# Patient Record
Sex: Female | Born: 1940 | Race: White | Hispanic: No | Smoking: Current some day smoker
Health system: Southern US, Community
[De-identification: ages and names within clinical notes are randomized; demographics above are authoritative.]

## PROBLEM LIST (undated history)

## (undated) DIAGNOSIS — E119 Type 2 diabetes mellitus without complications: Secondary | ICD-10-CM

## (undated) DIAGNOSIS — I10 Essential (primary) hypertension: Secondary | ICD-10-CM

## (undated) DIAGNOSIS — D509 Iron deficiency anemia, unspecified: Secondary | ICD-10-CM

## (undated) DIAGNOSIS — K219 Gastro-esophageal reflux disease without esophagitis: Secondary | ICD-10-CM

## (undated) DIAGNOSIS — J439 Emphysema, unspecified: Secondary | ICD-10-CM

## (undated) DIAGNOSIS — M549 Dorsalgia, unspecified: Secondary | ICD-10-CM

## (undated) DIAGNOSIS — R569 Unspecified convulsions: Secondary | ICD-10-CM

## (undated) DIAGNOSIS — K649 Unspecified hemorrhoids: Secondary | ICD-10-CM

## (undated) DIAGNOSIS — E785 Hyperlipidemia, unspecified: Secondary | ICD-10-CM

## (undated) DIAGNOSIS — J45909 Unspecified asthma, uncomplicated: Secondary | ICD-10-CM

## (undated) DIAGNOSIS — K589 Irritable bowel syndrome without diarrhea: Secondary | ICD-10-CM

## (undated) DIAGNOSIS — IMO0002 Reserved for concepts with insufficient information to code with codable children: Secondary | ICD-10-CM

## (undated) DIAGNOSIS — K31819 Angiodysplasia of stomach and duodenum without bleeding: Secondary | ICD-10-CM

## (undated) DIAGNOSIS — E240 Pituitary-dependent Cushing's disease: Secondary | ICD-10-CM

## (undated) DIAGNOSIS — F419 Anxiety disorder, unspecified: Secondary | ICD-10-CM

## (undated) HISTORY — DX: Gastro-esophageal reflux disease without esophagitis: K21.9

## (undated) HISTORY — PX: APPENDECTOMY: SHX54

## (undated) HISTORY — DX: Iron deficiency anemia, unspecified: D50.9

## (undated) HISTORY — DX: Hyperlipidemia, unspecified: E78.5

## (undated) HISTORY — DX: Reserved for concepts with insufficient information to code with codable children: IMO0002

## (undated) HISTORY — DX: Anxiety disorder, unspecified: F41.9

## (undated) HISTORY — DX: Unspecified hemorrhoids: K64.9

## (undated) HISTORY — PX: OTHER SURGICAL HISTORY: SHX169

## (undated) HISTORY — DX: Emphysema, unspecified: J43.9

## (undated) HISTORY — PX: COLON SURGERY: SHX602

## (undated) HISTORY — DX: Angiodysplasia of stomach and duodenum without bleeding: K31.819

## (undated) HISTORY — DX: Irritable bowel syndrome, unspecified: K58.9

## (undated) HISTORY — PX: ABDOMINAL HYSTERECTOMY: SHX81

---

## 1985-08-28 HISTORY — PX: BRAIN SURGERY: SHX531

## 1999-03-12 ENCOUNTER — Emergency Department (HOSPITAL_COMMUNITY): Admission: EM | Admit: 1999-03-12 | Discharge: 1999-03-12 | Payer: Self-pay | Admitting: Emergency Medicine

## 2000-05-31 ENCOUNTER — Encounter: Admission: RE | Admit: 2000-05-31 | Discharge: 2000-05-31 | Payer: Self-pay | Admitting: *Deleted

## 2000-05-31 ENCOUNTER — Encounter: Payer: Self-pay | Admitting: *Deleted

## 2000-06-25 ENCOUNTER — Ambulatory Visit (HOSPITAL_COMMUNITY): Admission: RE | Admit: 2000-06-25 | Discharge: 2000-06-25 | Payer: Self-pay | Admitting: *Deleted

## 2000-10-30 ENCOUNTER — Encounter: Admission: RE | Admit: 2000-10-30 | Discharge: 2000-10-30 | Payer: Self-pay | Admitting: Family Medicine

## 2000-10-30 ENCOUNTER — Encounter: Payer: Self-pay | Admitting: Family Medicine

## 2002-02-14 ENCOUNTER — Encounter: Payer: Self-pay | Admitting: Family Medicine

## 2002-02-14 ENCOUNTER — Encounter: Admission: RE | Admit: 2002-02-14 | Discharge: 2002-02-14 | Payer: Self-pay | Admitting: Family Medicine

## 2003-03-31 ENCOUNTER — Encounter: Payer: Self-pay | Admitting: Family Medicine

## 2003-03-31 ENCOUNTER — Ambulatory Visit (HOSPITAL_COMMUNITY): Admission: RE | Admit: 2003-03-31 | Discharge: 2003-03-31 | Payer: Self-pay | Admitting: Family Medicine

## 2003-10-15 ENCOUNTER — Encounter: Admission: RE | Admit: 2003-10-15 | Discharge: 2003-10-15 | Payer: Self-pay | Admitting: Family Medicine

## 2003-10-16 ENCOUNTER — Encounter: Admission: RE | Admit: 2003-10-16 | Discharge: 2003-10-16 | Payer: Self-pay | Admitting: Family Medicine

## 2004-06-13 ENCOUNTER — Encounter: Admission: RE | Admit: 2004-06-13 | Discharge: 2004-06-13 | Payer: Self-pay | Admitting: Family Medicine

## 2004-08-03 ENCOUNTER — Encounter: Admission: RE | Admit: 2004-08-03 | Discharge: 2004-08-03 | Payer: Self-pay | Admitting: Family Medicine

## 2004-11-25 ENCOUNTER — Encounter: Admission: RE | Admit: 2004-11-25 | Discharge: 2004-11-25 | Payer: Self-pay | Admitting: Family Medicine

## 2005-05-24 ENCOUNTER — Encounter: Admission: RE | Admit: 2005-05-24 | Discharge: 2005-08-22 | Payer: Self-pay | Admitting: Family Medicine

## 2010-06-09 ENCOUNTER — Observation Stay (HOSPITAL_COMMUNITY)
Admission: EM | Admit: 2010-06-09 | Discharge: 2010-06-10 | Payer: Self-pay | Source: Home / Self Care | Admitting: Emergency Medicine

## 2010-06-10 ENCOUNTER — Encounter (INDEPENDENT_AMBULATORY_CARE_PROVIDER_SITE_OTHER): Payer: Self-pay | Admitting: Emergency Medicine

## 2010-06-10 ENCOUNTER — Ambulatory Visit: Payer: Self-pay | Admitting: Vascular Surgery

## 2010-06-10 ENCOUNTER — Ambulatory Visit: Payer: Self-pay | Admitting: Cardiology

## 2010-09-18 ENCOUNTER — Encounter: Payer: Self-pay | Admitting: Family Medicine

## 2010-11-10 LAB — DIFFERENTIAL
Basophils Absolute: 0 10*3/uL (ref 0.0–0.1)
Basophils Relative: 0 % (ref 0–1)
Eosinophils Absolute: 0.1 10*3/uL (ref 0.0–0.7)
Eosinophils Relative: 1 % (ref 0–5)
Lymphocytes Relative: 30 % (ref 12–46)
Lymphs Abs: 2.6 10*3/uL (ref 0.7–4.0)
Monocytes Absolute: 0.7 10*3/uL (ref 0.1–1.0)
Monocytes Relative: 8 % (ref 3–12)
Neutro Abs: 5.3 10*3/uL (ref 1.7–7.7)
Neutrophils Relative %: 61 % (ref 43–77)

## 2010-11-10 LAB — BASIC METABOLIC PANEL
BUN: 17 mg/dL (ref 6–23)
CO2: 29 mEq/L (ref 19–32)
Calcium: 9.1 mg/dL (ref 8.4–10.5)
Chloride: 105 mEq/L (ref 96–112)
Creatinine, Ser: 1.08 mg/dL (ref 0.4–1.2)
GFR calc Af Amer: >60 mL/min (ref >60)
GFR calc non Af Amer: 50 mL/min — ABNORMAL LOW (ref >60)
Glucose, Bld: 79 mg/dL (ref 70–99)
Potassium: 4.5 mEq/L (ref 3.5–5.1)
Sodium: 141 mEq/L (ref 135–145)

## 2010-11-10 LAB — GLUCOSE, CAPILLARY: Glucose-Capillary: 82 mg/dL (ref 70–99)

## 2010-11-10 LAB — PROTIME-INR
INR: 0.9 (ref 0.00–1.49)
Prothrombin Time: 12.4 seconds (ref 11.6–15.2)

## 2010-11-10 LAB — CBC
HCT: 40.4 % (ref 36.0–46.0)
Hemoglobin: 13.6 g/dL (ref 12.0–15.0)
MCH: 29.9 pg (ref 26.0–34.0)
MCHC: 33.7 g/dL (ref 30.0–36.0)
MCV: 88.8 fL (ref 78.0–100.0)
Platelets: 193 10*3/uL (ref 150–400)
RBC: 4.55 MIL/uL (ref 3.87–5.11)
RDW: 13.5 % (ref 11.5–15.5)
WBC: 8.7 10*3/uL (ref 4.0–10.5)

## 2010-11-10 LAB — POCT CARDIAC MARKERS
CKMB, poc: <1 ng/mL — ABNORMAL LOW (ref 1.0–8.0)
Myoglobin, poc: 78.9 ng/mL (ref 12–200)
Troponin i, poc: <0.05 ng/mL (ref 0.00–0.09)

## 2010-11-10 LAB — CORTISOL: Cortisol, Plasma: 1.5 ug/dL

## 2011-08-19 ENCOUNTER — Emergency Department (HOSPITAL_BASED_OUTPATIENT_CLINIC_OR_DEPARTMENT_OTHER)
Admission: EM | Admit: 2011-08-19 | Discharge: 2011-08-19 | Disposition: A | Discharge status: Home / Self Care | Payer: Medicare Other | Attending: Emergency Medicine | Admitting: Emergency Medicine

## 2011-08-19 ENCOUNTER — Emergency Department (INDEPENDENT_AMBULATORY_CARE_PROVIDER_SITE_OTHER): Payer: Medicare Other

## 2011-08-19 ENCOUNTER — Encounter: Payer: Self-pay | Admitting: *Deleted

## 2011-08-19 DIAGNOSIS — R05 Cough: Secondary | ICD-10-CM

## 2011-08-19 DIAGNOSIS — R197 Diarrhea, unspecified: Secondary | ICD-10-CM | POA: Insufficient documentation

## 2011-08-19 DIAGNOSIS — J40 Bronchitis, not specified as acute or chronic: Secondary | ICD-10-CM | POA: Insufficient documentation

## 2011-08-19 DIAGNOSIS — J45909 Unspecified asthma, uncomplicated: Secondary | ICD-10-CM | POA: Insufficient documentation

## 2011-08-19 DIAGNOSIS — R509 Fever, unspecified: Secondary | ICD-10-CM

## 2011-08-19 DIAGNOSIS — R112 Nausea with vomiting, unspecified: Secondary | ICD-10-CM | POA: Insufficient documentation

## 2011-08-19 HISTORY — DX: Unspecified convulsions: R56.9

## 2011-08-19 HISTORY — DX: Dorsalgia, unspecified: M54.9

## 2011-08-19 LAB — DIFFERENTIAL
Basophils Absolute: 0 10*3/uL (ref 0.0–0.1)
Basophils Relative: 0 % (ref 0–1)
Eosinophils Absolute: 0 10*3/uL (ref 0.0–0.7)
Eosinophils Relative: 0 % (ref 0–5)
Lymphocytes Relative: 13 % (ref 12–46)
Lymphs Abs: 1 10*3/uL (ref 0.7–4.0)
Monocytes Absolute: 1.1 10*3/uL — ABNORMAL HIGH (ref 0.1–1.0)
Monocytes Relative: 14 % — ABNORMAL HIGH (ref 3–12)
Neutro Abs: 5.5 10*3/uL (ref 1.7–7.7)
Neutrophils Relative %: 72 % (ref 43–77)

## 2011-08-19 LAB — URINALYSIS, ROUTINE W REFLEX MICROSCOPIC
Bilirubin Urine: NEGATIVE
Glucose, UA: NEGATIVE mg/dL
Hgb urine dipstick: NEGATIVE
Ketones, ur: NEGATIVE mg/dL
Leukocytes, UA: NEGATIVE
Nitrite: NEGATIVE
Protein, ur: NEGATIVE mg/dL
Specific Gravity, Urine: 1.02 (ref 1.005–1.030)
Urobilinogen, UA: 0.2 mg/dL (ref 0.0–1.0)
pH: 6 (ref 5.0–8.0)

## 2011-08-19 LAB — CBC
HCT: 41 % (ref 36.0–46.0)
Hemoglobin: 14.2 g/dL (ref 12.0–15.0)
MCH: 28.8 pg (ref 26.0–34.0)
MCHC: 34.6 g/dL (ref 30.0–36.0)
MCV: 83.2 fL (ref 78.0–100.0)
Platelets: 184 10*3/uL (ref 150–400)
RBC: 4.93 MIL/uL (ref 3.87–5.11)
RDW: 13.8 % (ref 11.5–15.5)
WBC: 7.6 10*3/uL (ref 4.0–10.5)

## 2011-08-19 MED ORDER — MOXIFLOXACIN HCL 400 MG PO TABS
400.0000 mg | ORAL_TABLET | Freq: Once | ORAL | Status: AC
  Administered 2011-08-19: 400 mg via ORAL
  Filled 2011-08-19: qty 1

## 2011-08-19 MED ORDER — BENZONATATE 100 MG PO CAPS: 100.0000 mg | ORAL_CAPSULE | Freq: Three times a day (TID) | ORAL | Status: AC

## 2011-08-19 MED ORDER — ALBUTEROL SULFATE HFA 108 (90 BASE) MCG/ACT IN AERS
1.0000 | INHALATION_SPRAY | Freq: Four times a day (QID) | RESPIRATORY_TRACT | Status: DC | PRN
  Administered 2011-08-19: 2 via RESPIRATORY_TRACT
  Filled 2011-08-19: qty 6.7

## 2011-08-19 MED ORDER — MOXIFLOXACIN HCL 400 MG PO TABS: 400.0000 mg | ORAL_TABLET | Freq: Every day | ORAL | Status: AC

## 2011-08-19 NOTE — ED Notes (Signed)
EMS transport from home- n/v/d,  Body aches, chills- piv left ac 20g

## 2011-08-19 NOTE — ED Provider Notes (Signed)
History   This chart was scribed for Rolan Bucco, MD scribed by Magnus Sinning. The patient was seen in room MH10/MH10   CSN: [id number]  Arrival date & time 08/19/11  1753   First MD Initiated Contact with Patient 08/19/11 2049      Chief Complaint  Patient presents with  . Nausea  . Emesis  . Generalized Body Aches  . Diarrhea    (Consider location/radiation/quality/duration/timing/severity/associated sxs/prior treatment) HPI Monica Copeland is a 70 y.o. female who presents to the Emergency Department BIB EMS complaining of persistent productive cough onset 4 days ago with associated wheezing, lower back pain,chills, and n/v/d which she states has resolved. Pt reports that she has be unable to eat due to cough and is experiencing associated vomiting and chest soreness also due to cough. Pt denies fever and has been taking cortisone and robitussin w/ no improvement. Pt states that she is has been feeling better since arrival in the ED. Pt has a hx of asthma , but stopped using inhaler a "long time ago."  Past Medical History  Diagnosis Date  . Asthma   . Back pain   . Seizures     Past Surgical History  Procedure Date  . Brain surgery   . Colon surgery   . Abdominal hysterectomy     No family history on file.  History  Substance Use Topics  . Smoking status: Current Everyday Smoker -- 1.0 packs/day    Types: Cigarettes  . Smokeless tobacco: Never Used  . Alcohol Use: No   Review of Systems  Constitutional: Positive for chills. Negative for fever.  Respiratory: Positive for cough and wheezing.   Gastrointestinal: Positive for nausea, vomiting and diarrhea.  Musculoskeletal: Positive for back pain.  All other systems reviewed and are negative.    Allergies  Review of patient's allergies indicates not on file.  Home Medications  No current outpatient prescriptions on file.  BP 123/57  Pulse 81  Temp(Src) 99.5 F (37.5 C) (Oral)  Resp 20  SpO2  100%  Physical Exam  Nursing note and vitals reviewed. Constitutional: She is oriented to person, place, and time. She appears well-developed and well-nourished. No distress.  HENT:  Head: Normocephalic and atraumatic.  Mouth/Throat: Oropharynx is clear and moist.  Eyes: Pupils are equal, round, and reactive to light.  Neck: Normal range of motion. Neck supple.  Cardiovascular: Normal rate, regular rhythm, normal heart sounds and intact distal pulses.   Pulmonary/Chest: No respiratory distress. She has wheezes.       Moderate wheezing bilaterally   Abdominal: Soft. Normal appearance and bowel sounds are normal. She exhibits no distension.  Musculoskeletal: Normal range of motion. She exhibits no edema and no tenderness.  Neurological: She is alert and oriented to person, place, and time. No cranial nerve deficit.  Skin: Skin is warm and dry. No rash noted.  Psychiatric: She has a normal mood and affect. Her behavior is normal.    ED Course  Procedures (including critical care time) DIAGNOSTIC STUDIES: Oxygen Saturation is 98% on room air, normal by my interpretation.    COORDINATION OF CARE:  Labs Reviewed  DIFFERENTIAL - Abnormal; Notable for the following:    Monocytes Relative 14 (*)    Monocytes Absolute 1.1 (*)    All other components within normal limits  CBC  URINALYSIS, ROUTINE W REFLEX MICROSCOPIC   Dg Chest 2 View  08/19/2011  *RADIOLOGY REPORT*  Clinical Data: Cough, fever  CHEST - 2 VIEW  Comparison: 05/03/2007  Findings: Upper-normal size of cardiac silhouette. Calcified tortuous aorta. Pulmonary vascularity normal. Lungs mildly emphysematous but clear. No pleural effusion or pneumothorax. Bones demineralized.  IMPRESSION: Mild emphysematous changes. No acute abnormalities.  Original Report Authenticated By: Lollie Marrow, M.D.     1. Bronchitis       MDM  Well appearing, normal sats, no pneumonia.  Will d/c with bronchitis tx  I personally performed the  services described in this documentation, which was scribed in my presence.  The recorded information has been reviewed and considered.         Rolan Bucco, MD 08/20/11 [phone]

## 2012-01-21 ENCOUNTER — Emergency Department (HOSPITAL_COMMUNITY): Payer: Medicare Other

## 2012-01-21 ENCOUNTER — Inpatient Hospital Stay (HOSPITAL_COMMUNITY)
Admission: EM | Admit: 2012-01-21 | Discharge: 2012-01-25 | DRG: 202 | Disposition: A | Discharge status: Home / Self Care | Payer: Medicare Other | Attending: Internal Medicine | Admitting: Internal Medicine

## 2012-01-21 ENCOUNTER — Encounter (HOSPITAL_COMMUNITY): Payer: Self-pay | Admitting: *Deleted

## 2012-01-21 DIAGNOSIS — M545 Low back pain, unspecified: Secondary | ICD-10-CM

## 2012-01-21 DIAGNOSIS — J45901 Unspecified asthma with (acute) exacerbation: Secondary | ICD-10-CM

## 2012-01-21 DIAGNOSIS — R000 Tachycardia, unspecified: Secondary | ICD-10-CM

## 2012-01-21 DIAGNOSIS — K254 Chronic or unspecified gastric ulcer with hemorrhage: Secondary | ICD-10-CM

## 2012-01-21 DIAGNOSIS — D72829 Elevated white blood cell count, unspecified: Secondary | ICD-10-CM | POA: Diagnosis present

## 2012-01-21 DIAGNOSIS — B3781 Candidal esophagitis: Secondary | ICD-10-CM | POA: Diagnosis present

## 2012-01-21 DIAGNOSIS — R0600 Dyspnea, unspecified: Secondary | ICD-10-CM | POA: Diagnosis present

## 2012-01-21 DIAGNOSIS — I498 Other specified cardiac arrhythmias: Secondary | ICD-10-CM | POA: Diagnosis present

## 2012-01-21 DIAGNOSIS — R1310 Dysphagia, unspecified: Secondary | ICD-10-CM

## 2012-01-21 DIAGNOSIS — F17200 Nicotine dependence, unspecified, uncomplicated: Secondary | ICD-10-CM | POA: Diagnosis present

## 2012-01-21 DIAGNOSIS — M549 Dorsalgia, unspecified: Secondary | ICD-10-CM | POA: Diagnosis present

## 2012-01-21 DIAGNOSIS — D62 Acute posthemorrhagic anemia: Secondary | ICD-10-CM

## 2012-01-21 DIAGNOSIS — K219 Gastro-esophageal reflux disease without esophagitis: Secondary | ICD-10-CM | POA: Diagnosis present

## 2012-01-21 DIAGNOSIS — K259 Gastric ulcer, unspecified as acute or chronic, without hemorrhage or perforation: Secondary | ICD-10-CM | POA: Diagnosis present

## 2012-01-21 DIAGNOSIS — Z9889 Other specified postprocedural states: Secondary | ICD-10-CM

## 2012-01-21 DIAGNOSIS — E785 Hyperlipidemia, unspecified: Secondary | ICD-10-CM | POA: Diagnosis present

## 2012-01-21 DIAGNOSIS — K921 Melena: Secondary | ICD-10-CM

## 2012-01-21 DIAGNOSIS — G8929 Other chronic pain: Secondary | ICD-10-CM | POA: Diagnosis present

## 2012-01-21 LAB — CBC
Hemoglobin: 10.5 g/dL — ABNORMAL LOW (ref 12.0–15.0)
MCH: 28.2 pg (ref 26.0–34.0)
MCHC: 33.5 g/dL (ref 30.0–36.0)
Platelets: 363 10*3/uL (ref 150–400)
RDW: 13.8 % (ref 11.5–15.5)

## 2012-01-21 LAB — COMPREHENSIVE METABOLIC PANEL
ALT: 26 U/L (ref 0–35)
Albumin: 3.2 g/dL — ABNORMAL LOW (ref 3.5–5.2)
Alkaline Phosphatase: 66 U/L (ref 39–117)
Calcium: 8.6 mg/dL (ref 8.4–10.5)
GFR calc Af Amer: 60 mL/min — ABNORMAL LOW (ref >90)
Glucose, Bld: 117 mg/dL — ABNORMAL HIGH (ref 70–99)
Potassium: 4.6 mEq/L (ref 3.5–5.1)
Sodium: 134 mEq/L — ABNORMAL LOW (ref 135–145)
Total Protein: 6.3 g/dL (ref 6.0–8.3)

## 2012-01-21 LAB — URINALYSIS, ROUTINE W REFLEX MICROSCOPIC
Bilirubin Urine: NEGATIVE
Hgb urine dipstick: NEGATIVE
Ketones, ur: NEGATIVE mg/dL
Nitrite: NEGATIVE
Specific Gravity, Urine: 1.016 (ref 1.005–1.030)
pH: 5 (ref 5.0–8.0)

## 2012-01-21 LAB — POCT I-STAT 3, ART BLOOD GAS (G3+)
Bicarbonate: 18.3 mEq/L — ABNORMAL LOW (ref 20.0–24.0)
TCO2: 19 mmol/L (ref 0–100)
pCO2 arterial: 27.6 mmHg — ABNORMAL LOW (ref 35.0–45.0)
pH, Arterial: 7.429 — ABNORMAL HIGH (ref 7.350–7.400)

## 2012-01-21 LAB — POCT I-STAT 3, VENOUS BLOOD GAS (G3P V)
TCO2: 22 mmol/L (ref 0–100)
pCO2, Ven: 43 mmHg — ABNORMAL LOW (ref 45.0–50.0)
pH, Ven: 7.299 (ref 7.250–7.300)
pO2, Ven: 27 mmHg — CL (ref 30.0–45.0)

## 2012-01-21 LAB — DIFFERENTIAL
Basophils Absolute: 0 10*3/uL (ref 0.0–0.1)
Eosinophils Absolute: 0 10*3/uL (ref 0.0–0.7)
Lymphocytes Relative: 13 % (ref 12–46)
Monocytes Absolute: 1.2 10*3/uL — ABNORMAL HIGH (ref 0.1–1.0)
Neutrophils Relative %: 82 % — ABNORMAL HIGH (ref 43–77)

## 2012-01-21 LAB — CARDIAC PANEL(CRET KIN+CKTOT+MB+TROPI): Total CK: 62 U/L (ref 7–177)

## 2012-01-21 LAB — TROPONIN I: Troponin I: <0.3 ng/mL (ref <0.30)

## 2012-01-21 MED ORDER — ONDANSETRON HCL 4 MG/2ML IJ SOLN: 4.0000 mg | Freq: Three times a day (TID) | INTRAMUSCULAR | Status: DC | PRN

## 2012-01-21 MED ORDER — FUROSEMIDE 10 MG/ML IJ SOLN
40.0000 mg | Freq: Once | INTRAMUSCULAR | Status: AC
  Administered 2012-01-21: 40 mg via INTRAVENOUS
  Filled 2012-01-21: qty 4

## 2012-01-21 MED ORDER — DEXTROSE 5 % IV SOLN
500.0000 mg | Freq: Once | INTRAVENOUS | Status: AC
  Administered 2012-01-21: 500 mg via INTRAVENOUS
  Filled 2012-01-21: qty 500

## 2012-01-21 MED ORDER — SODIUM CHLORIDE 0.9 % IV SOLN
INTRAVENOUS | Status: DC
  Administered 2012-01-21: 150 mL/h via INTRAVENOUS

## 2012-01-21 MED ORDER — ONDANSETRON HCL 4 MG/2ML IJ SOLN: 4.0000 mg | Freq: Four times a day (QID) | INTRAMUSCULAR | Status: DC | PRN

## 2012-01-21 MED ORDER — ONDANSETRON HCL 4 MG PO TABS: 4.0000 mg | ORAL_TABLET | Freq: Four times a day (QID) | ORAL | Status: DC | PRN

## 2012-01-21 MED ORDER — IPRATROPIUM BROMIDE 0.02 % IN SOLN
0.5000 mg | Freq: Four times a day (QID) | RESPIRATORY_TRACT | Status: DC
  Administered 2012-01-22: 0.5 mg via RESPIRATORY_TRACT
  Filled 2012-01-21: qty 2.5

## 2012-01-21 MED ORDER — ASPIRIN 325 MG PO TABS
325.0000 mg | ORAL_TABLET | Freq: Once | ORAL | Status: AC
  Administered 2012-01-21: 325 mg via ORAL
  Filled 2012-01-21: qty 1

## 2012-01-21 MED ORDER — LEVALBUTEROL HCL 0.63 MG/3ML IN NEBU
0.6300 mg | INHALATION_SOLUTION | RESPIRATORY_TRACT | Status: DC | PRN
  Filled 2012-01-21: qty 3

## 2012-01-21 MED ORDER — LEVALBUTEROL HCL 0.63 MG/3ML IN NEBU
0.6300 mg | INHALATION_SOLUTION | Freq: Four times a day (QID) | RESPIRATORY_TRACT | Status: DC
  Administered 2012-01-22: 0.63 mg via RESPIRATORY_TRACT
  Filled 2012-01-21 (×7): qty 3

## 2012-01-21 MED ORDER — SODIUM CHLORIDE 0.9 % IV BOLUS (SEPSIS)
1000.0000 mL | Freq: Once | INTRAVENOUS | Status: AC
  Administered 2012-01-21: 1000 mL via INTRAVENOUS

## 2012-01-21 MED ORDER — TRAMADOL HCL ER 100 MG PO TB24: 100.0000 mg | ORAL_TABLET | Freq: Every day | ORAL | Status: DC | PRN

## 2012-01-21 MED ORDER — IOHEXOL 350 MG/ML SOLN
80.0000 mL | Freq: Once | INTRAVENOUS | Status: AC | PRN
  Administered 2012-01-21: 80 mL via INTRAVENOUS

## 2012-01-21 MED ORDER — METHYLPREDNISOLONE SODIUM SUCC 125 MG IJ SOLR
60.0000 mg | Freq: Four times a day (QID) | INTRAMUSCULAR | Status: DC
  Administered 2012-01-22 (×2): 60 mg via INTRAVENOUS
  Filled 2012-01-21 (×5): qty 0.96
  Filled 2012-01-21 (×2): qty 2

## 2012-01-21 MED ORDER — SODIUM CHLORIDE 0.9 % IJ SOLN
3.0000 mL | Freq: Two times a day (BID) | INTRAMUSCULAR | Status: DC
  Administered 2012-01-21 – 2012-01-25 (×8): 3 mL via INTRAVENOUS

## 2012-01-21 MED ORDER — METHYLPREDNISOLONE SODIUM SUCC 125 MG IJ SOLR
125.0000 mg | Freq: Once | INTRAMUSCULAR | Status: AC
  Administered 2012-01-21: 125 mg via INTRAVENOUS
  Filled 2012-01-21: qty 2

## 2012-01-21 MED ORDER — TRAMADOL HCL 50 MG PO TABS
50.0000 mg | ORAL_TABLET | Freq: Two times a day (BID) | ORAL | Status: DC | PRN
  Filled 2012-01-21: qty 1

## 2012-01-21 MED ORDER — DEXTROSE 5 % IV SOLN
1.0000 g | Freq: Once | INTRAVENOUS | Status: AC
  Administered 2012-01-21: 1 g via INTRAVENOUS
  Filled 2012-01-21: qty 10

## 2012-01-21 MED ORDER — ACETAMINOPHEN 650 MG RE SUPP: 650.0000 mg | Freq: Four times a day (QID) | RECTAL | Status: DC | PRN

## 2012-01-21 MED ORDER — ALUM & MAG HYDROXIDE-SIMETH 200-200-20 MG/5ML PO SUSP: 30.0000 mL | Freq: Four times a day (QID) | ORAL | Status: DC | PRN

## 2012-01-21 MED ORDER — ALBUTEROL (5 MG/ML) CONTINUOUS INHALATION SOLN
10.0000 mg/h | INHALATION_SOLUTION | Freq: Once | RESPIRATORY_TRACT | Status: AC
  Administered 2012-01-21: 10 mg/h via RESPIRATORY_TRACT
  Filled 2012-01-21: qty 20

## 2012-01-21 MED ORDER — HYDROCODONE-ACETAMINOPHEN 5-325 MG PO TABS
1.0000 | ORAL_TABLET | Freq: Four times a day (QID) | ORAL | Status: DC | PRN
  Administered 2012-01-22 – 2012-01-23 (×2): 1 via ORAL
  Filled 2012-01-21 (×2): qty 1

## 2012-01-21 MED ORDER — GUAIFENESIN-DM 100-10 MG/5ML PO SYRP: 5.0000 mL | ORAL_SOLUTION | ORAL | Status: DC | PRN

## 2012-01-21 MED ORDER — ALBUTEROL SULFATE (5 MG/ML) 0.5% IN NEBU
5.0000 mg | INHALATION_SOLUTION | RESPIRATORY_TRACT | Status: DC
  Administered 2012-01-21: 5 mg via RESPIRATORY_TRACT
  Filled 2012-01-21: qty 1

## 2012-01-21 MED ORDER — BUDESONIDE-FORMOTEROL FUMARATE 160-4.5 MCG/ACT IN AERO
2.0000 | INHALATION_SPRAY | Freq: Two times a day (BID) | RESPIRATORY_TRACT | Status: DC
  Administered 2012-01-22 – 2012-01-25 (×8): 2 via RESPIRATORY_TRACT
  Filled 2012-01-21 (×2): qty 6

## 2012-01-21 MED ORDER — ZONISAMIDE 100 MG PO CAPS
300.0000 mg | ORAL_CAPSULE | Freq: Every day | ORAL | Status: DC
  Administered 2012-01-22 – 2012-01-25 (×5): 300 mg via ORAL
  Filled 2012-01-21 (×6): qty 3

## 2012-01-21 MED ORDER — IPRATROPIUM BROMIDE 0.02 % IN SOLN
0.5000 mg | RESPIRATORY_TRACT | Status: AC
  Administered 2012-01-21 (×3): 0.5 mg via RESPIRATORY_TRACT
  Filled 2012-01-21: qty 7.5

## 2012-01-21 MED ORDER — ACETAMINOPHEN 325 MG PO TABS
650.0000 mg | ORAL_TABLET | Freq: Four times a day (QID) | ORAL | Status: DC | PRN
Start: 2012-01-21 — End: 2012-01-25

## 2012-01-21 MED ORDER — EZETIMIBE-SIMVASTATIN 10-40 MG PO TABS
1.0000 | ORAL_TABLET | Freq: Every day | ORAL | Status: DC
  Administered 2012-01-22 – 2012-01-24 (×4): 1 via ORAL
  Filled 2012-01-21 (×7): qty 1

## 2012-01-21 MED ORDER — MOXIFLOXACIN HCL IN NACL 400 MG/250ML IV SOLN
400.0000 mg | INTRAVENOUS | Status: DC
  Administered 2012-01-21 – 2012-01-24 (×4): 400 mg via INTRAVENOUS
  Filled 2012-01-21 (×5): qty 250

## 2012-01-21 NOTE — ED Provider Notes (Signed)
I saw and evaluated the patient, reviewed the resident's note and I agree with the findings and plan.   .Face to face Exam:  General:  Awake HEENT:  Atraumatic Resp: Wheezing Abd:  Nondistended Neuro:No focal weakness Lymph: No adenopathy   Nelia Shi, MD 01/21/12 1950

## 2012-01-21 NOTE — H&P (Signed)
History and Physical       Hospital Admission Note Date: 01/21/2012  Patient name: TALAJAH SLIMP Medical record number: [id number] Date of birth: 12-09-1940 Age: 71 y.o. 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 History was obtained from the patient and her daughter present in the room. Patient lives alone at home and states that she has been getting dyspneic in the last 6 months, worse in the last 3 weeks. She states that over the last few days she was having productive cough with whitish sputum and dyspnea with wheezing. She also had some chest tightness earlier today lasting for 5 minutes with radiation to the left arm. Otherwise she denied any diaphoresis nausea or vomiting. Patient states that for last 2- 3 days she has noticed elevated heart rate with activity. In the ED patient received multiple albuterol breathing treatments and was noticed to have sinus tachycardia with a heart rate of 130s. At the time of my encounter the patient denied any chest tightness or pain. She denied any fevers or chills.   Review of Systems:  Constitutional: Denies fever, chills, diaphoresis, appetite change and fatigue.  HEENT: Denies photophobia, eye pain, redness, hearing loss, ear pain, congestion, sore throat, rhinorrhea, sneezing, mouth sores, trouble swallowing, neck pain, neck stiffness and tinnitus.   Respiratory: Please see history of present illness    Cardiovascular: Please see history of present illness   Gastrointestinal: Denies nausea, vomiting, abdominal pain, diarrhea, constipation, blood in stool and abdominal distention.    Genitourinary: Denies dysuria, urgency, frequency, hematuria, flank pain and difficulty urinating.  Musculoskeletal: Denies myalgias, +chronic back pain,denies any joint swelling, arthralgias and gait problem.  Skin: Denies pallor, rash and wound.  Neurological: Denies dizziness, seizures, syncope, weakness, light-headedness, numbness and headaches.  Hematological: Denies adenopathy. Easy bruising, personal or family bleeding history  Psychiatric/Behavioral: Denies suicidal ideation, mood changes, confusion, nervousness, sleep disturbance and agitation  Past Medical History: Past Medical History  Diagnosis Date  . Asthma   . Back pain   . Seizures    Past Surgical History  Procedure Date  . Brain surgery   . Colon surgery   . Abdominal hysterectomy     Medications: Prior to Admission medications   Medication Sig Start Date End Date Taking? Authorizing Provider  albuterol (PROVENTIL HFA;VENTOLIN HFA) 108 (90 BASE) MCG/ACT inhaler Inhale 2 puffs into the lungs every 6 (six) hours as needed. For shortness of breath   Yes Historical Provider, MD  albuterol (PROVENTIL) (2.5 MG/3ML) 0.083% nebulizer solution Take 2.5 mg by nebulization every 6 (six) hours as needed. For shortness of breath   Yes Historical Provider, MD  budesonide-formoterol (SYMBICORT) 160-4.5 MCG/ACT inhaler Inhale 2 puffs into the lungs 2 (two) times daily.   Yes Historical Provider, MD  cortisone (CORTONE) 25 MG tablet Take 37.5 mg by mouth daily. Take 1 tablet in the morning and 0.5 tablet in the afternoon   Yes Historical Provider, MD  ezetimibe-simvastatin (VYTORIN) 10-40 MG per tablet Take 1 tablet by mouth at bedtime.   Yes Historical Provider, MD  traMADol (ULTRAM-ER) 100 MG 24 hr tablet Take 100 mg by mouth daily as needed. For headache or back pain   Yes Historical  Provider, MD  zonisamide (ZONEGRAN) 100 MG capsule Take 300 mg by mouth daily. Take 1 capsule in the morning and 2 capsules in the evening   Yes  Historical Provider, MD    Allergies:   Allergies  Allergen Reactions  . Sleep Aid (Diphenhydramine) Other (See Comments)    Pt experiences reverse reaction: lots of energy instead of sedative effect  . Tomato Rash    Social History:  reports that she has been smoking Cigarettes.  She has been smoking about 1 pack per day, states that she has quit smoking one month ago. She has never used smokeless tobacco. She reports that she does not drink alcohol or use illicit drugs.  Family History: History reviewed. No pertinent family history.  Physical Exam: Blood pressure 128/60, pulse 132, temperature 97.8 F (36.6 C), temperature source Oral, resp. rate 20, SpO2 100.00%. General: Alert, awake, oriented x3, in no acute distress. HEENT: anicteric sclera, pink conjunctiva, pupils equal and reactive to light and accomodation Neck: supple, no masses or lymphadenopathy, no goiter, no bruits  Heart:  sinus tachycardia Regular rate and rhythm, without murmurs, rubs or gallops. Lungs:  diffuse wheezing bilaterally  Abdomen: Soft, nontender, nondistended, positive bowel sounds, no masses. Extremities: No clubbing, cyanosis or edema with positive pedal pulses. Neuro: Grossly intact, no focal neurological deficits, strength 5/5 upper and lower extremities bilaterally Psych: alert and oriented x 3, normal mood and affect Skin: no rashes or lesions, warm and dry   LABS on Admission:  Basic Metabolic Panel:  Lab 01/21/12 1610  NA 134*  K 4.6  CL 101  CO2 20  GLUCOSE 117*  BUN 65*  CREATININE 1.06  CALCIUM 8.6  MG --  PHOS --   Liver Function Tests:  Lab 01/21/12 1552  AST 20  ALT 26  ALKPHOS 66  BILITOT 0.2*  PROT 6.3  ALBUMIN 3.2*   CBC:  Lab 01/21/12 1552  WBC 23.4*  NEUTROABS 19.2*  HGB 10.5*  HCT 31.3*  MCV 83.9  PLT 363   Cardiac Enzymes:  Lab 01/21/12 1553  CKTOTAL --  CKMB --  CKMBINDEX --  TROPONINI <0.30   BNP: No components found with this basename:  POCBNP:2 CBG: No results found for this basename: GLUCAP:2 in the last 168 hours   Radiological Exams on Admission: Ct Angio Chest W/cm &/or Wo Cm  01/21/2012.  IMPRESSION: No evidence for pneumonia or pulmonary emboli.  Mild coronary artery calcification.  Slight mucus plugging right lower lobe bronchus.  Original Report Authenticated By: Elsie Stain, M.D.   Dg Chest Portable 1 View  01/21/2012  *RADIOLOGY REPORT*  Clinical Data: Shortness of breath and cough.  Tightness in the chest.  PORTABLE CHEST - 1 VIEW  Comparison: 08/19/2011  Findings: Patient is slightly rotated.  Heart size is probably upper limits normal.  There are no focal consolidations or pleural effusions.  No edema.  IMPRESSION: No evidence for acute cardiopulmonary abnormality.  Original Report Authenticated By: Patterson Hammersmith, M.D.    Assessment/Plan Present on Admission:   . acute Asthma exacerbation - Admit to telemetry, placed on Xopenex and Atrovent breathing treatments, O2 supplementation via nasal cannula  - Place on Solu-Medrol IV, Symbicort, IV Avelox, incentive spirometry  - Will place her back on her maintenance steroids once the acute event/asthma exacerbation has resolved   .Tachycardia sinus: Probably worsened due to albuterol nebs, patient also had transient chest tightness with the elevated BNP (received IV fluids in ED), no prior history of  coronary artery disease /CHF. PE ruled out on CT angiogram of the chest. - Obtain cardiac enzymes, one dose of Lasix, 2-D echocardiogram for further workup, TSH  .Chronic back pain: Continue tramadol and Percocet  PRN   .Hyperlipidemia: Continue statins   . history of brain surgery with chronic corticosteroid use: Will continue IV Solu-Medrol, placed back on maintenance sure it is once acute asthma resolved  DVT prophylaxis: SCDs  CODE STATUS: Discussed in detail with the patient, she opted to be full CODE STATUS  Further plan will depend as patient's  clinical course evolves and further radiologic and laboratory data become available.   @Time  Spent on Admission: 1 hour  Hajra Port M.D. [facility] 01/21/2012, 7:46 PM

## 2012-01-21 NOTE — ED Notes (Signed)
EMS-pt reports productive cough x unknown amount of time (?xmas), pt states she in on her 3rd round of antibiotics. Pt reports chronic headaches(x 1 month). 12lead sinus tach. Wheezing upper fields, (pt took home neb tx this am), pt given 5mg  of albuterol en route. 20g(L)AC.

## 2012-01-21 NOTE — ED Notes (Signed)
Pt helped to bedpan, heart rate increased to 130 and pt very sob.

## 2012-01-21 NOTE — ED Notes (Signed)
Attempted to call report. RN unable to take at this time. 

## 2012-01-21 NOTE — ED Notes (Signed)
RES aware of heart rate

## 2012-01-21 NOTE — ED Notes (Signed)
Pt helped to bedside commode, pts heart rate to 150, pt reports increase in sob, pt came clammy, pts heart rate back 120 within 30seconds of resting. Blood pressure 139/71.

## 2012-01-21 NOTE — ED Provider Notes (Signed)
History     CSN: [id number]  Arrival date & time 01/21/12  1519   First MD Initiated Contact with Patient 01/21/12 1528      Chief Complaint  Patient presents with  . Shortness of Breath  . Cough    (Consider location/radiation/quality/duration/timing/severity/associated sxs/prior treatment) HPI Comments: Hx of asthma.  Productive cough and dyspnea worsened over last few days.  This is worse than typical asthma per pt.  Patient is a 71 y.o. female presenting with cough. The history is provided by the patient.  Cough This is a recurrent problem. Episode onset: months -worse today. The problem occurs constantly. The problem has been gradually worsening. The cough is productive of sputum. There has been no fever. Associated symptoms include chest pain (5 minutes of chest tightness earlier today - now resolved), shortness of breath and wheezing. Pertinent negatives include no sore throat. Treatments tried: albuterol. The treatment provided mild relief. She is not a smoker. Her past medical history is significant for pneumonia.    Past Medical History  Diagnosis Date  . Asthma   . Back pain   . Seizures     Past Surgical History  Procedure Date  . Brain surgery   . Colon surgery   . Abdominal hysterectomy     History reviewed. No pertinent family history.  History  Substance Use Topics  . Smoking status: Current Everyday Smoker -- 1.0 packs/day    Types: Cigarettes  . Smokeless tobacco: Never Used  . Alcohol Use: No    OB History    Grav Para Term Preterm Abortions TAB SAB Ect Mult Living                  Review of Systems  Constitutional: Negative for activity change.  HENT: Negative for congestion and sore throat.   Eyes: Negative for visual disturbance.  Respiratory: Positive for cough, shortness of breath and wheezing. Negative for chest tightness.   Cardiovascular: Positive for chest pain (5 minutes of chest tightness earlier today - now resolved). Negative  for leg swelling.  Gastrointestinal: Negative for abdominal pain.  Genitourinary: Negative for dysuria.  Skin: Negative for rash.  Neurological: Negative for syncope.  Psychiatric/Behavioral: Negative for behavioral problems.    Allergies  Sleep aid and Tomato  Home Medications   Current Outpatient Rx  Name Route Sig Dispense Refill  . ALBUTEROL SULFATE HFA 108 (90 BASE) MCG/ACT IN AERS Inhalation Inhale 2 puffs into the lungs every 6 (six) hours as needed. For shortness of breath    . ALBUTEROL SULFATE (2.5 MG/3ML) 0.083% IN NEBU Nebulization Take 2.5 mg by nebulization every 6 (six) hours as needed. For shortness of breath    . BUDESONIDE-FORMOTEROL FUMARATE 160-4.5 MCG/ACT IN AERO Inhalation Inhale 2 puffs into the lungs 2 (two) times daily.    . CORTISONE ACETATE 25 MG PO TABS Oral Take 37.5 mg by mouth daily. Take 1 tablet in the morning and 0.5 tablet in the afternoon    . EZETIMIBE-SIMVASTATIN 10-40 MG PO TABS Oral Take 1 tablet by mouth at bedtime.    . TRAMADOL HCL ER 100 MG PO TB24 Oral Take 100 mg by mouth daily as needed. For headache or back pain    . ZONISAMIDE 100 MG PO CAPS Oral Take 300 mg by mouth daily. Take 1 capsule in the morning and 2 capsules in the evening      BP 128/60  Pulse 132  Temp(Src) 97.8 F (36.6 C) (Oral)  Resp 20  SpO2 100%  Physical Exam  Constitutional: She is oriented to person, place, and time. She appears well-developed and well-nourished.  HENT:  Head: Normocephalic and atraumatic.  Eyes: Conjunctivae and EOM are normal. Pupils are equal, round, and reactive to light. No scleral icterus.  Neck: Normal range of motion. Neck supple.  Cardiovascular: Regular rhythm.  Exam reveals no gallop and no friction rub.   No murmur heard.      tachy  Pulmonary/Chest: Effort normal. No respiratory distress. She has wheezes (mild). She has no rales (moderate LLL). She exhibits no tenderness.  Abdominal: Soft. She exhibits no distension and no mass.  There is no tenderness. There is no rebound and no guarding.  Musculoskeletal: Normal range of motion.  Neurological: She is alert and oriented to person, place, and time. She has normal reflexes. No cranial nerve deficit.  Skin: Skin is warm and dry. No rash noted.  Psychiatric: She has a normal mood and affect. Her behavior is normal. Judgment and thought content normal.    ED Course  Procedures (including critical care time)   Date: 01/21/2012  Rate: 129  Rhythm: sinus tach  QRS Axis: normal  Intervals: normal  ST/T Wave abnormalities: normal  Conduction Disutrbances:none  Narrative Interpretation:   Old EKG Reviewed: changes noted    Labs Reviewed  CBC - Abnormal; Notable for the following:    WBC 23.4 (*)    RBC 3.73 (*)    Hemoglobin 10.5 (*)    HCT 31.3 (*)    All other components within normal limits  DIFFERENTIAL - Abnormal; Notable for the following:    Neutrophils Relative 82 (*)    Neutro Abs 19.2 (*)    Monocytes Absolute 1.2 (*)    All other components within normal limits  COMPREHENSIVE METABOLIC PANEL - Abnormal; Notable for the following:    Sodium 134 (*)    Glucose, Bld 117 (*)    BUN 65 (*)    Albumin 3.2 (*)    Total Bilirubin 0.2 (*)    GFR calc non Af Amer 52 (*)    GFR calc Af Amer 60 (*)    All other components within normal limits  LACTIC ACID, PLASMA - Abnormal; Notable for the following:    Lactic Acid, Venous 2.9 (*)    All other components within normal limits  PRO B NATRIURETIC PEPTIDE - Abnormal; Notable for the following:    Pro B Natriuretic peptide (BNP) 1017.0 (*)    All other components within normal limits  POCT I-STAT 3, BLOOD GAS (G3+) - Abnormal; Notable for the following:    pH, Arterial 7.429 (*)    pCO2 arterial 27.6 (*)    pO2, Arterial 194.0 (*)    Bicarbonate 18.3 (*)    Acid-base deficit 5.0 (*)    All other components within normal limits  POCT I-STAT 3, BLOOD GAS (G3P V) - Abnormal; Notable for the following:      pCO2, Ven 43.0 (*)    pO2, Ven 27.0 (*)    Acid-base deficit 5.0 (*)    All other components within normal limits  URINALYSIS, ROUTINE W REFLEX MICROSCOPIC  TROPONIN I  CULTURE, BLOOD (ROUTINE X 2)  CULTURE, BLOOD (ROUTINE X 2)  URINE CULTURE   Ct Angio Chest W/cm &/or Wo Cm  01/21/2012  *RADIOLOGY REPORT*  Clinical Data: Productive cough for weeks or months.  Chronic headaches.  Wheezing.  CT ANGIOGRAPHY CHEST  Technique:  Multidetector CT imaging of the chest using  the standard protocol during bolus administration of intravenous contrast. Multiplanar reconstructed images including MIPs were obtained and reviewed to evaluate the vascular anatomy.  Contrast: 80mL OMNIPAQUE IOHEXOL 350 MG/ML SOLN  Comparison: Chest x-ray 01/21/2012  Findings:  Normal heart and great vessels. Mild transverse aortic arch calcification.  Slight left main coronary artery calcification.  No pulmonary emboli.  No pleural or pericardial effusion.  No pulmonary nodules.  No hilar or mediastinal adenopathy.  Midline trachea and normal bronchi. Slight mucous plug right lower lobe bronchus (image 169, series 8). No acute osseous findings.  Visualized upper abdominal structures unremarkable.  IMPRESSION: No evidence for pneumonia or pulmonary emboli.  Mild coronary artery calcification.  Slight mucus plugging right lower lobe bronchus.  Original Report Authenticated By: Elsie Stain, M.D.   Dg Chest Portable 1 View  01/21/2012  *RADIOLOGY REPORT*  Clinical Data: Shortness of breath and cough.  Tightness in the chest.  PORTABLE CHEST - 1 VIEW  Comparison: 08/19/2011  Findings: Patient is slightly rotated.  Heart size is probably upper limits normal.  There are no focal consolidations or pleural effusions.  No edema.  IMPRESSION: No evidence for acute cardiopulmonary abnormality.  Original Report Authenticated By: Patterson Hammersmith, M.D.     1. Asthma exacerbation   2. Tachycardia       MDM  Hx of asthma.  Productive  cough and dyspnea worsened over last few days.  This is worse than typical asthma per pt.  Tachycardic and tachypneic on exam.  Initial concern for PNA given intense coughing in room.  Gave fluids and treated empirically for CAP given SIRS criteria on initial exam.  CXR without PNA.  PE study without PE.  Have fluids with some improvement in tachycardia but pt persistently tachy to 120.  Low suspicion for ACS given atypical presentation but gave ASA and will trend troponins.  Gave albuterol, ipratropium, solumedrol and afterwards pt wheezing improved and satting well on room air.  Given persistent tachycardia will admit for hydration and further monitoring.  D/w hospitalist - comfortable at this time.  Given clear CT and pt improvement do not feel empiric antibiotics previously given need to be continued for PNA.        Army Chaco, MD 01/21/12 1911

## 2012-01-21 NOTE — ED Notes (Signed)
RT aware of breathing tx.

## 2012-01-22 DIAGNOSIS — R000 Tachycardia, unspecified: Secondary | ICD-10-CM

## 2012-01-22 DIAGNOSIS — J45901 Unspecified asthma with (acute) exacerbation: Secondary | ICD-10-CM

## 2012-01-22 DIAGNOSIS — M545 Low back pain: Secondary | ICD-10-CM

## 2012-01-22 LAB — CARDIAC PANEL(CRET KIN+CKTOT+MB+TROPI)
CK, MB: 4 ng/mL (ref 0.3–4.0)
Relative Index: INVALID (ref 0.0–2.5)
Relative Index: INVALID (ref 0.0–2.5)
Total CK: 45 U/L (ref 7–177)
Troponin I: <0.3 ng/mL (ref <0.30)

## 2012-01-22 LAB — BASIC METABOLIC PANEL
CO2: 18 mEq/L — ABNORMAL LOW (ref 19–32)
GFR calc non Af Amer: 55 mL/min — ABNORMAL LOW (ref >90)
Glucose, Bld: 143 mg/dL — ABNORMAL HIGH (ref 70–99)
Potassium: 3.7 mEq/L (ref 3.5–5.1)
Sodium: 135 mEq/L (ref 135–145)

## 2012-01-22 LAB — CBC
Hemoglobin: 8.9 g/dL — ABNORMAL LOW (ref 12.0–15.0)
MCH: 28.6 pg (ref 26.0–34.0)
RBC: 3.11 MIL/uL — ABNORMAL LOW (ref 3.87–5.11)

## 2012-01-22 LAB — HEMOGLOBIN AND HEMATOCRIT, BLOOD
HCT: 23.3 % — ABNORMAL LOW (ref 36.0–46.0)
Hemoglobin: 7.4 g/dL — ABNORMAL LOW (ref 12.0–15.0)

## 2012-01-22 LAB — TSH: TSH: 0.613 u[IU]/mL (ref 0.350–4.500)

## 2012-01-22 MED ORDER — ALPRAZOLAM 0.5 MG PO TABS
0.5000 mg | ORAL_TABLET | Freq: Two times a day (BID) | ORAL | Status: DC
  Administered 2012-01-22 – 2012-01-25 (×7): 0.5 mg via ORAL
  Filled 2012-01-22 (×7): qty 1

## 2012-01-22 MED ORDER — METHYLPREDNISOLONE SODIUM SUCC 40 MG IJ SOLR
40.0000 mg | Freq: Three times a day (TID) | INTRAMUSCULAR | Status: DC
  Administered 2012-01-22 – 2012-01-23 (×3): 40 mg via INTRAVENOUS
  Filled 2012-01-22 (×6): qty 1

## 2012-01-22 MED ORDER — SENNOSIDES-DOCUSATE SODIUM 8.6-50 MG PO TABS
1.0000 | ORAL_TABLET | Freq: Every day | ORAL | Status: DC
  Administered 2012-01-22 – 2012-01-25 (×2): 1 via ORAL
  Filled 2012-01-22 (×4): qty 1

## 2012-01-22 MED ORDER — LEVALBUTEROL HCL 0.63 MG/3ML IN NEBU
0.6300 mg | INHALATION_SOLUTION | Freq: Three times a day (TID) | RESPIRATORY_TRACT | Status: DC
  Administered 2012-01-22 – 2012-01-23 (×4): 0.63 mg via RESPIRATORY_TRACT
  Filled 2012-01-22 (×9): qty 3

## 2012-01-22 MED ORDER — PANTOPRAZOLE SODIUM 40 MG PO TBEC
40.0000 mg | DELAYED_RELEASE_TABLET | Freq: Every day | ORAL | Status: DC
  Administered 2012-01-22 – 2012-01-23 (×2): 40 mg via ORAL
  Filled 2012-01-22 (×2): qty 1

## 2012-01-22 MED ORDER — IPRATROPIUM BROMIDE 0.02 % IN SOLN
0.5000 mg | Freq: Three times a day (TID) | RESPIRATORY_TRACT | Status: DC
  Administered 2012-01-22 – 2012-01-23 (×4): 0.5 mg via RESPIRATORY_TRACT
  Filled 2012-01-22 (×4): qty 2.5

## 2012-01-22 NOTE — Progress Notes (Signed)
Pt passing dark tarry stools with strong odor.  MD has been made aware.  New orders given.  Will continue to monitor. Nino Glow RN

## 2012-01-22 NOTE — Progress Notes (Signed)
Patient ID: Monica Copeland  female  ZOX:[id number]    DOB: 1941-05-06    DOA: 01/21/2012  PCP: Aura Dials, MD, MD  Subjective: Breathing and wheezing improving, very anxious today  Objective: Weight change:   Intake/Output Summary (Last 24 hours) at 01/22/12 1224 Last data filed at 01/22/12 1037  Gross per 24 hour  Intake    658 ml  Output   2750 ml  Net  -2092 ml   Blood pressure 126/63, pulse 107, temperature 97.3 F (36.3 C), temperature source Oral, resp. rate 18, height 5' (1.524 m), weight 64.229 kg (141 lb 9.6 oz), SpO2 100.00%.  Physical Exam: General: Alert and awake, oriented x3, not in any acute distress. HEENT: anicteric sclera, pupils reactive to light and accommodation, EOMI CVS: S1-S2 clear, no murmur rubs or gallops Chest: Mild scattered wheezing bilaterally, significantly improved from yesterday  Abdomen: soft nontender, nondistended, normal bowel sounds, no organomegaly Extremities: no cyanosis, clubbing or edema noted bilaterally Neuro: Cranial nerves II-XII intact, no focal neurological deficits  Lab Results: Basic Metabolic Panel:  Lab 01/22/12 8119 01/21/12 1552  NA 135 134*  K 3.7 4.6  CL 103 101  CO2 18* 20  GLUCOSE 143* 117*  BUN 68* 65*  CREATININE 1.00 1.06  CALCIUM 8.0* 8.6  MG -- --  PHOS -- --   Liver Function Tests:  Lab 01/21/12 1552  AST 20  ALT 26  ALKPHOS 66  BILITOT 0.2*  PROT 6.3  ALBUMIN 3.2*   CBC:  Lab 01/22/12 0323 01/21/12 1552  WBC 22.5* 23.4*  NEUTROABS -- 19.2*  HGB 8.9* 10.5*  HCT 25.7* 31.3*  MCV 82.6 83.9  PLT 290 363   Cardiac Enzymes:  Lab 01/22/12 0831 01/22/12 0322 01/21/12 2107  CKTOTAL 45 51 62  CKMB 4.0 4.1* 3.4  CKMBINDEX -- -- --  TROPONINI <0.30 <0.30 <0.30   BNP: No components found with this basename: POCBNP:2 CBG: No results found for this basename: GLUCAP:5 in the last 168 hours   Micro Results: Recent Results (from the past 240 hour(s))  CULTURE, BLOOD (ROUTINE X 2)      Status: Normal (Preliminary result)   Collection Time   01/21/12  3:50 PM      Component Value Range Status Comment   Specimen Description BLOOD RIGHT ARM   Final    Special Requests     Final    Value: BOTTLES DRAWN AEROBIC AND ANAEROBIC 10CC BLUE 5CC RED   Culture  Setup Time [id number]   Final    Culture     Final    Value:        BLOOD CULTURE RECEIVED NO GROWTH TO DATE CULTURE WILL BE HELD FOR 5 DAYS BEFORE ISSUING A FINAL NEGATIVE REPORT   Report Status PENDING   Incomplete   CULTURE, BLOOD (ROUTINE X 2)     Status: Normal (Preliminary result)   Collection Time   01/21/12  4:00 PM      Component Value Range Status Comment   Specimen Description BLOOD LEFT HAND   Final    Special Requests     Final    Value: BOTTLES DRAWN AEROBIC AND ANAEROBIC 10CC BLUE 7CC RED   Culture  Setup Time [id number]   Final    Culture     Final    Value:        BLOOD CULTURE RECEIVED NO GROWTH TO DATE CULTURE WILL BE HELD FOR 5 DAYS BEFORE ISSUING A FINAL NEGATIVE REPORT  Report Status PENDING   Incomplete     Studies/Results: Ct Angio Chest W/cm &/or Wo Cm  01/21/2012  *RADIOLOGY REPORT*  Clinical Data: Productive cough for weeks or months.  Chronic headaches.  Wheezing.  CT ANGIOGRAPHY CHEST  Technique:  Multidetector CT imaging of the chest using the standard protocol during bolus administration of intravenous contrast. Multiplanar reconstructed images including MIPs were obtained and reviewed to evaluate the vascular anatomy.  Contrast: 80mL OMNIPAQUE IOHEXOL 350 MG/ML SOLN  Comparison: Chest x-ray 01/21/2012  Findings:  Normal heart and great vessels. Mild transverse aortic arch calcification.  Slight left main coronary artery calcification.  No pulmonary emboli.  No pleural or pericardial effusion.  No pulmonary nodules.  No hilar or mediastinal adenopathy.  Midline trachea and normal bronchi. Slight mucous plug right lower lobe bronchus (image 169, series 8). No acute osseous findings.  Visualized  upper abdominal structures unremarkable.  IMPRESSION: No evidence for pneumonia or pulmonary emboli.  Mild coronary artery calcification.  Slight mucus plugging right lower lobe bronchus.  Original Report Authenticated By: Elsie Stain, M.D.   Dg Chest Portable 1 View  01/21/2012  *RADIOLOGY REPORT*  Clinical Data: Shortness of breath and cough.  Tightness in the chest.  PORTABLE CHEST - 1 VIEW  Comparison: 08/19/2011  Findings: Patient is slightly rotated.  Heart size is probably upper limits normal.  There are no focal consolidations or pleural effusions.  No edema.  IMPRESSION: No evidence for acute cardiopulmonary abnormality.  Original Report Authenticated By: Patterson Hammersmith, M.D.    Medications: Scheduled Meds:   . albuterol  10 mg/hr Nebulization Once  . ALPRAZolam  0.5 mg Oral BID  . aspirin  325 mg Oral Once  . azithromycin  500 mg Intravenous Once  . budesonide-formoterol  2 puff Inhalation BID  . cefTRIAXone (ROCEPHIN)  IV  1 g Intravenous Once  . ezetimibe-simvastatin  1 tablet Oral QHS  . furosemide  40 mg Intravenous Once  . ipratropium  0.5 mg Nebulization Q30 min  . ipratropium  0.5 mg Nebulization TID  . levalbuterol  0.63 mg Nebulization TID  . methylPREDNISolone (SOLU-MEDROL) injection  125 mg Intravenous Once  . methylPREDNISolone (SOLU-MEDROL) injection  40 mg Intravenous Q8H  . moxifloxacin  400 mg Intravenous Q24H  . pantoprazole  40 mg Oral Q0600  . sodium chloride  1,000 mL Intravenous Once  . sodium chloride  1,000 mL Intravenous Once  . sodium chloride  3 mL Intravenous Q12H  . zonisamide  300 mg Oral Daily  . DISCONTD: sodium chloride   Intravenous STAT  . DISCONTD: albuterol  5 mg Nebulization Q4H  . DISCONTD: ipratropium  0.5 mg Nebulization Q6H  . DISCONTD: levalbuterol  0.63 mg Nebulization Q6H  . DISCONTD: methylPREDNISolone (SOLU-MEDROL) injection  60 mg Intravenous Q6H   Continuous Infusions:    Assessment/Plan:  acute Asthma  exacerbation: Improving -  continue Xopenex and Atrovent breathing treatments, O2 supplementation via nasal cannula  -  continue and taper Solu-Medrol IV today - Continue Symbicort, IV Avelox, incentive spirometry  - Will place her back on her maintenance steroids once the acute event/asthma exacerbation has resolved   .Tachycardia sinus: Probably worsened due to albuterol nebs, patient also had transient chest tightness with the elevated BNP (received IV fluids in ED), no prior history of coronary artery disease /CHF. PE ruled out on CT angiogram of the chest.  -cardiac enzymes x3 negative, given one dose of Lasix, - Pending 2-D echocardiogram for further workup  -  TSH 0.61  .Chronic back pain: Continue tramadol and Percocet PRN   .Hyperlipidemia: Continue statins   . History of brain surgery with chronic corticosteroid use: Will continue IV Solu-Medrol, placed back on maintenance sure it is once acute asthma resolved   Black stool/ FOBT pos: - Will obtain serial H&H, placed on PPI, ? C. Diff - Will discuss with gastroenterology if hemoglobin dropping.   DVT prophylaxis: SCDs   CODE STATUS: Discussed in detail with the patient, she opted to be full CODE STATUS   LOS: 1 day   Keelynn Furgerson M.D. [facility] 01/22/2012, 12:24 PM Pager: [phone]

## 2012-01-22 NOTE — Progress Notes (Signed)
RT completed a patient assessment.  Patient takes Symbicort BID and Albuterol Nebulizer treatments BID per patient.  Chest xray per report is clear.  Patient is a previous smoker who quit one month ago with a history of asthma per patient.  BBS are diminished.  Patient is on RA sats are 100%.  Patient states that she is only getting short of breath with movement.  Treatment schedule is currently Xopenex and Atrovent Q6H.  RT is modifying order and changing treatments to Three times daily per Protocol.

## 2012-01-22 NOTE — Progress Notes (Signed)
Pt stool positive for blood per fecal occult sample sent to lab.  MD has been made aware.  Will continue to monitor. Nino Glow RN

## 2012-01-23 ENCOUNTER — Encounter (HOSPITAL_COMMUNITY): Payer: Self-pay

## 2012-01-23 ENCOUNTER — Other Ambulatory Visit: Payer: Self-pay

## 2012-01-23 DIAGNOSIS — K254 Chronic or unspecified gastric ulcer with hemorrhage: Secondary | ICD-10-CM

## 2012-01-23 DIAGNOSIS — B3781 Candidal esophagitis: Secondary | ICD-10-CM

## 2012-01-23 DIAGNOSIS — I369 Nonrheumatic tricuspid valve disorder, unspecified: Secondary | ICD-10-CM

## 2012-01-23 DIAGNOSIS — D62 Acute posthemorrhagic anemia: Secondary | ICD-10-CM

## 2012-01-23 DIAGNOSIS — R000 Tachycardia, unspecified: Secondary | ICD-10-CM

## 2012-01-23 DIAGNOSIS — K2901 Acute gastritis with bleeding: Secondary | ICD-10-CM

## 2012-01-23 DIAGNOSIS — J45901 Unspecified asthma with (acute) exacerbation: Principal | ICD-10-CM

## 2012-01-23 DIAGNOSIS — R1310 Dysphagia, unspecified: Secondary | ICD-10-CM

## 2012-01-23 DIAGNOSIS — K921 Melena: Secondary | ICD-10-CM

## 2012-01-23 DIAGNOSIS — M545 Low back pain: Secondary | ICD-10-CM

## 2012-01-23 LAB — URINE CULTURE: Colony Count: NO GROWTH

## 2012-01-23 LAB — BASIC METABOLIC PANEL
BUN: 38 mg/dL — ABNORMAL HIGH (ref 6–23)
CO2: 22 mEq/L (ref 19–32)
Calcium: 8.3 mg/dL — ABNORMAL LOW (ref 8.4–10.5)
Creatinine, Ser: 1.12 mg/dL — ABNORMAL HIGH (ref 0.50–1.10)
Glucose, Bld: 138 mg/dL — ABNORMAL HIGH (ref 70–99)
Sodium: 142 mEq/L (ref 135–145)

## 2012-01-23 LAB — HEMOGLOBIN AND HEMATOCRIT, BLOOD: HCT: 20 % — ABNORMAL LOW (ref 36.0–46.0)

## 2012-01-23 LAB — ABO/RH: ABO/RH(D): B POS

## 2012-01-23 LAB — PREPARE RBC (CROSSMATCH)

## 2012-01-23 LAB — CLOSTRIDIUM DIFFICILE BY PCR: Toxigenic C. Difficile by PCR: NEGATIVE

## 2012-01-23 MED ORDER — SODIUM CHLORIDE 0.9 % IV SOLN
INTRAVENOUS | Status: DC
  Administered 2012-01-23 – 2012-01-24 (×2): via INTRAVENOUS

## 2012-01-23 MED ORDER — METHYLPREDNISOLONE SODIUM SUCC 40 MG IJ SOLR
40.0000 mg | Freq: Two times a day (BID) | INTRAMUSCULAR | Status: DC
  Administered 2012-01-23 – 2012-01-24 (×2): 40 mg via INTRAVENOUS
  Filled 2012-01-23 (×6): qty 1

## 2012-01-23 MED ORDER — PANTOPRAZOLE SODIUM 40 MG IV SOLR
40.0000 mg | Freq: Two times a day (BID) | INTRAVENOUS | Status: DC
  Administered 2012-01-23 – 2012-01-25 (×5): 40 mg via INTRAVENOUS
  Filled 2012-01-23 (×6): qty 40

## 2012-01-23 NOTE — Progress Notes (Signed)
01/22/2012  Around 2000 On call MD was paged and notified of patient's hemoglobin of 8.1 which trended downward from her hemoglobin of 8.9 and positive hemoccult. Patient had orders placed this am on 01/23/2012 for blood transfusion of 2 units of PRBC's. Informed consent for blood was signed and placed in chart.Patient's vials signs were documented and blood transfusion is currently infusing. Patient remains A/O x 3  has currently had no signs or symptoms of reactions and has had no complaints.

## 2012-01-23 NOTE — Progress Notes (Signed)
Patient ID: Monica Copeland  female  ZOX:[id number]    DOB: [birth year]    DOA: 01/21/2012  PCP: Aura Dials, MD, MD  Subjective: Breathing and wheezing improved, hemoglobin trended down to 6.9 today, placed on 2 units packed RBCs. Episode of melena.   Objective: Weight change: -2.587 kg (-5 lb 11.3 oz)  Intake/Output Summary (Last 24 hours) at 01/23/12 1312 Last data filed at 01/23/12 1200  Gross per 24 hour  Intake 1222.5 ml  Output    450 ml  Net  772.5 ml   Blood pressure 91/51, pulse 80, temperature 97.7 F (36.5 C), temperature source Oral, resp. rate 18, height 5' (1.524 m), weight 63.413 kg (139 lb 12.8 oz), SpO2 97.00%.  Physical Exam: General: Alert and awake, oriented x3, not in any acute distress. HEENT: anicteric sclera, pupils reactive to light and accommodation, EOMI CVS: S1-S2 clear, no murmur rubs or gallops Chest: Wheezing significantly improved from yesterday  Abdomen: soft nontender, nondistended, normal bowel sounds, no organomegaly Extremities: no cyanosis, clubbing or edema noted bilaterally Neuro: Cranial nerves II-XII intact, no focal neurological deficits  Lab Results: Basic Metabolic Panel:  Lab 01/22/12 8119 01/21/12 1552  NA 135 134*  K 3.7 4.6  CL 103 101  CO2 18* 20  GLUCOSE 143* 117*  BUN 68* 65*  CREATININE 1.00 1.06  CALCIUM 8.0* 8.6  MG -- --  PHOS -- --   Liver Function Tests:  Lab 01/21/12 1552  AST 20  ALT 26  ALKPHOS 66  BILITOT 0.2*  PROT 6.3  ALBUMIN 3.2*   CBC:  Lab 01/23/12 0404 01/22/12 2001 01/22/12 0323 01/21/12 1552  WBC -- -- 22.5* 23.4*  NEUTROABS -- -- -- 19.2*  HGB 6.9* 7.4* -- --  HCT 20.0* 21.6* -- --  MCV -- -- 82.6 83.9  PLT -- -- 290 363   Cardiac Enzymes:  Lab 01/22/12 0831 01/22/12 0322 01/21/12 2107  CKTOTAL 45 51 62  CKMB 4.0 4.1* 3.4  CKMBINDEX -- -- --  TROPONINI <0.30 <0.30 <0.30   BNP: No components found with this basename: POCBNP:2 CBG: No results found for this basename:  GLUCAP:5 in the last 168 hours   Micro Results: Recent Results (from the past 240 hour(s))  CULTURE, BLOOD (ROUTINE X 2)     Status: Normal (Preliminary result)   Collection Time   01/21/12  3:50 PM      Component Value Range Status Comment   Specimen Description BLOOD RIGHT ARM   Final    Special Requests     Final    Value: BOTTLES DRAWN AEROBIC AND ANAEROBIC 10CC BLUE 5CC RED   Culture  Setup Time [id number]   Final    Culture     Final    Value:        BLOOD CULTURE RECEIVED NO GROWTH TO DATE CULTURE WILL BE HELD FOR 5 DAYS BEFORE ISSUING A FINAL NEGATIVE REPORT   Report Status PENDING   Incomplete   CULTURE, BLOOD (ROUTINE X 2)     Status: Normal (Preliminary result)   Collection Time   01/21/12  4:00 PM      Component Value Range Status Comment   Specimen Description BLOOD LEFT HAND   Final    Special Requests     Final    Value: BOTTLES DRAWN AEROBIC AND ANAEROBIC 10CC BLUE 7CC RED   Culture  Setup Time [id number]   Final    Culture     Final    Value:  BLOOD CULTURE RECEIVED NO GROWTH TO DATE CULTURE WILL BE HELD FOR 5 DAYS BEFORE ISSUING A FINAL NEGATIVE REPORT   Report Status PENDING   Incomplete   URINE CULTURE     Status: Normal (Preliminary result)   Collection Time   01/21/12  5:19 PM      Component Value Range Status Comment   Specimen Description URINE, CLEAN CATCH   Final    Special Requests NONE   Final    Culture  Setup Time [id number]   Final    Colony Count 10,000 COLONIES/ML   Final    Culture     Final    Value: STAPHYLOCOCCUS SPECIES (COAGULASE NEGATIVE)     Note: RIFAMPIN AND GENTAMICIN SHOULD NOT BE USED AS SINGLE DRUGS FOR TREATMENT OF STAPH INFECTIONS.   Report Status PENDING   Incomplete   URINE CULTURE     Status: Normal   Collection Time   01/21/12  9:06 PM      Component Value Range Status Comment   Specimen Description URINE, RANDOM   Final    Special Requests NONE   Final    Culture  Setup Time [id number]   Final    Colony  Count NO GROWTH   Final    Culture NO GROWTH   Final    Report Status 01/23/2012 FINAL   Final   CLOSTRIDIUM DIFFICILE BY PCR     Status: Normal   Collection Time   01/23/12 10:47 AM      Component Value Range Status Comment   C difficile by pcr NEGATIVE  NEGATIVE  Final     Studies/Results: Ct Angio Chest W/cm &/or Wo Cm  01/21/2012  *RADIOLOGY REPORT*  Clinical Data: Productive cough for weeks or months.  Chronic headaches.  Wheezing.  CT ANGIOGRAPHY CHEST  Technique:  Multidetector CT imaging of the chest using the standard protocol during bolus administration of intravenous contrast. Multiplanar reconstructed images including MIPs were obtained and reviewed to evaluate the vascular anatomy.  Contrast: 80mL OMNIPAQUE IOHEXOL 350 MG/ML SOLN  Comparison: Chest x-ray 01/21/2012  Findings:  Normal heart and great vessels. Mild transverse aortic arch calcification.  Slight left main coronary artery calcification.  No pulmonary emboli.  No pleural or pericardial effusion.  No pulmonary nodules.  No hilar or mediastinal adenopathy.  Midline trachea and normal bronchi. Slight mucous plug right lower lobe bronchus (image 169, series 8). No acute osseous findings.  Visualized upper abdominal structures unremarkable.  IMPRESSION: No evidence for pneumonia or pulmonary emboli.  Mild coronary artery calcification.  Slight mucus plugging right lower lobe bronchus.  Original Report Authenticated By: Elsie Stain, M.D.   Dg Chest Portable 1 View  01/21/2012  *RADIOLOGY REPORT*  Clinical Data: Shortness of breath and cough.  Tightness in the chest.  PORTABLE CHEST - 1 VIEW  Comparison: 08/19/2011  Findings: Patient is slightly rotated.  Heart size is probably upper limits normal.  There are no focal consolidations or pleural effusions.  No edema.  IMPRESSION: No evidence for acute cardiopulmonary abnormality.  Original Report Authenticated By: Patterson Hammersmith, M.D.    Medications: Scheduled Meds:    .  ALPRAZolam  0.5 mg Oral BID  . budesonide-formoterol  2 puff Inhalation BID  . ezetimibe-simvastatin  1 tablet Oral QHS  . ipratropium  0.5 mg Nebulization TID  . levalbuterol  0.63 mg Nebulization TID  . methylPREDNISolone (SOLU-MEDROL) injection  40 mg Intravenous Q12H  . moxifloxacin  400 mg Intravenous Q24H  .  pantoprazole (PROTONIX) IV  40 mg Intravenous Q12H  . senna-docusate  1 tablet Oral Daily  . sodium chloride  3 mL Intravenous Q12H  . zonisamide  300 mg Oral Daily  . DISCONTD: methylPREDNISolone (SOLU-MEDROL) injection  40 mg Intravenous Q8H  . DISCONTD: pantoprazole  40 mg Oral Q0600   Continuous Infusions:    . sodium chloride       Assessment/Plan:  Acute Asthma exacerbation: Improving -  continue Xopenex and Atrovent breathing treatments, O2 supplementation via nasal cannula  -  continue and tapered Solu-Medrol IV today. - Continue Symbicort, IV Avelox, incentive spirometry  - Transition to her maintenance steroids tomorrow as acute event/wheezing has improved    .Tachycardia sinus one episode of SVT: Probably worsened due to albuterol nebs, patient also had transient chest tightness with the elevated BNP (received IV fluids in ED), no prior history of coronary artery disease /CHF. PE ruled out on CT angiogram of the chest. TSH normal -cardiac enzymes x3 negative, given one dose of Lasix at the time of admission, 2-D echo pending - Ordered an EKG, start cardiac enzymes, BMET, mag  .Chronic back pain: Continue tramadol and Percocet PRN   .Hyperlipidemia: Continue statins   . History of brain surgery with chronic corticosteroid use: Will continue IV Solu-Medrol, tapered today - Transition her to maintenance steroids tomorrow   Normocytic and anemia with melena /GI bleed: - Continue IV PPI, and gastroenterology consult obtained, endoscopy tomorrow  DVT prophylaxis: SCDs   CODE STATUS: Discussed in detail with the patient, she opted to be full CODE STATUS    LOS: 2 days   Markeisha Mancias M.D. [facility] 01/23/2012, 1:12 PM Pager: [phone]

## 2012-01-23 NOTE — Progress Notes (Signed)
*  PRELIMINARY RESULTS* Echocardiogram 2D Echocardiogram has been performed.  Jeryl Columbia R 01/23/2012, 2:16 PM

## 2012-01-23 NOTE — Progress Notes (Signed)
Pt had a run of SVT HR 170s non sustained pt asymptomatic bp 96/64 Dr. Isidoro Donning made aware.

## 2012-01-23 NOTE — Consult Note (Signed)
Referring Provider: Dr. Isidoro Donning- [facility] Care Physician:  Aura Dials, MD, MD Primary Gastroenterologist:  none  Reason for Consultation:  Anemia, black stools  HPI: Monica Copeland is a 71 y.o. female admitted to the hospital on 01/21/2012 with complaints of weakness and shortness of breath. She has history of asthma, and recently quit smoking. She has history of chronic back pain seizure disorder and chronic steroid use post a remote brain surgery in 1987. She is status post a total tunnel hysterectomy also had a temporary colostomy about 20 years ago for what sounds like an acute lower GI bleed then had subsequent reversal. She is also status post appendectomy.  Patient reports that she did undergo a colonoscopy and upper endoscopy done in [place] about a year ago. She does not know the physician's name but says the practice was [facility]. She was told that she had a hiatal hernia and otherwise exam was normal. The patient relates that she does have history of chronic GERD and generally stays on a PPI though I do not see that listed in her medication list on admission but her She also takes a baby aspirin daily denies any regular NSAID use. Patient says that she has not felt well since the beginning of the year after developing the flu. She says since then she's had chronic problems with cough shortness of breath and some sputum production. She had been on a couple of courses of antibiotics without any real improvement in her symptoms. She says over the past couple of weeks her cough and shortness of breath have been worse and she also developed significant weakness. She had noticed that her stools been dark off and on but had also taken some Pepto-Bismol periodically. She says she couple of occasions had seen a small amount of bright red blood as well. She has some mild epigastric discomfort also has frequent heartburn and indigestion. She says she has chronic problems with  dysphasia and generally avoids bread and meat.  On admission her hemoglobin was 10.5 hematocrit of 31.3 yesterday hemoglobin 8.9 hematocrit 25.7 and today hemoglobin 8.1. She is receiving 2 units of packed RBCs currently BUN yesterday was elevated at 68 creatinine 1.0.   Past Medical History  Diagnosis Date  . Asthma   . Back pain   . Seizures     Past Surgical History  Procedure Date  . Brain surgery   . Colon surgery   . Abdominal hysterectomy     Prior to Admission medications   Medication Sig Start Date End Date Taking? Authorizing Provider  albuterol (PROVENTIL HFA;VENTOLIN HFA) 108 (90 BASE) MCG/ACT inhaler Inhale 2 puffs into the lungs every 6 (six) hours as needed. For shortness of breath   Yes Historical Provider, MD  albuterol (PROVENTIL) (2.5 MG/3ML) 0.083% nebulizer solution Take 2.5 mg by nebulization every 6 (six) hours as needed. For shortness of breath   Yes Historical Provider, MD  budesonide-formoterol (SYMBICORT) 160-4.5 MCG/ACT inhaler Inhale 2 puffs into the lungs 2 (two) times daily.   Yes Historical Provider, MD  cortisone (CORTONE) 25 MG tablet Take 37.5 mg by mouth daily. Take 1 tablet in the morning and 0.5 tablet in the afternoon   Yes Historical Provider, MD  ezetimibe-simvastatin (VYTORIN) 10-40 MG per tablet Take 1 tablet by mouth at bedtime.   Yes Historical Provider, MD  traMADol (ULTRAM-ER) 100 MG 24 hr tablet Take 100 mg by mouth daily as needed. For headache or back pain   Yes Historical  Provider, MD  zonisamide (ZONEGRAN) 100 MG capsule Take 300 mg by mouth daily. Take 1 capsule in the morning and 2 capsules in the evening   Yes Historical Provider, MD    Current Facility-Administered Medications  Medication Dose Route Frequency Provider Last Rate Last Dose  . acetaminophen (TYLENOL) tablet 650 mg  650 mg Oral Q6H PRN Ripudeep Jenna Luo, MD       Or  . acetaminophen (TYLENOL) suppository 650 mg  650 mg Rectal Q6H PRN Ripudeep Jenna Luo, MD      .  ALPRAZolam Prudy Feeler) tablet 0.5 mg  0.5 mg Oral BID Ripudeep K Rai, MD   0.5 mg at 01/22/12 2152  . alum & mag hydroxide-simeth (MAALOX/MYLANTA) 200-200-20 MG/5ML suspension 30 mL  30 mL Oral Q6H PRN Ripudeep K Rai, MD      . budesonide-formoterol (SYMBICORT) 160-4.5 MCG/ACT inhaler 2 puff  2 puff Inhalation BID Ripudeep Jenna Luo, MD   2 puff at 01/22/12 2026  . ezetimibe-simvastatin (VYTORIN) 10-40 MG per tablet 1 tablet  1 tablet Oral QHS Ripudeep Jenna Luo, MD   1 tablet at 01/22/12 2151  . guaiFENesin-dextromethorphan (ROBITUSSIN DM) 100-10 MG/5ML syrup 5 mL  5 mL Oral Q4H PRN Ripudeep Jenna Luo, MD      . HYDROcodone-acetaminophen (NORCO) 5-325 MG per tablet 1 tablet  1 tablet Oral Q6H PRN Ripudeep Jenna Luo, MD   1 tablet at 01/22/12 2353  . ipratropium (ATROVENT) nebulizer solution 0.5 mg  0.5 mg Nebulization TID Ripudeep K Rai, MD   0.5 mg at 01/22/12 2026  . levalbuterol (XOPENEX) nebulizer solution 0.63 mg  0.63 mg Nebulization Q2H PRN Ripudeep K Rai, MD      . levalbuterol Pauline Aus) nebulizer solution 0.63 mg  0.63 mg Nebulization TID Ripudeep Jenna Luo, MD   0.63 mg at 01/22/12 2026  . methylPREDNISolone sodium succinate (SOLU-MEDROL) 40 mg/mL injection 40 mg  40 mg Intravenous Q8H Ripudeep K Rai, MD   40 mg at 01/23/12 0548  . moxifloxacin (AVELOX) IVPB 400 mg  400 mg Intravenous Q24H Ripudeep K Rai, MD   400 mg at 01/22/12 2152  . ondansetron (ZOFRAN) tablet 4 mg  4 mg Oral Q6H PRN Ripudeep K Rai, MD       Or  . ondansetron (ZOFRAN) injection 4 mg  4 mg Intravenous Q6H PRN Ripudeep K Rai, MD      . pantoprazole (PROTONIX) EC tablet 40 mg  40 mg Oral Q0600 Ripudeep Jenna Luo, MD   40 mg at 01/23/12 0548  . senna-docusate (Senokot-S) tablet 1 tablet  1 tablet Oral Daily Ripudeep Jenna Luo, MD   1 tablet at 01/22/12 1452  . sodium chloride 0.9 % injection 3 mL  3 mL Intravenous Q12H Ripudeep Jenna Luo, MD   3 mL at 01/22/12 2151  . traMADol (ULTRAM) tablet 50 mg  50 mg Oral Q12H PRN Ripudeep K Rai, MD      . zonisamide  (ZONEGRAN) capsule 300 mg  300 mg Oral Daily Ripudeep K Rai, MD   300 mg at 01/22/12 1037  . DISCONTD: methylPREDNISolone sodium succinate (SOLU-MEDROL) 125 mg/2 mL injection 60 mg  60 mg Intravenous Q6H Ripudeep K Rai, MD   60 mg at 01/22/12 0511    Allergies as of 01/21/2012 - Review Complete 01/21/2012  Allergen Reaction Noted  . Sleep aid (diphenhydramine) Other (See Comments) 01/21/2012  . Tomato Rash 01/21/2012    History reviewed. No pertinent family history.  History   Social History  .  Marital Status: Widowed    Spouse Name: N/A    Number of Children: N/A  . Years of Education: N/A   Occupational History  . Not on file.   Social History Main Topics  . Smoking status: Current Everyday Smoker -- 1.0 packs/day    Types: Cigarettes  . Smokeless tobacco: Never Used  . Alcohol Use: No  . Drug Use: No  . Sexually Active:    Other Topics Concern  . Not on file   Social History Narrative  . No narrative on file    Review of Systems: Pertinent positive and negative review of systems were noted in the above HPI section.  All other review of systems was otherwise negative. Physical Exam: Vital signs in last 24 hours: Temp:  [97.6 F (36.4 C)-98.5 F (36.9 C)] 97.6 F (36.4 C) (05/28 0815) Pulse Rate:  [59-101] 73  (05/28 0815) Resp:  [16-20] 18  (05/28 0815) BP: (89-120)/(47-71) 103/62 mmHg (05/28 0815) SpO2:  [98 %-100 %] 98 % (05/28 0503) Weight:  [139 lb 12.8 oz (63.413 kg)] 139 lb 12.8 oz (63.413 kg) (05/28 0503) Last BM Date: 01/22/12 General:   Alert,  Well-developed, well-nourished, pale older white female, pleasant and cooperative in NAD, speech somewhat difficult to understand Head:  Normocephalic and atraumatic. Eyes:  Sclera clear, no icterus.   Conjunctiva pale Ears:  Normal auditory acuity. Nose:  No deformity, discharge,  or lesions. Mouth:  No deformity or lesions.   Neck:  Supple; no masses or thyromegaly. Lungs:  Clear throughout to  auscultation.   No wheezes, crackles, or rhonchi. Heart:  Regular rate and rhythm; no murmurs, clicks, rubs,  or gallops. Abdomen:  Soft, mildly tender in the left lower quadrant there is no guarding no rebound no palpable mass or hepatosplenomegaly bowel sounds are active, does have midline incisional scar  Rectal: Black strongly Hemoccult-positive stool Msk:  Symmetrical without gross deformities. . Pulses:  Normal pulses noted. Extremities:  Without clubbing or edema. Neurologic:  Alert and  oriented x4;  grossly normal neurologically. Skin:  Intact without significant lesions or rashes.. Psych:  Alert and cooperative. Normal mood and affect.  Intake/Output from previous day: 05/27 0701 - 05/28 0700 In: 1333 [P.O.:1080; I.V.:3; IV Piggyback:250] Out: 250 [Urine:250] Intake/Output this shift: Total I/O In: 252.5 [P.O.:240; Blood:12.5] Out: -   Lab Results:  Basename 01/23/12 0404 01/22/12 2001 01/22/12 1212 01/22/12 0323 01/21/12 1552  WBC -- -- -- 22.5* 23.4*  HGB 6.9* 7.4* 8.1* -- --  HCT 20.0* 21.6* 23.3* -- --  PLT -- -- -- 290 363   BMET  Basename 01/22/12 0323 01/21/12 1552  NA 135 134*  K 3.7 4.6  CL 103 101  CO2 18* 20  GLUCOSE 143* 117*  BUN 68* 65*  CREATININE 1.00 1.06  CALCIUM 8.0* 8.6   LFT  Basename 01/21/12 1552  PROT 6.3  ALBUMIN 3.2*  AST 20  ALT 26  ALKPHOS 66  BILITOT 0.2*  BILIDIR --  IBILI --   PT/INR No results found for this basename: LABPROT:2,INR:2 in the last 72 hours      Studies/Results: Ct Angio Chest W/cm &/or Wo Cm  01/21/2012  *RADIOLOGY REPORT*  Clinical Data: Productive cough for weeks or months.  Chronic headaches.  Wheezing.  CT ANGIOGRAPHY CHEST  Technique:  Multidetector CT imaging of the chest using the standard protocol during bolus administration of intravenous contrast. Multiplanar reconstructed images including MIPs were obtained and reviewed to evaluate the vascular anatomy.  Contrast: 80mL OMNIPAQUE IOHEXOL 350  MG/ML SOLN  Comparison: Chest x-ray 01/21/2012  Findings:  Normal heart and great vessels. Mild transverse aortic arch calcification.  Slight left main coronary artery calcification.  No pulmonary emboli.  No pleural or pericardial effusion.  No pulmonary nodules.  No hilar or mediastinal adenopathy.  Midline trachea and normal bronchi. Slight mucous plug right lower lobe bronchus (image 169, series 8). No acute osseous findings.  Visualized upper abdominal structures unremarkable.  IMPRESSION: No evidence for pneumonia or pulmonary emboli.  Mild coronary artery calcification.  Slight mucus plugging right lower lobe bronchus.  Original Report Authenticated By: Elsie Stain, M.D.   Dg Chest Portable 1 View  01/21/2012  *RADIOLOGY REPORT*  Clinical Data: Shortness of breath and cough.  Tightness in the chest.  PORTABLE CHEST - 1 VIEW  Comparison: 08/19/2011  Findings: Patient is slightly rotated.  Heart size is probably upper limits normal.  There are no focal consolidations or pleural effusions.  No edema.  IMPRESSION: No evidence for acute cardiopulmonary abnormality.  Original Report Authenticated By: Patterson Hammersmith, M.D.    IMPRESSION:  #86 71 year old female with melena , and normocytic anemia. Suspect acute/subacute upper GI bleed-rule out steroid-induced peptic ulcer disease. #2 shortness of breath, multifactorial with asthma and anemia #3 status post remote colostomy and reversal for an acute lower GI bleed question diverticular. #4 chronic steroid use post remote craniotomy #5 chronic pain syndrome #6 chronic solid food dysphagia, rule out underlying esophageal stricture, versus motility disorder.  PLAN: #1  Start IV PPI twice daily #2 transfuse to keep hemoglobin 9 #3 patient to sign release so her endoscopic records can be obtained from procedures done last year #4 schedule for upper endoscopy with Dr. Marina Goodell in a.m. Wednesday, 01/24/2012. Procedure was discussed in detail with the  patient and she is agreeable to proceed .   Amy Esterwood  01/23/2012, 9:06 AM  GI ATTENDING  HISTORY, LABS, X-RAYS REVIEWED. PATIENT SEEN AND EXAMINED. AGREE WITH H&P AS OUTLINED ABOVE. PATIENT WITH MULTIPLE CHRONIC MEDICAL PROBLEMS. PRESENTS WITH SOB IN FACE OF GI BLEED (LIKELY UPPER). STABLE POST TRANSFUSION. AGREE WITH PPI, GET OLD RECORDS, AND EGD TOMORROW. SHE IS HIGH RISK WITH HER PULMONARY DISEASE.The nature of the procedure, as well as the risks, benefits, and alternatives were carefully and thoroughly reviewed with the patient. Ample time for discussion and questions allowed. The patient understood, was satisfied, and agreed to proceed.   Wilhemina Bonito. Eda Keys., M.D. [facility]

## 2012-01-23 NOTE — Progress Notes (Signed)
CRITICAL VALUE ALERT  Critical value received:Hgb 6.9  Date of notification:  01/23/2012  Time of notification:  0535  Critical value read back:yes  Nurse who received alert:  Leonia Reeves RN  MD notified (1st page):  Craige Cotta, NP  Time of first page:  [phone]  MD notified (2nd page):  Time of second page:  Responding MD:  Craige Cotta, NP  Time MD responded: [phone]

## 2012-01-24 ENCOUNTER — Encounter (HOSPITAL_COMMUNITY)
Admission: EM | Disposition: A | Discharge status: Home / Self Care | Payer: Self-pay | Source: Home / Self Care | Attending: Internal Medicine

## 2012-01-24 ENCOUNTER — Encounter (HOSPITAL_COMMUNITY): Payer: Self-pay

## 2012-01-24 DIAGNOSIS — K2901 Acute gastritis with bleeding: Secondary | ICD-10-CM

## 2012-01-24 DIAGNOSIS — J45901 Unspecified asthma with (acute) exacerbation: Secondary | ICD-10-CM

## 2012-01-24 DIAGNOSIS — B3781 Candidal esophagitis: Secondary | ICD-10-CM

## 2012-01-24 DIAGNOSIS — K259 Gastric ulcer, unspecified as acute or chronic, without hemorrhage or perforation: Secondary | ICD-10-CM | POA: Diagnosis present

## 2012-01-24 DIAGNOSIS — M545 Low back pain: Secondary | ICD-10-CM

## 2012-01-24 DIAGNOSIS — K219 Gastro-esophageal reflux disease without esophagitis: Secondary | ICD-10-CM

## 2012-01-24 DIAGNOSIS — K254 Chronic or unspecified gastric ulcer with hemorrhage: Secondary | ICD-10-CM

## 2012-01-24 HISTORY — DX: Gastro-esophageal reflux disease without esophagitis: K21.9

## 2012-01-24 LAB — URINE CULTURE: Colony Count: 10000

## 2012-01-24 LAB — TYPE AND SCREEN: Unit division: 0

## 2012-01-24 LAB — CBC
HCT: 27.9 % — ABNORMAL LOW (ref 36.0–46.0)
MCH: 29.4 pg (ref 26.0–34.0)
MCV: 84.5 fL (ref 78.0–100.0)
RDW: 14.2 % (ref 11.5–15.5)
WBC: 31.5 10*3/uL — ABNORMAL HIGH (ref 4.0–10.5)

## 2012-01-24 SURGERY — ESOPHAGOGASTRODUODENOSCOPY (EGD) WITH ESOPHAGEAL DILATION
Anesthesia: Moderate Sedation

## 2012-01-24 MED ORDER — CORTISONE ACETATE 25 MG PO TABS
12.5000 mg | ORAL_TABLET | Freq: Every day | ORAL | Status: DC
  Administered 2012-01-24: 12.5 mg via ORAL
  Filled 2012-01-24 (×2): qty 1

## 2012-01-24 MED ORDER — FENTANYL CITRATE 0.05 MG/ML IJ SOLN
INTRAMUSCULAR | Status: AC
  Filled 2012-01-24: qty 2

## 2012-01-24 MED ORDER — BUTAMBEN-TETRACAINE-BENZOCAINE 2-2-14 % EX AERO
INHALATION_SPRAY | CUTANEOUS | Status: DC | PRN
  Administered 2012-01-24: 2 via TOPICAL

## 2012-01-24 MED ORDER — FENTANYL CITRATE 0.05 MG/ML IJ SOLN
INTRAMUSCULAR | Status: DC | PRN
  Administered 2012-01-24 (×2): 25 ug via INTRAVENOUS

## 2012-01-24 MED ORDER — ALBUTEROL SULFATE (5 MG/ML) 0.5% IN NEBU
2.5000 mg | INHALATION_SOLUTION | Freq: Two times a day (BID) | RESPIRATORY_TRACT | Status: DC
  Administered 2012-01-24 – 2012-01-25 (×3): 2.5 mg via RESPIRATORY_TRACT
  Filled 2012-01-24 (×3): qty 0.5

## 2012-01-24 MED ORDER — IPRATROPIUM BROMIDE 0.02 % IN SOLN
0.5000 mg | Freq: Two times a day (BID) | RESPIRATORY_TRACT | Status: DC
  Administered 2012-01-24 – 2012-01-25 (×3): 0.5 mg via RESPIRATORY_TRACT
  Filled 2012-01-24 (×3): qty 2.5

## 2012-01-24 MED ORDER — CORTISONE ACETATE 25 MG PO TABS
25.0000 mg | ORAL_TABLET | Freq: Every day | ORAL | Status: DC
  Administered 2012-01-25: 25 mg via ORAL
  Filled 2012-01-24 (×3): qty 1

## 2012-01-24 MED ORDER — MIDAZOLAM HCL 10 MG/2ML IJ SOLN
INTRAMUSCULAR | Status: AC
  Filled 2012-01-24: qty 2

## 2012-01-24 MED ORDER — FLUCONAZOLE 100 MG PO TABS
100.0000 mg | ORAL_TABLET | ORAL | Status: DC
  Administered 2012-01-24: 100 mg via ORAL
  Filled 2012-01-24 (×2): qty 1

## 2012-01-24 MED ORDER — CORTISONE ACETATE 25 MG PO TABS: 37.5000 mg | ORAL_TABLET | Freq: Every day | ORAL | Status: DC

## 2012-01-24 MED ORDER — MIDAZOLAM HCL 10 MG/2ML IJ SOLN
INTRAMUSCULAR | Status: DC | PRN
  Administered 2012-01-24 (×2): 1 mg via INTRAVENOUS
  Administered 2012-01-24: 2 mg via INTRAVENOUS
  Administered 2012-01-24: 1 mg via INTRAVENOUS

## 2012-01-24 NOTE — Progress Notes (Signed)
Patient ID: Katara A Mosher, female   DOB: 08/09/1941, 71 y.o.   MRN: [id number] [facility] Progress Note  Subjective: Feels pretty good, breathing well. She had an episode of SVT last pm-asymptomatic- hr in 60's now. 2 black bm's yesterday-none today. Hgb up to 9.7   Had contacted Dr.Bouska's office yesterday for records-thet do not have any EGD/colon reports  Objective:  Vital signs in last 24 hours: Temp:  [97.4 F (36.3 C)-98.2 F (36.8 C)] 97.4 F (36.3 C) (05/29 0418) Pulse Rate:  [58-86] 58  (05/29 0418) Resp:  [18] 18  (05/29 0418) BP: (91-110)/(49-65) 108/49 mmHg (05/29 0418) SpO2:  [96 %-100 %] 100 % (05/29 0825) Weight:  [143 lb 4.8 oz (65 kg)] 143 lb 4.8 oz (65 kg) (05/29 0418) Last BM Date: 01/22/12 General:   Alert,  Well-developed,    in NAD Heart:  Regular rate and rhythm; no murmurs Pulm; Abdomen:  Soft, nontender and nondistended. Normal bowel sounds, without guarding, and without rebound.   Extremities:  Without edema. Neurologic:  Alert and  oriented x4;  grossly normal neurologically. Psych:  Alert and cooperative. Normal mood and affect.  Intake/Output from previous day: 05/28 0701 - 05/29 0700 In: 2472.1 [P.O.:720; I.V.:1193.8; Blood:548.3; IV Piggyback:10] Out: 1200 [Urine:1200] Intake/Output this shift:    Lab Results:  Basename 01/24/12 0605 01/23/12 0404 01/22/12 2001 01/22/12 0323 01/21/12 1552  WBC 31.5* -- -- 22.5* 23.4*  HGB 9.7* 6.9* 7.4* -- --  HCT 27.9* 20.0* 21.6* -- --  PLT 203 -- -- 290 363   BMET  Basename 01/23/12 1523 01/22/12 0323 01/21/12 1552  NA 142 135 134*  K 3.5 3.7 4.6  CL 108 103 101  CO2 22 18* 20  GLUCOSE 138* 143* 117*  BUN 38* 68* 65*  CREATININE 1.12* 1.00 1.06  CALCIUM 8.3* 8.0* 8.6   LFT  Basename 01/21/12 1552  PROT 6.3  ALBUMIN 3.2*  AST 20  ALT 26  ALKPHOS 66  BILITOT 0.2*  BILIDIR --  IBILI --   PT/INR No results found for this basename: LABPROT:2,INR:2 in the last 72  hours    Assessment / Plan: #1  71 yo female  With acute/subacute Gi bleed with melena- suspect steroid induced PUD. For EGD today, continue bid PPi #2 anemia- improved post transfusions-secondary to Gi blood loss #3 transient SVT- stable now, 2d echo done and pending #4 asthma exacerbation /bronchitis #5 leukocytosis- ? All steroid related Principal Problem:  *Asthma exacerbation Active Problems:  Tachycardia  Chronic back pain  H/O brain surgery  Hyperlipidemia  Dyspnea  Blood in stool  Acute posthemorrhagic anemia  Dysphagia, unspecified     LOS: 3 days   Amy Esterwood  01/24/2012, 9:56 AM    GI Attending  FOR EGD TO EVALUATE MELENA  Anwar Crill N. Tyronza Happe, Jr., M.D. [facility] of Gastroenterology  

## 2012-01-24 NOTE — H&P (View-Only) (Signed)
Patient ID: Monica Copeland, female   DOB: [birth year], 71 y.o.   MRN: [id number] [facility] Progress Note  Subjective: Feels pretty good, breathing well. She had an episode of SVT last pm-asymptomatic- hr in 60's now. 2 black bm's yesterday-none today. Hgb up to 9.7   Had contacted Dr.Bouska's office yesterday for records-thet do not have any EGD/colon reports  Objective:  Vital signs in last 24 hours: Temp:  [97.4 F (36.3 C)-98.2 F (36.8 C)] 97.4 F (36.3 C) (05/29 0418) Pulse Rate:  [58-86] 58  (05/29 0418) Resp:  [18] 18  (05/29 0418) BP: (91-110)/(49-65) 108/49 mmHg (05/29 0418) SpO2:  [96 %-100 %] 100 % (05/29 0825) Weight:  [143 lb 4.8 oz (65 kg)] 143 lb 4.8 oz (65 kg) (05/29 0418) Last BM Date: 01/22/12 General:   Alert,  Well-developed,    in NAD Heart:  Regular rate and rhythm; no murmurs Pulm; Abdomen:  Soft, nontender and nondistended. Normal bowel sounds, without guarding, and without rebound.   Extremities:  Without edema. Neurologic:  Alert and  oriented x4;  grossly normal neurologically. Psych:  Alert and cooperative. Normal mood and affect.  Intake/Output from previous day: 05/28 0701 - 05/29 0700 In: 2472.1 [P.O.:720; I.V.:1193.8; Blood:548.3; IV Piggyback:10] Out: 1200 [Urine:1200] Intake/Output this shift:    Lab Results:  Basename 01/24/12 0605 01/23/12 0404 01/22/12 2001 01/22/12 0323 01/21/12 1552  WBC 31.5* -- -- 22.5* 23.4*  HGB 9.7* 6.9* 7.4* -- --  HCT 27.9* 20.0* 21.6* -- --  PLT 203 -- -- 290 363   BMET  Basename 01/23/12 1523 01/22/12 0323 01/21/12 1552  NA 142 135 134*  K 3.5 3.7 4.6  CL 108 103 101  CO2 22 18* 20  GLUCOSE 138* 143* 117*  BUN 38* 68* 65*  CREATININE 1.12* 1.00 1.06  CALCIUM 8.3* 8.0* 8.6   LFT  Basename 01/21/12 1552  PROT 6.3  ALBUMIN 3.2*  AST 20  ALT 26  ALKPHOS 66  BILITOT 0.2*  BILIDIR --  IBILI --   PT/INR No results found for this basename: LABPROT:2,INR:2 in the last 72  hours    Assessment / Plan: #1  71 yo female  With acute/subacute Gi bleed with melena- suspect steroid induced PUD. For EGD today, continue bid PPi #2 anemia- improved post transfusions-secondary to Gi blood loss #3 transient SVT- stable now, 2d echo done and pending #4 asthma exacerbation /bronchitis #5 leukocytosis- ? All steroid related Principal Problem:  *Asthma exacerbation Active Problems:  Tachycardia  Chronic back pain  H/O brain surgery  Hyperlipidemia  Dyspnea  Blood in stool  Acute posthemorrhagic anemia  Dysphagia, unspecified     LOS: 3 days   Amy Esterwood  01/24/2012, 9:56 AM    GI Attending  FOR EGD TO EVALUATE MELENA  Hilmar Moldovan N. Eda Keys., M.D. [facility] of Gastroenterology

## 2012-01-24 NOTE — Progress Notes (Signed)
Monica Copeland is a pleasant 71 y.o. female admitted with= SOB, later found to have melena. I have reviewed her chart, seen and examined her at bed side. Appreciate GI. Her EGD showed a pyloric ulcer and esophageal candidiasis. H.pylori test sent. Her hemoglobin has improved with prbc transfusion.  SUBJECTIVE Feels better. No shortness of breath.   1. Asthma exacerbation   2. Tachycardia   3. Acute posthemorrhagic anemia   4. Blood in stool   5. Dysphagia, unspecified   6. Candida esophagitis   7. Gastric ulcer with hemorrhage     Past Medical History  Diagnosis Date  . Asthma   . Back pain   . Seizures    Current Facility-Administered Medications  Medication Dose Route Frequency Provider Last Rate Last Dose  . acetaminophen (TYLENOL) tablet 650 mg  650 mg Oral Q6H PRN Ripudeep Jenna Luo, MD       Or  . acetaminophen (TYLENOL) suppository 650 mg  650 mg Rectal Q6H PRN Ripudeep K Rai, MD      . albuterol (PROVENTIL) (5 MG/ML) 0.5% nebulizer solution 2.5 mg  2.5 mg Nebulization BID Delaplaine Lions, RRT   2.5 mg at 01/24/12 1610  . ALPRAZolam Prudy Feeler) tablet 0.5 mg  0.5 mg Oral BID Ripudeep K Rai, MD   0.5 mg at 01/24/12 1039  . alum & mag hydroxide-simeth (MAALOX/MYLANTA) 200-200-20 MG/5ML suspension 30 mL  30 mL Oral Q6H PRN Ripudeep K Rai, MD      . budesonide-formoterol (SYMBICORT) 160-4.5 MCG/ACT inhaler 2 puff  2 puff Inhalation BID Ripudeep Jenna Luo, MD   2 puff at 01/24/12 0824  . ezetimibe-simvastatin (VYTORIN) 10-40 MG per tablet 1 tablet  1 tablet Oral QHS Ripudeep Jenna Luo, MD   1 tablet at 01/23/12 2126  . fluconazole (DIFLUCAN) tablet 100 mg  100 mg Oral Q24H Amy S Esterwood, PA      . guaiFENesin-dextromethorphan (ROBITUSSIN DM) 100-10 MG/5ML syrup 5 mL  5 mL Oral Q4H PRN Ripudeep K Rai, MD      . HYDROcodone-acetaminophen (NORCO) 5-325 MG per tablet 1 tablet  1 tablet Oral Q6H PRN Ripudeep Jenna Luo, MD   1 tablet at 01/23/12 2308  . ipratropium (ATROVENT) nebulizer solution  0.5 mg  0.5 mg Nebulization BID  Lions, RRT   0.5 mg at 01/24/12 9604  . levalbuterol (XOPENEX) nebulizer solution 0.63 mg  0.63 mg Nebulization Q2H PRN Ripudeep K Rai, MD      . methylPREDNISolone sodium succinate (SOLU-MEDROL) 40 mg/mL injection 40 mg  40 mg Intravenous Q12H Ripudeep Jenna Luo, MD   40 mg at 01/24/12 0620  . moxifloxacin (AVELOX) IVPB 400 mg  400 mg Intravenous Q24H Ripudeep K Rai, MD   400 mg at 01/23/12 2133  . ondansetron (ZOFRAN) tablet 4 mg  4 mg Oral Q6H PRN Ripudeep K Rai, MD       Or  . ondansetron (ZOFRAN) injection 4 mg  4 mg Intravenous Q6H PRN Ripudeep K Rai, MD      . pantoprazole (PROTONIX) injection 40 mg  40 mg Intravenous Q12H Amy S Esterwood, PA   40 mg at 01/24/12 1041  . senna-docusate (Senokot-S) tablet 1 tablet  1 tablet Oral Daily Ripudeep Jenna Luo, MD   1 tablet at 01/22/12 1452  . sodium chloride 0.9 % injection 3 mL  3 mL Intravenous Q12H Ripudeep K Rai, MD   3 mL at 01/24/12 1041  . traMADol (ULTRAM) tablet 50 mg  50 mg  Oral Q12H PRN Ripudeep Jenna Luo, MD      . zonisamide (ZONEGRAN) capsule 300 mg  300 mg Oral Daily Ripudeep K Rai, MD   300 mg at 01/24/12 1040  . DISCONTD: 0.9 %  sodium chloride infusion   Intravenous Continuous Ripudeep Jenna Luo, MD 75 mL/hr at 01/24/12 0402    . DISCONTD: butamben-tetracaine-benzocaine (CETACAINE) spray    PRN Hilarie Fredrickson, MD   2 spray at 01/24/12 1448  . DISCONTD: fentaNYL (SUBLIMAZE) injection    PRN Hilarie Fredrickson, MD   25 mcg at 01/24/12 1451  . DISCONTD: ipratropium (ATROVENT) nebulizer solution 0.5 mg  0.5 mg Nebulization TID Ripudeep K Rai, MD   0.5 mg at 01/23/12 1940  . DISCONTD: levalbuterol (XOPENEX) nebulizer solution 0.63 mg  0.63 mg Nebulization TID Ripudeep Jenna Luo, MD   0.63 mg at 01/23/12 1940  . DISCONTD: midazolam (VERSED) injection    PRN Hilarie Fredrickson, MD   1 mg at 01/24/12 1453   Allergies  Allergen Reactions  . Sleep Aid (Diphenhydramine) Other (See Comments)    Pt experiences reverse reaction:  lots of energy instead of sedative effect  . Tomato Rash   Principal Problem:  *Asthma exacerbation Active Problems:  Tachycardia  Chronic back pain  H/O brain surgery  Hyperlipidemia  Dyspnea  Blood in stool  Acute posthemorrhagic anemia  Dysphagia, unspecified  Gastric ulcer with hemorrhage  Candida esophagitis   Vital signs in last 24 hours: Temp:  [97.4 F (36.3 C)-98.3 F (36.8 C)] 98.3 F (36.8 C) (05/29 1624) Pulse Rate:  [54-70] 54  (05/29 1624) Resp:  [13-51] 16  (05/29 1624) BP: (95-145)/(40-90) 112/52 mmHg (05/29 1624) SpO2:  [93 %-100 %] 100 % (05/29 1624) Weight:  [65 kg (143 lb 4.8 oz)] 65 kg (143 lb 4.8 oz) (05/29 0418) Weight change: 1.588 kg (3 lb 8 oz) Last BM Date: 01/22/12  Intake/Output from previous day: 05/28 0701 - 05/29 0700 In: 2472.1 [P.O.:720; I.V.:1193.8; Blood:548.3; IV Piggyback:10] Out: 1200 [Urine:1200] Intake/Output this shift: Total I/O In: 0  Out: 225 [Urine:225]  Lab Results:  [facility] 01/24/12 0605 01/23/12 0404 01/22/12 0323  WBC 31.5* -- 22.5*  HGB 9.7* 6.9* --  HCT 27.9* 20.0* --  PLT 203 -- 290   BMET  Basename 01/23/12 1523 01/22/12 0323  NA 142 135  K 3.5 3.7  CL 108 103  CO2 22 18*  GLUCOSE 138* 143*  BUN 38* 68*  CREATININE 1.12* 1.00  CALCIUM 8.3* 8.0*    Studies/Results: No results found.  Medications: I have reviewed the patient's current medications.   Physical exam GENERAL- alert HEAD- normal atraumatic, no neck masses, normal thyroid, no jvd RESPIRATORY- appears well, vitals normal, no respiratory distress, acyanotic, normal RR, ear and throat exam is normal, neck free of mass or lymphadenopathy, chest clear, no wheezing, crepitations, rhonchi, normal symmetric air entry CVS- regular rate and rhythm, S1, S2 normal, no murmur, click, rub or gallop ABDOMEN- abdomen is soft without significant tenderness, masses, organomegaly or guarding NEURO- Grossly normal EXTREMITIES- extremities normal,  atraumatic, no cyanosis or edema  Plan   * Asthma exacerbation- improved. Switch steroids to cortisone oral(she is on maintenance steroids for brain tumour hx).  *  Acute posthemorrhagic anemia due to pyloric ulcer- responded to prbc transfusion. Avoid nsaids. Continue ppi. Follow biopsy/clo test.  * Esophageal candidiasis causing Dysphagia- agree with fluconazole.  * Hopefully d/c in next day or 2.   Lakela Kuba 01/24/2012 5:18 PM Pager: [phone].

## 2012-01-24 NOTE — Op Note (Signed)
[facility] [address] [place], [place]  [address]  ENDOSCOPY PROCEDURE REPORT  PATIENT:  Monica Copeland, Monica Copeland  MR#:  [id number] BIRTHDATE:  Aug 09, 1941, 70 yrs. old  GENDER:  female  ENDOSCOPIST:  Wilhemina Bonito. Eda Keys, MD Referred by:  [facility],  PROCEDURE DATE:  01/24/2012 PROCEDURE:  EGD with biopsy, [address] ASA CLASS:  Class II INDICATIONS:  melena, anemia, hemoccult positive stool , Dysphagia   MEDICATIONS:   Fentanyl 50 mcg IV, Versed 5 mg IV TOPICAL ANESTHETIC:  Cetacaine Spray  DESCRIPTION OF PROCEDURE:   After the risks benefits and alternatives of the procedure were thoroughly explained, informed consent was obtained.  The Pentax Gastroscope [id number] endoscope was introduced through the mouth and advanced to the second portion of the duodenum, without limitations.  The instrument was slowly withdrawn as the mucosa was fully examined. <<PROCEDUREIMAGES>>  Candida esophagitis (plaques) in the distal esophagus.  A based ulcer was found at the pylorus. No active bleeding. Clo bx taken. Otherwise the examination was normal to D2.    Retroflexed views revealed no abnormalities.    The scope was then withdrawn from the patient and the procedure completed.  COMPLICATIONS:  None  ENDOSCOPIC IMPRESSION: 1) Candida esophagitis in the distal esophagus 2) Ulcer at the pylorus 3) Otherwise normal examination 4) GERD  RECOMMENDATIONS: 1) Continue PPI bid x  weeks then q day indefinitely 2) Rx CLO if positive 3) Avoid  unneccesary NSAIDS 4) Diflucan 200 mg day one then 100 mg x 4 days  ______________________________ Wilhemina Bonito. Eda Keys, MD  CC:  Tracey Harries, MD; The Patient  n. eSIGNED:   Wilhemina Bonito. Eda Keys at 01/24/2012 03:16 PM  Dwain Sarna, [phone]

## 2012-01-24 NOTE — Interval H&P Note (Signed)
History and Physical Interval Note:  01/24/2012 2:41 PM  Monica Copeland  has presented today for surgery, with the diagnosis of melena  The various methods of treatment have been discussed with the patient and family. After consideration of risks, benefits and other options for treatment, the patient has consented to  Procedure(s) (LRB): ESOPHAGOGASTRODUODENOSCOPY (EGD) WITH ESOPHAGEAL DILATION (N/A) as a surgical intervention .  The patients' history has been reviewed, patient examined, no change in status, stable for surgery.  I have reviewed the patients' chart and labs.  Questions were answered to the patient's satisfaction.     Yancey Flemings

## 2012-01-25 DIAGNOSIS — K2901 Acute gastritis with bleeding: Secondary | ICD-10-CM

## 2012-01-25 DIAGNOSIS — M545 Low back pain: Secondary | ICD-10-CM

## 2012-01-25 DIAGNOSIS — J45901 Unspecified asthma with (acute) exacerbation: Secondary | ICD-10-CM

## 2012-01-25 DIAGNOSIS — B3781 Candidal esophagitis: Secondary | ICD-10-CM

## 2012-01-25 LAB — CBC
MCH: 29.6 pg (ref 26.0–34.0)
MCHC: 34.7 g/dL (ref 30.0–36.0)
MCV: 85.4 fL (ref 78.0–100.0)
Platelets: 208 10*3/uL (ref 150–400)
RBC: 3.21 MIL/uL — ABNORMAL LOW (ref 3.87–5.11)

## 2012-01-25 MED ORDER — LEVALBUTEROL HCL 0.63 MG/3ML IN NEBU: 0.6300 mg | INHALATION_SOLUTION | Freq: Four times a day (QID) | RESPIRATORY_TRACT | Status: DC | PRN

## 2012-01-25 MED ORDER — FLUCONAZOLE 100 MG PO TABS: 100.0000 mg | ORAL_TABLET | ORAL | Status: AC

## 2012-01-25 MED ORDER — PANTOPRAZOLE SODIUM 40 MG PO TBEC: 40.0000 mg | DELAYED_RELEASE_TABLET | Freq: Two times a day (BID) | ORAL | Status: DC

## 2012-01-25 MED ORDER — MOXIFLOXACIN HCL 400 MG PO TABS: 400.0000 mg | ORAL_TABLET | Freq: Every day | ORAL | Status: AC

## 2012-01-25 NOTE — Progress Notes (Signed)
UR Completed.  Brycen Bean Jane [phone] 01/25/2012  

## 2012-01-25 NOTE — Progress Notes (Signed)
Patient ID: Monica Copeland, female   DOB: 10-08-1940, 71 y.o.   MRN: [id number] [facility] Progress Note  Subjective: She is feeling much better-breathing well, energy level better- eating without difficulty. Had a Bm last night dark but not black  Objective:  Vital signs in last 24 hours: Temp:  [97.6 F (36.4 C)-98.3 F (36.8 C)] 97.8 F (36.6 C) (05/30 0559) Pulse Rate:  [54-90] 66  (05/30 0820) Resp:  [13-51] 18  (05/30 0820) BP: (95-145)/(40-90) 108/54 mmHg (05/30 0559) SpO2:  [93 %-100 %] 99 % (05/30 0820) Weight:  [145 lb 11.2 oz (66.089 kg)] 145 lb 11.2 oz (66.089 kg) (05/30 0559) Last BM Date: 01/22/12 General:   Alert,  Well-developed,    in NAD Heart:  Regular rate and rhythm; no murmurs Pulm;clear  Abdomen:  Soft, nontender and nondistended. Normal bowel sounds, without guarding.   Extremities:  Without edema. Neurologic:  Alert and  oriented x4;  grossly normal neurologically. Psych:  Alert and cooperative. Normal mood and affect.  Intake/Output from previous day: 05/29 0701 - 05/30 0700 In: 1100 [P.O.:600; IV Piggyback:500] Out: 425 [Urine:425] Intake/Output this shift: Total I/O In: 120 [P.O.:120] Out: -   Lab Results:  Basename 01/25/12 0605 01/24/12 0605 01/23/12 0404  WBC 20.4* 31.5* --  HGB 9.5* 9.7* 6.9*  HCT 27.4* 27.9* 20.0*  PLT 208 203 --   BMET  Basename 01/23/12 1523  NA 142  K 3.5  CL 108  CO2 22  GLUCOSE 138*  BUN 38*  CREATININE 1.12*  CALCIUM 8.3*    Assessment / Plan: #1 70 yo female  With acute gi bleed secondary to pyloric ulcer ,clean based. Likely med induced. clotest pending.  She is stable, no active bleeding #2 anemia secondary to above ,stable post transfusions #3 esophageal candidiasis- continue Diflucan x 5 days #4 Asthma/ bronchitis  She is OK for discharge from GI standpoint. Complete course of Diflucan Discharge on twice daily protonix 40 mg  X 6 weeks, then once daily indefinitely Will follow  up on clotest/hpylori and treat if indicated Follow up with GI as needed.  Amy Esterwood PA-C  GI ATTENDING  SEEN AND EXAMINED. AGREE WITH ABOVE. RESULTS AND TREATMENT PLAN EXPLAINED TO PATIENT, WHO VERBALIZES UNDERSTANDING. WILL SIGN OFF  Wilhemina Bonito. Eda Keys., M.D. [facility] Division of Gastroenterology

## 2012-01-25 NOTE — Progress Notes (Signed)
IV d/c'd.  Tele d/c'd.  Pt d/c'd to home.  Home meds and d/c instructions discussed with pt and pt son.  Both deny any questions or concerns at this time.  Pt leaving unit via wheelchair and appears in no acute distress. Nino Glow RN

## 2012-01-25 NOTE — Discharge Summary (Signed)
Physician Discharge Summary  Monica Copeland ZOX:[id number] DOB: [birth year] DOA: 01/21/2012  PCP: Aura Dials, MD, MD  Admit date: 01/21/2012 Discharge date: 01/25/2012  Discharge Diagnoses:   Asthma exacerbation  Tachycardia,SVT resolved.  Esophageal candidiasis  Acute blood loss, secondary to GI bleed.  Pyloric ulcer  Chronic back pain  H/O brain surgery  Hyperlipidemia    Discharge Condition: improved.   Disposition: Follow up with PCP.   History of present illness:  tient is a 71 year old female with history of asthma, tobacco use (quit one month ago), history of brain surgery in 1987 on chronic steroids, seizures, chronic back pain presented to [facility] ED with worsening shortness of breath and wheezing. History was obtained from the patient and her daughter present in the room. Patient lives alone at home and states that she has been getting dyspneic in the last 6 months, worse in the last 3 weeks. She states that over the last few days she was having productive cough with whitish sputum and dyspnea with wheezing. She also had some chest tightness earlier today lasting for 5 minutes with radiation to the left arm. Otherwise she denied any diaphoresis nausea or vomiting. Patient states that for last 2- 3 days she has noticed elevated heart rate with activity. In the ED patient received multiple albuterol breathing treatments and was noticed to have sinus tachycardia with a heart rate of 130s. At the time of my encounter the patient denied any chest tightness or pain. She denied any fevers or chills.   Hospital Course:  Acute Asthma exacerbation: Improving  -Patient was started on nebulizer treatment with Xopenex, IV solumedrol which subsequently was taper to chronic steroids.  She was started on avelox, she will complete a total of 5 days. She is breathing at baseline. Will discharge on Xopene.   .Tachycardia sinus one episode of SVT: Probably worsened due to albuterol nebs,  patient also had transient chest tightness with the elevated BNP (received IV fluids in ED), no prior history of coronary artery disease /CHF. PE ruled out on CT angiogram of the chest. TSH normal . cardiac enzymes x3 negative, given one dose of Lasix at the time of admission, 2-D echo normal EF.  HR in the 60. Will discharge on Xopene.   .Chronic back pain: Continue tramadol and Percocet PRN   .Hyperlipidemia: Continue statins   . History of brain surgery with chronic corticosteroid use:  - Transition her to maintenance steroids.   Acute blood loss, secondary to GI bleed.  Patient hb decrease to 6.0. She was having melena. She received 2 units PRBC. She had endoscopy that showed pyloric ulcer. Needs PPI for 6 weeks.  Candida esophagitis: Patient was started on diflucan. I will treat for total 14 days.      Discharge Exam: Filed Vitals:   01/25/12 0820  BP:   Pulse: 66  Temp:   Resp: 18   Filed Vitals:   01/24/12 1624 01/24/12 2256 01/25/12 0559 01/25/12 0820  BP: 112/52 103/69 108/54   Pulse: 54 90 60 66  Temp: 98.3 F (36.8 C) 97.6 F (36.4 C) 97.8 F (36.6 C)   TempSrc: Oral Oral Oral   Resp: 16 16 18 18   Height:      Weight:   66.089 kg (145 lb 11.2 oz)   SpO2: 100% 100% 100% 99%   General: Sitting in bed in no distress.  Cardiovascular: S1, S2 RRR.  Respiratory: CTA.   Discharge Instructions  Discharge Orders    Future  Orders Please Complete By Expires   Diet - low sodium heart healthy      Increase activity slowly        Medication List  As of 01/25/2012 11:45 AM   TAKE these medications         albuterol 108 (90 BASE) MCG/ACT inhaler   Commonly known as: PROVENTIL HFA;VENTOLIN HFA   Inhale 2 puffs into the lungs every 6 (six) hours as needed. For shortness of breath      albuterol (2.5 MG/3ML) 0.083% nebulizer solution   Commonly known as: PROVENTIL   Take 2.5 mg by nebulization every 6 (six) hours as needed. For shortness of breath       budesonide-formoterol 160-4.5 MCG/ACT inhaler   Commonly known as: SYMBICORT   Inhale 2 puffs into the lungs 2 (two) times daily.      cortisone 25 MG tablet   Commonly known as: CORTONE   Take 37.5 mg by mouth daily. Take 1 tablet in the morning and 0.5 tablet in the afternoon      ezetimibe-simvastatin 10-40 MG per tablet   Commonly known as: VYTORIN   Take 1 tablet by mouth at bedtime.      fluconazole 100 MG tablet   Commonly known as: DIFLUCAN   Take 1 tablet (100 mg total) by mouth daily.      moxifloxacin 400 MG tablet   Commonly known as: AVELOX   Take 1 tablet (400 mg total) by mouth daily.      pantoprazole 40 MG tablet   Commonly known as: PROTONIX   Take 1 tablet (40 mg total) by mouth 2 (two) times daily.      traMADol 100 MG 24 hr tablet   Commonly known as: ULTRAM-ER   Take 100 mg by mouth daily as needed. For headache or back pain      zonisamide 100 MG capsule   Commonly known as: ZONEGRAN   Take 300 mg by mouth daily. Take 1 capsule in the morning and 2 capsules in the evening              The results of significant diagnostics from this hospitalization (including imaging, microbiology, ancillary and laboratory) are listed below for reference.    Significant Diagnostic Studies: Ct Angio Chest W/cm &/or Wo Cm  01/21/2012  *RADIOLOGY REPORT*  Clinical Data: Productive cough for weeks or months.  Chronic headaches.  Wheezing.  CT ANGIOGRAPHY CHEST  Technique:  Multidetector CT imaging of the chest using the standard protocol during bolus administration of intravenous contrast. Multiplanar reconstructed images including MIPs were obtained and reviewed to evaluate the vascular anatomy.  Contrast: 80mL OMNIPAQUE IOHEXOL 350 MG/ML SOLN  Comparison: Chest x-ray 01/21/2012  Findings:  Normal heart and great vessels. Mild transverse aortic arch calcification.  Slight left main coronary artery calcification.  No pulmonary emboli.  No pleural or pericardial effusion.   No pulmonary nodules.  No hilar or mediastinal adenopathy.  Midline trachea and normal bronchi. Slight mucous plug right lower lobe bronchus (image 169, series 8). No acute osseous findings.  Visualized upper abdominal structures unremarkable.  IMPRESSION: No evidence for pneumonia or pulmonary emboli.  Mild coronary artery calcification.  Slight mucus plugging right lower lobe bronchus.  Original Report Authenticated By: Elsie Stain, M.D.   Dg Chest Portable 1 View  01/21/2012  *RADIOLOGY REPORT*  Clinical Data: Shortness of breath and cough.  Tightness in the chest.  PORTABLE CHEST - 1 VIEW  Comparison: 08/19/2011  Findings:  Patient is slightly rotated.  Heart size is probably upper limits normal.  There are no focal consolidations or pleural effusions.  No edema.  IMPRESSION: No evidence for acute cardiopulmonary abnormality.  Original Report Authenticated By: Patterson Hammersmith, M.D.    Microbiology: Recent Results (from the past 240 hour(s))  CULTURE, BLOOD (ROUTINE X 2)     Status: Normal (Preliminary result)   Collection Time   01/21/12  3:50 PM      Component Value Range Status Comment   Specimen Description BLOOD RIGHT ARM   Final    Special Requests     Final    Value: BOTTLES DRAWN AEROBIC AND ANAEROBIC 10CC BLUE 5CC RED   Culture  Setup Time [id number]   Final    Culture     Final    Value:        BLOOD CULTURE RECEIVED NO GROWTH TO DATE CULTURE WILL BE HELD FOR 5 DAYS BEFORE ISSUING A FINAL NEGATIVE REPORT   Report Status PENDING   Incomplete   CULTURE, BLOOD (ROUTINE X 2)     Status: Normal (Preliminary result)   Collection Time   01/21/12  4:00 PM      Component Value Range Status Comment   Specimen Description BLOOD LEFT HAND   Final    Special Requests     Final    Value: BOTTLES DRAWN AEROBIC AND ANAEROBIC 10CC BLUE 7CC RED   Culture  Setup Time [id number]   Final    Culture     Final    Value:        BLOOD CULTURE RECEIVED NO GROWTH TO DATE CULTURE WILL BE HELD FOR  5 DAYS BEFORE ISSUING A FINAL NEGATIVE REPORT   Report Status PENDING   Incomplete   URINE CULTURE     Status: Normal   Collection Time   01/21/12  5:19 PM      Component Value Range Status Comment   Specimen Description URINE, CLEAN CATCH   Final    Special Requests NONE   Final    Culture  Setup Time [id number]   Final    Colony Count 10,000 COLONIES/ML   Final    Culture     Final    Value: STAPHYLOCOCCUS SPECIES (COAGULASE NEGATIVE)     Note: RIFAMPIN AND GENTAMICIN SHOULD NOT BE USED AS SINGLE DRUGS FOR TREATMENT OF STAPH INFECTIONS.   Report Status 01/24/2012 FINAL   Final    Organism ID, Bacteria STAPHYLOCOCCUS SPECIES (COAGULASE NEGATIVE)   Final   URINE CULTURE     Status: Normal   Collection Time   01/21/12  9:06 PM      Component Value Range Status Comment   Specimen Description URINE, RANDOM   Final    Special Requests NONE   Final    Culture  Setup Time [id number]   Final    Colony Count NO GROWTH   Final    Culture NO GROWTH   Final    Report Status 01/23/2012 FINAL   Final   CLOSTRIDIUM DIFFICILE BY PCR     Status: Normal   Collection Time   01/23/12 10:47 AM      Component Value Range Status Comment   C difficile by pcr NEGATIVE  NEGATIVE  Final      Labs: Basic Metabolic Panel:  Lab 01/23/12 6440 01/22/12 0323 01/21/12 1552  NA 142 135 134*  K 3.5 3.7 --  CL 108 103 101  CO2 22 18* 20  GLUCOSE 138* 143* 117*  BUN 38* 68* 65*  CREATININE 1.12* 1.00 1.06  CALCIUM 8.3* 8.0* 8.6  MG 2.1 -- --  PHOS -- -- --   Liver Function Tests:  Lab 01/21/12 1552  AST 20  ALT 26  ALKPHOS 66  BILITOT 0.2*  PROT 6.3  ALBUMIN 3.2*   CBC:  Lab 01/25/12 0605 01/24/12 0605 01/23/12 0404 01/22/12 2001 01/22/12 1212 01/22/12 0323 01/21/12 1552  WBC 20.4* 31.5* -- -- -- 22.5* 23.4*  NEUTROABS -- -- -- -- -- -- 19.2*  HGB 9.5* 9.7* 6.9* 7.4* 8.1* -- --  HCT 27.4* 27.9* 20.0* 21.6* 23.3* -- --  MCV 85.4 84.5 -- -- -- 82.6 83.9  PLT 208 203 -- -- -- 290 363    Cardiac Enzymes:  Lab 01/22/12 0831 01/22/12 0322 01/21/12 2107 01/21/12 1553  CKTOTAL 45 51 62 --  CKMB 4.0 4.1* 3.4 --  CKMBINDEX -- -- -- --  TROPONINI <0.30 <0.30 <0.30 <0.30   BNP: No components found with this basename: POCBNP:5 CBG: No results found for this basename: GLUCAP:5 in the last 168 hours  Time coordinating discharge: 30 minutes.  Signed:  Tyaire Odem  [facility] 01/25/2012, 11:45 AM

## 2012-01-27 LAB — CULTURE, BLOOD (ROUTINE X 2)
Culture  Setup Time: 201305262049
Culture: NO GROWTH

## 2012-01-31 NOTE — Plan of Care (Signed)
Patient is a 71 year old female with history of asthma, tobacco use (quit one month ago), history of brain surgery in 1987 on chronic steroids, seizures, chronic back pain presented to [facility] ED with worsening shortness of breath and wheezing secondary to acute asthma exacerbation. Patient had episode of melena after the admission which prompted the GI consult and work-up. In response to CDI query, I would say GI bleed was:   NOT present at the time of inpatient admission and it developed during the inpatient stay when I did the admission encounter. However family later reported that she was having off and on Gi bleed for last 2-3 weeks but patient did not mention at the time of admission.    Lilton Pare M.D. [facility] 01/31/2012, 11:18 AM  Pager: [phone]

## 2012-03-29 ENCOUNTER — Ambulatory Visit (INDEPENDENT_AMBULATORY_CARE_PROVIDER_SITE_OTHER): Payer: Medicare Other | Admitting: Family Medicine

## 2012-03-29 ENCOUNTER — Encounter: Payer: Self-pay | Admitting: Family Medicine

## 2012-03-29 VITALS — BP 122/66 | HR 113 | Temp 98.6°F | Wt 147.0 lb

## 2012-03-29 DIAGNOSIS — R51 Headache: Secondary | ICD-10-CM

## 2012-03-29 DIAGNOSIS — IMO0002 Reserved for concepts with insufficient information to code with codable children: Secondary | ICD-10-CM

## 2012-03-29 DIAGNOSIS — F419 Anxiety disorder, unspecified: Secondary | ICD-10-CM

## 2012-03-29 DIAGNOSIS — M546 Pain in thoracic spine: Secondary | ICD-10-CM

## 2012-03-29 DIAGNOSIS — M549 Dorsalgia, unspecified: Secondary | ICD-10-CM

## 2012-03-29 DIAGNOSIS — K279 Peptic ulcer, site unspecified, unspecified as acute or chronic, without hemorrhage or perforation: Secondary | ICD-10-CM

## 2012-03-29 DIAGNOSIS — E785 Hyperlipidemia, unspecified: Secondary | ICD-10-CM

## 2012-03-29 DIAGNOSIS — G8929 Other chronic pain: Secondary | ICD-10-CM

## 2012-03-29 DIAGNOSIS — F411 Generalized anxiety disorder: Secondary | ICD-10-CM

## 2012-03-29 DIAGNOSIS — E249 Cushing's syndrome, unspecified: Secondary | ICD-10-CM

## 2012-03-29 DIAGNOSIS — Z1239 Encounter for other screening for malignant neoplasm of breast: Secondary | ICD-10-CM

## 2012-03-29 DIAGNOSIS — K254 Chronic or unspecified gastric ulcer with hemorrhage: Secondary | ICD-10-CM

## 2012-03-29 DIAGNOSIS — J45909 Unspecified asthma, uncomplicated: Secondary | ICD-10-CM

## 2012-03-29 DIAGNOSIS — E240 Pituitary-dependent Cushing's disease: Secondary | ICD-10-CM

## 2012-03-29 LAB — CBC WITH DIFFERENTIAL/PLATELET
Basophils Relative: 0 % (ref 0.0–3.0)
Eosinophils Relative: 0.2 % (ref 0.0–5.0)
HCT: 35.8 % — ABNORMAL LOW (ref 36.0–46.0)
Lymphs Abs: 1.2 10*3/uL (ref 0.7–4.0)
MCV: 77.7 fl — ABNORMAL LOW (ref 78.0–100.0)
Monocytes Absolute: 0.3 10*3/uL (ref 0.1–1.0)
RBC: 4.61 Mil/uL (ref 3.87–5.11)
WBC: 9.6 10*3/uL (ref 4.5–10.5)

## 2012-03-29 LAB — BASIC METABOLIC PANEL
BUN: 16 mg/dL (ref 6–23)
CO2: 26 mEq/L (ref 19–32)
Calcium: 9.4 mg/dL (ref 8.4–10.5)
Creatinine, Ser: 1.2 mg/dL (ref 0.4–1.2)
Glucose, Bld: 118 mg/dL — ABNORMAL HIGH (ref 70–99)
Sodium: 138 mEq/L (ref 135–145)

## 2012-03-29 LAB — HEPATIC FUNCTION PANEL
ALT: 16 U/L (ref 0–35)
Bilirubin, Direct: 0 mg/dL (ref 0.0–0.3)
Total Protein: 7.6 g/dL (ref 6.0–8.3)

## 2012-03-29 LAB — POCT URINALYSIS DIPSTICK
Bilirubin, UA: NEGATIVE
Blood, UA: NEGATIVE
Glucose, UA: NEGATIVE
Nitrite, UA: NEGATIVE

## 2012-03-29 LAB — LIPID PANEL
Cholesterol: 173 mg/dL (ref 0–200)
Total CHOL/HDL Ratio: 3
Triglycerides: 112 mg/dL (ref 0.0–149.0)

## 2012-03-29 MED ORDER — ZONISAMIDE 100 MG PO CAPS: 300.0000 mg | ORAL_CAPSULE | Freq: Every day | ORAL | Status: DC

## 2012-03-29 MED ORDER — OXYCODONE-ACETAMINOPHEN 5-325 MG PO TABS: 1.0000 | ORAL_TABLET | Freq: Three times a day (TID) | ORAL | Status: DC | PRN

## 2012-03-29 MED ORDER — CORTISONE ACETATE 25 MG PO TABS: 37.5000 mg | ORAL_TABLET | Freq: Every day | ORAL | Status: DC

## 2012-03-29 MED ORDER — PANTOPRAZOLE SODIUM 40 MG PO TBEC: 40.0000 mg | DELAYED_RELEASE_TABLET | Freq: Two times a day (BID) | ORAL | Status: DC

## 2012-03-29 MED ORDER — ALPRAZOLAM ER 0.5 MG PO TB24: 0.5000 mg | ORAL_TABLET | Freq: Every evening | ORAL | Status: DC | PRN

## 2012-03-29 MED ORDER — EZETIMIBE-SIMVASTATIN 10-40 MG PO TABS: 1.0000 | ORAL_TABLET | Freq: Every day | ORAL | Status: DC

## 2012-03-29 MED ORDER — PAROXETINE HCL 30 MG PO TABS: 30.0000 mg | ORAL_TABLET | ORAL | Status: DC

## 2012-03-29 MED ORDER — BUDESONIDE-FORMOTEROL FUMARATE 160-4.5 MCG/ACT IN AERO: 2.0000 | INHALATION_SPRAY | Freq: Two times a day (BID) | RESPIRATORY_TRACT | Status: DC

## 2012-03-29 NOTE — Patient Instructions (Addendum)
Cushing's Syndrome Cushing's syndrome, also called hypercortisolism, is a rare endocrine disorder characterized by a variety of symptoms (problems) and physical abnormalities. It may be caused by either prolonged exposure of the body's tissues to high levels of the hormone cortisol or by the overproduction of cortisol in the body. Cortisol is a natural substance produced by the adrenal gland. It can also be produced synthetically. Common features of Cushing's syndrome include upper body obesity, severe fatigue and muscle weakness, high blood pressure, backache, elevated blood sugar, easy bruising, and bluish-red stretch marks on the skin. In women, there may be increased growth of facial and body hair, and menstrual periods may become irregular or stop completely. Exposure to too much cortisol can occur for different reasons such as long-term use of glucocorticoid hormones to treat inflammatory illnesses; pituitary adenomas (benign (non cancerous) tumors (lumps) of the pituitary glands) which secrete increased amounts of adrenocorticotropic hormone (ACTH); ectopic ACTH syndrome (a condition in which ACTH is produced by various types of potentially malignant (cancerous) tumors that occur in different parts of the body); and adrenal tumors (tumors of the adrenal glands). TREATMENT  Treatment of Cushing's syndrome depends on the cause of the overproduction of cortisol. If the cause is long-term use of a medication being used to treat another disorder, the physician may reduce the dosage until symptoms are under control. Surgery or radiotherapy may be used to treat pituitary adenomas. Surgery, radiotherapy, chemotherapy, immunotherapy, or a combination of these may be used to treat ectopic ACTH syndrome. The aim of treatment is to cure the hypercortisolism and to eliminate any tumor that threatens the individual's health, while minimizing the chance of endocrine deficiency or long-term dependence on  medications. PROGNOSIS The prognosis (likely outcome) for individuals with Cushing's syndrome varies depending on the cause of overproduction of cortisol. With treatment, most individuals with Cushing's syndrome show significant improvement, while improvement for others may be complicated by various aspects of the causative illness. Some kinds of tumors may recur. Most cases of Cushing's can be cured. Document Released: 08/04/2002 Document Revised: 08/03/2011 Document Reviewed: 08/14/2005 [facility] Patient Information 2012 [place], [place].

## 2012-03-29 NOTE — Progress Notes (Signed)
  Subjective:    Patient ID: Monica Copeland, female    DOB: 02/27/1941, 71 y.o.   MRN: [id number]  HPI    Review of Systems     Objective:   Physical Exam        Assessment & Plan:

## 2012-03-31 DIAGNOSIS — E249 Cushing's syndrome, unspecified: Secondary | ICD-10-CM | POA: Insufficient documentation

## 2012-03-31 NOTE — Assessment & Plan Note (Signed)
Thoracic pain=--- check xray -- r/o compression fx

## 2012-03-31 NOTE — Assessment & Plan Note (Signed)
Check labs con't meds 

## 2012-03-31 NOTE — Assessment & Plan Note (Signed)
con't meds Refer endo Pt will need records sent

## 2012-03-31 NOTE — Assessment & Plan Note (Signed)
con't meds 

## 2012-04-01 ENCOUNTER — Other Ambulatory Visit: Payer: Self-pay | Admitting: Family Medicine

## 2012-04-01 DIAGNOSIS — F419 Anxiety disorder, unspecified: Secondary | ICD-10-CM

## 2012-04-01 MED ORDER — ALPRAZOLAM 0.5 MG PO TABS
0.5000 mg | ORAL_TABLET | Freq: Every evening | ORAL | Status: DC | PRN
Start: 2012-04-01 — End: 2012-05-22

## 2012-04-03 ENCOUNTER — Encounter: Payer: Self-pay | Admitting: Family Medicine

## 2012-04-09 ENCOUNTER — Telehealth: Payer: Self-pay | Admitting: Family Medicine

## 2012-04-09 NOTE — Telephone Encounter (Signed)
[name]: Ama/Patient; Patient Name: Monica Copeland; PCP: Lelon Perla.; Best Callback Phone Number: [phone]. [name] reports she has had coughing and congestion since Sat 04/06/12. Taking Robitussin with minimal relief. Using her asthma meds as directed. Afebrile. [name] now reports, "After I called you, I began feel better." She declines triage at this time, stating, I will continue with asthma meds and callback prn. Does not feel like she needs an appt. [name] advised to callback prn and is agreeable.

## 2012-04-15 ENCOUNTER — Ambulatory Visit (HOSPITAL_BASED_OUTPATIENT_CLINIC_OR_DEPARTMENT_OTHER)
Admission: RE | Admit: 2012-04-15 | Discharge: 2012-04-15 | Disposition: A | Discharge status: Home / Self Care | Payer: Medicare Other | Source: Ambulatory Visit | Attending: Family Medicine | Admitting: Family Medicine

## 2012-04-15 ENCOUNTER — Ambulatory Visit (INDEPENDENT_AMBULATORY_CARE_PROVIDER_SITE_OTHER): Payer: Medicare Other | Admitting: Family Medicine

## 2012-04-15 ENCOUNTER — Other Ambulatory Visit: Payer: Self-pay | Admitting: Family Medicine

## 2012-04-15 ENCOUNTER — Encounter: Payer: Self-pay | Admitting: Family Medicine

## 2012-04-15 VITALS — BP 120/60 | HR 86 | Temp 97.3°F | Wt 148.0 lb

## 2012-04-15 DIAGNOSIS — J45909 Unspecified asthma, uncomplicated: Secondary | ICD-10-CM

## 2012-04-15 DIAGNOSIS — R0989 Other specified symptoms and signs involving the circulatory and respiratory systems: Secondary | ICD-10-CM | POA: Insufficient documentation

## 2012-04-15 DIAGNOSIS — M549 Dorsalgia, unspecified: Secondary | ICD-10-CM

## 2012-04-15 DIAGNOSIS — J40 Bronchitis, not specified as acute or chronic: Secondary | ICD-10-CM

## 2012-04-15 DIAGNOSIS — Z9889 Other specified postprocedural states: Secondary | ICD-10-CM

## 2012-04-15 DIAGNOSIS — J45901 Unspecified asthma with (acute) exacerbation: Secondary | ICD-10-CM

## 2012-04-15 DIAGNOSIS — J4489 Other specified chronic obstructive pulmonary disease: Secondary | ICD-10-CM | POA: Insufficient documentation

## 2012-04-15 DIAGNOSIS — R0602 Shortness of breath: Secondary | ICD-10-CM | POA: Insufficient documentation

## 2012-04-15 DIAGNOSIS — R059 Cough, unspecified: Secondary | ICD-10-CM | POA: Insufficient documentation

## 2012-04-15 DIAGNOSIS — R05 Cough: Secondary | ICD-10-CM | POA: Insufficient documentation

## 2012-04-15 DIAGNOSIS — M546 Pain in thoracic spine: Secondary | ICD-10-CM | POA: Insufficient documentation

## 2012-04-15 DIAGNOSIS — J449 Chronic obstructive pulmonary disease, unspecified: Secondary | ICD-10-CM | POA: Insufficient documentation

## 2012-04-15 DIAGNOSIS — G8929 Other chronic pain: Secondary | ICD-10-CM

## 2012-04-15 MED ORDER — ALBUTEROL SULFATE (5 MG/ML) 0.5% IN NEBU
2.5000 mg | INHALATION_SOLUTION | Freq: Once | RESPIRATORY_TRACT | Status: AC
  Administered 2012-04-15: 2.5 mg via RESPIRATORY_TRACT

## 2012-04-15 MED ORDER — AZITHROMYCIN 250 MG PO TABS: ORAL_TABLET | ORAL | Status: AC

## 2012-04-15 MED ORDER — PREDNISONE 10 MG PO TABS: ORAL_TABLET | ORAL | Status: DC

## 2012-04-15 MED ORDER — IPRATROPIUM BROMIDE 0.02 % IN SOLN
0.5000 mg | Freq: Once | RESPIRATORY_TRACT | Status: AC
  Administered 2012-04-15: 0.5 mg via RESPIRATORY_TRACT

## 2012-04-15 MED ORDER — METHYLPREDNISOLONE ACETATE 80 MG/ML IJ SUSP
80.0000 mg | Freq: Once | INTRAMUSCULAR | Status: AC
  Administered 2012-04-15: 80 mg via INTRAMUSCULAR

## 2012-04-15 NOTE — Addendum Note (Signed)
Addended by: Arnette Norris on: 04/15/2012 03:57 PM   Modules accepted: Orders

## 2012-04-15 NOTE — Assessment & Plan Note (Signed)
Inc fall risk secondary to balance problems

## 2012-04-15 NOTE — Addendum Note (Signed)
Addended by: Lelon Perla on: 04/15/2012 03:56 PM   Modules accepted: Orders

## 2012-04-15 NOTE — Assessment & Plan Note (Signed)
pred taper Depo medrol z pak con't neb/ inhalers  F/u 2 weeks or sooner prn

## 2012-04-15 NOTE — Patient Instructions (Addendum)

## 2012-04-15 NOTE — Progress Notes (Signed)
  Subjective:    Patient ID: Monica Copeland, female    DOB: 1941-04-11, 71 y.o.   MRN: [id number]  HPI Pt here for her face to face power wheelchair examination.  Pt needs the power wheelchair to do ADLS --- ex to go from room to room.  She is unable to use a cane or walker because of her being a high fall risk seconday to balance problems and she desaturates to 90% with exertion and gets very SOB.   She is unable to use a manual wheelchair because of upper ext weakness.  The patient is unable to use a scooter because it is not appropriate for the home environment.  It would also be hard for the pt to transfer in an out of the scooter.   She is physically and mentally capable of using the power wheelchair in her home.  Pt also has problems with back pain --  9/10 per patient. Pt is also very SOB and wheezing with congestion 2 weeks.  Pt is using her nebulizer and inhalers.     Review of Systems As above    Objective:   Physical Exam  Constitutional: She is oriented to person, place, and time. She appears well-developed and well-nourished.  Neck: Normal range of motion. Neck supple.  Cardiovascular: Normal rate and regular rhythm.   Pulmonary/Chest: She is in respiratory distress. She has wheezes.       + mild resp distress   Musculoskeletal:       ue strength--  3/5 Low ext strength--  3/5  Neurological: She is alert and oriented to person, place, and time.  Psychiatric: She has a normal mood and affect. Her behavior is normal. Judgment and thought content normal.   Filed Vitals:   04/15/12 1454  BP: 120/60  Pulse: 86  Temp: 97.3 F (36.3 C)  TempSrc: Oral  Weight: 148 lb (67.132 kg)  SpO2: 90%  with exertion SpO2  98% at rest      Assessment & Plan:

## 2012-04-15 NOTE — Assessment & Plan Note (Signed)
con't chronic meds

## 2012-04-15 NOTE — Assessment & Plan Note (Signed)
On ultram Power wheelchair ordered

## 2012-04-19 ENCOUNTER — Encounter: Payer: Self-pay | Admitting: Family Medicine

## 2012-04-19 ENCOUNTER — Ambulatory Visit (INDEPENDENT_AMBULATORY_CARE_PROVIDER_SITE_OTHER): Payer: Medicare Other | Admitting: Family Medicine

## 2012-04-19 ENCOUNTER — Telehealth: Payer: Self-pay | Admitting: Family Medicine

## 2012-04-19 VITALS — BP 122/62 | HR 84 | Temp 98.2°F | Wt 148.0 lb

## 2012-04-19 DIAGNOSIS — M545 Low back pain, unspecified: Secondary | ICD-10-CM

## 2012-04-19 DIAGNOSIS — M546 Pain in thoracic spine: Secondary | ICD-10-CM

## 2012-04-19 DIAGNOSIS — M549 Dorsalgia, unspecified: Secondary | ICD-10-CM

## 2012-04-19 DIAGNOSIS — J189 Pneumonia, unspecified organism: Secondary | ICD-10-CM

## 2012-04-19 DIAGNOSIS — G8929 Other chronic pain: Secondary | ICD-10-CM

## 2012-04-19 MED ORDER — OXYCODONE-ACETAMINOPHEN 5-325 MG PO TABS: 1.0000 | ORAL_TABLET | Freq: Three times a day (TID) | ORAL | Status: DC | PRN

## 2012-04-19 MED ORDER — NONFORMULARY OR COMPOUNDED ITEM: Status: DC

## 2012-04-19 MED ORDER — PRAMIPEXOLE DIHYDROCHLORIDE 1 MG PO TABS: 1.0000 mg | ORAL_TABLET | Freq: Every day | ORAL | Status: DC

## 2012-04-19 MED ORDER — MOXIFLOXACIN HCL 400 MG PO TABS: 400.0000 mg | ORAL_TABLET | Freq: Every day | ORAL | Status: AC

## 2012-04-19 NOTE — Telephone Encounter (Signed)
Please advise      KP 

## 2012-04-19 NOTE — Progress Notes (Signed)
  Subjective:    Monica Copeland is a 70 y.o. female who presents for follow up of low back problems. Current symptoms include: cramps in both legs. Symptoms have worsened from the previous visit. Exacerbating factors identified by the patient are recumbency. Pt also here f/u pneumonia.   Pt is feeling better. The following portions of the patient's history were reviewed and updated as appropriate: allergies, current medications, past family history, past medical history, past social history, past surgical history and problem list.    Objective:    BP 122/62  Pulse 84  Temp 98.2 F (36.8 C) (Oral)  Wt 148 lb (67.132 kg)  SpO2 97% General appearance: alert, cooperative, appears stated age and no distress Neck: no adenopathy, supple, symmetrical, trachea midline and thyroid not enlarged, symmetric, no tenderness/mass/nodules Lungs: rhonchi bilaterally and wheezes bilaterally Extremities: extremities normal, atraumatic, no cyanosis or edema Neurologic: Motor: 3/5 R hip flexor and R foot plantar flexion Reflexes: 2+ and symmetric Coordination: normal Gait: Antalgic    Assessment:       Plan:

## 2012-04-19 NOTE — Telephone Encounter (Signed)
Apt scheduled for today.      KP 

## 2012-04-19 NOTE — Telephone Encounter (Signed)
Yes  #30  1 po qhs   11 refills

## 2012-04-19 NOTE — Telephone Encounter (Signed)
Refill: Pramipexole 1mg  tablet. Take 1 to 2 tablets by mouth at bedtime. Qty 60. Last fill 12-25-11

## 2012-04-19 NOTE — Telephone Encounter (Signed)
[name]: [name]/Patient; Patient Name: Monica Copeland; PCP: Lelon Perla.; Best Callback Phone Number: [phone]        Started rectal bleeding during the night then blood in 2 stools this morning.  Ruled out critical symptoms.  Triaged in Gastrointestinal Bleeding Guideline - Disposition:  See Provider Within 4 Hours due to one or more episodes of rectal bleeding more than scant and no symptoms of hypovolemia.  Already has appointment at 1:30 pm today 8/23.

## 2012-04-19 NOTE — Patient Instructions (Addendum)

## 2012-04-20 DIAGNOSIS — J189 Pneumonia, unspecified organism: Secondary | ICD-10-CM | POA: Insufficient documentation

## 2012-04-20 NOTE — Assessment & Plan Note (Signed)
Change abx avelox 1 po qd  con't nebs and inhalers Recheck xray next week

## 2012-04-20 NOTE — Assessment & Plan Note (Addendum)
Forms for back brace filled out Refilled pain meds Consider pain management but will wait until pneumonia resolved

## 2012-04-23 ENCOUNTER — Other Ambulatory Visit: Payer: Medicare Other

## 2012-04-23 ENCOUNTER — Ambulatory Visit: Payer: Medicare Other

## 2012-04-24 ENCOUNTER — Observation Stay (HOSPITAL_BASED_OUTPATIENT_CLINIC_OR_DEPARTMENT_OTHER)
Admission: EM | Admit: 2012-04-24 | Discharge: 2012-04-25 | Disposition: A | Discharge status: Home / Self Care | Payer: Medicare Other | Attending: Emergency Medicine | Admitting: Emergency Medicine

## 2012-04-24 ENCOUNTER — Telehealth: Payer: Self-pay | Admitting: Family Medicine

## 2012-04-24 ENCOUNTER — Telehealth: Payer: Self-pay | Admitting: *Deleted

## 2012-04-24 ENCOUNTER — Emergency Department (HOSPITAL_BASED_OUTPATIENT_CLINIC_OR_DEPARTMENT_OTHER): Payer: Medicare Other

## 2012-04-24 ENCOUNTER — Encounter (HOSPITAL_BASED_OUTPATIENT_CLINIC_OR_DEPARTMENT_OTHER): Payer: Self-pay | Admitting: *Deleted

## 2012-04-24 DIAGNOSIS — F419 Anxiety disorder, unspecified: Secondary | ICD-10-CM

## 2012-04-24 DIAGNOSIS — R51 Headache: Secondary | ICD-10-CM

## 2012-04-24 DIAGNOSIS — G459 Transient cerebral ischemic attack, unspecified: Principal | ICD-10-CM | POA: Insufficient documentation

## 2012-04-24 DIAGNOSIS — M546 Pain in thoracic spine: Secondary | ICD-10-CM

## 2012-04-24 DIAGNOSIS — G8190 Hemiplegia, unspecified affecting unspecified side: Secondary | ICD-10-CM | POA: Diagnosis present

## 2012-04-24 DIAGNOSIS — J449 Chronic obstructive pulmonary disease, unspecified: Secondary | ICD-10-CM | POA: Diagnosis present

## 2012-04-24 DIAGNOSIS — J189 Pneumonia, unspecified organism: Secondary | ICD-10-CM

## 2012-04-24 DIAGNOSIS — R471 Dysarthria and anarthria: Secondary | ICD-10-CM | POA: Insufficient documentation

## 2012-04-24 DIAGNOSIS — E240 Pituitary-dependent Cushing's disease: Secondary | ICD-10-CM

## 2012-04-24 DIAGNOSIS — R29898 Other symptoms and signs involving the musculoskeletal system: Secondary | ICD-10-CM | POA: Insufficient documentation

## 2012-04-24 DIAGNOSIS — R4701 Aphasia: Secondary | ICD-10-CM | POA: Insufficient documentation

## 2012-04-24 DIAGNOSIS — E785 Hyperlipidemia, unspecified: Secondary | ICD-10-CM

## 2012-04-24 DIAGNOSIS — J45909 Unspecified asthma, uncomplicated: Secondary | ICD-10-CM

## 2012-04-24 DIAGNOSIS — K279 Peptic ulcer, site unspecified, unspecified as acute or chronic, without hemorrhage or perforation: Secondary | ICD-10-CM

## 2012-04-24 HISTORY — DX: Pituitary-dependent Cushing's disease: E24.0

## 2012-04-24 LAB — CBC
HCT: 30.3 % — ABNORMAL LOW (ref 36.0–46.0)
Hemoglobin: 9.6 g/dL — ABNORMAL LOW (ref 12.0–15.0)
WBC: 12.6 10*3/uL — ABNORMAL HIGH (ref 4.0–10.5)

## 2012-04-24 LAB — DIFFERENTIAL
Lymphocytes Relative: 13 % (ref 12–46)
Monocytes Absolute: 1 10*3/uL (ref 0.1–1.0)
Monocytes Relative: 8 % (ref 3–12)
Neutro Abs: 10 10*3/uL — ABNORMAL HIGH (ref 1.7–7.7)

## 2012-04-24 LAB — CK TOTAL AND CKMB (NOT AT ARMC)
CK, MB: 1.6 ng/mL (ref 0.3–4.0)
Total CK: 48 U/L (ref 7–177)

## 2012-04-24 LAB — APTT: aPTT: 26 seconds (ref 24–37)

## 2012-04-24 LAB — LIPID PANEL
Cholesterol: 202 mg/dL — ABNORMAL HIGH (ref 0–200)
Total CHOL/HDL Ratio: 3.4 RATIO
VLDL: 39 mg/dL (ref 0–40)

## 2012-04-24 LAB — COMPREHENSIVE METABOLIC PANEL
AST: 21 U/L (ref 0–37)
BUN: 21 mg/dL (ref 6–23)
CO2: 25 mEq/L (ref 19–32)
Chloride: 102 mEq/L (ref 96–112)
Creatinine, Ser: 1.2 mg/dL — ABNORMAL HIGH (ref 0.50–1.10)
GFR calc non Af Amer: 44 mL/min — ABNORMAL LOW (ref >90)
Total Bilirubin: 0.3 mg/dL (ref 0.3–1.2)

## 2012-04-24 LAB — RAPID URINE DRUG SCREEN, HOSP PERFORMED
Amphetamines: NOT DETECTED
Barbiturates: NOT DETECTED
Cocaine: NOT DETECTED
Tetrahydrocannabinol: NOT DETECTED

## 2012-04-24 LAB — URINALYSIS, ROUTINE W REFLEX MICROSCOPIC
Bilirubin Urine: NEGATIVE
Ketones, ur: NEGATIVE mg/dL
Nitrite: NEGATIVE
Protein, ur: NEGATIVE mg/dL
Specific Gravity, Urine: 1.02 (ref 1.005–1.030)
Urobilinogen, UA: 0.2 mg/dL (ref 0.0–1.0)

## 2012-04-24 LAB — LACTIC ACID, PLASMA: Lactic Acid, Venous: 1.4 mmol/L (ref 0.5–2.2)

## 2012-04-24 MED ORDER — PRAMIPEXOLE DIHYDROCHLORIDE 1 MG PO TABS
1.0000 mg | ORAL_TABLET | Freq: Every day | ORAL | Status: DC
  Administered 2012-04-24: 1 mg via ORAL
  Filled 2012-04-24 (×2): qty 1

## 2012-04-24 MED ORDER — OXYCODONE-ACETAMINOPHEN 5-325 MG PO TABS
1.0000 | ORAL_TABLET | Freq: Three times a day (TID) | ORAL | Status: DC | PRN
  Filled 2012-04-24: qty 1

## 2012-04-24 MED ORDER — MOXIFLOXACIN HCL 400 MG PO TABS
400.0000 mg | ORAL_TABLET | Freq: Every day | ORAL | Status: DC
  Administered 2012-04-24 – 2012-04-25 (×2): 400 mg via ORAL
  Filled 2012-04-24 (×2): qty 1

## 2012-04-24 MED ORDER — PAROXETINE HCL 30 MG PO TABS
30.0000 mg | ORAL_TABLET | ORAL | Status: DC
  Administered 2012-04-25: 30 mg via ORAL
  Filled 2012-04-24 (×3): qty 1

## 2012-04-24 MED ORDER — ASPIRIN 325 MG PO TABS
325.0000 mg | ORAL_TABLET | Freq: Every day | ORAL | Status: DC
  Administered 2012-04-24 – 2012-04-25 (×2): 325 mg via ORAL
  Filled 2012-04-24 (×2): qty 1

## 2012-04-24 MED ORDER — ALBUTEROL SULFATE (5 MG/ML) 0.5% IN NEBU
2.5000 mg | INHALATION_SOLUTION | RESPIRATORY_TRACT | Status: DC
  Administered 2012-04-24 – 2012-04-25 (×3): 2.5 mg via RESPIRATORY_TRACT
  Filled 2012-04-24 (×3): qty 0.5

## 2012-04-24 MED ORDER — BUDESONIDE-FORMOTEROL FUMARATE 160-4.5 MCG/ACT IN AERO
2.0000 | INHALATION_SPRAY | Freq: Two times a day (BID) | RESPIRATORY_TRACT | Status: DC
  Administered 2012-04-25 (×2): 2 via RESPIRATORY_TRACT
  Filled 2012-04-24 (×3): qty 6

## 2012-04-24 MED ORDER — ZONISAMIDE 100 MG PO CAPS
300.0000 mg | ORAL_CAPSULE | Freq: Every day | ORAL | Status: DC
  Administered 2012-04-25 (×2): 300 mg via ORAL
  Filled 2012-04-24 (×4): qty 3

## 2012-04-24 MED ORDER — CORTISONE ACETATE 25 MG PO TABS
37.5000 mg | ORAL_TABLET | Freq: Every day | ORAL | Status: DC
  Filled 2012-04-24 (×3): qty 2

## 2012-04-24 MED ORDER — PANTOPRAZOLE SODIUM 40 MG PO TBEC
40.0000 mg | DELAYED_RELEASE_TABLET | Freq: Two times a day (BID) | ORAL | Status: DC
  Administered 2012-04-24 – 2012-04-25 (×2): 40 mg via ORAL
  Filled 2012-04-24 (×2): qty 1

## 2012-04-24 MED ORDER — ALPRAZOLAM 0.5 MG PO TABS: 0.5000 mg | ORAL_TABLET | Freq: Every evening | ORAL | Status: DC | PRN

## 2012-04-24 MED ORDER — EZETIMIBE-SIMVASTATIN 10-40 MG PO TABS
1.0000 | ORAL_TABLET | Freq: Every day | ORAL | Status: DC
  Administered 2012-04-24: 1 via ORAL
  Filled 2012-04-24 (×2): qty 1

## 2012-04-24 MED ORDER — ALBUTEROL SULFATE (5 MG/ML) 0.5% IN NEBU: 2.5000 mg | INHALATION_SOLUTION | RESPIRATORY_TRACT | Status: DC | PRN

## 2012-04-24 MED ORDER — IPRATROPIUM BROMIDE 0.02 % IN SOLN
0.5000 mg | RESPIRATORY_TRACT | Status: DC
  Administered 2012-04-24 – 2012-04-25 (×3): 0.5 mg via RESPIRATORY_TRACT
  Filled 2012-04-24 (×3): qty 2.5

## 2012-04-24 NOTE — ED Notes (Signed)
Patients daughter cindy notified of room number that patient was transfering too at [phone]

## 2012-04-24 NOTE — ED Notes (Signed)
Pt is from independent living , sent here for slurred speech and unsteady gate, last seen normal last night,  left side weakness

## 2012-04-24 NOTE — ED Notes (Signed)
Md made of aware of pt

## 2012-04-24 NOTE — Telephone Encounter (Signed)
RN Liason form called that went out for BP check today on pt. BP was 125/74, pulse 95.  Earlier today pt had slurred speech and was "stumbling around". When RN went out to assess pt, pt did not have slurred speech but did have noticeable weakness on left side of body compared to right side. Consulted with Dr. Beverely Low who advised pt to go to ER now. RN informed and suggested same earlier today but pt refused. RN will contact pt to inform pt to go to ER now.

## 2012-04-24 NOTE — ED Provider Notes (Signed)
Monica Copeland is a 71 y.o. female transferred from [name] under the care of Dr. Alto Denver to the CDU for TIA protocol and MRI. Patient had difficulty with slurred speech  at awaking this morning at 6 AM.  She also had left lower extremity weakness this a.m. Last seen normal before bedtime on 8/27. She has no history of CVA or TIA and she lives independently in her own apartment. Patient is currently being treated for pneumonia with Avelox and steroids she has a slight white blood cell count at 12.6 which may be due to her steroid treatment. CT head at [name] showed no intra-cerebral abnormality. Patient's ABCD2 score is 5 with moderate risk. She takes a full dose aspirin daily.  11:05 PM patient resting comfortably in her room eating dinner and watching TV. She is alert and oriented x3. She is in no acute distress. Heart sounds are regular rate and rhythm with no murmurs rubs or gallops. The lungs are clear to auscultation bilaterally. Abdomen is nontender to palpation. Pupils are equal round and reactive to light. Cranial nerves III through XII are grossly intact, however her strength is 2/5 to both the left upper and Left lower extremity. She has difficulty with finger to nose and heel-to-shin testing on the left side. However, rapid alternating movements and pronator drift are normal. Gait and Romberg not evaluated.   Concern for focal progressive symptoms. Patient's left lower extremity weakness had resolved but on exam she is significantly weaker in both left upper and lower extremity. I will admit her to internal medicine and consult neurology.  Neurology consult from Dr. Thad Ranger appreciated: She feels the upper extremity strength is normal and that their is slight left lower extremity weakness. At this point she recommends to continue with TIA work up. I advised Dr. Adela Glimpse of neurologist assessment and that the Pt will be kept in CDU for completion of MRI, Echo and carotid dopplers.    Gave report to Dr. Dierdre Highman of pod B/D at shift change.    Wynetta Emery, PA-C 04/25/12 [phone]

## 2012-04-24 NOTE — Telephone Encounter (Signed)
[name]: Sherina/Patient; Patient Name: Monica Copeland; PCP: Lelon Perla.; Best Callback Phone Number: [phone]. Caregiver called to report > weakness L side (leg and arm) and slurred speech. Spoke with patient since caregiver is gone. Weakness of L side remains and is "off balance." Hard to understand; states speech was completely jumbled this morning but has improved.  Advised to call 911 now for acute onset of weakess or paralysis associated with loss of coordination of any part of the body per Weakness or Paralysis guideline.  Agreed to call 911 after risks of delayed care discussed.

## 2012-04-24 NOTE — ED Notes (Signed)
Family at bedside. 

## 2012-04-24 NOTE — ED Notes (Signed)
Daughter states she last spoke with her mother last night and "she seemed OK". Pt states that she started "feeling funny:" around 8:00 this morning, she did not call her daughter until this evening. Pt smile is symmetrical, per daughter her speech seems slower and more garbled. Pt is weaker in left arm and left leg than right. Pt denies any ha, visual changes or pain.

## 2012-04-24 NOTE — ED Notes (Signed)
Report given to RN lee for cdu room 2

## 2012-04-24 NOTE — ED Notes (Signed)
CDU TIA protocol  Patient is a 71 year old female who presented today with approximately one hour of left lower extremity weakness accompanied by dysarthria and difficulty with balance. Symptoms have resolved at this time. Patient is admitted with only baseline slight bilateral lower extremity weakness left worse than right.  Neuro: Cranial nerves 2-12 intact, no abnormalities each, no facial droop, no limb ataxia noted in the bilateral upper extremities. Slight bilateral lower extremity weakness noted bilaterally left greater than right. Patient will accomplish heel to shin test bilaterally more difficulty performing with left lower leg. No sensory deficits noted.  Plan: Patient to be admitted to TIA protocol for further workup including MRI, echo, and carotid Dopplers. Holding orders as well as home medications ordered. Of note patient will need to complete her home antibiotics including Avelox for recent pneumonia.  Cyndra Numbers, MD 04/24/12 2351

## 2012-04-24 NOTE — ED Provider Notes (Signed)
History     CSN: [id number]  Arrival date & time 04/24/12  1750   First MD Initiated Contact with Patient 04/24/12 1811      Chief Complaint  Patient presents with  . Aphasia    (Consider location/radiation/quality/duration/timing/severity/associated sxs/prior treatment) HPI Patient is a 71 year old female who presents today with family for evaluation of neurologic symptoms. Patient awoke this morning at 6 AM and noted that she had slurred speech as well as some left lower chimney weakness. Patient is certain that she was normal when she went to bed at 8 PM last night. She reports getting up several times during the night but it is difficult to elicit a definite last time normal from the patient. She admits she has had some ongoing issues with left lower extremity weakness with slurred speech was completely new. Patient denied any changes in sensation as well as any upper extremity weakness. Patient reports that she was "staggering around". Patient was able to ambulate without assistance upon arrival here. Patient has no history of TIA or stroke. She has recently been followed by her primary care physician for a pneumonia. She has 3 days left of Avelox for this. Patient also has history of asthma and has recently been treated with prednisone for this. She completed that last week. She denies any chest pain, shortness of breath, or palpitations. Patient has no history of irregular heart rate and is not on any anticoagulation. Her symptoms lasted over an hour but have now resolved per patient. Patient normally lives independently in an apartment. She is alert and oriented. There are no other associated or modifying factors. Past Medical History  Diagnosis Date  . Asthma   . Back pain   . Seizures   . Anxiety   . Cushing disease     Past Surgical History  Procedure Date  . Brain surgery 1987  . Colon surgery   . Abdominal hysterectomy   . Appendectomy     Family History  Problem  Relation Age of Onset  . Hypertension Mother   . Hypertension Brother   . Heart attack Mother   . Diabetes Father     History  Substance Use Topics  . Smoking status: Former Smoker -- 1.0 packs/day    Types: Cigarettes    Quit date: 12/27/2011  . Smokeless tobacco: Never Used  . Alcohol Use: No    OB History    Grav Para Term Preterm Abortions TAB SAB Ect Mult Living                  Review of Systems  HENT: Negative.   Eyes: Negative.   Respiratory: Positive for cough.   Cardiovascular: Negative.   Gastrointestinal: Negative.   Genitourinary: Negative.   Musculoskeletal: Negative.   Skin: Negative.   Neurological: Positive for speech difficulty and weakness.  Hematological: Negative.   Psychiatric/Behavioral: Negative.   All other systems reviewed and are negative.    Allergies  Sleep aid and Tomato  Home Medications   Current Outpatient Rx  Name Route Sig Dispense Refill  . ALPRAZOLAM 0.5 MG PO TABS Oral Take 1 tablet (0.5 mg total) by mouth at bedtime as needed for sleep. 30 tablet 0  . BUDESONIDE-FORMOTEROL FUMARATE 160-4.5 MCG/ACT IN AERO Inhalation Inhale 2 puffs into the lungs 2 (two) times daily. 1 Inhaler 11  . CORTISONE ACETATE 25 MG PO TABS Oral Take 1.5 tablets (37.5 mg total) by mouth daily. Take 1 tablet in the morning and 0.5 tablet  in the afternoon 45 tablet 5  . EZETIMIBE-SIMVASTATIN 10-40 MG PO TABS Oral Take 1 tablet by mouth at bedtime. 30 tablet 5  . LEVALBUTEROL HCL 0.63 MG/3ML IN NEBU Nebulization Take 3 mLs (0.63 mg total) by nebulization every 6 (six) hours as needed for wheezing or shortness of breath. 3 mL 2    Developed svt with albuterol  . MOXIFLOXACIN HCL 400 MG PO TABS Oral Take 1 tablet (400 mg total) by mouth daily. 10 tablet 0  . OXYCODONE-ACETAMINOPHEN 5-325 MG PO TABS Oral Take 1 tablet by mouth every 8 (eight) hours as needed for pain. 60 tablet 0  . PANTOPRAZOLE SODIUM 40 MG PO TBEC Oral Take 1 tablet (40 mg total) by mouth 2  (two) times daily. 60 tablet 11  . PAROXETINE HCL 30 MG PO TABS Oral Take 1 tablet (30 mg total) by mouth every morning. 30 tablet 11  . PRAMIPEXOLE DIHYDROCHLORIDE 1 MG PO TABS Oral Take 1 tablet (1 mg total) by mouth at bedtime. 30 tablet 11  . PREDNISONE 10 MG PO TABS  3 po qd for 3 days then 2 po qd for 3 days the 1 po qd for 3 days 18 tablet 0  . ZONISAMIDE 100 MG PO CAPS Oral Take 3 capsules (300 mg total) by mouth daily. Take 1 capsule in the morning and 2 capsules in the evening 90 capsule 2    BP 116/53  Pulse 77  Temp 97.9 F (36.6 C) (Oral)  Resp 21  Ht 5' (1.524 m)  Wt 140 lb (63.504 kg)  BMI 27.34 kg/m2  SpO2 98%  Physical Exam  Nursing note and vitals reviewed. GEN: Well-developed, well-nourished female in no distress HEENT: Atraumatic, normocephalic. Oropharynx clear without erythema EYES: PERRLA BL, no scleral icterus. NECK: Trachea midline, no meningismus CV: regular rate and rhythm. No murmurs, rubs, or gallops PULM: No respiratory distress.  No crackles, wheezes, or rales. Diminished throughout. GI: soft, non-tender. No guarding, rebound, or tenderness. + bowel sounds  GU: deferred Neuro: cranial nerves grossly 2-12 intact, no facial droop, no slurred speech, no abnormalities of sensation, A and O x 3, no pronator drift, patient has bilateral lower extremity weakness at baseline. Patient is able to lift both of the bilateral lower extremities independently. She is able to perform heel-to-shin task bilaterally though better using the right leg than the left. Patient does report some left lower extremity weakness at baseline. She has no limb ataxia in the upper extremities. MSK: Patient moves all 4 extremities symmetrically, no deformity, edema, or injury noted Skin: No rashes petechiae, purpura, or jaundice Psych: no abnormality of mood   ED Course  Procedures (including critical care time)  Indication: Weakness Please note this EKG was reviewed extemporaneously  by myself.   Date: 04/24/2012  Rate: 74  Rhythm: normal sinus rhythm  QRS Axis: normal  Intervals: normal  ST/T Wave abnormalities: nonspecific T wave changes  Conduction Disutrbances:none  Narrative Interpretation:   Old EKG Reviewed: unchanged    Labs Reviewed  CBC - Abnormal; Notable for the following:    WBC 12.6 (*)     Hemoglobin 9.6 (*)     HCT 30.3 (*)     MCV 71.5 (*)     MCH 22.6 (*)     RDW 17.5 (*)     All other components within normal limits  DIFFERENTIAL - Abnormal; Notable for the following:    Neutrophils Relative 79 (*)     Neutro Abs 10.0 (*)  All other components within normal limits  COMPREHENSIVE METABOLIC PANEL - Abnormal; Notable for the following:    Creatinine, Ser 1.20 (*)     GFR calc non Af Amer 44 (*)     GFR calc Af Amer 51 (*)     All other components within normal limits  LIPID PANEL - Abnormal; Notable for the following:    Cholesterol 202 (*)     Triglycerides 194 (*)     LDL Cholesterol 104 (*)     All other components within normal limits  PROTIME-INR  APTT  CK TOTAL AND CKMB  TROPONIN I  URINE RAPID DRUG SCREEN (HOSP PERFORMED)  URINALYSIS, ROUTINE W REFLEX MICROSCOPIC  GLUCOSE, CAPILLARY  LACTIC ACID, PLASMA  GLUCOSE, CAPILLARY  HEMOGLOBIN A1C   Dg Chest 2 View  04/24/2012  *RADIOLOGY REPORT*  Clinical Data: Asthma, tobacco use  CHEST - 2 VIEW  Comparison: 04/15/2012  Findings: Mild cardiomegaly.  Prominent perihilar interstitial markings as before.  No focal infiltrate or overt edema.  No effusion.  Regional bones unremarkable.  Tortuous thoracic aorta.  IMPRESSION:  1.  Mild cardiomegaly   Original Report Authenticated By: Osa Craver, M.D.    Ct Head Wo Contrast  04/24/2012  *RADIOLOGY REPORT*  Clinical Data: Left side weakness.  CT HEAD WITHOUT CONTRAST  Technique:  Contiguous axial images were obtained from the base of the skull through the vertex without contrast.  Comparison: Brain MRI 06/10/2010 and head CT  scan 06/09/2010.  Findings: There is some cortical atrophy and chronic microvascular ischemic change.  No evidence of acute abnormality including infarct, hemorrhage, mass lesion, mass effect, midline shift or abnormal extra-axial fluid collection.  Mucosal thickening is seen in the sphenoid sinus and there is a short air fluid level in the left maxillary sinus.  IMPRESSION:  1.  No acute intracranial abnormality. 2.  Mucosal thickening sphenoid sinus and short air fluid level left maxillary sinus.   Original Report Authenticated By: Bernadene Bell. D'ALESSIO, M.D.      1. TIA    MDM   patient was evaluated by myself. Based on evaluation patient was worked up for possible stroke. Given her last time seen normal this was not a code stroke. Patient had normal CBG. Head CT did not show any signs of acute stroke though some evidence of maxillary sinusitis is seen. Patient is still taking Avelox for pneumonia per her primary care provider. She did have a chest x-ray which showed no infiltrates today. White count was slightly elevated at 12.6. Urinalysis was completely unremarkable. EKG and troponins were and telling and patient had no chest pain. She did have decreased breath sounds in history of asthma. One breathing treatment was provided based on exam findings and patient had improved aeration following this. Her leukocytosis could be attributed to her recent course of steroids. Patient did not have an elevated lactate. Her vital signs remained stable here. MRI was unavailable at this location. Patient no longer had her symptoms that have prompted her initial arrival. Patient did have an ABCD square score of 5. This was based on age, combination of slurred speech and weakness, and duration of symptoms greater than 10 minutes and less than 1 hour. Patient was a somewhat poor historian but daughter was at bedside and able to corroborate history. Patient was seen to ambulate by myself independently. She'll be  transferred to [facility] for TIA protocol.        Cyndra Numbers, MD 04/24/12 [phone]

## 2012-04-25 ENCOUNTER — Observation Stay (HOSPITAL_COMMUNITY): Payer: Medicare Other

## 2012-04-25 DIAGNOSIS — J189 Pneumonia, unspecified organism: Secondary | ICD-10-CM

## 2012-04-25 DIAGNOSIS — G459 Transient cerebral ischemic attack, unspecified: Principal | ICD-10-CM

## 2012-04-25 DIAGNOSIS — G8190 Hemiplegia, unspecified affecting unspecified side: Secondary | ICD-10-CM

## 2012-04-25 LAB — HEMOGLOBIN A1C
Hgb A1c MFr Bld: 6.3 % — ABNORMAL HIGH (ref <5.7)
Mean Plasma Glucose: 134 mg/dL — ABNORMAL HIGH (ref <117)

## 2012-04-25 NOTE — ED Provider Notes (Signed)
Assumed patient care in the CDU from Dr. Dierdre Highman. 71 year old female presents with slurring of speech and L lower extremity weakness.  Pt in CDU while awaiting MRI result to r/o stroke.  Pt appears to be resting comfortably in room, but does complain of lower back pain which is chronic, pain mediation given.  Pt is A&Ox3, normal speech.  Neuro exam is significant for 4/5 strength to L upper and lower extremities.  Pt does exhibits difficulty with finger to nose especially on the L side.  Has difficulty with L leg movement but able to perform normal heel to shin testing. Heart S1S2, RRR, lung CTAB, abd soft and nontender.  Romberg negative, antalgic gait favoring R side.  Patella DTR 2+ bilat.    10:38 AM MRI of the brain shows no evidence of acute stroke.  2D cardiac echo is without acute finding, her HgB A1C is 6.3.  Her total cholesterol 202 with triglyceride 194, LDL 104.  Pt is currently on cholesterol medicine and takes baby ASA.  On reevaluation pt has norma speech, normal 5/5 upper strength, 4/5 lower strength.  Since she has hx of chronic back pain, i recommend pt to f/u with her PCP which she is scheduled to f/u tomorrow.  Pt is stable to be discharge at this time.  Strict return precaution discussed.  Pt and family member are aware.    Results for orders placed during the hospital encounter of 04/24/12  PROTIME-INR      Component Value Range   Prothrombin Time 13.6  11.6 - 15.2 seconds   INR 1.02  0.00 - 1.49  APTT      Component Value Range   aPTT 26  24 - 37 seconds  CBC      Component Value Range   WBC 12.6 (*) 4.0 - 10.5 K/uL   RBC 4.24  3.87 - 5.11 MIL/uL   Hemoglobin 9.6 (*) 12.0 - 15.0 g/dL   HCT 40.3 (*) 47.4 - 25.9 %   MCV 71.5 (*) 78.0 - 100.0 fL   MCH 22.6 (*) 26.0 - 34.0 pg   MCHC 31.7  30.0 - 36.0 g/dL   RDW 56.3 (*) 87.5 - 64.3 %   Platelets 331  150 - 400 K/uL  DIFFERENTIAL      Component Value Range   Neutrophils Relative 79 (*) 43 - 77 %   Neutro Abs 10.0 (*) 1.7 -  7.7 K/uL   Lymphocytes Relative 13  12 - 46 %   Lymphs Abs 1.6  0.7 - 4.0 K/uL   Monocytes Relative 8  3 - 12 %   Monocytes Absolute 1.0  0.1 - 1.0 K/uL   Eosinophils Relative 0  0 - 5 %   Eosinophils Absolute 0.0  0.0 - 0.7 K/uL   Basophils Relative 0  0 - 1 %   Basophils Absolute 0.0  0.0 - 0.1 K/uL  COMPREHENSIVE METABOLIC PANEL      Component Value Range   Sodium 138  135 - 145 mEq/L   Potassium 3.9  3.5 - 5.1 mEq/L   Chloride 102  96 - 112 mEq/L   CO2 25  19 - 32 mEq/L   Glucose, Bld 95  70 - 99 mg/dL   BUN 21  6 - 23 mg/dL   Creatinine, Ser 3.29 (*) 0.50 - 1.10 mg/dL   Calcium 8.8  8.4 - 51.8 mg/dL   Total Protein 6.7  6.0 - 8.3 g/dL   Albumin 3.5  3.5 -  5.2 g/dL   AST 21  0 - 37 U/L   ALT 23  0 - 35 U/L   Alkaline Phosphatase 65  39 - 117 U/L   Total Bilirubin 0.3  0.3 - 1.2 mg/dL   GFR calc non Af Amer 44 (*) >90 mL/min   GFR calc Af Amer 51 (*) >90 mL/min  CK TOTAL AND CKMB      Component Value Range   Total CK 48  7 - 177 U/L   CK, MB 1.6  0.3 - 4.0 ng/mL   Relative Index RELATIVE INDEX IS INVALID  0.0 - 2.5  TROPONIN I      Component Value Range   Troponin I <0.30  <0.30 ng/mL  URINE RAPID DRUG SCREEN (HOSP PERFORMED)      Component Value Range   Opiates NONE DETECTED  NONE DETECTED   Cocaine NONE DETECTED  NONE DETECTED   Benzodiazepines NONE DETECTED  NONE DETECTED   Amphetamines NONE DETECTED  NONE DETECTED   Tetrahydrocannabinol NONE DETECTED  NONE DETECTED   Barbiturates NONE DETECTED  NONE DETECTED  URINALYSIS, ROUTINE W REFLEX MICROSCOPIC      Component Value Range   Color, Urine YELLOW  YELLOW   APPearance CLEAR  CLEAR   Specific Gravity, Urine 1.020  1.005 - 1.030   pH 8.0  5.0 - 8.0   Glucose, UA NEGATIVE  NEGATIVE mg/dL   Hgb urine dipstick NEGATIVE  NEGATIVE   Bilirubin Urine NEGATIVE  NEGATIVE   Ketones, ur NEGATIVE  NEGATIVE mg/dL   Protein, ur NEGATIVE  NEGATIVE mg/dL   Urobilinogen, UA 0.2  0.0 - 1.0 mg/dL   Nitrite NEGATIVE  NEGATIVE     Leukocytes, UA NEGATIVE  NEGATIVE  GLUCOSE, CAPILLARY      Component Value Range   Glucose-Capillary 97  70 - 99 mg/dL  LACTIC ACID, PLASMA      Component Value Range   Lactic Acid, Venous 1.4  0.5 - 2.2 mmol/L  GLUCOSE, CAPILLARY      Component Value Range   Glucose-Capillary 95  70 - 99 mg/dL  LIPID PANEL      Component Value Range   Cholesterol 202 (*) 0 - 200 mg/dL   Triglycerides 161 (*) <150 mg/dL   HDL 59  >09 mg/dL   Total CHOL/HDL Ratio 3.4     VLDL 39  0 - 40 mg/dL   LDL Cholesterol 604 (*) 0 - 99 mg/dL  HEMOGLOBIN V4U      Component Value Range   Hemoglobin A1C 6.3 (*) <5.7 %   Mean Plasma Glucose 134 (*) <117 mg/dL   Dg Chest 2 View  9/81/1914  *RADIOLOGY REPORT*  Clinical Data: Asthma, tobacco use  CHEST - 2 VIEW  Comparison: 04/15/2012  Findings: Mild cardiomegaly.  Prominent perihilar interstitial markings as before.  No focal infiltrate or overt edema.  No effusion.  Regional bones unremarkable.  Tortuous thoracic aorta.  IMPRESSION:  1.  Mild cardiomegaly   Original Report Authenticated By: Osa Craver, M.D.    Dg Chest 2 View  04/15/2012  *RADIOLOGY REPORT*  Clinical Data: Cough, congestion and shortness of breath.  CHEST - 2 VIEW  Comparison: CT chest and chest radiograph 01/21/2012.  Findings: Trachea is midline.  Heart size normal. Patchy airspace opacity is seen at the right lung base.  Interstitial markings may be minimally prominent.  No pleural fluid.  IMPRESSION: Right basilar airspace opacity, new from 01/21/2012, and likely representative of pneumonia.  Of note, no potentially obstructing central lesions on cross-sectional examination of 01/21/2012.   Original Report Authenticated By: Reyes Ivan, M.D.    Dg Thoracic Spine 2 View  04/15/2012  *RADIOLOGY REPORT*  Clinical Data: Mid back pain.  THORACIC SPINE - 2 VIEW  Comparison: Chest radiograph 04/15/2012.  Findings: Mild dextroconvex curvature of the thoracic spine. Alignment is  otherwise anatomic.  Vertebral body height is maintained.  Minimal endplate degenerative changes. Cervicothoracic junction is in alignment.  Visualized portion of the chest is unremarkable.  IMPRESSION: No acute findings.  Minimal spondylosis.   Original Report Authenticated By: Reyes Ivan, M.D.    Ct Head Wo Contrast  04/24/2012  *RADIOLOGY REPORT*  Clinical Data: Left side weakness.  CT HEAD WITHOUT CONTRAST  Technique:  Contiguous axial images were obtained from the base of the skull through the vertex without contrast.  Comparison: Brain MRI 06/10/2010 and head CT scan 06/09/2010.  Findings: There is some cortical atrophy and chronic microvascular ischemic change.  No evidence of acute abnormality including infarct, hemorrhage, mass lesion, mass effect, midline shift or abnormal extra-axial fluid collection.  Mucosal thickening is seen in the sphenoid sinus and there is a short air fluid level in the left maxillary sinus.  IMPRESSION:  1.  No acute intracranial abnormality. 2.  Mucosal thickening sphenoid sinus and short air fluid level left maxillary sinus.   Original Report Authenticated By: Bernadene Bell. Maricela Curet, M.D.    Mr Brain Wo Contrast  04/25/2012  *RADIOLOGY REPORT*  Clinical Data: Left leg weakness  MRI HEAD WITHOUT CONTRAST  Technique:  Multiplanar, multiecho pulse sequences of the brain and surrounding structures were obtained according to standard protocol without intravenous contrast.  Comparison: CT head 04/24/2012  Findings: Mild prominence of the ventricles, most consistent with atrophy.  Negative for acute infarct.  Minimal chronic changes in the white matter and pons most likely due to chronic ischemia.  No mass or edema.  No midline shift.  Negative for intracranial hemorrhage.  Air-fluid level left maxillary sinus.  Vessels at the base of the brain are patent.  The patient was not able to hold still.  Image quality degraded by moderate motion.  IMPRESSION: No acute intracranial  abnormality.   Original Report Authenticated By: Camelia Phenes, M.D.         Fayrene Helper, PA-C 04/25/12 1055

## 2012-04-25 NOTE — Progress Notes (Signed)
Initial review for observation status is complete. 

## 2012-04-25 NOTE — Consult Note (Signed)
Referring Physician: Alto Denver    Chief Complaint: Left sided weakness, dysarthria  HPI: Monica Copeland is an 71 y.o. female who was at baseline until Monday.  Awakened feeling weak in the left leg.  Saw her physician who felt that it was her back causing the problem.  Today awakened feeling weaker in the left leg and was noted to have dysarthria as well.  Presented to [facility].  Was transferred here for the TIA protocol since by presentation there her symptoms had almost completely resolved.  En route was felt to worsen with weakness again noted in the LLE.  I was called at arrival but no extensive weakness was noted.  Son is with the patient and feels that her speech is at baseline as well.   Patient has a history of back problems.  Pain is particularly a problem on the left.  Also has some LLE weakness at baseline.  LSN: 04/21/2012 tPA Given: No: Outside time window  Past Medical History  Diagnosis Date  . Asthma   . Back pain   . Seizures   . Anxiety   . Cushing disease     Past Surgical History  Procedure Date  . Brain surgery 1987  . Colon surgery   . Abdominal hysterectomy   . Appendectomy     Family History  Problem Relation Age of Onset  . Hypertension Mother   . Hypertension Brother   . Heart attack Mother   . Diabetes Father    Social History:  reports that she quit smoking about 3 months ago. Her smoking use included Cigarettes. She smoked 1 pack per day. She has never used smokeless tobacco. She reports that she does not drink alcohol or use illicit drugs.  Allergies:  Allergies  Allergen Reactions  . Sleep Aid (Diphenhydramine) Other (See Comments)    Pt experiences reverse reaction: lots of energy instead of sedative effect  . Tomato Rash    Medications: I have reviewed the patient's current medications. Prior to Admission:  Current outpatient prescriptions:ALPRAZolam (XANAX) 0.5 MG tablet, Take 1 tablet (0.5 mg total) by mouth at bedtime as needed for sleep.,  Disp: 30 tablet, Rfl: 0;  budesonide-formoterol (SYMBICORT) 160-4.5 MCG/ACT inhaler, Inhale 2 puffs into the lungs 2 (two) times daily., Disp: 1 Inhaler, Rfl: 11 cortisone (CORTONE) 25 MG tablet, Take 1.5 tablets (37.5 mg total) by mouth daily. Take 1 tablet in the morning and 0.5 tablet in the afternoon, Disp: 45 tablet, Rfl: 5;  ezetimibe-simvastatin (VYTORIN) 10-40 MG per tablet, Take 1 tablet by mouth at bedtime., Disp: 30 tablet, Rfl: 5 levalbuterol (XOPENEX) 0.63 MG/3ML nebulizer solution, Take 3 mLs (0.63 mg total) by nebulization every 6 (six) hours as needed for wheezing or shortness of breath., Disp: 3 mL, Rfl: 2;  moxifloxacin (AVELOX) 400 MG tablet, Take 1 tablet (400 mg total) by mouth daily., Disp: 10 tablet, Rfl: 0;  oxyCODONE-acetaminophen (ROXICET) 5-325 MG per tablet, Take 1 tablet by mouth every 8 (eight) hours as needed for pain., Disp: 60 tablet, Rfl: 0 pantoprazole (PROTONIX) 40 MG tablet, Take 1 tablet (40 mg total) by mouth 2 (two) times daily., Disp: 60 tablet, Rfl: 11;  PARoxetine (PAXIL) 30 MG tablet, Take 1 tablet (30 mg total) by mouth every morning., Disp: 30 tablet, Rfl: 11;  pramipexole (MIRAPEX) 1 MG tablet, Take 1 tablet (1 mg total) by mouth at bedtime., Disp: 30 tablet, Rfl: 11 predniSONE (DELTASONE) 10 MG tablet, 3 po qd for 3 days then 2 po qd for  3 days the 1 po qd for 3 days, Disp: 18 tablet, Rfl: 0;  zonisamide (ZONEGRAN) 100 MG capsule, Take 3 capsules (300 mg total) by mouth daily. Take 1 capsule in the morning and 2 capsules in the evening, Disp: 90 capsule, Rfl: 2  ROS: History obtained from the patient  General ROS: negative for - chills, fatigue, fever, night sweats, weight gain or weight loss Psychological ROS: negative for - behavioral disorder, hallucinations, memory difficulties, mood swings or suicidal ideation Ophthalmic ROS: negative for - blurry vision, double vision, eye pain or loss of vision ENT ROS: negative for - epistaxis, nasal discharge, oral  lesions, sore throat, tinnitus or vertigo Allergy and Immunology ROS: negative for - hives or itchy/watery eyes Hematological and Lymphatic ROS: negative for - bleeding problems, bruising or swollen lymph nodes Endocrine ROS: negative for - galactorrhea, hair pattern changes, polydipsia/polyuria or temperature intolerance Respiratory ROS: negative for - cough, hemoptysis, shortness of breath or wheezing Cardiovascular ROS: negative for - chest pain, dyspnea on exertion, edema or irregular heartbeat Gastrointestinal ROS: negative for - abdominal pain, diarrhea, hematemesis, nausea/vomiting or stool incontinence Genito-Urinary ROS: negative for - dysuria, hematuria, incontinence or urinary frequency/urgency Musculoskeletal ROS: as noted in HPI Neurological ROS: as noted in HPI Dermatological ROS: negative for rash and skin lesion changes  Physical Examination: Blood pressure 104/50, pulse 68, temperature 97.9 F (36.6 C), temperature source Oral, resp. rate 15, height 5' (1.524 m), weight 63.504 kg (140 lb), SpO2 97.00%.  Neurologic Examination: Mental Status: Alert.  At times confused.  Speech fluent without evidence of aphasia.  Able to follow 3 step commands without difficulty. Cranial Nerves: II: Discs flat bilaterally; Visual fields grossly normal, pupils equal, round, reactive to light and accommodation III,IV, VI: ptosis not present, extra-ocular motions intact bilaterally V,VII: smile symmetric, facial light touch sensation normal bilaterally VIII: hearing normal bilaterally IX,X: gag reflex present XI: bilateral shoulder shrug XII: midline tongue extension Motor: Right : Upper extremity   5/5    Left:     Upper extremity   5/5.  No drift  Lower extremity   5/5     Lower extremity   4/5 Tone and bulk:normal tone throughout; no atrophy noted Sensory: Pinprick and light touch intact throughout, bilaterally Deep Tendon Reflexes: 2+ and symmetric throughout Plantars: Right:  equivocal   Left: equivocal Cerebellar: normal finger-to-nose and normal heel-to-shin test   Laboratory Studies:  Basic Metabolic Panel:  Lab 04/24/12 6295  NA 138  K 3.9  CL 102  CO2 25  GLUCOSE 95  BUN 21  CREATININE 1.20*  CALCIUM 8.8  MG --  PHOS --    Liver Function Tests:  Lab 04/24/12 1830  AST 21  ALT 23  ALKPHOS 65  BILITOT 0.3  PROT 6.7  ALBUMIN 3.5   No results found for this basename: LIPASE:5,AMYLASE:5 in the last 168 hours No results found for this basename: AMMONIA:3 in the last 168 hours  CBC:  Lab 04/24/12 1830  WBC 12.6*  NEUTROABS 10.0*  HGB 9.6*  HCT 30.3*  MCV 71.5*  PLT 331    Cardiac Enzymes:  Lab 04/24/12 1830  CKTOTAL 48  CKMB 1.6  CKMBINDEX --  TROPONINI <0.30    BNP: No components found with this basename: POCBNP:5  CBG:  Lab 04/24/12 1944 04/24/12 1820  GLUCAP 95 97    Microbiology: Results for orders placed during the hospital encounter of 01/21/12  CULTURE, BLOOD (ROUTINE X 2)     Status: Normal  Collection Time   01/21/12  3:50 PM      Component Value Range Status Comment   Specimen Description BLOOD RIGHT ARM   Final    Special Requests     Final    Value: BOTTLES DRAWN AEROBIC AND ANAEROBIC 10CC BLUE 5CC RED   Culture  Setup Time [id number]   Final    Culture NO GROWTH 5 DAYS   Final    Report Status 01/27/2012 FINAL   Final   CULTURE, BLOOD (ROUTINE X 2)     Status: Normal   Collection Time   01/21/12  4:00 PM      Component Value Range Status Comment   Specimen Description BLOOD LEFT HAND   Final    Special Requests     Final    Value: BOTTLES DRAWN AEROBIC AND ANAEROBIC 10CC BLUE 7CC RED   Culture  Setup Time [id number]   Final    Culture NO GROWTH 5 DAYS   Final    Report Status 01/27/2012 FINAL   Final   URINE CULTURE     Status: Normal   Collection Time   01/21/12  5:19 PM      Component Value Range Status Comment   Specimen Description URINE, CLEAN CATCH   Final    Special Requests NONE    Final    Culture  Setup Time [id number]   Final    Colony Count 10,000 COLONIES/ML   Final    Culture     Final    Value: STAPHYLOCOCCUS SPECIES (COAGULASE NEGATIVE)     Note: RIFAMPIN AND GENTAMICIN SHOULD NOT BE USED AS SINGLE DRUGS FOR TREATMENT OF STAPH INFECTIONS.   Report Status 01/24/2012 FINAL   Final    Organism ID, Bacteria STAPHYLOCOCCUS SPECIES (COAGULASE NEGATIVE)   Final   URINE CULTURE     Status: Normal   Collection Time   01/21/12  9:06 PM      Component Value Range Status Comment   Specimen Description URINE, RANDOM   Final    Special Requests NONE   Final    Culture  Setup Time [id number]   Final    Colony Count NO GROWTH   Final    Culture NO GROWTH   Final    Report Status 01/23/2012 FINAL   Final   CLOSTRIDIUM DIFFICILE BY PCR     Status: Normal   Collection Time   01/23/12 10:47 AM      Component Value Range Status Comment   C difficile by pcr NEGATIVE  NEGATIVE Final     Coagulation Studies:  Basename 04/24/12 1830  LABPROT 13.6  INR 1.02    Urinalysis:  Lab 04/24/12 1936  COLORURINE YELLOW  LABSPEC 1.020  PHURINE 8.0  GLUCOSEU NEGATIVE  HGBUR NEGATIVE  BILIRUBINUR NEGATIVE  KETONESUR NEGATIVE  PROTEINUR NEGATIVE  UROBILINOGEN 0.2  NITRITE NEGATIVE  LEUKOCYTESUR NEGATIVE    Lipid Panel:    Component Value Date/Time   CHOL 202* 04/24/2012 2103   TRIG 194* 04/24/2012 2103   HDL 59 04/24/2012 2103   CHOLHDL 3.4 04/24/2012 2103   VLDL 39 04/24/2012 2103   LDLCALC 104* 04/24/2012 2103    HgbA1C:  No results found for this basename: HGBA1C    Urine Drug Screen:     Component Value Date/Time   LABOPIA NONE DETECTED 04/24/2012 1936   COCAINSCRNUR NONE DETECTED 04/24/2012 1936   LABBENZ NONE DETECTED 04/24/2012 1936   AMPHETMU NONE DETECTED 04/24/2012 1936   THCU NONE DETECTED  04/24/2012 1936   LABBARB NONE DETECTED 04/24/2012 1936    Alcohol Level: No results found for this basename: ETH:2 in the last 168 hours   Imaging: Dg Chest 2  View  04/24/2012  *RADIOLOGY REPORT*  Clinical Data: Asthma, tobacco use  CHEST - 2 VIEW  Comparison: 04/15/2012  Findings: Mild cardiomegaly.  Prominent perihilar interstitial markings as before.  No focal infiltrate or overt edema.  No effusion.  Regional bones unremarkable.  Tortuous thoracic aorta.  IMPRESSION:  1.  Mild cardiomegaly   Original Report Authenticated By: Osa Craver, M.D.    Ct Head Wo Contrast  04/24/2012  *RADIOLOGY REPORT*  Clinical Data: Left side weakness.  CT HEAD WITHOUT CONTRAST  Technique:  Contiguous axial images were obtained from the base of the skull through the vertex without contrast.  Comparison: Brain MRI 06/10/2010 and head CT scan 06/09/2010.  Findings: There is some cortical atrophy and chronic microvascular ischemic change.  No evidence of acute abnormality including infarct, hemorrhage, mass lesion, mass effect, midline shift or abnormal extra-axial fluid collection.  Mucosal thickening is seen in the sphenoid sinus and there is a short air fluid level in the left maxillary sinus.  IMPRESSION:  1.  No acute intracranial abnormality. 2.  Mucosal thickening sphenoid sinus and short air fluid level left maxillary sinus.   Original Report Authenticated By: Bernadene Bell. Maricela Curet, M.D.     Assessment: 72 y.o. female presenting with LLE weakness and dysarthria.  Although patient with a history of back problems this would not explain her dysarthria.  Stroke work up recommended.  Patient without stroke risk factors.    Stroke Risk Factors - none  Plan: 1. HgbA1c, fasting lipid panel 2. MRI, MRA  of the brain without contrast 3. PT consult, OT consult 4. Echocardiogram 5. Carotid dopplers 6. Prophylactic therapy-Antiplatelet med: Aspirin - dose 81mg  daily 7. Risk factor modification 8. Telemetry monitoring 9. Frequent neuro checks   Thana Farr, MD [facility] [phone] 04/25/2012, 12:30 AM

## 2012-04-25 NOTE — ED Notes (Signed)
Patient has gone to [facility] lab

## 2012-04-25 NOTE — ED Notes (Signed)
Reita Cliche [phone] (son) call if needed

## 2012-04-25 NOTE — Progress Notes (Signed)
*  PRELIMINARY RESULTS* Vascular Ultrasound Carotid Duplex (Doppler) has been completed.   No obvious evidence of significant internal carotid artery stenosis bilaterally. Bilateral antegrade vertebral artery flow.  04/25/2012 11:01 AM Gertie Fey, RDMS, RDCS

## 2012-04-25 NOTE — ED Notes (Signed)
Pt coming to cdu from [facility]

## 2012-04-26 ENCOUNTER — Other Ambulatory Visit: Payer: Medicare Other

## 2012-04-26 ENCOUNTER — Ambulatory Visit: Payer: Medicare Other

## 2012-04-26 NOTE — ED Provider Notes (Signed)
Medical screening examination/treatment/procedure(s) were performed by non-physician practitioner and as supervising physician I was immediately available for consultation/collaboration.   Loren Racer, MD 04/26/12 0730

## 2012-04-26 NOTE — ED Provider Notes (Signed)
Medical screening examination/treatment/procedure(s) were conducted as a shared visit with non-physician practitioner(s) and myself.  I personally evaluated the patient during the encounter  Devaunte Gasparini T Lavita Pontius, MD 04/26/12 2345 

## 2012-04-30 ENCOUNTER — Ambulatory Visit: Payer: Medicare Other | Admitting: Family Medicine

## 2012-05-01 ENCOUNTER — Telehealth: Payer: Self-pay | Admitting: Family Medicine

## 2012-05-01 NOTE — Telephone Encounter (Signed)
Monica Copeland wt/[facility] [employer] called wanting status of faxes sent 8.7 & 8.23 regarding PWR WC  Advised Monica Copeland allow 7-10 business days before paperwork can be received. She became belligerent stating she needed to know if dr.lowne was denying the request or just simply taking her time in filling out the paperwork. I then transferred the call to CMA Arman Bogus

## 2012-05-01 NOTE — Telephone Encounter (Signed)
Spoke with Monica Copeland who asked if we have completed the paperwork, I made her aware she would need to follow up with the patient for paperwork but I made her aware the paperwork was not sent, she started yelling, and I advised her not to yell at me, I made her aware that I do not work for her so she should not continue to yell and she would need to follow up with the patient since she was becoming irate, she said she spoke with [place] and was told that the paperwork was in process, I made her aware [place] schedules appointments and has nothing to do with any of the paperwork, she again started to yell and accused [place] of not knowing how to perform our jobs. I again reminded Monica Copeland to call the patient and discuss and she continued to be loud an irate and I ended the call. (No signed HIPAA on file releasing info to [facility]).

## 2012-05-01 NOTE — Telephone Encounter (Signed)
[facility] is providing the power wheelchair

## 2012-05-17 ENCOUNTER — Encounter: Payer: Self-pay | Admitting: Family Medicine

## 2012-05-22 ENCOUNTER — Other Ambulatory Visit: Payer: Self-pay | Admitting: Family Medicine

## 2012-05-22 DIAGNOSIS — M546 Pain in thoracic spine: Secondary | ICD-10-CM

## 2012-05-22 DIAGNOSIS — F419 Anxiety disorder, unspecified: Secondary | ICD-10-CM

## 2012-05-22 MED ORDER — ALPRAZOLAM 0.5 MG PO TABS: 0.5000 mg | ORAL_TABLET | Freq: Every evening | ORAL | Status: DC | PRN

## 2012-05-22 MED ORDER — OXYCODONE-ACETAMINOPHEN 5-325 MG PO TABS: 1.0000 | ORAL_TABLET | Freq: Three times a day (TID) | ORAL | Status: DC | PRN

## 2012-05-22 NOTE — Telephone Encounter (Signed)
Patient aware Rx ready for pick up tomorrow.    KP 

## 2012-05-22 NOTE — Telephone Encounter (Signed)
Alprazolam 0.5 mg and her pain pill that she don't know the name of. Call her when ready and she is coming to let you know which one of her child will be picking it up for her

## 2012-05-22 NOTE — Telephone Encounter (Signed)
Last seen 04/19/12-- xanax filled 04/01/12 # 30 and Oxy filled 04/19/12 # 60. Please advise     KP

## 2012-06-11 ENCOUNTER — Ambulatory Visit: Payer: Medicare Other

## 2012-06-11 ENCOUNTER — Other Ambulatory Visit: Payer: Medicare Other

## 2012-06-19 ENCOUNTER — Telehealth: Payer: Self-pay | Admitting: Family Medicine

## 2012-06-19 DIAGNOSIS — F419 Anxiety disorder, unspecified: Secondary | ICD-10-CM

## 2012-06-19 MED ORDER — ALPRAZOLAM 0.5 MG PO TABS: 0.5000 mg | ORAL_TABLET | Freq: Every evening | ORAL | Status: DC | PRN

## 2012-06-19 NOTE — Telephone Encounter (Signed)
Last seen 04/19/12 and 05/22/12 # 30. Please advise     KP

## 2012-06-19 NOTE — Telephone Encounter (Signed)
Ok for #30 

## 2012-06-19 NOTE — Telephone Encounter (Signed)
Refill: Alprazolam 0.5 mg tablet. Take 1 tablet at bedtime as needed for sleep. Qty 30. Last fill 05-23-12

## 2012-06-19 NOTE — Telephone Encounter (Signed)
Rx sent      KP 

## 2012-06-21 ENCOUNTER — Other Ambulatory Visit: Payer: Self-pay

## 2012-06-21 DIAGNOSIS — M546 Pain in thoracic spine: Secondary | ICD-10-CM

## 2012-06-21 MED ORDER — OXYCODONE-ACETAMINOPHEN 5-325 MG PO TABS: 1.0000 | ORAL_TABLET | Freq: Three times a day (TID) | ORAL | Status: DC | PRN

## 2012-06-21 NOTE — Telephone Encounter (Signed)
OV 03/29/12. Last filled 05/22/12    Plz advise        MW

## 2012-07-01 ENCOUNTER — Other Ambulatory Visit: Payer: Self-pay

## 2012-07-01 NOTE — Telephone Encounter (Signed)
Pt called to check on Rx advised her Oxycodone Rx upfront ready for pick up. Pt stated her son Nancylee Gaines would pick up Rx. Pt asked when appt I advised her 07/05/12 at 3:45pm. Pt stated understanding.      MW

## 2012-07-05 ENCOUNTER — Ambulatory Visit (INDEPENDENT_AMBULATORY_CARE_PROVIDER_SITE_OTHER): Payer: Medicare Other | Admitting: Family Medicine

## 2012-07-05 ENCOUNTER — Encounter: Payer: Self-pay | Admitting: Family Medicine

## 2012-07-05 VITALS — BP 124/64 | HR 117 | Temp 97.7°F | Wt 145.0 lb

## 2012-07-05 DIAGNOSIS — M549 Dorsalgia, unspecified: Secondary | ICD-10-CM

## 2012-07-05 DIAGNOSIS — J45909 Unspecified asthma, uncomplicated: Secondary | ICD-10-CM

## 2012-07-05 DIAGNOSIS — G8929 Other chronic pain: Secondary | ICD-10-CM

## 2012-07-05 DIAGNOSIS — M25559 Pain in unspecified hip: Secondary | ICD-10-CM

## 2012-07-05 DIAGNOSIS — E249 Cushing's syndrome, unspecified: Secondary | ICD-10-CM

## 2012-07-05 DIAGNOSIS — E785 Hyperlipidemia, unspecified: Secondary | ICD-10-CM

## 2012-07-05 MED ORDER — ALPRAZOLAM 0.5 MG PO TABS: 0.5000 mg | ORAL_TABLET | Freq: Every evening | ORAL | Status: DC | PRN

## 2012-07-05 NOTE — Assessment & Plan Note (Signed)
Check labs today.

## 2012-07-05 NOTE — Patient Instructions (Signed)
Asthma Attack Prevention  HOW CAN ASTHMA BE PREVENTED?  Currently, there is no way to prevent asthma from starting. However, you can take steps to control the disease and prevent its symptoms after you have been diagnosed. Learn about your asthma and how to control it. Take an active role to control your asthma by working with your caregiver to create and follow an asthma action plan. An asthma action plan guides you in taking your medicines properly, avoiding factors that make your asthma worse, tracking your level of asthma control, responding to worsening asthma, and seeking emergency care when needed. To track your asthma, keep records of your symptoms, check your peak flow number using a peak flow meter (handheld device that shows how well air moves out of your lungs), and get regular asthma checkups.   Other ways to prevent asthma attacks include:   Use medicines as your caregiver directs.   Identify and avoid things that make your asthma worse (as much as you can).   Keep track of your asthma symptoms and level of control.   Get regular checkups for your asthma.   With your caregiver, write a detailed plan for taking medicines and managing an asthma attack. Then be sure to follow your action plan. Asthma is an ongoing condition that needs regular monitoring and treatment.   Identify and avoid asthma triggers. A number of outdoor allergens and irritants (pollen, mold, cold air, air pollution) can trigger asthma attacks. Find out what causes or makes your asthma worse, and take steps to avoid those triggers (see below).   Monitor your breathing. Learn to recognize warning signs of an attack, such as slight coughing, wheezing or shortness of breath. However, your lung function may already decrease before you notice any signs or symptoms, so regularly measure and record your peak airflow with a home peak flow meter.   Identify and treat attacks early. If you act quickly, you're less likely to have a  severe attack. You will also need less medicine to control your symptoms. When your peak flow measurements decrease and alert you to an upcoming attack, take your medicine as instructed, and immediately stop any activity that may have triggered the attack. If your symptoms do not improve, get medical help.   Pay attention to increasing quick-relief inhaler use. If you find yourself relying on your quick-relief inhaler (such as albuterol), your asthma is not under control. See your caregiver about adjusting your treatment.  IDENTIFY AND CONTROL FACTORS THAT MAKE YOUR ASTHMA WORSE  A number of common things can set off or make your asthma symptoms worse (asthma triggers). Keep track of your asthma symptoms for several weeks, detailing all the environmental and emotional factors that are linked with your asthma. When you have an asthma attack, go back to your asthma diary to see which factor, or combination of factors, might have contributed to it. Once you know what these factors are, you can take steps to control many of them.   Allergies: If you have allergies and asthma, it is important to take asthma prevention steps at home. Asthma attacks (worsening of asthma symptoms) can be triggered by allergies, which can cause temporary increased inflammation of your airways. Minimizing contact with the substance to which you are allergic will help prevent an asthma attack.  Animal Dander:    Some people are allergic to the flakes of skin or dried saliva from animals with fur or feathers. Keep these pets out of your home.   If   you can't keep a pet outdoors, keep the pet out of your bedroom and other sleeping areas at all times, and keep the door closed.   Remove carpets and furniture covered with cloth from your home. If that is not possible, keep the pet away from fabric-covered furniture and carpets.  Dust Mites:   Many people with asthma are allergic to dust mites. Dust mites are tiny bugs that are found in every  home, in mattresses, pillows, carpets, fabric-covered furniture, bedcovers, clothes, stuffed toys, fabric, and other fabric-covered items.   Cover your mattress in a special dust-proof cover.   Cover your pillow in a special dust-proof cover, or wash the pillow each week in hot water. Water must be hotter than 130 F to kill dust mites. Cold or warm water used with detergent and bleach can also be effective.   Wash the sheets and blankets on your bed each week in hot water.   Try not to sleep or lie on cloth-covered cushions.   Call ahead when traveling and ask for a smoke-free hotel room. Bring your own bedding and pillows, in case the hotel only supplies feather pillows and down comforters, which may contain dust mites and cause asthma symptoms.   Remove carpets from your bedroom and those laid on concrete, if you can.   Keep stuffed toys out of the bed, or wash the toys weekly in hot water or cooler water with detergent and bleach.  Cockroaches:   Many people with asthma are allergic to the droppings and remains of cockroaches.   Keep food and garbage in closed containers. Never leave food out.   Use poison baits, traps, powders, gels, or paste (for example, boric acid).   If a spray is used to kill cockroaches, stay out of the room until the odor goes away.  Indoor Mold:   Fix leaky faucets, pipes, or other sources of water that have mold around them.   Clean moldy surfaces with a cleaner that has bleach in it.  Pollen and Outdoor Mold:   When pollen or mold spore counts are high, try to keep your windows closed.   Stay indoors with windows closed from late morning to afternoon, if you can. Pollen and some mold spore counts are highest at that time.   Ask your caregiver whether you need to take or increase anti-inflammatory medicine before your allergy season starts.  Irritants:    Tobacco smoke is an irritant. If you smoke, ask your caregiver how you can quit. Ask family members to quit  smoking, too. Do not allow smoking in your home or car.   If possible, do not use a wood-burning stove, kerosene heater, or fireplace. Minimize exposure to all sources of smoke, including incense, candles, fires, and fireworks.   Try to stay away from strong odors and sprays, such as perfume, talcum powder, hair spray, and paints.   Decrease humidity in your home and use an indoor air cleaning device. Reduce indoor humidity to below 60 percent. Dehumidifiers or central air conditioners can do this.   Try to have someone else vacuum for you once or twice a week, if you can. Stay out of rooms while they are being vacuumed and for a short while afterward.   If you vacuum, use a dust mask from a hardware store, a double-layered or microfilter vacuum cleaner bag, or a vacuum cleaner with a HEPA filter.   Sulfites in foods and beverages can be irritants. Do not drink beer or   wine, or eat dried fruit, processed potatoes, or shrimp if they cause asthma symptoms.   Cold air can trigger an asthma attack. Cover your nose and mouth with a scarf on cold or windy days.   Several health conditions can make asthma more difficult to manage, including runny nose, sinus infections, reflux disease, psychological stress, and sleep apnea. Your caregiver will treat these conditions, as well.   Avoid close contact with people who have a cold or the flu, since your asthma symptoms may get worse if you catch the infection from them. Wash your hands thoroughly after touching items that may have been handled by people with a respiratory infection.   Get a flu shot every year to protect against the flu virus, which often makes asthma worse for days or weeks. Also get a pneumonia shot once every five to 10 years.  Drugs:   Aspirin and other painkillers can cause asthma attacks. 10% to 20% of people with asthma have sensitivity to aspirin or a group of painkillers called non-steroidal anti-inflammatory drugs (NSAIDS), such as ibuprofen  and naproxen. These drugs are used to treat pain and reduce fevers. Asthma attacks caused by any of these medicines can be severe and even fatal. These drugs must be avoided in people who have known aspirin sensitive asthma. Products with acetaminophen are considered safe for people who have asthma. It is important that people with aspirin sensitivity read labels of all over-the-counter drugs used to treat pain, colds, coughs, and fever.   Beta blockers and ACE inhibitors are other drugs which you should discuss with your caregiver, in relation to your asthma.  ALLERGY SKIN TESTING   Ask your asthma caregiver about allergy skin testing or blood testing (RAST test) to identify the allergens to which you are sensitive. If you are found to have allergies, allergy shots (immunotherapy) for asthma may help prevent future allergies and asthma. With allergy shots, small doses of allergens (substances to which you are allergic) are injected under your skin on a regular schedule. Over a period of time, your body may become used to the allergen and less responsive with asthma symptoms. You can also take measures to minimize your exposure to those allergens.  EXERCISE   If you have exercise-induced asthma, or are planning vigorous exercise, or exercise in cold, humid, or dry environments, prevent exercise-induced asthma by following your caregiver's advice regarding asthma treatment before exercising.  Document Released: 08/02/2009 Document Revised: 11/06/2011 Document Reviewed: 08/02/2009  Exit[employer] Patient Information 2013 [employer].

## 2012-07-05 NOTE — Progress Notes (Signed)
  Subjective:    Patient ID: Monica Copeland, female    DOB: 05-Oct-1940, 71 y.o.   MRN: [id number]  HPI Pt here for f/u of cholesterol and back pain.       Review of Systems As above    Objective:   Physical Exam  Constitutional: She is oriented to person, place, and time. She appears well-developed and well-nourished.  Cardiovascular: Normal rate and regular rhythm.   No murmur heard. Pulmonary/Chest: Effort normal. She has wheezes.  Musculoskeletal: She exhibits tenderness. She exhibits no edema.       + weakness L Low ext dtr = and b/l   Neurological: She is alert and oriented to person, place, and time.  Psychiatric: She has a normal mood and affect. Her behavior is normal. Judgment and thought content normal.          Assessment & Plan:

## 2012-07-05 NOTE — Assessment & Plan Note (Signed)
Pain now in L hip too-- check xray Pt does not want pain meds

## 2012-07-05 NOTE — Assessment & Plan Note (Signed)
Pt to see endocrine

## 2012-07-06 LAB — BASIC METABOLIC PANEL
CO2: 26 mEq/L (ref 19–32)
Calcium: 9.2 mg/dL (ref 8.4–10.5)
Creat: 1.07 mg/dL (ref 0.50–1.10)

## 2012-07-06 LAB — HEPATIC FUNCTION PANEL
AST: 21 U/L (ref 0–37)
Albumin: 4 g/dL (ref 3.5–5.2)
Alkaline Phosphatase: 84 U/L (ref 39–117)
Indirect Bilirubin: 0.2 mg/dL (ref 0.0–0.9)
Total Bilirubin: 0.3 mg/dL (ref 0.3–1.2)

## 2012-07-06 LAB — LIPID PANEL: HDL: 63 mg/dL (ref >39)

## 2012-07-10 ENCOUNTER — Ambulatory Visit: Payer: Medicare Other

## 2012-07-10 ENCOUNTER — Encounter: Payer: Self-pay | Admitting: Family Medicine

## 2012-07-10 ENCOUNTER — Telehealth: Payer: Self-pay | Admitting: Internal Medicine

## 2012-07-10 ENCOUNTER — Encounter: Payer: Self-pay | Admitting: Internal Medicine

## 2012-07-10 ENCOUNTER — Ambulatory Visit (INDEPENDENT_AMBULATORY_CARE_PROVIDER_SITE_OTHER): Payer: Medicare Other | Admitting: Family Medicine

## 2012-07-10 ENCOUNTER — Telehealth: Payer: Self-pay | Admitting: Family Medicine

## 2012-07-10 VITALS — BP 134/84 | HR 87 | Temp 98.2°F | Wt 148.4 lb

## 2012-07-10 DIAGNOSIS — K625 Hemorrhage of anus and rectum: Secondary | ICD-10-CM

## 2012-07-10 DIAGNOSIS — K219 Gastro-esophageal reflux disease without esophagitis: Secondary | ICD-10-CM

## 2012-07-10 LAB — CBC WITH DIFFERENTIAL/PLATELET
Basophils Absolute: 0 10*3/uL (ref 0.0–0.1)
Eosinophils Absolute: 0.1 10*3/uL (ref 0.0–0.7)
Lymphocytes Relative: 16.2 % (ref 12.0–46.0)
MCHC: 31 g/dL (ref 30.0–36.0)
Monocytes Absolute: 0.5 10*3/uL (ref 0.1–1.0)
Neutrophils Relative %: 77.2 % — ABNORMAL HIGH (ref 43.0–77.0)
Platelets: 277 10*3/uL (ref 150.0–400.0)
RBC: 4.68 Mil/uL (ref 3.87–5.11)
RDW: 20.3 % — ABNORMAL HIGH (ref 11.5–14.6)

## 2012-07-10 LAB — HEPATIC FUNCTION PANEL
Alkaline Phosphatase: 77 U/L (ref 39–117)
Bilirubin, Direct: 0 mg/dL (ref 0.0–0.3)
Total Bilirubin: 0.2 mg/dL — ABNORMAL LOW (ref 0.3–1.2)

## 2012-07-10 LAB — BASIC METABOLIC PANEL
BUN: 18 mg/dL (ref 6–23)
CO2: 26 mEq/L (ref 19–32)
Calcium: 8.9 mg/dL (ref 8.4–10.5)
Creatinine, Ser: 1.1 mg/dL (ref 0.4–1.2)
Glucose, Bld: 114 mg/dL — ABNORMAL HIGH (ref 70–99)

## 2012-07-10 MED ORDER — SUCRALFATE 1 G PO TABS: 1.0000 g | ORAL_TABLET | Freq: Four times a day (QID) | ORAL | Status: DC

## 2012-07-10 NOTE — Patient Instructions (Signed)
Rectal Bleeding Rectal bleeding is when blood passes out of the anus. It is usually a sign that something is wrong. It may not be serious, but it should always be evaluated. Rectal bleeding may present as bright red blood or extremely dark stools. The color may range from dark red or maroon to black (like tar). It is important that the cause of rectal bleeding be identified so treatment can be started and the problem corrected. CAUSES   Hemorrhoids. These are enlarged (dilated) blood vessels or veins in the anal or rectal area.  Fistulas. Theseare abnormal, burrowing channels that usually run from inside the rectum to the skin around the anus. They can bleed.  Anal fissures. This is a tear in the tissue of the anus. Bleeding occurs with bowel movements.  Diverticulosis. This is a condition in which pockets or sacs project from the bowel wall. Occasionally, the sacs can bleed.  Diverticulitis. Thisis an infection involving diverticulosis of the colon.  Proctitis and colitis. These are conditions in which the rectum, colon, or both, can become inflamed and pitted (ulcerated).  Polyps and cancer. Polyps are non-cancerous (benign) growths in the colon that may bleed. Certain types of polyps turn into cancer.  Protrusion of the rectum. Part of the rectum can project from the anus and bleed.  Certain medicines.  Intestinal infections.  Blood vessel abnormalities. HOME CARE INSTRUCTIONS  Eat a high-fiber diet to keep your stool soft.  Limit activity.  Drink enough fluids to keep your urine clear or pale yellow.  Warm baths may be useful to soothe rectal pain.  Follow up with your caregiver as directed. SEEK IMMEDIATE MEDICAL CARE IF:  You develop increased bleeding.  You have black or dark red stools.  You vomit blood or material that looks like coffee grounds.  You have abdominal pain or tenderness.  You have a fever.  You feel weak, nauseous, or you faint.  You have  severe rectal pain or you are unable to have a bowel movement. MAKE SURE YOU:  Understand these instructions.  Will watch your condition.  Will get help right away if you are not doing well or get worse. Document Released: 02/03/2002 Document Revised: 11/06/2011 Document Reviewed: 01/29/2011 Exit[employer] Patient Information 2013 [employer].  

## 2012-07-10 NOTE — Telephone Encounter (Signed)
noted 

## 2012-07-10 NOTE — Telephone Encounter (Signed)
Pt having rectal bleeding and epigastric pain. Dr. Laury Axon requesting pt be seen sooner than scheduled appt. Pt scheduled to see Willette Cluster NP tomorrow at 2pm. Luster Landsberg to notify pt of appt date and time.

## 2012-07-10 NOTE — Progress Notes (Signed)
Subjective:    Monica Copeland is a 71 y.o. female here for evaluation of blood in stool. Patient has associated symptoms of change in stool color: black and loose stools. The patient denies alternating loose stools and constipation, constipation, diarrhea and visible blood: none. The patient has a known history of: gastric ulcer. The patient has had 1 episodes of rectal bleeding. There is not a history of rectal injury. Patient has had similar episodes of rectal bleeding in the past.  The following portions of the patient's history were reviewed and updated as appropriate:  She  has a past medical history of Asthma; Back pain; Seizures; Anxiety; Cushing disease; and GERD (gastroesophageal reflux disease) (01/24/2012). She  does not have any pertinent problems on file. She  has past surgical history that includes Brain surgery (1987); Colon surgery; Abdominal hysterectomy; and Appendectomy. Her family history includes Diabetes in her father; Heart attack in her mother; and Hypertension in her brother and mother. She  reports that she quit smoking about 6 months ago. Her smoking use included Cigarettes. She smoked 1 pack per day. She has never used smokeless tobacco. She reports that she does not drink alcohol or use illicit drugs. She has a current medication list which includes the following prescription(s): alprazolam, cortisone, ezetimibe-simvastatin, levalbuterol, pantoprazole, paroxetine, pramipexole, zonisamide, and sucralfate. Current Outpatient Prescriptions on File Prior to Visit  Medication Sig Dispense Refill  . ALPRAZolam (XANAX) 0.5 MG tablet Take 1 tablet (0.5 mg total) by mouth at bedtime as needed for sleep.  60 tablet  0  . cortisone (CORTONE) 25 MG tablet Take 1.5 tablets (37.5 mg total) by mouth daily. Take 1 tablet in the morning and 0.5 tablet in the afternoon  45 tablet  5  . ezetimibe-simvastatin (VYTORIN) 10-40 MG per tablet Take 1 tablet by mouth at bedtime.  30 tablet  5   . levalbuterol (XOPENEX) 0.63 MG/3ML nebulizer solution Take 3 mLs (0.63 mg total) by nebulization every 6 (six) hours as needed for wheezing or shortness of breath.  3 mL  2  . pantoprazole (PROTONIX) 40 MG tablet Take 1 tablet (40 mg total) by mouth 2 (two) times daily.  60 tablet  11  . PARoxetine (PAXIL) 30 MG tablet Take 1 tablet (30 mg total) by mouth every morning.  30 tablet  11  . pramipexole (MIRAPEX) 1 MG tablet Take 1 tablet (1 mg total) by mouth at bedtime.  30 tablet  11  . zonisamide (ZONEGRAN) 100 MG capsule Take 3 capsules (300 mg total) by mouth daily. Take 1 capsule in the morning and 2 capsules in the evening  90 capsule  2  . sucralfate (CARAFATE) 1 G tablet Take 1 tablet (1 g total) by mouth 4 (four) times daily.  120 tablet  2   She is allergic to sleep aid and tomato..  Review of Systems Pertinent items are noted in HPI.   Objective:    BP 134/84  Pulse 87  Temp 98.2 F (36.8 C) (Oral)  Wt 148 lb 6.4 oz (67.314 kg)  SpO2 97% General appearance: alert, cooperative, appears stated age and no distress Lungs: clear to auscultation bilaterally Heart: S1, S2 normal Abdomen: abnormal findings:  mild tenderness in the epigastrium rectal-- heme positive black stool    Assessment:    Rectal bleeding, likely secondary to gastric ulcer.    Plan:    1. Begin medication: carafate. 2. Blood work per orders. 3. con't protonix and refer to GI 4. . Follow  up as needed.

## 2012-07-10 NOTE — Telephone Encounter (Signed)
To MD for review     KP 

## 2012-07-10 NOTE — Telephone Encounter (Signed)
[name]: Rhandi/Patient; Patient Name: Monica Copeland; PCP: Lelon Perla.; Best Callback Phone Number: [phone].  Patient calling about dark stools.  ONset 1 week ago.  States did have hx of GI bleeding a year ago requiring transfusion.   Per GI bleeding protocol, advised being seen wtihin 4 hours; appt scheduled 1130 07/10/12 with Dr. Laury Axon.   Patient is scheduled for mammogram at 1030 07/10/12; advised to call and reschedule.  INfo to office related to this, in case office staff needs to cancel/reschedule mammogram for the patient.

## 2012-07-11 ENCOUNTER — Other Ambulatory Visit: Payer: Self-pay | Admitting: Family Medicine

## 2012-07-11 ENCOUNTER — Ambulatory Visit: Payer: Medicare Other | Admitting: Nurse Practitioner

## 2012-07-11 ENCOUNTER — Encounter: Payer: Self-pay | Admitting: *Deleted

## 2012-07-11 ENCOUNTER — Telehealth: Payer: Self-pay

## 2012-07-11 DIAGNOSIS — Z1231 Encounter for screening mammogram for malignant neoplasm of breast: Secondary | ICD-10-CM

## 2012-07-11 NOTE — Telephone Encounter (Signed)
No Answer       KP 

## 2012-07-11 NOTE — Telephone Encounter (Signed)
Message copied by Arnette Norris on Thu Jul 11, 2012  5:03 PM ------      Message from: Lelon Perla      Created: Mon Jul 08, 2012 12:55 PM       Change cholesterol med to liptruzet 10/20  #30  1 po qhs  , 2 refills and recheck 3 months.   272.4  Lipid, hep

## 2012-07-12 ENCOUNTER — Telehealth: Payer: Self-pay

## 2012-07-12 MED ORDER — EZETIMIBE-ATORVASTATIN 10-20 MG PO TABS: 1.0000 | ORAL_TABLET | Freq: Every day | ORAL | Status: DC

## 2012-07-12 NOTE — Telephone Encounter (Signed)
Prior authorization initiated awaiting response. Liptruzet 10- 20mg  tablet 1 tab po qhs.      MW

## 2012-07-12 NOTE — Telephone Encounter (Signed)
Discussed with patient and she voiced understanding, ans agreed to stop the Vytorin-- Rx faxed.     KP

## 2012-07-30 ENCOUNTER — Telehealth: Payer: Self-pay | Admitting: Family Medicine

## 2012-07-30 DIAGNOSIS — M546 Pain in thoracic spine: Secondary | ICD-10-CM

## 2012-07-30 MED ORDER — OXYCODONE-ACETAMINOPHEN 5-325 MG PO TABS: 1.0000 | ORAL_TABLET | Freq: Three times a day (TID) | ORAL | Status: DC | PRN

## 2012-07-30 NOTE — Telephone Encounter (Signed)
Ok for #30, Dr Laury Axon can refill remaining quantity if she wants

## 2012-07-30 NOTE — Telephone Encounter (Signed)
Patient aware Rx ready for pick up.      KP 

## 2012-07-30 NOTE — Telephone Encounter (Signed)
Last seen 07/10/12 and filled 06/21/12 #60. Please advise    KP

## 2012-07-30 NOTE — Telephone Encounter (Signed)
Pt called requesting rx for oxycodone 5-325 mg. Call [phone] when ready.

## 2012-08-07 ENCOUNTER — Ambulatory Visit: Payer: Medicare Other | Admitting: Internal Medicine

## 2012-08-13 ENCOUNTER — Other Ambulatory Visit: Payer: Self-pay | Admitting: *Deleted

## 2012-08-13 DIAGNOSIS — M546 Pain in thoracic spine: Secondary | ICD-10-CM

## 2012-08-13 MED ORDER — OXYCODONE-ACETAMINOPHEN 5-325 MG PO TABS: 1.0000 | ORAL_TABLET | Freq: Three times a day (TID) | ORAL | Status: DC | PRN

## 2012-08-13 NOTE — Telephone Encounter (Signed)
Ok to refill ---needs to sign agreement and uds

## 2012-08-13 NOTE — Telephone Encounter (Signed)
Last OV 07-10-29, last filled 07-30-10  Left message to call office

## 2012-08-13 NOTE — Telephone Encounter (Signed)
Patient aware Rx ready for pick up.      KP 

## 2012-08-14 ENCOUNTER — Encounter: Payer: Self-pay | Admitting: Lab

## 2012-08-14 ENCOUNTER — Ambulatory Visit: Payer: Medicare Other | Admitting: Internal Medicine

## 2012-08-14 ENCOUNTER — Telehealth: Payer: Self-pay | Admitting: Family Medicine

## 2012-08-14 NOTE — Telephone Encounter (Signed)
Pt called and stated she has Lost her rx that she picked up today. She needs you to call her.

## 2012-08-15 ENCOUNTER — Telehealth: Payer: Self-pay

## 2012-08-15 DIAGNOSIS — M546 Pain in thoracic spine: Secondary | ICD-10-CM

## 2012-08-15 MED ORDER — OXYCODONE-ACETAMINOPHEN 5-325 MG PO TABS: 1.0000 | ORAL_TABLET | Freq: Three times a day (TID) | ORAL | Status: AC | PRN

## 2012-08-15 NOTE — Telephone Encounter (Signed)
Call handled      KP

## 2012-08-15 NOTE — Telephone Encounter (Signed)
Spoke with patient who lost her Rx yesterday, I called [facility] and they have not received or filled the Rx. Please advise    KP

## 2012-08-15 NOTE — Telephone Encounter (Signed)
msg left to make the patient aware Rx ready for pick up after 2 pm.      KP

## 2012-08-15 NOTE — Telephone Encounter (Signed)
Ok to reprint this time but remind her we will not be able to do it again.

## 2012-08-22 ENCOUNTER — Ambulatory Visit
Admission: RE | Admit: 2012-08-22 | Discharge: 2012-08-22 | Disposition: A | Discharge status: Home / Self Care | Payer: Medicare Other | Source: Ambulatory Visit | Attending: Family Medicine | Admitting: Family Medicine

## 2012-08-22 DIAGNOSIS — IMO0002 Reserved for concepts with insufficient information to code with codable children: Secondary | ICD-10-CM

## 2012-08-22 DIAGNOSIS — Z1231 Encounter for screening mammogram for malignant neoplasm of breast: Secondary | ICD-10-CM

## 2012-08-22 DIAGNOSIS — E240 Pituitary-dependent Cushing's disease: Secondary | ICD-10-CM

## 2012-09-06 ENCOUNTER — Telehealth: Payer: Self-pay | Admitting: Family Medicine

## 2012-09-06 DIAGNOSIS — E240 Pituitary-dependent Cushing's disease: Secondary | ICD-10-CM

## 2012-09-06 DIAGNOSIS — M546 Pain in thoracic spine: Secondary | ICD-10-CM

## 2012-09-06 MED ORDER — ALPRAZOLAM 0.5 MG PO TABS: 0.5000 mg | ORAL_TABLET | Freq: Every evening | ORAL | Status: DC | PRN

## 2012-09-06 MED ORDER — CORTISONE ACETATE 25 MG PO TABS: 37.5000 mg | ORAL_TABLET | Freq: Every day | ORAL | Status: DC

## 2012-09-06 MED ORDER — OXYCODONE-ACETAMINOPHEN 5-325 MG PO TABS: 1.0000 | ORAL_TABLET | Freq: Three times a day (TID) | ORAL | Status: DC | PRN

## 2012-09-06 NOTE — Telephone Encounter (Signed)
Patient aware Rx ready for pick up.      KP 

## 2012-09-06 NOTE — Telephone Encounter (Signed)
Patient called requesting refills on all her medications. Call her son at [phone] when ready for pick up.

## 2012-09-06 NOTE — Telephone Encounter (Signed)
Refill x1 

## 2012-09-06 NOTE — Telephone Encounter (Signed)
Refill: Alprazolam .5mg  tablet. Take 1 tablet at bedtime as needed for sleep. Qty 60. Last fill 07-22-12

## 2012-09-06 NOTE — Telephone Encounter (Signed)
Refill: oxycodone w apap 5/325mg  tab. Take 1 tablet by mouth every 8 hours as needed for pain. Qty 60. Last fill 07-01-12

## 2012-09-06 NOTE — Telephone Encounter (Signed)
Please advise      KP 

## 2012-09-11 ENCOUNTER — Telehealth: Payer: Self-pay

## 2012-09-11 MED ORDER — ALENDRONATE SODIUM 70 MG PO TABS: 70.0000 mg | ORAL_TABLET | ORAL | Status: DC

## 2012-09-11 NOTE — Telephone Encounter (Signed)
Discussed with patient and she voiced understanding. She has an apt to follow up tomorrow.     KP

## 2012-09-11 NOTE — Telephone Encounter (Signed)
Message copied by Arnette Norris on Wed Sep 11, 2012  1:49 PM ------      Message from: Lelon Perla      Created: Thu Aug 22, 2012  7:12 PM       Osteopenic----  Calcium 1200-1500 mg daily with vita D 1000 u daily---  If already doing that we will need to add medication  fosamx 70 mg  #4 1 po qweek, 11 refills----  Recheck 2 years

## 2012-09-12 ENCOUNTER — Encounter: Payer: Self-pay | Admitting: Family Medicine

## 2012-09-12 ENCOUNTER — Ambulatory Visit (INDEPENDENT_AMBULATORY_CARE_PROVIDER_SITE_OTHER): Payer: Medicare Other | Admitting: Family Medicine

## 2012-09-12 ENCOUNTER — Ambulatory Visit: Payer: Medicare Other | Admitting: Family Medicine

## 2012-09-12 VITALS — BP 124/82 | HR 83 | Temp 98.1°F | Wt 149.6 lb

## 2012-09-12 DIAGNOSIS — R443 Hallucinations, unspecified: Secondary | ICD-10-CM

## 2012-09-12 DIAGNOSIS — M25552 Pain in left hip: Secondary | ICD-10-CM | POA: Insufficient documentation

## 2012-09-12 DIAGNOSIS — M25559 Pain in unspecified hip: Secondary | ICD-10-CM

## 2012-09-12 DIAGNOSIS — M25551 Pain in right hip: Secondary | ICD-10-CM | POA: Insufficient documentation

## 2012-09-12 DIAGNOSIS — N390 Urinary tract infection, site not specified: Secondary | ICD-10-CM

## 2012-09-12 LAB — POCT URINALYSIS DIPSTICK
Bilirubin, UA: NEGATIVE
Glucose, UA: NEGATIVE
Ketones, UA: NEGATIVE
Nitrite, UA: NEGATIVE
Protein, UA: 30
Spec Grav, UA: 1.025
Urobilinogen, UA: 0.2
pH, UA: 5

## 2012-09-12 NOTE — Assessment & Plan Note (Addendum)
Pt was lucid today and answered all questiond appropriately Her family feels she needs to be watched at night-----fmla paper work was filled out for her and her family

## 2012-09-12 NOTE — Progress Notes (Signed)
  Subjective:    Patient ID: Monica Copeland, female    DOB: 1941-03-06, 72 y.o.   MRN: [id number]  HPI Pt here with her daughter c/o R hip pain today.  She previously came in c/o L hip pain.  xrays were ordered but pt never had them done. Daugher also states she has been hallucinating at night and called police thinking there was someone in her house.  There was no one.  She is concerned maybe there is a uti.  Pt called office yesterday and told Selena Batten her kids were trying to put her away and were driving her crazy.  She was completely lucid.   Review of Systems as above   Objective:   Physical Exam BP 124/82  Pulse 83  Temp 98.1 F (36.7 C) (Oral)  Wt 149 lb 9.6 oz (67.858 kg)  SpO2 99% General appearance: alert, cooperative and appears stated age Extremities: extremities normal, atraumatic, no cyanosis or edema Neurologic: Motor: grossly normal Reflexes: 2+ and symmetric Gait: Normal       Assessment & Plan:

## 2012-09-12 NOTE — Assessment & Plan Note (Signed)
Check xrays   

## 2012-09-12 NOTE — Patient Instructions (Signed)
Hip Pain  The hips join the upper legs to the lower pelvis. The bones, cartilage, tendons, and muscles of the hip joint perform a lot of work each day holding your body weight and allowing you to move around.  Hip pain is a common symptom. It can range from a minor ache to severe pain on 1 or both hips. Pain may be felt on the inside of the hip joint near the groin, or the outside near the buttocks and upper thigh. There may be swelling or stiffness as well. It occurs more often when a person walks or performs activity. There are many reasons hip pain can develop.  CAUSES   It is important to work with your caregiver to identify the cause since many conditions can impact the bones, cartilage, muscles, and tendons of the hips. Causes for hip pain include:   Broken (fractured) bones.   Separation of the thighbone from the hip socket (dislocation).   Torn cartilage of the hip joint.   Swelling (inflammation) of a tendon (tendonitis), the sac within the hip joint (bursitis), or a joint.   A weakening in the abdominal wall (hernia), affecting the nerves to the hip.   Arthritis in the hip joint or lining of the hip joint.   Pinched nerves in the back, hip, or upper thigh.   A bulging disc in the spine (herniated disc).   Rarely, bone infection or cancer.  DIAGNOSIS   The location of your hip pain will help your caregiver understand what may be causing the pain. A diagnosis is based on your medical history, your symptoms, results from your physical exam, and results from diagnostic tests. Diagnostic tests may include X-ray exams, a computerized magnetic scan (magnetic resonance imaging, MRI), or bone scan.  TREATMENT   Treatment will depend on the cause of your hip pain. Treatment may include:   Limiting activities and resting until symptoms improve.   Crutches or other walking supports (a cane or brace).   Ice, elevation, and compression.   Physical therapy or home exercises.    Shoe inserts or special shoes.   Losing weight.   Medications to reduce pain.   Undergoing surgery.  HOME CARE INSTRUCTIONS    Only take over-the-counter or prescription medicines for pain, discomfort, or fever as directed by your caregiver.   Put ice on the injured area:   Put ice in a plastic bag.   Place a towel between your skin and the bag.   Leave the ice on for 15 to 20 minutes at a time, 3 to 4 times a day.   Keep your leg raised (elevated) when possible to lessen swelling.   Avoid activities that cause pain.   Follow specific exercises as directed by your caregiver.   Sleep with a pillow between your legs on your most comfortable side.   Record how often you have hip pain, the location of the pain, and what it feels like. This information may be helpful to you and your caregiver.   Ask your caregiver about returning to work or sports and whether you should drive.   Follow up with your caregiver for further exams, therapy, or testing as directed.  SEEK MEDICAL CARE IF:    Your pain or swelling continues or worsens after 1 week.   You are feeling unwell or have chills.   You have increasing difficulty with walking.   You have a loss of sensation or other new symptoms.   You have questions   or concerns.  SEEK IMMEDIATE MEDICAL CARE IF:    You cannot put weight on the affected hip.   You have fallen.   You have a sudden increase in pain and swelling in your hip.   You have a fever.  MAKE SURE YOU:    Understand these instructions.   Will watch your condition.   Will get help right away if you are not doing well or get worse.  Document Released: 02/01/2010 Document Revised: 11/06/2011 Document Reviewed: 02/01/2010  [employer] Patient Information 2013 [employer].

## 2012-09-14 LAB — URINE CULTURE: Colony Count: 25000

## 2012-10-02 ENCOUNTER — Other Ambulatory Visit: Payer: Self-pay | Admitting: Family Medicine

## 2012-10-02 NOTE — Telephone Encounter (Signed)
Last seen 09/12/12 and filled 09/06/12 #60. Please advise     KP

## 2012-10-02 NOTE — Telephone Encounter (Signed)
Refill x1p

## 2012-10-02 NOTE — Telephone Encounter (Signed)
refill  ALPRAZolam (Tab) 0.5 MG Take 1 tablet (0.5 mg total) by mouth at bedtime as needed for sleep. #60 last fill 1.10.14

## 2012-10-03 MED ORDER — ALPRAZOLAM 0.5 MG PO TABS: 0.5000 mg | ORAL_TABLET | Freq: Every evening | ORAL | Status: DC | PRN

## 2012-10-11 ENCOUNTER — Ambulatory Visit: Payer: Medicare Other | Admitting: Family Medicine

## 2012-10-14 ENCOUNTER — Telehealth: Payer: Self-pay | Admitting: Family Medicine

## 2012-10-14 ENCOUNTER — Encounter: Payer: Self-pay | Admitting: Family Medicine

## 2012-10-14 DIAGNOSIS — M546 Pain in thoracic spine: Secondary | ICD-10-CM

## 2012-10-14 MED ORDER — OXYCODONE-ACETAMINOPHEN 5-325 MG PO TABS: 1.0000 | ORAL_TABLET | Freq: Three times a day (TID) | ORAL | Status: AC | PRN

## 2012-10-14 NOTE — Telephone Encounter (Signed)
Last seen 09/12/12 and filled 09/06/12 # 60. please advise     KP

## 2012-10-14 NOTE — Telephone Encounter (Signed)
Patient called requesting rx for oxycodone. Call [phone] when ready.

## 2012-10-14 NOTE — Telephone Encounter (Signed)
refil x1 

## 2012-10-14 NOTE — Telephone Encounter (Signed)
Patient aware Rx ready for pick up.      KP 

## 2012-10-17 ENCOUNTER — Encounter: Payer: Self-pay | Admitting: Cardiology

## 2012-10-18 ENCOUNTER — Ambulatory Visit: Payer: Medicare Other | Admitting: Family Medicine

## 2012-10-25 ENCOUNTER — Encounter: Payer: Self-pay | Admitting: Family Medicine

## 2012-10-25 ENCOUNTER — Ambulatory Visit (INDEPENDENT_AMBULATORY_CARE_PROVIDER_SITE_OTHER): Payer: BC Managed Care – PPO | Admitting: Family Medicine

## 2012-10-25 VITALS — BP 140/82 | HR 75 | Temp 98.1°F | Wt 147.8 lb

## 2012-10-25 DIAGNOSIS — J209 Acute bronchitis, unspecified: Secondary | ICD-10-CM

## 2012-10-25 DIAGNOSIS — E785 Hyperlipidemia, unspecified: Secondary | ICD-10-CM

## 2012-10-25 DIAGNOSIS — J45901 Unspecified asthma with (acute) exacerbation: Secondary | ICD-10-CM

## 2012-10-25 MED ORDER — PREDNISONE 10 MG PO TABS: ORAL_TABLET | ORAL | Status: DC

## 2012-10-25 MED ORDER — METHYLPREDNISOLONE ACETATE 80 MG/ML IJ SUSP
80.0000 mg | Freq: Once | INTRAMUSCULAR | Status: AC
  Administered 2012-10-25: 80 mg via INTRAMUSCULAR

## 2012-10-25 MED ORDER — IPRATROPIUM-ALBUTEROL 0.5-2.5 (3) MG/3ML IN SOLN
3.0000 mL | Freq: Once | RESPIRATORY_TRACT | Status: AC
  Administered 2012-10-25: 3 mL via RESPIRATORY_TRACT

## 2012-10-25 MED ORDER — AZITHROMYCIN 250 MG PO TABS: ORAL_TABLET | ORAL | Status: DC

## 2012-10-25 NOTE — Patient Instructions (Addendum)

## 2012-10-26 NOTE — Progress Notes (Signed)
  Subjective:    Patient ID: Monica Copeland, female    DOB: 20-Feb-1941, 72 y.o.   MRN: [id number]  HPI  pt here for f/u of cholesterol.  She was wheezing as well.  She did not complain about it but said whe was "used to it". + coughing--nonproductive.  No nasal congestion, no fever.  Pt did not use her neb today.    Review of Systems As above    Objective:   Physical Exam BP 140/82  Pulse 75  Temp(Src) 98.1 F (36.7 C) (Oral)  Wt 147 lb 12.8 oz (67.042 kg)  BMI 28.87 kg/m2  SpO2 98% General appearance: alert, cooperative, appears stated age and no distress Ears: normal TM's and external ear canals both ears Nose: Nares normal. Septum midline. Mucosa normal. No drainage or sinus tenderness. Throat: lips, mucosa, and tongue normal; teeth and gums normal Neck: no adenopathy, supple, symmetrical, trachea midline and thyroid not enlarged, symmetric, no tenderness/mass/nodules Lungs: wheezes bilaterally --duoneb given in office with little improvement. Heart: S1, S2 normal       Assessment & Plan:

## 2012-10-26 NOTE — Assessment & Plan Note (Signed)
zithromax  pred taper Depo medrol con't reg meds  rto prn

## 2012-10-26 NOTE — Assessment & Plan Note (Signed)
Check labs Cont' meds 

## 2012-11-04 ENCOUNTER — Other Ambulatory Visit: Payer: Self-pay

## 2012-11-04 MED ORDER — EZETIMIBE 10 MG PO TABS: 10.0000 mg | ORAL_TABLET | Freq: Every day | ORAL | Status: DC

## 2012-11-04 MED ORDER — ATORVASTATIN CALCIUM 20 MG PO TABS: 20.0000 mg | ORAL_TABLET | Freq: Every day | ORAL | Status: DC

## 2012-11-04 NOTE — Telephone Encounter (Signed)
Note from pharmacy that Liptruzet 10-20 is on back order due to a manufacturer recall. Dr.Lowne wants to change Rx to Lipitor 20 daily and Zetia 10 daily. Unable to let the patient know due to the phone system being down.    KP

## 2012-11-05 NOTE — Telephone Encounter (Signed)
Patient has made aware and picked the med's up yesterday. She stated she is having issues with hemorrhoids, she says every time she eats she has to make a bowel movement right away. I scheduled her an apt for thursday.     KP

## 2012-11-07 ENCOUNTER — Ambulatory Visit: Payer: BC Managed Care – PPO | Admitting: Family Medicine

## 2012-11-11 ENCOUNTER — Other Ambulatory Visit: Payer: Self-pay | Admitting: Family Medicine

## 2012-11-11 MED ORDER — OXYCODONE-ACETAMINOPHEN 5-325 MG PO TABS: 1.0000 | ORAL_TABLET | Freq: Three times a day (TID) | ORAL | Status: DC | PRN

## 2012-11-11 NOTE — Telephone Encounter (Signed)
Patient aware Rx ready for pick up.      KP 

## 2012-11-11 NOTE — Telephone Encounter (Signed)
Oxycodone refill request--Last seen 10/25/12 and filled 10/14/12 #60. Please advise     KP

## 2012-11-11 NOTE — Telephone Encounter (Signed)
pt called needs refill on Hydrocodone, could not tell me the mg, uses [place] [facility] callback# [phone]

## 2012-11-12 ENCOUNTER — Telehealth: Payer: Self-pay | Admitting: Family Medicine

## 2012-11-12 NOTE — Telephone Encounter (Signed)
Refill: Nystatin 100,000 unit/gm. Apply externally twice a day. Qty 45. Last fill 11-26-09

## 2012-11-13 MED ORDER — NYSTATIN 100000 UNIT/GM EX CREA: TOPICAL_CREAM | Freq: Two times a day (BID) | CUTANEOUS | Status: DC

## 2012-11-13 NOTE — Telephone Encounter (Signed)
Last OV 10-25-12, not on med list .Please advise

## 2012-11-13 NOTE — Telephone Encounter (Signed)
Ok to refill 

## 2012-11-29 ENCOUNTER — Ambulatory Visit (INDEPENDENT_AMBULATORY_CARE_PROVIDER_SITE_OTHER): Payer: BC Managed Care – PPO | Admitting: Family Medicine

## 2012-11-29 ENCOUNTER — Encounter: Payer: Self-pay | Admitting: Family Medicine

## 2012-11-29 VITALS — BP 124/80 | HR 109 | Temp 98.3°F | Wt 148.4 lb

## 2012-11-29 DIAGNOSIS — F329 Major depressive disorder, single episode, unspecified: Secondary | ICD-10-CM | POA: Insufficient documentation

## 2012-11-29 DIAGNOSIS — R195 Other fecal abnormalities: Secondary | ICD-10-CM

## 2012-11-29 DIAGNOSIS — K921 Melena: Secondary | ICD-10-CM

## 2012-11-29 DIAGNOSIS — F411 Generalized anxiety disorder: Secondary | ICD-10-CM

## 2012-11-29 LAB — CBC WITH DIFFERENTIAL/PLATELET
Basophils Absolute: 0 10*3/uL (ref 0.0–0.1)
Basophils Relative: 0 % (ref 0–1)
MCHC: 30.5 g/dL (ref 30.0–36.0)
Neutro Abs: 10.2 10*3/uL — ABNORMAL HIGH (ref 1.7–7.7)
Neutrophils Relative %: 85 % — ABNORMAL HIGH (ref 43–77)
RDW: 20.3 % — ABNORMAL HIGH (ref 11.5–15.5)

## 2012-11-29 MED ORDER — FLUOXETINE HCL 20 MG PO TABS: 20.0000 mg | ORAL_TABLET | Freq: Every day | ORAL | Status: DC

## 2012-11-29 NOTE — Patient Instructions (Addendum)
Rectal Bleeding Rectal bleeding is when blood passes out of the anus. It is usually a sign that something is wrong. It may not be serious, but it should always be evaluated. Rectal bleeding may present as bright red blood or extremely dark stools. The color may range from dark red or maroon to black (like tar). It is important that the cause of rectal bleeding be identified so treatment can be started and the problem corrected. CAUSES   Hemorrhoids. These are enlarged (dilated) blood vessels or veins in the anal or rectal area.  Fistulas. Theseare abnormal, burrowing channels that usually run from inside the rectum to the skin around the anus. They can bleed.  Anal fissures. This is a tear in the tissue of the anus. Bleeding occurs with bowel movements.  Diverticulosis. This is a condition in which pockets or sacs project from the bowel wall. Occasionally, the sacs can bleed.  Diverticulitis. Thisis an infection involving diverticulosis of the colon.  Proctitis and colitis. These are conditions in which the rectum, colon, or both, can become inflamed and pitted (ulcerated).  Polyps and cancer. Polyps are non-cancerous (benign) growths in the colon that may bleed. Certain types of polyps turn into cancer.  Protrusion of the rectum. Part of the rectum can project from the anus and bleed.  Certain medicines.  Intestinal infections.  Blood vessel abnormalities. HOME CARE INSTRUCTIONS  Eat a high-fiber diet to keep your stool soft.  Limit activity.  Drink enough fluids to keep your urine clear or pale yellow.  Warm baths may be useful to soothe rectal pain.  Follow up with your caregiver as directed. SEEK IMMEDIATE MEDICAL CARE IF:  You develop increased bleeding.  You have black or dark red stools.  You vomit blood or material that looks like coffee grounds.  You have abdominal pain or tenderness.  You have a fever.  You feel weak, nauseous, or you faint.  You have  severe rectal pain or you are unable to have a bowel movement. MAKE SURE YOU:  Understand these instructions.  Will watch your condition.  Will get help right away if you are not doing well or get worse. Document Released: 02/03/2002 Document Revised: 11/06/2011 Document Reviewed: 01/29/2011 Exit[employer] Patient Information 2013 [employer].  

## 2012-11-29 NOTE — Assessment & Plan Note (Signed)
F/u GI Check cbc

## 2012-11-29 NOTE — Assessment & Plan Note (Signed)
Switch from paxil to prozac con't xanax

## 2012-11-29 NOTE — Progress Notes (Signed)
  Subjective:    Patient ID: Monica Copeland, female    DOB: December 17, 1940, 72 y.o.   MRN: [id number]  HPI Pt here c/o hemorrhoids and loose stools after eating.  Pt denies abd pain, no constipation, no blood.,  No NVD.  Pt also c/o insomnia, and inc anxiety.  She would like to go back on her prozac.   She is not suicidal.  No other complaints.     Review of Systems As above     Objective:   Physical Exam  BP 124/80  Pulse 109  Temp(Src) 98.3 F (36.8 C) (Oral)  Wt 148 lb 6.4 oz (67.314 kg)  BMI 28.98 kg/m2  SpO2 96% General appearance: alert, cooperative, appears stated age and no distress Abdomen: soft, non-tender; bowel sounds normal; no masses,  no organomegaly Extremities: extremities normal, atraumatic, no cyanosis or edema Rectal--  Small ext hemorrhoid----- heme + brown stool Psych-- no anxiety today or depression      Assessment & Plan:

## 2012-12-03 ENCOUNTER — Encounter: Payer: Self-pay | Admitting: Internal Medicine

## 2012-12-06 ENCOUNTER — Telehealth: Payer: Self-pay

## 2012-12-06 ENCOUNTER — Other Ambulatory Visit (INDEPENDENT_AMBULATORY_CARE_PROVIDER_SITE_OTHER): Payer: BC Managed Care – PPO

## 2012-12-06 DIAGNOSIS — D7289 Other specified disorders of white blood cells: Secondary | ICD-10-CM

## 2012-12-06 DIAGNOSIS — E785 Hyperlipidemia, unspecified: Secondary | ICD-10-CM

## 2012-12-06 DIAGNOSIS — R7309 Other abnormal glucose: Secondary | ICD-10-CM

## 2012-12-06 DIAGNOSIS — E240 Pituitary-dependent Cushing's disease: Secondary | ICD-10-CM

## 2012-12-06 LAB — BASIC METABOLIC PANEL
BUN: 26 mg/dL — ABNORMAL HIGH (ref 6–23)
CO2: 27 mEq/L (ref 19–32)
Chloride: 102 mEq/L (ref 96–112)
Creatinine, Ser: 1.2 mg/dL (ref 0.4–1.2)
Glucose, Bld: 95 mg/dL (ref 70–99)

## 2012-12-06 LAB — CBC WITH DIFFERENTIAL/PLATELET
Basophils Absolute: 0 10*3/uL (ref 0.0–0.1)
HCT: 32.7 % — ABNORMAL LOW (ref 36.0–46.0)
Lymphs Abs: 1.3 10*3/uL (ref 0.7–4.0)
MCV: 69.4 fl — ABNORMAL LOW (ref 78.0–100.0)
Monocytes Absolute: 0.7 10*3/uL (ref 0.1–1.0)
Neutro Abs: 9.7 10*3/uL — ABNORMAL HIGH (ref 1.4–7.7)
Platelets: 293 10*3/uL (ref 150.0–400.0)
RDW: 22 % — ABNORMAL HIGH (ref 11.5–14.6)

## 2012-12-06 LAB — LIPID PANEL
Cholesterol: 180 mg/dL (ref 0–200)
Total CHOL/HDL Ratio: 4
Triglycerides: 156 mg/dL — ABNORMAL HIGH (ref 0.0–149.0)

## 2012-12-06 LAB — HEPATIC FUNCTION PANEL
Alkaline Phosphatase: 74 U/L (ref 39–117)
Bilirubin, Direct: 0 mg/dL (ref 0.0–0.3)
Total Bilirubin: 0.3 mg/dL (ref 0.3–1.2)

## 2012-12-06 MED ORDER — OXYCODONE-ACETAMINOPHEN 5-325 MG PO TABS: 1.0000 | ORAL_TABLET | Freq: Three times a day (TID) | ORAL | Status: DC | PRN

## 2012-12-06 NOTE — Telephone Encounter (Signed)
Call from the pharmacy stating that the Cortisone tablets in no longer available. She would like to know if you can change to something else. Please advise     KP

## 2012-12-06 NOTE — Telephone Encounter (Signed)
Spoke with patient and she was seeing Endo at [facility], she had not seen him in over a year. I went ahead and put in a stat referral to Endo due to patient only having 3 cortisone pills left. The patient is aware and so is Dr.Lowne and Luster Landsberg (who is working on referral at this time).     KP

## 2012-12-06 NOTE — Telephone Encounter (Signed)
She needs to discuss with Dr Talmage Nap

## 2012-12-09 ENCOUNTER — Telehealth: Payer: Self-pay | Admitting: Family Medicine

## 2012-12-09 NOTE — Telephone Encounter (Signed)
Patient Information:  [name] Name: Kimberely  Phone: [phone]  Patient: Monica Copeland, Monica Copeland  Gender: Female  DOB: [birth year]  Age: 72 Years  PCP: Lelon Perla.  Office Follow Up:  Does the office need to follow up with this patient?: No  Instructions For The Office: N/A  RN Note:  Back pain rated 5/10 while sitting; pain 10/10 when walks. Using Oxycodone BID. Advised to go to [facility] since (at 1650) it is too late for office visit. States no ride to Urgent Care; requested appointment for 12/10/12.  Symptoms  Reason For Call & Symptoms: Increasing back pain in lower back and left side.  Painful to touch back.  Can "barely walk."  Reviewed Health History In EMR: Yes  Reviewed Medications In EMR: Yes  Reviewed Allergies In EMR: Yes  Reviewed Surgeries / Procedures: Yes  Date of Onset of Symptoms: 12/02/2012  Treatments Tried: Oxycodone/Acetaminophen,  heating pad at times  Treatments Tried Worked: Yes  Guideline(s) Used:  Back Pain  Disposition Per Guideline:   Go to ED Now (or to Office with PCP Approval)  Reason For Disposition Reached:   Weakness of a leg or foot (e.g., unable to bear weight, dragging foot)  Advice Given:  Call Back If:  Numbness or weakness occur  Bowel/bladder problems occur  You become worse.  Patient Refused Recommendation:  Patient Will Follow Up With Office Later  Scheduled for 1115 12/10/12 with Dr Laury Axon per caller request.

## 2012-12-09 NOTE — Telephone Encounter (Signed)
Opened in error. BC °

## 2012-12-10 ENCOUNTER — Encounter: Payer: Self-pay | Admitting: Family Medicine

## 2012-12-10 ENCOUNTER — Ambulatory Visit (INDEPENDENT_AMBULATORY_CARE_PROVIDER_SITE_OTHER): Payer: BC Managed Care – PPO | Admitting: Family Medicine

## 2012-12-10 VITALS — BP 144/82 | HR 75 | Temp 98.3°F | Wt 148.0 lb

## 2012-12-10 DIAGNOSIS — M545 Low back pain: Secondary | ICD-10-CM

## 2012-12-10 MED ORDER — CYCLOBENZAPRINE HCL 10 MG PO TABS: 10.0000 mg | ORAL_TABLET | Freq: Three times a day (TID) | ORAL | Status: DC | PRN

## 2012-12-10 NOTE — Patient Instructions (Signed)
Back Pain, Adult Low back pain is very common. About 1 in 5 people have back pain.The cause of low back pain is rarely dangerous. The pain often gets better over time.About half of people with a sudden onset of back pain feel better in just 2 weeks. About 8 in 10 people feel better by 6 weeks.  CAUSES Some common causes of back pain include:  Strain of the muscles or ligaments supporting the spine.  Wear and tear (degeneration) of the spinal discs.  Arthritis.  Direct injury to the back. DIAGNOSIS Most of the time, the direct cause of low back pain is not known.However, back pain can be treated effectively even when the exact cause of the pain is unknown.Answering your caregiver's questions about your overall health and symptoms is one of the most accurate ways to make sure the cause of your pain is not dangerous. If your caregiver needs more information, he or she may order lab work or imaging tests (X-rays or MRIs).However, even if imaging tests show changes in your back, this usually does not require surgery. HOME CARE INSTRUCTIONS For many people, back pain returns.Since low back pain is rarely dangerous, it is often a condition that people can learn to manageon their own.   Remain active. It is stressful on the back to sit or stand in one place. Do not sit, drive, or stand in one place for more than 30 minutes at a time. Take short walks on level surfaces as soon as pain allows.Try to increase the length of time you walk each day.  Do not stay in bed.Resting more than 1 or 2 days can delay your recovery.  Do not avoid exercise or work.Your body is made to move.It is not dangerous to be active, even though your back may hurt.Your back will likely heal faster if you return to being active before your pain is gone.  Pay attention to your body when you bend and lift. Many people have less discomfortwhen lifting if they bend their knees, keep the load close to their bodies,and  avoid twisting. Often, the most comfortable positions are those that put less stress on your recovering back.  Find a comfortable position to sleep. Use a firm mattress and lie on your side with your knees slightly bent. If you lie on your back, put a pillow under your knees.  Only take over-the-counter or prescription medicines as directed by your caregiver. Over-the-counter medicines to reduce pain and inflammation are often the most helpful.Your caregiver may prescribe muscle relaxant drugs.These medicines help dull your pain so you can more quickly return to your normal activities and healthy exercise.  Put ice on the injured area.  Put ice in a plastic bag.  Place a towel between your skin and the bag.  Leave the ice on for 15 to 20 minutes, 3 to 4 times a day for the first 2 to 3 days. After that, ice and heat may be alternated to reduce pain and spasms.  Ask your caregiver about trying back exercises and gentle massage. This may be of some benefit.  Avoid feeling anxious or stressed.Stress increases muscle tension and can worsen back pain.It is important to recognize when you are anxious or stressed and learn ways to manage it.Exercise is a great option. SEEK MEDICAL CARE IF:  You have pain that is not relieved with rest or medicine.  You have pain that does not improve in 1 week.  You have new symptoms.  You are generally   not feeling well. SEEK IMMEDIATE MEDICAL CARE IF:   You have pain that radiates from your back into your legs.  You develop new bowel or bladder control problems.  You have unusual weakness or numbness in your arms or legs.  You develop nausea or vomiting.  You develop abdominal pain.  You feel faint. Document Released: 08/14/2005 Document Revised: 02/13/2012 Document Reviewed: 01/02/2011 Exit[employer] Patient Information 2013 [employer].  

## 2012-12-10 NOTE — Telephone Encounter (Signed)
Patient Refused Recommendation:  Patient Will Follow Up With Office Later  Scheduled for 1115 12/10/12 with Dr Laury Axon per caller request.

## 2012-12-10 NOTE — Progress Notes (Signed)
  Subjective:    Monica Copeland is a 72 y.o. female who presents for follow up of low back problems. Current symptoms include: pain in low back (aching, sharp and shooting in character; 8/10 in severity) and weakness in both legs L>R. Symptoms have significantly worsened from the previous visit. Exacerbating factors identified by the patient are bending forwards, sitting, standing and walking.  The following portions of the patient's history were reviewed and updated as appropriate: allergies, current medications, past family history, past medical history, past social history, past surgical history and problem list.    Objective:    BP 144/82  Pulse 75  Temp(Src) 98.3 F (36.8 C) (Oral)  Wt 148 lb (67.132 kg)  BMI 28.9 kg/m2  SpO2 97% General appearance: alert, cooperative, appears stated age and no distress Extremities: extremities normal, atraumatic, no cyanosis or edema Neurologic: Motor: weakness both legs L>R  with flexion of hip and knee, weak ext also Reflexes: 2+ and symmetric Coordination: poor balance Gait: Antalgic    Assessment:    Nonspecific acute low back pain    Plan:    Natural history and expected course discussed. Questions answered. Short (2-4 day) period of relative rest recommended until acute symptoms improve. Ice to affected area as needed for local pain relief. Heat to affected area as needed for local pain relief. MRI of the affected area due to presence of weakness in legs and radiation to feet. Muscle relaxants per medication orders. Follow-up in 2 weeks.

## 2012-12-12 ENCOUNTER — Encounter: Payer: Self-pay | Admitting: Endocrinology

## 2012-12-12 ENCOUNTER — Ambulatory Visit (INDEPENDENT_AMBULATORY_CARE_PROVIDER_SITE_OTHER): Payer: Medicare Other | Admitting: Endocrinology

## 2012-12-12 VITALS — BP 126/80 | HR 73 | Wt 157.0 lb

## 2012-12-12 DIAGNOSIS — N289 Disorder of kidney and ureter, unspecified: Secondary | ICD-10-CM

## 2012-12-12 DIAGNOSIS — E230 Hypopituitarism: Secondary | ICD-10-CM

## 2012-12-12 LAB — PROLACTIN: Prolactin: 0.7 ng/mL

## 2012-12-12 MED ORDER — PREDNISONE 5 MG PO TABS: 5.0000 mg | ORAL_TABLET | Freq: Every day | ORAL | Status: DC

## 2012-12-12 NOTE — Patient Instructions (Addendum)
please change hydrocortisone to prednisone.  i have sent a prescription to your pharmacy. Please come back for a follow-up appointment for 1 month.   blood tests are being requested for you today.  We'll contact you with results.

## 2012-12-12 NOTE — Progress Notes (Signed)
Subjective:    Patient ID: Monica Copeland, female    DOB: 11-02-1940, 72 y.o.   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  She has been on hydrocortisone supplementation since then.  She says this drug has become difficult to obtain, though.  She was rx'ed prednisone for asthma, but did not tolerate this (insomnia).  She has no h/o bony fracture.   She reports 1 month of slight headache, worst at the bioccipital areas, and assoc wieight gain.  Past Medical History  Diagnosis Date  . Asthma   . Back pain   . Seizures   . Anxiety   . Cushing disease   . GERD (gastroesophageal reflux disease) 01/24/2012  . Emphysema of lung   . Hyperlipidemia   . Ulcer     Past Surgical History  Procedure Laterality Date  . Brain surgery  1987  . Colon surgery    . Abdominal hysterectomy    . Appendectomy    . Candida esophagitis    . Pylorus ulcer      History   Social History  . Marital Status: Widowed    Spouse Name: N/A    Number of Children: N/A  . Years of Education: N/A   Occupational History  . Not on file.   Social History Main Topics  . Smoking status: Former Smoker -- 1.00 packs/day    Types: Cigarettes    Quit date: 12/27/2011  . Smokeless tobacco: Never Used  . Alcohol Use: No  . Drug Use: No  . Sexually Active: Not Currently   Other Topics Concern  . Not on file   Social History Narrative  . No narrative on file    Current Outpatient Prescriptions on File Prior to Visit  Medication Sig Dispense Refill  . alendronate (FOSAMAX) 70 MG tablet Take 1 tablet (70 mg total) by mouth every 7 (seven) days. Take with a full glass of water on an empty stomach.  4 tablet  11  . ALPRAZolam (XANAX) 0.5 MG tablet Take 1 tablet (0.5 mg total) by mouth at bedtime as needed for sleep.  60 tablet  0  . atorvastatin (LIPITOR) 20 MG tablet Take 1 tablet (20 mg total) by mouth  daily.  30 tablet  2  . cyclobenzaprine (FLEXERIL) 10 MG tablet Take 1 tablet (10 mg total) by mouth 3 (three) times daily as needed for muscle spasms.  30 tablet  0  . ezetimibe (ZETIA) 10 MG tablet Take 1 tablet (10 mg total) by mouth daily.  30 tablet  2  . FLUoxetine (PROZAC) 20 MG tablet Take 1 tablet (20 mg total) by mouth daily.  30 tablet  3  . levalbuterol (XOPENEX) 0.63 MG/3ML nebulizer solution Take 3 mLs (0.63 mg total) by nebulization every 6 (six) hours as needed for wheezing or shortness of breath.  3 mL  2  . nystatin cream (MYCOSTATIN) Apply topically 2 (two) times daily.  30 g  0  . oxyCODONE-acetaminophen (PERCOCET/ROXICET) 5-325 MG per tablet Take 1 tablet by mouth every 8 (eight) hours as needed for pain.  60 tablet  0  . pantoprazole (PROTONIX) 40 MG tablet Take 1 tablet (40 mg total) by mouth 2 (two) times daily.  60 tablet  11  . PARoxetine (PAXIL) 30 MG tablet Take 1 tablet by mouth daily.      . pramipexole (MIRAPEX) 1 MG tablet  Take 1 tablet (1 mg total) by mouth at bedtime.  30 tablet  11  . PROAIR HFA 108 (90 BASE) MCG/ACT inhaler       . sucralfate (CARAFATE) 1 G tablet Take 1 tablet (1 g total) by mouth 4 (four) times daily.  120 tablet  2  . zonisamide (ZONEGRAN) 100 MG capsule Take 3 capsules (300 mg total) by mouth daily. Take 1 capsule in the morning and 2 capsules in the evening  90 capsule  2   No current facility-administered medications on file prior to visit.    Allergies  Allergen Reactions  . Sleep Aid (Diphenhydramine) Other (See Comments)    Pt experiences reverse reaction: lots of energy instead of sedative effect  . Tomato Rash    Family History  Problem Relation Age of Onset  . Hypertension Mother   . Hypertension Brother   . Heart attack Mother   . Diabetes Father     BP 126/80  Pulse 73  Wt 157 lb (71.215 kg)  BMI 30.66 kg/m2  SpO2 97%    Review of Systems denies menopausal sxs, hirsutism, polyuria, sob, insomnia,  hyperpigmentation, numbness, easy bruising, and rash on the abdomen.  She has chronic bilat temporal hemianopsia, muscle cramps, easy weakness, depression, and excessive diaphoresis.      Objective:   Physical Exam VS: see vs page GEN: no distress HEAD: head: no deformity eyes: no periorbital swelling, no proptosis external nose and ears are normal mouth: no lesion seen NECK: supple, thyroid is not enlarged CHEST WALL: no deformity LUNGS:  Clear to auscultation.  CV: reg rate and rhythm, no murmur ABD: abdomen is soft, nontender.  no hepatosplenomegaly.  not distended.  no hernia.  No striae. MUSCULOSKELETAL: muscle bulk and strength are grossly normal.  no obvious joint swelling.  gait is normal and steady EXTEMITIES: no deformity.  no edema PULSES: dorsalis pedis intact bilat.  no carotid bruit NEURO:  cn 2-12 grossly intact.   readily moves all 4's.  sensation is intact to touch on all 4's.   SKIN:  Normal texture and temperature.  No rash or suspicious lesion is visible.   NODES:  None palpable at the neck PSYCH: alert, oriented x3.  Does not appear anxious nor depressed.  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

## 2012-12-13 LAB — T4, FREE: Free T4: 0.68 ng/dL (ref 0.60–1.60)

## 2012-12-13 LAB — LUTEINIZING HORMONE: LH: 4.16 m[IU]/mL

## 2012-12-13 LAB — PTH, INTACT AND CALCIUM: Calcium, Total (PTH): 9.4 mg/dL (ref 8.4–10.5)

## 2012-12-13 LAB — TSH: TSH: 0.66 u[IU]/mL (ref 0.35–5.50)

## 2012-12-13 MED ORDER — LEVOTHYROXINE SODIUM 50 MCG PO TABS: 50.0000 ug | ORAL_TABLET | Freq: Every day | ORAL | Status: DC

## 2012-12-14 ENCOUNTER — Ambulatory Visit (HOSPITAL_BASED_OUTPATIENT_CLINIC_OR_DEPARTMENT_OTHER)
Admission: RE | Admit: 2012-12-14 | Discharge: 2012-12-14 | Disposition: A | Discharge status: Home / Self Care | Payer: Medicare Other | Source: Ambulatory Visit | Attending: Family Medicine | Admitting: Family Medicine

## 2012-12-14 ENCOUNTER — Ambulatory Visit (HOSPITAL_BASED_OUTPATIENT_CLINIC_OR_DEPARTMENT_OTHER): Payer: Medicare Other

## 2012-12-14 DIAGNOSIS — M25559 Pain in unspecified hip: Secondary | ICD-10-CM

## 2012-12-14 DIAGNOSIS — M79605 Pain in left leg: Secondary | ICD-10-CM

## 2012-12-14 DIAGNOSIS — M5126 Other intervertebral disc displacement, lumbar region: Secondary | ICD-10-CM | POA: Insufficient documentation

## 2012-12-14 DIAGNOSIS — G8929 Other chronic pain: Secondary | ICD-10-CM | POA: Insufficient documentation

## 2012-12-16 ENCOUNTER — Telehealth: Payer: Self-pay | Admitting: Family Medicine

## 2012-12-16 ENCOUNTER — Telehealth: Payer: Self-pay

## 2012-12-16 DIAGNOSIS — M48061 Spinal stenosis, lumbar region without neurogenic claudication: Secondary | ICD-10-CM

## 2012-12-16 NOTE — Telephone Encounter (Signed)
Patient would like a call when this is ready.

## 2012-12-16 NOTE — Telephone Encounter (Signed)
Message copied by Arnette Norris on Mon Dec 16, 2012  8:30 AM ------      Message from: Lelon Perla      Created: Sat Dec 14, 2012  2:53 PM       Lumbar stenosis-- refer to neurosurgery ------

## 2012-12-16 NOTE — Telephone Encounter (Signed)
Patient has been made aware that it is too soon to pick up medication and she voiced understanding   KP

## 2012-12-16 NOTE — Telephone Encounter (Signed)
Patient is requesting a new prescription of hydrocodone °

## 2012-12-24 ENCOUNTER — Encounter: Payer: Self-pay | Admitting: Internal Medicine

## 2012-12-25 ENCOUNTER — Telehealth: Payer: Self-pay | Admitting: General Practice

## 2012-12-25 DIAGNOSIS — M545 Low back pain: Secondary | ICD-10-CM

## 2012-12-25 MED ORDER — CYCLOBENZAPRINE HCL 10 MG PO TABS: 10.0000 mg | ORAL_TABLET | Freq: Three times a day (TID) | ORAL | Status: DC | PRN

## 2012-12-25 NOTE — Telephone Encounter (Signed)
Cyclobenzaprine refill request. Pt last seen on 12/10/12. Medication was filled on same day #30 with 0 refills. Pt was called and states they are out. Please advise.

## 2012-12-25 NOTE — Telephone Encounter (Signed)
Refill #60 

## 2012-12-26 ENCOUNTER — Ambulatory Visit: Payer: BC Managed Care – PPO | Admitting: Internal Medicine

## 2012-12-26 MED ORDER — CYCLOBENZAPRINE HCL 10 MG PO TABS: 10.0000 mg | ORAL_TABLET | Freq: Three times a day (TID) | ORAL | Status: DC | PRN

## 2012-12-26 NOTE — Telephone Encounter (Signed)
Med filled per Dr. Laury Axon.

## 2012-12-26 NOTE — Addendum Note (Signed)
Addended by: Jackson Latino on: 12/26/2012 08:24 AM   Modules accepted: Orders

## 2012-12-27 ENCOUNTER — Telehealth: Payer: Self-pay | Admitting: General Practice

## 2012-12-27 DIAGNOSIS — M545 Low back pain: Secondary | ICD-10-CM

## 2012-12-27 MED ORDER — ALPRAZOLAM 0.5 MG PO TABS: 0.5000 mg | ORAL_TABLET | Freq: Every evening | ORAL | Status: DC | PRN

## 2012-12-27 MED ORDER — CYCLOBENZAPRINE HCL 10 MG PO TABS: 10.0000 mg | ORAL_TABLET | Freq: Three times a day (TID) | ORAL | Status: DC | PRN

## 2012-12-27 NOTE — Telephone Encounter (Signed)
Pt called wanting a refill of her Xanax sent to [facility] drug. Pt last seen on 12/12/12, med last filled on 10/03/12 #60 with 0 refills. Please advise.

## 2012-12-27 NOTE — Telephone Encounter (Signed)
Refill x1 

## 2013-01-13 ENCOUNTER — Telehealth: Payer: Self-pay | Admitting: Family Medicine

## 2013-01-13 MED ORDER — OXYCODONE-ACETAMINOPHEN 5-325 MG PO TABS
1.0000 | ORAL_TABLET | Freq: Three times a day (TID) | ORAL | Status: DC | PRN
Start: 2013-01-13 — End: 2013-03-25

## 2013-01-13 NOTE — Telephone Encounter (Signed)
Patient called requesting rx for oxycodone. Call [phone]

## 2013-01-13 NOTE — Telephone Encounter (Signed)
Med filled. Pt notified it is ready for pick up.

## 2013-01-13 NOTE — Telephone Encounter (Signed)
Refill x1 

## 2013-01-13 NOTE — Telephone Encounter (Signed)
Oxycodone refill request. Pt last seen on 12/10/12 and med last filled on 12-06-12 #60 with 0 refills. Ok to fill?

## 2013-01-15 ENCOUNTER — Ambulatory Visit: Payer: Medicare Other | Admitting: Endocrinology

## 2013-01-24 ENCOUNTER — Ambulatory Visit: Payer: Self-pay | Admitting: Internal Medicine

## 2013-01-28 ENCOUNTER — Telehealth: Payer: Self-pay | Admitting: *Deleted

## 2013-01-28 DIAGNOSIS — M545 Low back pain: Secondary | ICD-10-CM

## 2013-01-28 MED ORDER — ALPRAZOLAM 0.5 MG PO TABS: 0.5000 mg | ORAL_TABLET | Freq: Every evening | ORAL | Status: DC | PRN

## 2013-01-28 MED ORDER — CYCLOBENZAPRINE HCL 10 MG PO TABS: 10.0000 mg | ORAL_TABLET | Freq: Three times a day (TID) | ORAL | Status: DC | PRN

## 2013-01-28 NOTE — Telephone Encounter (Signed)
Refill x1   2 refills 

## 2013-01-28 NOTE — Telephone Encounter (Addendum)
Last OV 12-10-12, last filled 12-27-12 #60, Xanax 12-27-12 #60

## 2013-01-31 ENCOUNTER — Ambulatory Visit: Payer: Medicare Other | Admitting: Endocrinology

## 2013-01-31 DIAGNOSIS — Z0289 Encounter for other administrative examinations: Secondary | ICD-10-CM

## 2013-02-07 ENCOUNTER — Emergency Department (HOSPITAL_BASED_OUTPATIENT_CLINIC_OR_DEPARTMENT_OTHER): Payer: Medicare Other

## 2013-02-07 ENCOUNTER — Emergency Department (HOSPITAL_BASED_OUTPATIENT_CLINIC_OR_DEPARTMENT_OTHER)
Admission: EM | Admit: 2013-02-07 | Discharge: 2013-02-07 | Disposition: A | Discharge status: Home Health | Payer: Medicare Other | Attending: Emergency Medicine | Admitting: Emergency Medicine

## 2013-02-07 ENCOUNTER — Encounter (HOSPITAL_BASED_OUTPATIENT_CLINIC_OR_DEPARTMENT_OTHER): Payer: Self-pay

## 2013-02-07 ENCOUNTER — Encounter (HOSPITAL_BASED_OUTPATIENT_CLINIC_OR_DEPARTMENT_OTHER): Payer: Self-pay | Admitting: *Deleted

## 2013-02-07 ENCOUNTER — Ambulatory Visit: Payer: Medicare Other | Admitting: Family Medicine

## 2013-02-07 ENCOUNTER — Emergency Department (HOSPITAL_BASED_OUTPATIENT_CLINIC_OR_DEPARTMENT_OTHER)
Admission: EM | Admit: 2013-02-07 | Discharge: 2013-02-07 | Disposition: A | Discharge status: Home / Self Care | Payer: Medicare Other | Source: Home / Self Care

## 2013-02-07 DIAGNOSIS — Y9389 Activity, other specified: Secondary | ICD-10-CM | POA: Insufficient documentation

## 2013-02-07 DIAGNOSIS — Z8679 Personal history of other diseases of the circulatory system: Secondary | ICD-10-CM | POA: Insufficient documentation

## 2013-02-07 DIAGNOSIS — S99919A Unspecified injury of unspecified ankle, initial encounter: Secondary | ICD-10-CM | POA: Insufficient documentation

## 2013-02-07 DIAGNOSIS — Z79899 Other long term (current) drug therapy: Secondary | ICD-10-CM | POA: Insufficient documentation

## 2013-02-07 DIAGNOSIS — Y92410 Unspecified street and highway as the place of occurrence of the external cause: Secondary | ICD-10-CM | POA: Insufficient documentation

## 2013-02-07 DIAGNOSIS — K219 Gastro-esophageal reflux disease without esophagitis: Secondary | ICD-10-CM | POA: Insufficient documentation

## 2013-02-07 DIAGNOSIS — Z872 Personal history of diseases of the skin and subcutaneous tissue: Secondary | ICD-10-CM | POA: Insufficient documentation

## 2013-02-07 DIAGNOSIS — I509 Heart failure, unspecified: Secondary | ICD-10-CM | POA: Insufficient documentation

## 2013-02-07 DIAGNOSIS — J45901 Unspecified asthma with (acute) exacerbation: Secondary | ICD-10-CM

## 2013-02-07 DIAGNOSIS — Z87891 Personal history of nicotine dependence: Secondary | ICD-10-CM | POA: Insufficient documentation

## 2013-02-07 DIAGNOSIS — IMO0002 Reserved for concepts with insufficient information to code with codable children: Secondary | ICD-10-CM | POA: Insufficient documentation

## 2013-02-07 DIAGNOSIS — S8990XA Unspecified injury of unspecified lower leg, initial encounter: Secondary | ICD-10-CM | POA: Insufficient documentation

## 2013-02-07 DIAGNOSIS — Z862 Personal history of diseases of the blood and blood-forming organs and certain disorders involving the immune mechanism: Secondary | ICD-10-CM | POA: Insufficient documentation

## 2013-02-07 DIAGNOSIS — Z8639 Personal history of other endocrine, nutritional and metabolic disease: Secondary | ICD-10-CM | POA: Insufficient documentation

## 2013-02-07 DIAGNOSIS — R000 Tachycardia, unspecified: Secondary | ICD-10-CM | POA: Insufficient documentation

## 2013-02-07 DIAGNOSIS — F411 Generalized anxiety disorder: Secondary | ICD-10-CM | POA: Insufficient documentation

## 2013-02-07 DIAGNOSIS — Z8669 Personal history of other diseases of the nervous system and sense organs: Secondary | ICD-10-CM | POA: Insufficient documentation

## 2013-02-07 DIAGNOSIS — S79929A Unspecified injury of unspecified thigh, initial encounter: Secondary | ICD-10-CM | POA: Insufficient documentation

## 2013-02-07 DIAGNOSIS — S79919A Unspecified injury of unspecified hip, initial encounter: Secondary | ICD-10-CM | POA: Insufficient documentation

## 2013-02-07 DIAGNOSIS — E785 Hyperlipidemia, unspecified: Secondary | ICD-10-CM | POA: Insufficient documentation

## 2013-02-07 HISTORY — DX: Hyperlipidemia, unspecified: E78.5

## 2013-02-07 HISTORY — DX: Type 2 diabetes mellitus without complications: E11.9

## 2013-02-07 HISTORY — DX: Essential (primary) hypertension: I10

## 2013-02-07 HISTORY — DX: Unspecified asthma, uncomplicated: J45.909
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
## 2013-02-07 NOTE — ED Notes (Signed)
RT Note: Patient arrived to the ED on a nebulizer treatment with Albuterol and Atrovent per EMS. Patient continues to be on her treatment and RT will continue to monitor.
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
## 2013-02-10 ENCOUNTER — Encounter (HOSPITAL_BASED_OUTPATIENT_CLINIC_OR_DEPARTMENT_OTHER): Payer: Self-pay

## 2013-02-19 ENCOUNTER — Encounter: Payer: Self-pay | Admitting: Family Medicine

## 2013-02-19 ENCOUNTER — Ambulatory Visit (INDEPENDENT_AMBULATORY_CARE_PROVIDER_SITE_OTHER): Payer: Medicare Other | Admitting: Family Medicine

## 2013-02-19 VITALS — BP 122/80 | HR 80 | Temp 98.0°F | Wt 157.0 lb

## 2013-02-19 DIAGNOSIS — J4540 Moderate persistent asthma, uncomplicated: Secondary | ICD-10-CM

## 2013-02-19 DIAGNOSIS — J4521 Mild intermittent asthma with (acute) exacerbation: Secondary | ICD-10-CM

## 2013-02-19 DIAGNOSIS — J45909 Unspecified asthma, uncomplicated: Secondary | ICD-10-CM

## 2013-02-19 DIAGNOSIS — J4541 Moderate persistent asthma with (acute) exacerbation: Secondary | ICD-10-CM

## 2013-02-19 DIAGNOSIS — J45901 Unspecified asthma with (acute) exacerbation: Secondary | ICD-10-CM

## 2013-02-19 DIAGNOSIS — M25569 Pain in unspecified knee: Secondary | ICD-10-CM

## 2013-02-19 DIAGNOSIS — M25561 Pain in right knee: Secondary | ICD-10-CM

## 2013-02-19 MED ORDER — BUDESONIDE-FORMOTEROL FUMARATE 80-4.5 MCG/ACT IN AERO: 2.0000 | INHALATION_SPRAY | Freq: Two times a day (BID) | RESPIRATORY_TRACT | Status: DC

## 2013-02-19 MED ORDER — ALBUTEROL SULFATE (2.5 MG/3ML) 0.083% IN NEBU
2.5000 mg | INHALATION_SOLUTION | Freq: Once | RESPIRATORY_TRACT | Status: AC
  Administered 2013-02-19: 2.5 mg via RESPIRATORY_TRACT

## 2013-02-19 NOTE — Assessment & Plan Note (Signed)
con't albuterol Restart symbicort Refer to pulm

## 2013-02-19 NOTE — Progress Notes (Signed)
  Subjective:    Monica Copeland is a 72 y.o. female who presents with knee pain involving the right knee. Onset was several months ago. Inciting event: none known. Current symptoms include: popping sensation. Pain is aggravated by any weight bearing. Patient has had prior knee problems. Evaluation to date: none. Treatment to date: none.  The following portions of the patient's history were reviewed and updated as appropriate: allergies, current medications, past family history, past medical history, past social history, past surgical history and problem list.   Review of Systems Pertinent items are noted in HPI.   Objective:    BP 122/80  Pulse 80  Temp(Src) 98 F (36.7 C) (Oral)  Wt 157 lb (71.215 kg)  BMI 30.66 kg/m2  SpO2 96% Right knee: positive exam findings: + popping with ext and flexion  Left knee:  normal and no effusion, full active range of motion, no joint line tenderness, ligamentous structures intact.   X-ray right knee: no fracture, dislocation, swelling or degenerative changes noted and not done    Assessment:    Right knee pain    Plan:    Natural history and expected course discussed. Questions answered. Transport planner distributed. Rest, ice, compression, and elevation (RICE) therapy. Patellar compression sleeve. Orthopedics referral.

## 2013-02-19 NOTE — Patient Instructions (Addendum)
Knee Pain  The knee is the complex joint between your thigh and your lower leg. It is made up of bones, tendons, ligaments, and cartilage. The bones that make up the knee are:   The femur in the thigh.   The tibia and fibula in the lower leg.   The patella or kneecap riding in the groove on the lower femur.  CAUSES   Knee pain is a common complaint with many causes. A few of these causes are:   Injury, such as:   A ruptured ligament or tendon injury.   Torn cartilage.   Medical conditions, such as:   Gout   Arthritis   Infections   Overuse, over training or overdoing a physical activity.  Knee pain can be minor or severe. Knee pain can accompany debilitating injury. Minor knee problems often respond well to self-care measures or get well on their own. More serious injuries may need medical intervention or even surgery.  SYMPTOMS  The knee is complex. Symptoms of knee problems can vary widely. Some of the problems are:   Pain with movement and weight bearing.   Swelling and tenderness.   Buckling of the knee.   Inability to straighten or extend your knee.   Your knee locks and you cannot straighten it.   Warmth and redness with pain and fever.   Deformity or dislocation of the kneecap.  DIAGNOSIS   Determining what is wrong may be very straight forward such as when there is an injury. It can also be challenging because of the complexity of the knee. Tests to make a diagnosis may include:   Your caregiver taking a history and doing a physical exam.   Routine X-rays can be used to rule out other problems. X-rays will not reveal a cartilage tear. Some injuries of the knee can be diagnosed by:   Arthroscopy a surgical technique by which a small video camera is inserted through tiny incisions on the sides of the knee. This procedure is used to examine and repair internal knee joint problems. Tiny instruments can be used during arthroscopy to repair the torn knee cartilage (meniscus).   Arthrography  is a radiology technique. A contrast liquid is directly injected into the knee joint. Internal structures of the knee joint then become visible on X-ray film.   An MRI scan is a non x-ray radiology procedure in which magnetic fields and a computer produce two- or three-dimensional images of the inside of the knee. Cartilage tears are often visible using an MRI scanner. MRI scans have largely replaced arthrography in diagnosing cartilage tears of the knee.   Blood work.   Examination of the fluid that helps to lubricate the knee joint (synovial fluid). This is done by taking a sample out using a needle and a syringe.  TREATMENT  The treatment of knee problems depends on the cause. Some of these treatments are:   Depending on the injury, proper casting, splinting, surgery or physical therapy care will be needed.   Give yourself adequate recovery time. Do not overuse your joints. If you begin to get sore during workout routines, back off. Slow down or do fewer repetitions.   For repetitive activities such as cycling or running, maintain your strength and nutrition.   Alternate muscle groups. For example if you are a weight lifter, work the upper body on one day and the lower body the next.   Either tight or weak muscles do not give the proper support for your   knee. Tight or weak muscles do not absorb the stress placed on the knee joint. Keep the muscles surrounding the knee strong.   Take care of mechanical problems.   If you have flat feet, orthotics or special shoes may help. See your caregiver if you need help.   Arch supports, sometimes with wedges on the inner or outer aspect of the heel, can help. These can shift pressure away from the side of the knee most bothered by osteoarthritis.   A brace called an "unloader" brace also may be used to help ease the pressure on the most arthritic side of the knee.   If your caregiver has prescribed crutches, braces, wraps or ice, use as directed. The acronym for  this is PRICE. This means protection, rest, ice, compression and elevation.   Nonsteroidal anti-inflammatory drugs (NSAID's), can help relieve pain. But if taken immediately after an injury, they may actually increase swelling. Take NSAID's with food in your stomach. Stop them if you develop stomach problems. Do not take these if you have a history of ulcers, stomach pain or bleeding from the bowel. Do not take without your caregiver's approval if you have problems with fluid retention, heart failure, or kidney problems.   For ongoing knee problems, physical therapy may be helpful.   Glucosamine and chondroitin are over-the-counter dietary supplements. Both may help relieve the pain of osteoarthritis in the knee. These medicines are different from the usual anti-inflammatory drugs. Glucosamine may decrease the rate of cartilage destruction.   Injections of a corticosteroid drug into your knee joint may help reduce the symptoms of an arthritis flare-up. They may provide pain relief that lasts a few months. You may have to wait a few months between injections. The injections do have a small increased risk of infection, water retention and elevated blood sugar levels.   Hyaluronic acid injected into damaged joints may ease pain and provide lubrication. These injections may work by reducing inflammation. A series of shots may give relief for as long as 6 months.   Topical painkillers. Applying certain ointments to your skin may help relieve the pain and stiffness of osteoarthritis. Ask your pharmacist for suggestions. Many over the-counter products are approved for temporary relief of arthritis pain.   In some countries, doctors often prescribe topical NSAID's for relief of chronic conditions such as arthritis and tendinitis. A review of treatment with NSAID creams found that they worked as well as oral medications but without the serious side effects.  PREVENTION   Maintain a healthy weight. Extra pounds put  more strain on your joints.   Get strong, stay limber. Weak muscles are a common cause of knee injuries. Stretching is important. Include flexibility exercises in your workouts.   Be smart about exercise. If you have osteoarthritis, chronic knee pain or recurring injuries, you may need to change the way you exercise. This does not mean you have to stop being active. If your knees ache after jogging or playing basketball, consider switching to swimming, water aerobics or other low-impact activities, at least for a few days a week. Sometimes limiting high-impact activities will provide relief.   Make sure your shoes fit well. Choose footwear that is right for your sport.   Protect your knees. Use the proper gear for knee-sensitive activities. Use kneepads when playing volleyball or laying carpet. Buckle your seat belt every time you drive. Most shattered kneecaps occur in car accidents.   Rest when you are tired.  SEEK MEDICAL CARE IF:     You have knee pain that is continual and does not seem to be getting better.   SEEK IMMEDIATE MEDICAL CARE IF:   Your knee joint feels hot to the touch and you have a high fever.  MAKE SURE YOU:    Understand these instructions.   Will watch your condition.   Will get help right away if you are not doing well or get worse.  Document Released: 06/11/2007 Document Revised: 11/06/2011 Document Reviewed: 06/11/2007  [employer] Patient Information 2014 [employer].

## 2013-02-24 ENCOUNTER — Other Ambulatory Visit: Payer: Self-pay | Admitting: Family Medicine

## 2013-02-25 ENCOUNTER — Ambulatory Visit (INDEPENDENT_AMBULATORY_CARE_PROVIDER_SITE_OTHER): Payer: Medicare Other | Admitting: Pulmonary Disease

## 2013-02-25 ENCOUNTER — Encounter: Payer: Self-pay | Admitting: Pulmonary Disease

## 2013-02-25 VITALS — BP 124/76 | HR 86 | Temp 97.7°F | Ht 61.0 in | Wt 154.6 lb

## 2013-02-25 DIAGNOSIS — R0600 Dyspnea, unspecified: Secondary | ICD-10-CM

## 2013-02-25 DIAGNOSIS — J449 Chronic obstructive pulmonary disease, unspecified: Secondary | ICD-10-CM

## 2013-02-25 DIAGNOSIS — R0609 Other forms of dyspnea: Secondary | ICD-10-CM

## 2013-02-25 LAB — PULMONARY FUNCTION TEST
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
## 2013-02-25 NOTE — Patient Instructions (Addendum)
Smoking is affecting your lungs Spirometry -pre/post Continue on symbicort Use albuterol nebs

## 2013-02-25 NOTE — Progress Notes (Signed)
Spirometry pre and post done today. 

## 2013-02-25 NOTE — Assessment & Plan Note (Addendum)
Wonder if she has COPD rather than asthma, note onset in late adult life 4 y ago. Surprisingly, spirometry shows restriction rather than obstruction, no evidence of ILD or other neuromuscular cause. Will treat as COPD for now - unless it is very clear that hse has intermittent episodes of bronchospasm which would favor asthma over copd Smoking cessation was emphasised. 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

## 2013-03-07 ENCOUNTER — Ambulatory Visit: Payer: Self-pay | Admitting: Internal Medicine

## 2013-03-25 ENCOUNTER — Ambulatory Visit (INDEPENDENT_AMBULATORY_CARE_PROVIDER_SITE_OTHER): Payer: Medicare Other | Admitting: Adult Health

## 2013-03-25 ENCOUNTER — Encounter: Payer: Self-pay | Admitting: Adult Health

## 2013-03-25 VITALS — BP 132/86 | HR 78 | Temp 97.3°F | Ht 60.0 in | Wt 155.2 lb

## 2013-03-25 DIAGNOSIS — J4489 Other specified chronic obstructive pulmonary disease: Secondary | ICD-10-CM

## 2013-03-25 DIAGNOSIS — J449 Chronic obstructive pulmonary disease, unspecified: Secondary | ICD-10-CM
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
## 2013-03-25 NOTE — Patient Instructions (Addendum)
Must quit smoking .  Take Symbicort 2 puffs Twice daily  -take every day.  May use Albuterol Neb every 6hr as needed for wheezing /shortness of breath  follow up Dr. Alva  In 3 months and As needed   Please contact office for sooner follow up if symptoms do not improve or worsen or seek emergency care     

## 2013-03-25 NOTE — Assessment & Plan Note (Signed)
Must quit smoking .  Take Symbicort 2 puffs Twice daily  -take every day.  May use Albuterol Neb every 6hr as needed for wheezing /shortness of breath  follow up Dr. Vassie Loll  In 3 months and As needed   Please contact office for sooner follow up if symptoms do not improve or worsen or seek emergency care

## 2013-03-31 ENCOUNTER — Encounter: Payer: Self-pay | Admitting: Pulmonary Disease

## 2013-04-02 ENCOUNTER — Ambulatory Visit (INDEPENDENT_AMBULATORY_CARE_PROVIDER_SITE_OTHER): Payer: Medicare Other | Admitting: Internal Medicine

## 2013-04-02 ENCOUNTER — Encounter: Payer: Self-pay | Admitting: Internal Medicine

## 2013-04-02 VITALS — BP 134/68 | HR 100 | Ht 60.0 in | Wt 154.2 lb

## 2013-04-02 DIAGNOSIS — K219 Gastro-esophageal reflux disease without esophagitis: Secondary | ICD-10-CM

## 2013-04-02 DIAGNOSIS — K625 Hemorrhage of anus and rectum: Secondary | ICD-10-CM

## 2013-04-02 DIAGNOSIS — D509 Iron deficiency anemia, unspecified: Secondary | ICD-10-CM

## 2013-04-02 MED ORDER — MOVIPREP 100 G PO SOLR: 1.0000 | Freq: Once | ORAL | Status: DC

## 2013-04-02 NOTE — Patient Instructions (Addendum)
You have been scheduled for a colonoscopy with propofol. Please follow written instructions given to you at your visit today.  Please pick up your prep kit at the pharmacy within the next 1-3 days. If you use inhalers (even only as needed), please bring them with you on the day of your procedure. Your physician has requested that you go to www.startemmi.com and enter the access code given to you at your visit today. This web site gives a general overview about your procedure. However, you should still follow specific instructions given to you by our office regarding your preparation for the procedure.  CC: Dr Loreen Freud

## 2013-04-02 NOTE — Progress Notes (Signed)
HISTORY OF PRESENT ILLNESS:  Monica Copeland is a 72 y.o. female with hypertension, hyperlipidemia, asthma, anxiety, history of ulcer disease, and prior sigmoid colon resection, with temporary colostomy, presumably for complicated diverticular disease. She is sent today by her PCP regarding iron deficiency anemia. Patient's hemoglobin in April was 10.1 with MCV of 69.4. GI review of systems is remarkable for active reflux symptoms, alternating bowel habits, and minor rectal bleeding. She last underwent colonoscopy October 2001 by Dr. Luther Parody. The examination was normal. Followup in 10 years recommended. She last underwent upper endoscopy as a hospital inpatient date 2013. This to evaluate melena and anemia and Hemoccult-positive stool as well as dysphagia. She was found to have Candida esophagitis and a pyloric channel ulcer. Avoidance of unnecessary NSAIDs and indefinite PPI therapy recommended.  REVIEW OF SYSTEMS:  All non-GI ROS negative except for arthritis, anxiety, back pain  Past Medical History  Diagnosis Date  . Hypertension   . Hyperlipemia   . Asthma   . Diabetes mellitus without complication   . Asthma   . Back pain   . Seizures   . Anxiety   . Cushing disease   . GERD (gastroesophageal reflux disease) 01/24/2012  . Emphysema of lung   . Hyperlipidemia   . Ulcer   . Hemorrhoid     Past Surgical History  Procedure Laterality Date  . 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 Colon surgery      colon perforation  . Abdominal hysterectomy    . Appendectomy    . Candida esophagitis    . Pylorus ulcer      Social History Monica Copeland  reports that she has been smoking Cigarettes.  She has been smoking about 0.75 packs per day. She has never used smokeless tobacco. She reports that she does not drink alcohol or use illicit drugs.  family history includes Asthma in her father and son; Diabetes in her father; Esophageal cancer in her brother; Heart attack in  her mother; Hypertension in her brother and mother; Liver cancer in her brother; and Lung cancer in her father.  There is no history of Colon cancer.  Allergies  Allergen Reactions  . Sleep Aid (Diphenhydramine) Other (See Comments)    Pt experiences reverse reaction: lots of energy instead of sedative effect  . Tomato Rash       PHYSICAL EXAMINATION: Vital signs: BP 134/68  Pulse 100  Ht 5' (1.524 m)  Wt 154 lb 3.2 oz (69.945 kg)  BMI 30.12 kg/m2  Constitutional: generally well-appearing, no acute distress Psychiatric: alert and oriented x3, cooperative Eyes: extraocular movements intact, anicteric, conjunctiva pink Mouth: oral pharynx moist, no lesions Neck: supple no lymphadenopathy Cardiovascular: heart regular rate and rhythm, no murmur Lungs: clear to auscultation bilaterally Abdomen: soft, nontender, nondistended, no obvious ascites, no peritoneal signs, normal bowel sounds, no organomegaly Rectal: Deferred until colonoscopy Extremities: no lower extremity edema bilaterally Skin: no lesions on visible extremities Neuro: No focal deficits.   ASSESSMENT:  #1. Iron deficiency anemia #2. GERD #3. History of gastric ulcer #4. Minor rectal bleeding #5. Prior sigmoid colectomy with temporary colostomy remotely  PLAN:  #1. Colonoscopy and upper endoscopy.The nature of the procedure, as well as the risks, benefits, and alternatives were carefully and thoroughly reviewed with the patient. Ample time for discussion and questions allowed. The patient understood, was satisfied, and agreed to proceed. Movi prep prescribed. Patient instructed on its use. Suspect she will need PPI long-term. As  well, recommend iron therapy post procedures.

## 2013-04-03 ENCOUNTER — Other Ambulatory Visit: Payer: Self-pay | Admitting: Family Medicine

## 2013-04-03 ENCOUNTER — Encounter: Payer: Self-pay | Admitting: Internal Medicine

## 2013-04-03 NOTE — Telephone Encounter (Signed)
Last seen 02/19/13 and filled Protonix 03/29/12 #60 with 11 refills and cyclobenzaprine 01/28/13 #60 with 2 refills.  Please advise     KP

## 2013-04-09 ENCOUNTER — Ambulatory Visit: Payer: Medicare Other | Admitting: Family Medicine

## 2013-04-10 ENCOUNTER — Ambulatory Visit (INDEPENDENT_AMBULATORY_CARE_PROVIDER_SITE_OTHER): Payer: Medicare Other | Admitting: Family Medicine

## 2013-04-10 ENCOUNTER — Encounter: Payer: Self-pay | Admitting: Family Medicine

## 2013-04-10 VITALS — BP 126/64 | HR 95 | Temp 97.5°F | Wt 149.4 lb

## 2013-04-10 DIAGNOSIS — M25561 Pain in right knee: Secondary | ICD-10-CM

## 2013-04-10 DIAGNOSIS — E039 Hypothyroidism, unspecified: Secondary | ICD-10-CM

## 2013-04-10 DIAGNOSIS — R413 Other amnesia: Secondary | ICD-10-CM

## 2013-04-10 DIAGNOSIS — M25569 Pain in unspecified knee: Secondary | ICD-10-CM

## 2013-04-10 DIAGNOSIS — M25562 Pain in left knee: Secondary | ICD-10-CM

## 2013-04-10 LAB — POCT URINALYSIS DIPSTICK
Bilirubin, UA: NEGATIVE
Blood, UA: NEGATIVE
Glucose, UA: NEGATIVE
Nitrite, UA: NEGATIVE

## 2013-04-10 LAB — CBC WITH DIFFERENTIAL/PLATELET
Eosinophils Relative: 1.5 % (ref 0.0–5.0)
Lymphocytes Relative: 19.5 % (ref 12.0–46.0)
Monocytes Absolute: 0.7 10*3/uL (ref 0.1–1.0)
Monocytes Relative: 7.1 % (ref 3.0–12.0)
Neutrophils Relative %: 71.8 % (ref 43.0–77.0)
Platelets: 436 10*3/uL — ABNORMAL HIGH (ref 150.0–400.0)
WBC: 10.1 10*3/uL (ref 4.5–10.5)

## 2013-04-10 LAB — HEPATIC FUNCTION PANEL
Albumin: 3.4 g/dL — ABNORMAL LOW (ref 3.5–5.2)
Alkaline Phosphatase: 91 U/L (ref 39–117)
Bilirubin, Direct: 0 mg/dL (ref 0.0–0.3)
Total Bilirubin: 0.6 mg/dL (ref 0.3–1.2)

## 2013-04-10 LAB — BASIC METABOLIC PANEL
BUN: 10 mg/dL (ref 6–23)
Calcium: 8.6 mg/dL (ref 8.4–10.5)
Creatinine, Ser: 1.3 mg/dL — ABNORMAL HIGH (ref 0.4–1.2)
GFR: 41.6 mL/min — ABNORMAL LOW (ref >60.00)

## 2013-04-10 LAB — VITAMIN B12: Vitamin B-12: 463 pg/mL (ref 211–911)

## 2013-04-10 MED ORDER — NONFORMULARY OR COMPOUNDED ITEM: Status: DC

## 2013-04-10 NOTE — Patient Instructions (Addendum)
Dementia Dementia is a general term for problems with brain function. A person with dementia has memory loss and a hard time with at least one other brain function such as thinking, speaking, or problem solving. Dementia can affect social functioning, how you do your job, your mood, or your personality. The changes may be hidden for a long time. The earliest forms of this disease are usually not detected by family or friends. Dementia can be:  Irreversible.  Potentially reversible.  Partially reversible.  Progressive. This means it can get worse over time. CAUSES  Irreversible dementia causes may include:  Degeneration of brain cells (Alzheimer's disease or lewy body dementia).  Multiple small strokes (vascular dementia).  Infection (chronic meningitis or Creutzfelt-Jakob disease).  Frontotemporal dementia. This affects younger people, age 40 to 70, compared to those who have Alzheimer's disease.  Dementia associated with other disorders like Parkinson's disease, Huntington's disease, or HIV-associated dementia. Potentially or partially reversible dementia causes may include:  Medicines.  Metabolic causes such as excessive alcohol intake, vitamin B12 deficiency, or thyroid disease.  Masses or pressure in the brain such as a tumor, blood clot, or hydrocephalus. SYMPTOMS  Symptoms are often hard to detect. Family members or coworkers may not notice them early in the disease process. Different people with dementia may have different symptoms. Symptoms can include:  A hard time with memory, especially recent memory. Long-term memory may not be impaired.  Asking the same question multiple times or forgetting something someone just said.  A hard time speaking your thoughts or finding certain words.  A hard time solving problems or performing familiar tasks (such as how to use a telephone).  Sudden changes in mood.  Changes in personality, especially increasing moodiness or  mistrust.  Depression.  A hard time understanding complex ideas that were never a problem in the past. DIAGNOSIS  There are no specific tests for dementia.   Your caregiver may recommend a thorough evaluation. This is because some forms of dementia can be reversible. The evaluation will likely include a physical exam and getting a detailed history from you and a family member. The history often gives the best clues and suggestions for a diagnosis.  Memory testing may be done. A detailed brain function evaluation called neuropsychologic testing may be helpful.  Lab tests and brain imaging (such as a CT scan or MRI scan) are sometimes important.  Sometimes observation and re-evaluation over time is very helpful. TREATMENT  Treatment depends on the cause.   If the problem is a vitamin deficiency, it may be helped or cured with supplements.  For dementias such as Alzheimer's disease, medicines are available to stabilize or slow the course of the disease. There are no cures for this type of dementia.  Your caregiver can help direct you to groups, organizations, and other caregivers to help with decisions in the care of you or your loved one. HOME CARE INSTRUCTIONS The care of individuals with dementia is varied and dependent upon the progression of the dementia. The following suggestions are intended for the person living with, or caring for, the person with dementia.  Create a safe environment.  Remove the locks on bathroom doors to prevent the person from accidentally locking himself or herself in.  Use childproof latches on kitchen cabinets and any place where cleaning supplies, chemicals, or alcohol are kept.  Use childproof covers in unused electrical outlets.  Install childproof devices to keep doors and windows secured.  Remove stove knobs or install safety   knobs and an automatic shut-off on the stove.  Lower the temperature on water heaters.  Label medicines and keep them  locked up.  Secure knives, lighters, matches, power tools, and guns, and keep these items out of reach.  Keep the house free from clutter. Remove rugs or anything that might contribute to a fall.  Remove objects that might break and hurt the person.  Make sure lighting is good, both inside and outside.  Install grab rails as needed.  Use a monitoring device to alert you to falls or other needs for help.  Reduce confusion.  Keep familiar objects and people around.  Use night lights or dim lights at night.  Label items or areas.  Use reminders, notes, or directions for daily activities or tasks.  Keep a simple, consistent routine for waking, meals, bathing, dressing, and bedtime.  Create a calm, quiet environment.  Place large clocks and calendars prominently.  Display emergency numbers and home address near all telephones.  Use cues to establish different times of the day. An example is to open curtains to let the natural light in during the day.   Use effective communication.  Choose simple words and short sentences.  Use a gentle, calm tone of voice.  Be careful not to interrupt.  If the person is struggling to find a word or communicate a thought, try to provide the word or thought.  Ask one question at a time. Allow the person ample time to answer questions. Repeat the question again if the person does not respond.  Reduce nighttime restlessness.  Provide a comfortable bed.  Have a consistent nighttime routine.  Ensure a regular walking or physical activity schedule. Involve the person in daily activities as much as possible.  Limit napping during the day.  Limit caffeine.  Attend social events that stimulate rather than overwhelm the senses.  Encourage good nutrition and hydration.  Reduce distractions during meal times and snacks.  Avoid foods that are too hot or too cold.  Monitor chewing and swallowing ability.  Continue with routine vision,  hearing, dental, and medical screenings.  Only give over-the-counter or prescription medicines as directed by the caregiver.  Monitor driving abilities. Do not allow the person to drive when safe driving is no longer possible.  Register with an identification program which could provide location assistance in the event of a missing person situation. SEEK MEDICAL CARE IF:   New behavioral problems start such as moodiness, aggressiveness, or seeing things that are not there (hallucinations).  Any new problem with brain function happens. This includes problems with balance, speech, or falling a lot.  Problems with swallowing develop.  Any symptoms of other illness happen. Small changes or worsening in any aspect of brain function can be a sign that the illness is getting worse. It can also be a sign of another medical illness such as infection. Seeing a caregiver right away is important. SEEK IMMEDIATE MEDICAL CARE IF:   A fever develops.  New or worsened confusion develops.  New or worsened sleepiness develops.  Staying awake becomes hard to do. Document Released: 02/07/2001 Document Revised: 11/06/2011 Document Reviewed: 01/09/2011 Exit[employer] Patient Information 2014 [employer].  

## 2013-04-10 NOTE — Progress Notes (Signed)
  Subjective:    Monica Copeland is a 72 y.o. female who presents with knee pain involving both knees R>L.  Onset was gradual, starting about several months ago. Inciting event: none known. Current symptoms include: crepitus sensation, locking and pain located at patella. Pain is aggravated by any weight bearing. Patient has had prior knee problems. Evaluation to date: none. Treatment to date: none.  Pt friend also with her and c/o inc memory loss.  Pt has colon  The following portions of the patient's history were reviewed and updated as appropriate: allergies, current medications, past family history, past medical history, past social history, past surgical history and problem list.   Review of Systems Pertinent items are noted in HPI.   Objective:    BP 126/64  Pulse 95  Temp(Src) 97.5 F (36.4 C) (Oral)  Wt 149 lb 6.4 oz (67.767 kg)  BMI 29.18 kg/m2  SpO2 96% Right knee: positive exam findings: crepitus and pain with ext and popping, no pain with palpation  Left knee:  positive exam findings: crepitus and pain with weight bearing only    mMSE--scanned in X-ray both knees: not indicated    Assessment:    Bilateral pain-- suspect OA    Plan:    Natural history and expected course discussed. Questions answered. Transport planner distributed. Rest, ice, compression, and elevation (RICE) therapy. Patellar compression sleeve. Orthopedics referral.

## 2013-04-10 NOTE — Assessment & Plan Note (Signed)
Check labs  See MMSE Check labs

## 2013-04-15 ENCOUNTER — Telehealth: Payer: Self-pay | Admitting: Family Medicine

## 2013-04-15 ENCOUNTER — Telehealth: Payer: Self-pay

## 2013-04-15 DIAGNOSIS — E876 Hypokalemia: Secondary | ICD-10-CM

## 2013-04-15 DIAGNOSIS — D649 Anemia, unspecified: Secondary | ICD-10-CM

## 2013-04-15 MED ORDER — POTASSIUM CHLORIDE CRYS ER 20 MEQ PO TBCR: 20.0000 meq | EXTENDED_RELEASE_TABLET | Freq: Every day | ORAL | Status: DC

## 2013-04-15 MED ORDER — MOVIPREP 100 G PO SOLR: 1.0000 | Freq: Once | ORAL | Status: DC

## 2013-04-15 NOTE — Telephone Encounter (Signed)
Discussed results with Monica Copeland, verbalized understanding of all instructions. RX sent in for potassium. Monica Copeland will f/u with other family members and call back to schedule appointment to recheck labs(future orders placed)   + anemic--- She needs to take iron supplement daily--- recheck 1 months ---285.9 Cbcd, ibc, ferritin K is low--- Eat K rich foods and Kcl 20 meq 1 po qd , #30 2 refills---- recheck 1 months

## 2013-04-15 NOTE — Telephone Encounter (Signed)
Pts friend calling for lab results.

## 2013-04-15 NOTE — Telephone Encounter (Signed)
Patient drank the first batch of Moviprep incorrectly - almost a week too early.  I reprinted the instructions and tried to go over them again with patient and gave her a free voucher.  I then called a new rx for Moviprep into the pharmacy.  Pharmacist agreed to deliver the Moviprep to patient and take the voucher back to the store to run.

## 2013-04-16 ENCOUNTER — Telehealth: Payer: Self-pay

## 2013-04-16 NOTE — Telephone Encounter (Signed)
Called to try and move pts appt to 2pm tomorrow. Left message for her to call back regarding appt.

## 2013-04-17 ENCOUNTER — Ambulatory Visit (AMBULATORY_SURGERY_CENTER): Payer: Medicare Other | Admitting: Internal Medicine

## 2013-04-17 ENCOUNTER — Encounter: Payer: Self-pay | Admitting: Internal Medicine

## 2013-04-17 VITALS — BP 106/51 | HR 79 | Temp 98.4°F | Resp 20 | Ht 60.0 in | Wt 154.0 lb

## 2013-04-17 DIAGNOSIS — D1330 Benign neoplasm of unspecified part of small intestine: Secondary | ICD-10-CM

## 2013-04-17 DIAGNOSIS — K31819 Angiodysplasia of stomach and duodenum without bleeding: Secondary | ICD-10-CM

## 2013-04-17 DIAGNOSIS — K625 Hemorrhage of anus and rectum: Secondary | ICD-10-CM

## 2013-04-17 DIAGNOSIS — K219 Gastro-esophageal reflux disease without esophagitis: Secondary | ICD-10-CM

## 2013-04-17 DIAGNOSIS — D509 Iron deficiency anemia, unspecified: Secondary | ICD-10-CM

## 2013-04-17 MED ORDER — FERROUS SULFATE 325 (65 FE) MG PO TABS: 325.0000 mg | ORAL_TABLET | Freq: Two times a day (BID) | ORAL | Status: DC

## 2013-04-17 NOTE — Progress Notes (Signed)
Called to room to assist during endoscopic procedure.  Patient ID and intended procedure confirmed with present staff. Received instructions for my participation in the procedure from the performing physician.  

## 2013-04-17 NOTE — Op Note (Signed)
[facility] [address] [place], [address]   ENDOSCOPY PROCEDURE REPORT  PATIENT: Monica Copeland, Monica Copeland  MR#: [id number] BIRTHDATE: [birth year] , 71  yrs. old GENDER: Female ENDOSCOPIST: Roxy Cedar, MD REFERRED BY:  Loreen Freud, DO PROCEDURE DATE:  04/17/2013 PROCEDURE:  EGD w/ biopsy ASA CLASS:     Class II INDICATIONS:  Iron deficiency anemia. MEDICATIONS: MAC sedation, administered by CRNA and propofol (Diprivan) 130mg  IV TOPICAL ANESTHETIC: none  DESCRIPTION OF PROCEDURE: After the risks benefits and alternatives of the procedure were thoroughly explained, informed consent was obtained.  The LB [id number] endoscope was introduced through the mouth and advanced to the third portion of the duodenum. Without limitations.  The instrument was slowly withdrawn as the mucosa was fully examined.      Normal esophagus.  Tiny non-bleeding proximal gastric AVM. Otherwise normal stomach.  Normal duodenum to 4 th portion. Duodenal bx taken to r/o sprue.  Retroflexed views revealed no abnormalities.     The scope was then withdrawn from the patient and the procedure completed.  COMPLICATIONS: There were no complications. ENDOSCOPIC IMPRESSION: 1. Tiny non-bleeding proximal gastric AVM. May have other AVM's (small bowel) to account for iron deficiency anemia 2. Otherwise normal exam to 4 th portion of duodenum. S/P duodenal bx taken to r/o sprue 3. GERD  RECOMMENDATIONS: 1. Continue PPI for GERD 2. Start IRon therapy-  FeSO4 325 mg bid ; #60; 11 refills 3. Follow up with Dr. Laury Axon to monitor your blood counts  REPEAT EXAM:  eSigned:  Roxy Cedar, MD 04/17/2013 3:11 PM   MW:[id number] R Laury Axon, DO and The Patient

## 2013-04-17 NOTE — Op Note (Signed)
[facility] [address] [place], [address]   COLONOSCOPY PROCEDURE REPORT  PATIENT: Monica, Copeland  MR#: [id number] BIRTHDATE: 12/23/1940 , 71  yrs. old GENDER: Female ENDOSCOPIST: Roxy Cedar, MD REFERRED AO:ZHYQMV Lowne, DO PROCEDURE DATE:  04/17/2013 PROCEDURE:   Colonoscopy, diagnostic First Screening Colonoscopy - Avg.  risk and is 50 yrs.  old or older - No.  Prior Negative Screening - Now for repeat screening. N/A  History of Adenoma - Now for follow-up colonoscopy & has been > or = to 3 yrs.  N/A  Polyps Removed Today? No.  Recommend repeat exam, <10 yrs? No. ASA CLASS:   Class II INDICATIONS:Iron Deficiency Anemia.   Negative colon 2001 w/ Dr Luther Parody MEDICATIONS: MAC sedation, administered by CRNA and propofol (Diprivan) 90mg  IV  DESCRIPTION OF PROCEDURE:   After the risks benefits and alternatives of the procedure were thoroughly explained, informed consent was obtained.  A digital rectal exam revealed no abnormalities of the rectum.   The LB [id number]  endoscope was introduced through the anus and advanced to the cecum, which was identified by both the appendix and ileocecal valve. No adverse events experienced.   The quality of the prep was excellent, using MoviPrep  The instrument was then slowly withdrawn as the colon was fully examined.      COLON FINDINGS: A normal appearing cecum, ileocecal valve, and appendiceal orifice were identified.  The ascending, hepatic flexure, transverse, splenic flexure, descending, sigmoid colon and rectum appeared unremarkable.Normal surgical anastomosis at 25cm. No polyps or cancers were seen.  Retroflexed views revealed no abnormalities. The time to cecum=2 minutes 53 seconds.  Withdrawal time=8 minutes 30 seconds.  The scope was withdrawn and the procedure completed. COMPLICATIONS: There were no complications.  ENDOSCOPIC IMPRESSION: 1. Normal colon s/p sigmoid  colectomy  RECOMMENDATIONS: 1. Upper endoscopy today (SEE REPORT AND RECOMMENDATIONS)   eSigned:  Roxy Cedar, MD 04/17/2013 2:57 PM   cc: Lelon Perla, DO and The Patient   PATIENT NAME:  Monica, Copeland MR#: [id number]

## 2013-04-17 NOTE — Patient Instructions (Addendum)

## 2013-04-17 NOTE — Progress Notes (Signed)
Propofol given over incremental dosages 

## 2013-04-17 NOTE — Progress Notes (Signed)
Patient did not experience any of the following events: a burn prior to discharge; a fall within the facility; wrong site/side/patient/procedure/implant event; or a hospital transfer or hospital admission upon discharge from the facility. (G8907) Patient did not have preoperative order for IV antibiotic SSI prophylaxis. (G8918)  

## 2013-04-18 ENCOUNTER — Telehealth: Payer: Self-pay | Admitting: *Deleted

## 2013-04-18 NOTE — Telephone Encounter (Signed)
  Follow up Call-  Call back number 04/17/2013  Post procedure Call Back phone  # [phone]  Permission to leave phone message Yes     Patient questions:  Do you have a fever, pain , or abdominal swelling? no Pain Score  0 *  Have you tolerated food without any problems? yes  Have you been able to return to your normal activities? yes  Do you have any questions about your discharge instructions: Diet   no Medications  no Follow up visit  no  Do you have questions or concerns about your Care? no  Actions: * If pain score is 4 or above: No action needed, pain <4.

## 2013-04-22 ENCOUNTER — Encounter: Payer: Self-pay | Admitting: Internal Medicine

## 2013-04-22 NOTE — Telephone Encounter (Signed)
Pts appointment stayed as it was scheduled.

## 2013-04-25 ENCOUNTER — Other Ambulatory Visit: Payer: Self-pay | Admitting: *Deleted

## 2013-04-25 MED ORDER — PRAMIPEXOLE DIHYDROCHLORIDE 1 MG PO TABS: 1.0000 mg | ORAL_TABLET | Freq: Every day | ORAL | Status: DC

## 2013-04-25 NOTE — Telephone Encounter (Signed)
Rx was refilled for Pramipexole 1 mg. Ag cma

## 2013-05-08 ENCOUNTER — Encounter: Payer: Self-pay | Admitting: Family Medicine

## 2013-05-08 ENCOUNTER — Ambulatory Visit (INDEPENDENT_AMBULATORY_CARE_PROVIDER_SITE_OTHER): Payer: Medicare Other | Admitting: Family Medicine

## 2013-05-08 VITALS — BP 112/76 | HR 93 | Temp 98.2°F | Wt 146.2 lb

## 2013-05-08 DIAGNOSIS — F411 Generalized anxiety disorder: Secondary | ICD-10-CM

## 2013-05-08 DIAGNOSIS — M722 Plantar fascial fibromatosis: Secondary | ICD-10-CM

## 2013-05-08 DIAGNOSIS — Z23 Encounter for immunization: Secondary | ICD-10-CM

## 2013-05-08 MED ORDER — FLUOXETINE HCL 20 MG PO TABS: ORAL_TABLET | ORAL | Status: DC

## 2013-05-08 NOTE — Assessment & Plan Note (Signed)
Gel inserts Stretch in am Refer to podiatry if no relief

## 2013-05-08 NOTE — Patient Instructions (Signed)

## 2013-05-08 NOTE — Progress Notes (Signed)
  Subjective:    Patient ID: Monica Copeland, female    DOB: 18-May-1941, 72 y.o.   MRN: [id number]  HPI Pt here c/o plantar fascitis --- pt in feet when she first gets up and then it eases up. Then if she sits down and gets back up it hurts again.    Review of Systems    as above Objective:   Physical Exam BP 112/76  Pulse 93  Temp(Src) 98.2 F (36.8 C) (Oral)  Wt 146 lb 3.2 oz (66.316 kg)  BMI 28.55 kg/m2  SpO2 98% General appearance: alert, cooperative, appears stated age and no distress Lungs: clear to auscultation bilaterally Heart: S1, S2 normal Extremities: extremities normal, atraumatic, no cyanosis or edema---pain bottom of heel b/u with palpation       Assessment & Plan:

## 2013-05-08 NOTE — Assessment & Plan Note (Signed)
Refill prozac  stable 

## 2013-05-13 ENCOUNTER — Encounter: Payer: Self-pay | Admitting: Family Medicine

## 2013-06-25 ENCOUNTER — Emergency Department (HOSPITAL_COMMUNITY): Payer: Medicare Other

## 2013-06-25 ENCOUNTER — Emergency Department (HOSPITAL_COMMUNITY)
Admission: EM | Admit: 2013-06-25 | Discharge: 2013-06-25 | Disposition: A | Discharge status: Home / Self Care | Payer: Medicare Other | Attending: Emergency Medicine | Admitting: Emergency Medicine

## 2013-06-25 ENCOUNTER — Encounter (HOSPITAL_COMMUNITY): Payer: Self-pay | Admitting: Emergency Medicine

## 2013-06-25 DIAGNOSIS — F411 Generalized anxiety disorder: Secondary | ICD-10-CM | POA: Insufficient documentation

## 2013-06-25 DIAGNOSIS — I10 Essential (primary) hypertension: Secondary | ICD-10-CM | POA: Insufficient documentation

## 2013-06-25 DIAGNOSIS — Z7983 Long term (current) use of bisphosphonates: Secondary | ICD-10-CM | POA: Insufficient documentation

## 2013-06-25 DIAGNOSIS — W010XXA Fall on same level from slipping, tripping and stumbling without subsequent striking against object, initial encounter: Secondary | ICD-10-CM | POA: Insufficient documentation

## 2013-06-25 DIAGNOSIS — K219 Gastro-esophageal reflux disease without esophagitis: Secondary | ICD-10-CM | POA: Insufficient documentation

## 2013-06-25 DIAGNOSIS — Z79899 Other long term (current) drug therapy: Secondary | ICD-10-CM | POA: Insufficient documentation

## 2013-06-25 DIAGNOSIS — Y9289 Other specified places as the place of occurrence of the external cause: Secondary | ICD-10-CM | POA: Insufficient documentation

## 2013-06-25 DIAGNOSIS — IMO0002 Reserved for concepts with insufficient information to code with codable children: Secondary | ICD-10-CM | POA: Insufficient documentation

## 2013-06-25 DIAGNOSIS — E669 Obesity, unspecified: Secondary | ICD-10-CM | POA: Insufficient documentation

## 2013-06-25 DIAGNOSIS — J45909 Unspecified asthma, uncomplicated: Secondary | ICD-10-CM | POA: Insufficient documentation

## 2013-06-25 DIAGNOSIS — Z872 Personal history of diseases of the skin and subcutaneous tissue: Secondary | ICD-10-CM | POA: Insufficient documentation

## 2013-06-25 DIAGNOSIS — E785 Hyperlipidemia, unspecified: Secondary | ICD-10-CM | POA: Insufficient documentation

## 2013-06-25 DIAGNOSIS — S92301A Fracture of unspecified metatarsal bone(s), right foot, initial encounter for closed fracture: Secondary | ICD-10-CM

## 2013-06-25 DIAGNOSIS — S92309A Fracture of unspecified metatarsal bone(s), unspecified foot, initial encounter for closed fracture: Secondary | ICD-10-CM | POA: Insufficient documentation

## 2013-06-25 DIAGNOSIS — Y9389 Activity, other specified: Secondary | ICD-10-CM | POA: Insufficient documentation

## 2013-06-25 DIAGNOSIS — E119 Type 2 diabetes mellitus without complications: Secondary | ICD-10-CM | POA: Insufficient documentation

## 2013-06-25 DIAGNOSIS — F17200 Nicotine dependence, unspecified, uncomplicated: Secondary | ICD-10-CM | POA: Insufficient documentation

## 2013-06-25 MED ORDER — TRAMADOL HCL 50 MG PO TABS: 50.0000 mg | ORAL_TABLET | Freq: Four times a day (QID) | ORAL | Status: DC | PRN

## 2013-06-25 NOTE — ED Notes (Signed)
Patient transported to X-ray 

## 2013-06-25 NOTE — ED Notes (Signed)
Per EMS: pt states she tripped over a rug, has pain to top and outside of foot.

## 2013-06-25 NOTE — ED Provider Notes (Signed)
CSN: [id number]     Arrival date & time 06/25/13  4098 History   First MD Initiated Contact with Patient 06/25/13 [phone]     Chief Complaint  Patient presents with  . Foot Pain  . Fall   (Consider location/radiation/quality/duration/timing/severity/associated sxs/prior Treatment) HPI Comments: Monica Copeland is a 72 y.o. female who injured her foot today, when she stood up from a couch, then tripped on arrival. She called a neighbor to help her get up. She injured her right foot in the fall. She denies head, neck, or back injury. She denies any prodrome of dizziness, or weakness. She denies headache, nausea, or vomiting. There have been no changes in her bowel or urinary habits. She came here for evaluation by EMS. There are no other known modifying factors.   Patient is a 72 y.o. female presenting with lower extremity pain and fall. The history is provided by the patient.  Foot Pain  Fall    Past Medical History  Diagnosis Date  . Hypertension   . Hyperlipemia   . Asthma   . Diabetes mellitus without complication   . Asthma   . Back pain   . Seizures   . Anxiety   . Cushing disease   . GERD (gastroesophageal reflux disease) 01/24/2012  . Emphysema of lung   . Hyperlipidemia   . Ulcer   . Hemorrhoid    Past Surgical History  Procedure Laterality Date  . 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 Colon surgery      colon perforation  . Abdominal hysterectomy    . Appendectomy    . Candida esophagitis    . Pylorus ulcer     Family History  Problem Relation Age of Onset  . Hypertension Mother   . Hypertension Brother   . Heart attack Mother   . Diabetes Father   . Asthma Father   . Asthma Son   . Lung cancer Father   . Esophageal cancer Brother   . Liver cancer Brother     mets from esophagus  . Colon cancer Neg Hx    History  Substance Use Topics  . Smoking status: Current Some Day Smoker -- 0.75 packs/day    Types: Cigarettes  . Smokeless tobacco:  Never Used     Comment: Started smoking at age 10.form given 04-02-13  . Alcohol Use: No   OB History   Grav Para Term Preterm Abortions TAB SAB Ect Mult Living                 Review of Systems  All other systems reviewed and are negative.    Allergies  Tomato  Home Medications   Current Outpatient Rx  Name  Route  Sig  Dispense  Refill  . alendronate (FOSAMAX) 70 MG tablet   Oral   Take 1 tablet (70 mg total) by mouth every 7 (seven) days. Take with a full glass of water on an empty stomach.   4 tablet   11   . ALPRAZolam (XANAX) 0.5 MG tablet   Oral   Take 1 tablet (0.5 mg total) by mouth at bedtime as needed for sleep.   60 tablet   2   . atorvastatin (LIPITOR) 20 MG tablet   Oral   Take 1 tablet (20 mg total) by mouth daily.   30 tablet   2     Lipitor and Zetia to replace Liptruzet   . budesonide-formoterol (SYMBICORT) 80-4.5 MCG/ACT  inhaler   Inhalation   Inhale 2 puffs into the lungs as needed.         . cyclobenzaprine (FLEXERIL) 10 MG tablet      TAKE 1 TABLET (10 MG TOTAL) BY MOUTH 3 (THREE) TIMES DAILY AS NEEDED FOR MUSCLE SPASMS.   60 tablet   2   . ferrous sulfate 325 (65 FE) MG tablet   Oral   Take 1 tablet (325 mg total) by mouth 2 (two) times daily before lunch and supper.   60 tablet   11   . FLUoxetine (PROZAC) 20 MG tablet      TAKE 1 TABLET BY MOUTH DAILY.   30 tablet   5     Due for follow up. Physical in August.   . levothyroxine (SYNTHROID, LEVOTHROID) 50 MCG tablet   Oral   Take 1 tablet (50 mcg total) by mouth daily before breakfast.   30 tablet   5   . potassium chloride SA (K-DUR,KLOR-CON) 20 MEQ tablet   Oral   Take 1 tablet (20 mEq total) by mouth daily.   30 tablet   2   . pramipexole (MIRAPEX) 1 MG tablet   Oral   Take 1 tablet (1 mg total) by mouth at bedtime.   30 tablet   11   . PROAIR HFA 108 (90 BASE) MCG/ACT inhaler      as needed.          . traMADol (ULTRAM) 50 MG tablet   Oral   Take 1  tablet (50 mg total) by mouth every 6 (six) hours as needed for pain.   30 tablet   0    BP 126/70  Pulse 72  Temp(Src) 98 F (36.7 C) (Oral)  Resp 20  SpO2 99% Physical Exam  Nursing note and vitals reviewed. Constitutional: She is oriented to person, place, and time. She appears well-developed.  Elderly, obese  HENT:  Head: Normocephalic and atraumatic.  Eyes: Conjunctivae and EOM are normal. Pupils are equal, round, and reactive to light.  Neck: Normal range of motion and phonation normal. Neck supple.  Cardiovascular: Normal rate, regular rhythm and intact distal pulses.   Pulmonary/Chest: Effort normal and breath sounds normal. She exhibits no tenderness.  Abdominal: Soft. She exhibits no distension. There is no tenderness. There is no guarding.  Musculoskeletal: Normal range of motion.  No tenderness of the cervical, thoracic or lumbar spine. Right foot, tender and swollen mid lateral. Mild, right ankle, tenderness without deformity. No right knee or right hip pain with motion.  Neurological: She is alert and oriented to person, place, and time. She exhibits normal muscle tone.  Skin: Skin is warm and dry.  Psychiatric: She has a normal mood and affect. Her behavior is normal. Judgment and thought content normal.    ED Course  Procedures (including critical care time)  Cam Walker, applied for proximal right fifth metatarsal fracture.  The patient was able to ambulate using a walker, after application of the Cam Walker.    Labs Review Labs Reviewed - No data to display Imaging Review Dg Foot Complete Right  06/25/2013   CLINICAL DATA:  Larey Seat.  Lateral foot pain.  EXAM: RIGHT FOOT COMPLETE - 3+ VIEW  COMPARISON:  None.  FINDINGS: There is a nondisplaced transverse fracture through the proximal shaft of the 5th metatarsal (Jones fracture). The other bony structures are intact. The joint spaces are maintained.  IMPRESSION: Nondisplaced transverse fracture through the  proximal 5th metatarsal  shaft (Jones fracture).   Electronically Signed   By: Loralie Champagne M.D.   On: 06/25/2013 08:06    EKG Interpretation   None       MDM   1. Metatarsal fracture, right, closed, initial encounter      Metatarsal fracture, treated definitively in the emergency department. This is unlikely to require surgical intervention. This can be managed by her primary care Dr., in the office setting, as needed.   Nursing Notes Reviewed/ Care Coordinated, and agree without changes. Applicable Imaging Reviewed.  Interpretation of Laboratory Data incorporated into ED treatment   Plan: Home Medications- Ultram; Home Treatments and Observation- rest, elevation, use a Cam Walker when walking; return here if the recommended treatment, does not improve the symptoms; Recommended follow up- PCP, when necessary      Flint Melter, MD 06/25/13 [phone]

## 2013-06-30 ENCOUNTER — Ambulatory Visit: Payer: Medicare Other | Admitting: Family Medicine

## 2013-07-08 ENCOUNTER — Telehealth: Payer: Self-pay | Admitting: Family Medicine

## 2013-07-08 NOTE — Telephone Encounter (Signed)
Patient wants to know if one of her medications could be keeping he rawake. She is having a hard time falling asleep at night. Please advise.

## 2013-07-10 ENCOUNTER — Other Ambulatory Visit: Payer: Self-pay | Admitting: *Deleted

## 2013-07-10 MED ORDER — ALPRAZOLAM 0.5 MG PO TABS
0.5000 mg | ORAL_TABLET | Freq: Every evening | ORAL | Status: DC | PRN
Start: 2013-07-10 — End: 2013-07-17

## 2013-07-10 NOTE — Telephone Encounter (Signed)
Patient is requesting refill for Xanax.  Last seen-05/08/2013  Last filled-02/07/2013 #60, 2 refills  UDS-04/10/2013-low risk, contract signed  Please advise. SW

## 2013-07-17 ENCOUNTER — Telehealth: Payer: Self-pay | Admitting: Family Medicine

## 2013-07-17 MED ORDER — ALPRAZOLAM 0.5 MG PO TABS
0.5000 mg | ORAL_TABLET | Freq: Every evening | ORAL | Status: DC | PRN
Start: 2013-07-17 — End: 2013-10-28

## 2013-07-17 NOTE — Telephone Encounter (Signed)
Patient called and requested a refill for ALPRAZolam Prudy Feeler) 0.5 MG tablet also patient states that dr Laury Axon stated that she can take two a day.   Pharmacy [facility] - [place], [place] - [phone]

## 2013-07-17 NOTE — Telephone Encounter (Signed)
Last seen 05/08/13 and filled 07/10/13 #60. Please advise     KP

## 2013-07-17 NOTE — Telephone Encounter (Signed)
Rx sent      KP 

## 2013-07-17 NOTE — Telephone Encounter (Signed)
Refill x1 

## 2013-07-30 ENCOUNTER — Telehealth: Payer: Self-pay | Admitting: *Deleted

## 2013-07-30 NOTE — Telephone Encounter (Signed)
There is really nothing we can do --  She needs to call the police----

## 2013-07-30 NOTE — Telephone Encounter (Signed)
please call patient: Monica Copeland agree with dr Laury Axon
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
## 2013-07-31 NOTE — Telephone Encounter (Signed)
I will discuss with Dois Davenport for follow up.

## 2013-09-01 ENCOUNTER — Ambulatory Visit: Payer: Medicare Other | Admitting: Adult Health

## 2013-10-09 ENCOUNTER — Ambulatory Visit (INDEPENDENT_AMBULATORY_CARE_PROVIDER_SITE_OTHER): Payer: Medicare Other | Admitting: Family Medicine

## 2013-10-09 ENCOUNTER — Encounter: Payer: Self-pay | Admitting: Family Medicine

## 2013-10-09 VITALS — BP 120/72 | HR 97 | Temp 97.9°F | Wt 146.0 lb

## 2013-10-09 DIAGNOSIS — M25569 Pain in unspecified knee: Secondary | ICD-10-CM

## 2013-10-09 DIAGNOSIS — M25561 Pain in right knee: Secondary | ICD-10-CM

## 2013-10-09 NOTE — Progress Notes (Signed)
  Subjective:    Monica Copeland is a 73 y.o. female who presents with knee pain involving the right knee. Onset was several months ago. Inciting event: none known. Current symptoms include: pain located medially. Pain is aggravated by any weight bearing, going up and down stairs, lateral movements and rising after sitting. Patient has had prior knee problems. Evaluation to date: plain films: oa and Ortho consult: Injection given with relief several months ago. Treatment to date: avoidance of offending activity, ice and rest.  The following portions of the patient's history were reviewed and updated as appropriate: allergies, current medications, past family history, past medical history, past social history, past surgical history and problem list.   Review of Systems Pertinent items are noted in HPI.   Objective:    BP 120/72  Pulse 97  Temp(Src) 97.9 F (36.6 C) (Oral)  Wt 146 lb (66.225 kg)  SpO2 96% Right knee: positive exam findings: effusion and medial joint line tenderness  Left knee:  normal and no effusion, full active range of motion, no joint line tenderness, ligamentous structures intact.   X-ray right knee: not available    Assessment:    Right knee pain    Plan:    Natural history and expected course discussed. Questions answered. Scientist, clinical (histocompatibility and immunogenetics) distributed. Rest, ice, compression, and elevation (RICE) therapy. Crutches. Orthopedics referral.  Ace wrap

## 2013-10-09 NOTE — Patient Instructions (Signed)
Knee Pain Knee pain can be a result of an injury or other medical conditions. Treatment will depend on the cause of your pain. HOME CARE  Only take medicine as told by your doctor.  Keep a healthy weight. Being overweight can make the knee hurt more.  Stretch before exercising or playing sports.  If there is constant knee pain, change the way you exercise. Ask your doctor for advice.  Make sure shoes fit well. Choose the right shoe for the sport or activity.  Protect your knees. Wear kneepads if needed.  Rest when you are tired. GET HELP RIGHT AWAY IF:   Your knee pain does not stop.  Your knee pain does not get better.  Your knee joint feels hot to the touch.  You have a fever. MAKE SURE YOU:   Understand these instructions.  Will watch this condition.  Will get help right away if you are not doing well or get worse. Document Released: 11/10/2008 Document Revised: 11/06/2011 Document Reviewed: 11/10/2008 Exit[employer] Patient Information 2014 [employer].  

## 2013-10-09 NOTE — Progress Notes (Signed)
Pre visit review using our clinic review tool, if applicable. No additional management support is needed unless otherwise documented below in the visit note. 

## 2013-10-15 ENCOUNTER — Telehealth: Payer: Self-pay

## 2013-10-15 NOTE — Telephone Encounter (Signed)
Left message for call back identifiable  Flu vaccine--04/2013 PNA--03/2010--per patient Shingles--03/2010--per patient Tdap--due Bone Density--03/2012--low bone mass MMG--09/2011--neg CCS--03/2013--Dr Perry--neg [facility]--Dr Perry--Gerd

## 2013-10-16 ENCOUNTER — Encounter: Payer: Medicare Other | Admitting: Family Medicine

## 2013-10-16 NOTE — Telephone Encounter (Signed)
Patient rescheduled

## 2013-10-27 ENCOUNTER — Telehealth: Payer: Self-pay

## 2013-10-27 NOTE — Telephone Encounter (Signed)
Medication and allergies:  Reviewed and updated  90 day supply/mail order: n/a Local pharmacy:  [facility], [place]   Immunizations due:  Td-due   A/P: No changes to personal, family history or past surgical hx PAP-hx. Hysterectomy; patient stated "I haven't had one in a long time" CCS- 04/17/13-negative MMG- 08/22/12-negative; has one scheduled this week. BD- 08/22/12-negative Flu- 05/08/13 Tdap-due PNA-03/29/10 Shingles- 03/29/10  To Discuss with Provider: Needs refills on xanax and potassium chloride.

## 2013-10-28 ENCOUNTER — Telehealth: Payer: Self-pay | Admitting: Family Medicine

## 2013-10-28 ENCOUNTER — Encounter: Payer: Self-pay | Admitting: Family Medicine

## 2013-10-28 ENCOUNTER — Ambulatory Visit (INDEPENDENT_AMBULATORY_CARE_PROVIDER_SITE_OTHER): Payer: Medicare Other | Admitting: Family Medicine

## 2013-10-28 VITALS — BP 118/70 | HR 72 | Temp 98.6°F | Ht 59.0 in | Wt 139.0 lb

## 2013-10-28 DIAGNOSIS — J449 Chronic obstructive pulmonary disease, unspecified: Secondary | ICD-10-CM

## 2013-10-28 DIAGNOSIS — E2839 Other primary ovarian failure: Secondary | ICD-10-CM

## 2013-10-28 DIAGNOSIS — F411 Generalized anxiety disorder: Secondary | ICD-10-CM

## 2013-10-28 DIAGNOSIS — R7309 Other abnormal glucose: Secondary | ICD-10-CM

## 2013-10-28 DIAGNOSIS — E785 Hyperlipidemia, unspecified: Secondary | ICD-10-CM

## 2013-10-28 DIAGNOSIS — F17200 Nicotine dependence, unspecified, uncomplicated: Secondary | ICD-10-CM

## 2013-10-28 DIAGNOSIS — R739 Hyperglycemia, unspecified: Secondary | ICD-10-CM

## 2013-10-28 DIAGNOSIS — R079 Chest pain, unspecified: Secondary | ICD-10-CM

## 2013-10-28 DIAGNOSIS — Z0000 Encounter for general adult medical examination without abnormal findings: Secondary | ICD-10-CM

## 2013-10-28 LAB — HEMOGLOBIN A1C: Hgb A1c MFr Bld: 6.3 % (ref 4.6–6.5)

## 2013-10-28 MED ORDER — ALPRAZOLAM 0.5 MG PO TABS: 0.5000 mg | ORAL_TABLET | Freq: Every evening | ORAL | Status: DC | PRN

## 2013-10-28 NOTE — Patient Instructions (Addendum)
Preventive Care for Adults, Female A healthy lifestyle and preventive care can promote health and wellness. Preventive health guidelines for women include the following key practices.  A routine yearly physical is a good way to check with your health care provider about your health and preventive screening. It is a chance to share any concerns and updates on your health and to receive a thorough exam.  Visit your dentist for a routine exam and preventive care every 6 months. Brush your teeth twice a day and floss once a day. Good oral hygiene prevents tooth decay and gum disease.  The frequency of eye exams is based on your age, health, family medical history, use of contact lenses, and other factors. Follow your health care provider's recommendations for frequency of eye exams.  Eat a healthy diet. Foods like vegetables, fruits, whole grains, low-fat dairy products, and lean protein foods contain the nutrients you need without too many calories. Decrease your intake of foods high in solid fats, added sugars, and salt. Eat the right amount of calories for you.Get information about a proper diet from your health care provider, if necessary.  Regular physical exercise is one of the most important things you can do for your health. Most adults should get at least 150 minutes of moderate-intensity exercise (any activity that increases your heart rate and causes you to sweat) each week. In addition, most adults need muscle-strengthening exercises on 2 or more days a week.  Maintain a healthy weight. The body mass index (BMI) is a screening tool to identify possible weight problems. It provides an estimate of body fat based on height and weight. Your health care provider can find your BMI, and can help you achieve or maintain a healthy weight.For adults 20 years and older:  A BMI below 18.5 is considered underweight.  A BMI of 18.5 to 24.9 is normal.  A BMI of 25 to 29.9 is considered overweight.  A  BMI of 30 and above is considered obese.  Maintain normal blood lipids and cholesterol levels by exercising and minimizing your intake of saturated fat. Eat a balanced diet with plenty of fruit and vegetables. Blood tests for lipids and cholesterol should begin at age 62 and be repeated every 5 years. If your lipid or cholesterol levels are high, you are over 50, or you are at high risk for heart disease, you may need your cholesterol levels checked more frequently.Ongoing high lipid and cholesterol levels should be treated with medicines if diet and exercise are not working.  If you smoke, find out from your health care provider how to quit. If you do not use tobacco, do not start.  Lung cancer screening is recommended for adults aged 36 80 years who are at high risk for developing lung cancer because of a history of smoking. A yearly low-dose CT scan of the lungs is recommended for people who have at least a 30-pack-year history of smoking and are a current smoker or have quit within the past 15 years. A pack year of smoking is smoking an average of 1 pack of cigarettes a day for 1 year (for example: 1 pack a day for 30 years or 2 packs a day for 15 years). Yearly screening should continue until the smoker has stopped smoking for at least 15 years. Yearly screening should be stopped for people who develop a health problem that would prevent them from having lung cancer treatment.  If you are pregnant, do not drink alcohol. If you  are breastfeeding, be very cautious about drinking alcohol. If you are not pregnant and choose to drink alcohol, do not have more than 1 drink per day. One drink is considered to be 12 ounces (355 mL) of beer, 5 ounces (148 mL) of wine, or 1.5 ounces (44 mL) of liquor.  Avoid use of street drugs. Do not share needles with anyone. Ask for help if you need support or instructions about stopping the use of drugs.  High blood pressure causes heart disease and increases the risk  of stroke. Your blood pressure should be checked at least every 1 to 2 years. Ongoing high blood pressure should be treated with medicines if weight loss and exercise do not work.  If you are 39 74 years old, ask your health care provider if you should take aspirin to prevent strokes.  Diabetes screening involves taking a blood sample to check your fasting blood sugar level. This should be done once every 3 years, after age 56, if you are within normal weight and without risk factors for diabetes. Testing should be considered at a younger age or be carried out more frequently if you are overweight and have at least 1 risk factor for diabetes.  Breast cancer screening is essential preventive care for women. You should practice "breast self-awareness." This means understanding the normal appearance and feel of your breasts and may include breast self-examination. Any changes detected, no matter how small, should be reported to a health care provider. Women in their 40s and 30s should have a clinical breast exam (CBE) by a health care provider as part of a regular health exam every 1 to 3 years. After age 28, women should have a CBE every year. Starting at age 72, women should consider having a mammogram (breast X-ray test) every year. Women who have a family history of breast cancer should talk to their health care provider about genetic screening. Women at a high risk of breast cancer should talk to their health care providers about having an MRI and a mammogram every year.  Breast cancer gene (BRCA)-related cancer risk assessment is recommended for women who have family members with BRCA-related cancers. BRCA-related cancers include breast, ovarian, tubal, and peritoneal cancers. Having family members with these cancers may be associated with an increased risk for harmful changes (mutations) in the breast cancer genes BRCA1 and BRCA2. Results of the assessment will determine the need for genetic counseling  and BRCA1 and BRCA2 testing.  The Pap test is a screening test for cervical cancer. A Pap test can show cell changes on the cervix that might become cervical cancer if left untreated. A Pap test is a procedure in which cells are obtained and examined from the lower end of the uterus (cervix).  Women should have a Pap test starting at age 59.  Between ages 42 and 13, Pap tests should be repeated every 2 years.  Beginning at age 53, you should have a Pap test every 3 years as long as the past 3 Pap tests have been normal.  Some women have medical problems that increase the chance of getting cervical cancer. Talk to your health care provider about these problems. It is especially important to talk to your health care provider if a new problem develops soon after your last Pap test. In these cases, your health care provider may recommend more frequent screening and Pap tests.  The above recommendations are the same for women who have or have not gotten the vaccine  for human papillomavirus (HPV).  If you had a hysterectomy for a problem that was not cancer or a condition that could lead to cancer, then you no longer need Pap tests. Even if you no longer need a Pap test, a regular exam is a good idea to make sure no other problems are starting.  If you are between ages 58 and 10 years, and you have had normal Pap tests going back 10 years, you no longer need Pap tests. Even if you no longer need a Pap test, a regular exam is a good idea to make sure no other problems are starting.  If you have had past treatment for cervical cancer or a condition that could lead to cancer, you need Pap tests and screening for cancer for at least 20 years after your treatment.  If Pap tests have been discontinued, risk factors (such as a new sexual partner) need to be reassessed to determine if screening should be resumed.  The HPV test is an additional test that may be used for cervical cancer screening. The HPV test  looks for the virus that can cause the cell changes on the cervix. The cells collected during the Pap test can be tested for HPV. The HPV test could be used to screen women aged 67 years and older, and should be used in women of any age who have unclear Pap test results. After the age of 65, women should have HPV testing at the same frequency as a Pap test.  Colorectal cancer can be detected and often prevented. Most routine colorectal cancer screening begins at the age of 25 years and continues through age 66 years. However, your health care provider may recommend screening at an earlier age if you have risk factors for colon cancer. On a yearly basis, your health care provider may provide home test kits to check for hidden blood in the stool. Use of a small camera at the end of a tube, to directly examine the colon (sigmoidoscopy or colonoscopy), can detect the earliest forms of colorectal cancer. Talk to your health care provider about this at age 79, when routine screening begins. Direct exam of the colon should be repeated every 5 10 years through age 47 years, unless early forms of pre-cancerous polyps or small growths are found.  People who are at an increased risk for hepatitis B should be screened for this virus. You are considered at high risk for hepatitis B if:  You were born in a country where hepatitis B occurs often. Talk with your health care provider about which countries are considered high risk.  Your parents were born in a high-risk country and you have not received a shot to protect against hepatitis B (hepatitis B vaccine).  You have HIV or AIDS.  You use needles to inject street drugs.  You live with, or have sex with, someone who has Hepatitis B.  You get hemodialysis treatment.  You take certain medicines for conditions like cancer, organ transplantation, and autoimmune conditions.  Hepatitis C blood testing is recommended for all people born from [birth year] through 1965 and  any individual with known risks for hepatitis C.  Practice safe sex. Use condoms and avoid high-risk sexual practices to reduce the spread of sexually transmitted infections (STIs). STIs include gonorrhea, chlamydia, syphilis, trichomonas, herpes, HPV, and human immunodeficiency virus (HIV). Herpes, HIV, and HPV are viral illnesses that have no cure. They can result in disability, cancer, and death. Sexually active women aged 66  years and younger should be checked for chlamydia. Older women with new or multiple partners should also be tested for chlamydia. Testing for other STIs is recommended if you are sexually active and at increased risk.  Osteoporosis is a disease in which the bones lose minerals and strength with aging. This can result in serious bone fractures or breaks. The risk of osteoporosis can be identified using a bone density scan. Women ages 18 years and over and women at risk for fractures or osteoporosis should discuss screening with their health care providers. Ask your health care provider whether you should take a calcium supplement or vitamin D to reduce the rate of osteoporosis.  Menopause can be associated with physical symptoms and risks. Hormone replacement therapy is available to decrease symptoms and risks. You should talk to your health care provider about whether hormone replacement therapy is right for you.  Use sunscreen. Apply sunscreen liberally and repeatedly throughout the day. You should seek shade when your shadow is shorter than you. Protect yourself by wearing long sleeves, pants, a wide-brimmed hat, and sunglasses year round, whenever you are outdoors.  Once a month, do a whole body skin exam, using a mirror to look at the skin on your back. Tell your health care provider of new moles, moles that have irregular borders, moles that are larger than a pencil eraser, or moles that have changed in shape or color.  Stay current with required vaccines  (immunizations).  Influenza vaccine. All adults should be immunized every year.  Tetanus, diphtheria, and acellular pertussis (Td, Tdap) vaccine. Pregnant women should receive 1 dose of Tdap vaccine during each pregnancy. The dose should be obtained regardless of the length of time since the last dose. Immunization is preferred during the 27th 36th week of gestation. An adult who has not previously received Tdap or who does not know her vaccine status should receive 1 dose of Tdap. This initial dose should be followed by tetanus and diphtheria toxoids (Td) booster doses every 10 years. Adults with an unknown or incomplete history of completing a 3-dose immunization series with Td-containing vaccines should begin or complete a primary immunization series including a Tdap dose. Adults should receive a Td booster every 10 years.  Varicella vaccine. An adult without evidence of immunity to varicella should receive 2 doses or a second dose if she has previously received 1 dose. Pregnant females who do not have evidence of immunity should receive the first dose after pregnancy. This first dose should be obtained before leaving the health care facility. The second dose should be obtained 4 8 weeks after the first dose.  Human papillomavirus (HPV) vaccine. Females aged 9 26 years who have not received the vaccine previously should obtain the 3-dose series. The vaccine is not recommended for use in pregnant females. However, pregnancy testing is not needed before receiving a dose. If a female is found to be pregnant after receiving a dose, no treatment is needed. In that case, the remaining doses should be delayed until after the pregnancy. Immunization is recommended for any person with an immunocompromised condition through the age of 51 years if she did not get any or all doses earlier. During the 3-dose series, the second dose should be obtained 4 8 weeks after the first dose. The third dose should be obtained  24 weeks after the first dose and 16 weeks after the second dose.  Zoster vaccine. One dose is recommended for adults aged 57 years or older unless certain  conditions are present.  Measles, mumps, and rubella (MMR) vaccine. Adults born before [birth year] generally are considered immune to measles and mumps. Adults born in [birth year] or later should have 1 or more doses of MMR vaccine unless there is a contraindication to the vaccine or there is laboratory evidence of immunity to each of the three diseases. A routine second dose of MMR vaccine should be obtained at least 28 days after the first dose for students attending postsecondary schools, health care workers, or international travelers. People who received inactivated measles vaccine or an unknown type of measles vaccine during 1963 1967 should receive 2 doses of MMR vaccine. People who received inactivated mumps vaccine or an unknown type of mumps vaccine before 1979 and are at high risk for mumps infection should consider immunization with 2 doses of MMR vaccine. For females of childbearing age, rubella immunity should be determined. If there is no evidence of immunity, females who are not pregnant should be vaccinated. If there is no evidence of immunity, females who are pregnant should delay immunization until after pregnancy. Unvaccinated health care workers born before [birth year] who lack laboratory evidence of measles, mumps, or rubella immunity or laboratory confirmation of disease should consider measles and mumps immunization with 2 doses of MMR vaccine or rubella immunization with 1 dose of MMR vaccine.  Pneumococcal 13-valent conjugate (PCV13) vaccine. When indicated, a person who is uncertain of her immunization history and has no record of immunization should receive the PCV13 vaccine. An adult aged 42 years or older who has certain medical conditions and has not been previously immunized should receive 1 dose of PCV13 vaccine. This PCV13 should be followed  with a dose of pneumococcal polysaccharide (PPSV23) vaccine. The PPSV23 vaccine dose should be obtained at least 8 weeks after the dose of PCV13 vaccine. An adult aged 4 years or older who has certain medical conditions and previously received 1 or more doses of PPSV23 vaccine should receive 1 dose of PCV13. The PCV13 vaccine dose should be obtained 1 or more years after the last PPSV23 vaccine dose.  Pneumococcal polysaccharide (PPSV23) vaccine. When PCV13 is also indicated, PCV13 should be obtained first. All adults aged 27 years and older should be immunized. An adult younger than age 33 years who has certain medical conditions should be immunized. Any person who resides in a nursing home or long-term care facility should be immunized. An adult smoker should be immunized. People with an immunocompromised condition and certain other conditions should receive both PCV13 and PPSV23 vaccines. People with human immunodeficiency virus (HIV) infection should be immunized as soon as possible after diagnosis. Immunization during chemotherapy or radiation therapy should be avoided. Routine use of PPSV23 vaccine is not recommended for American Indians, [place] Natives, or people younger than 65 years unless there are medical conditions that require PPSV23 vaccine. When indicated, people who have unknown immunization and have no record of immunization should receive PPSV23 vaccine. One-time revaccination 5 years after the first dose of PPSV23 is recommended for people aged 13 64 years who have chronic kidney failure, nephrotic syndrome, asplenia, or immunocompromised conditions. People who received 1 2 doses of PPSV23 before age 66 years should receive another dose of PPSV23 vaccine at age 27 years or later if at least 5 years have passed since the previous dose. Doses of PPSV23 are not needed for people immunized with PPSV23 at or after age 33 years.  Meningococcal vaccine. Adults with asplenia or persistent complement  component deficiencies should receive 2  doses of quadrivalent meningococcal conjugate (MenACWY-D) vaccine. The doses should be obtained at least 2 months apart. Microbiologists working with certain meningococcal bacteria, [place] recruits, people at risk during an outbreak, and people who travel to or live in countries with a high rate of meningitis should be immunized. A first-year college student up through age 49 years who is living in a residence hall should receive a dose if she did not receive a dose on or after her 16th birthday. Adults who have certain high-risk conditions should receive one or more doses of vaccine.  Hepatitis A vaccine. Adults who wish to be protected from this disease, have certain high-risk conditions, work with hepatitis A-infected animals, work in hepatitis A research labs, or travel to or work in countries with a high rate of hepatitis A should be immunized. Adults who were previously unvaccinated and who anticipate close contact with an international adoptee during the first 60 days after arrival in the [place] from a country with a high rate of hepatitis A should be immunized.  Hepatitis B vaccine. Adults who wish to be protected from this disease, have certain high-risk conditions, may be exposed to blood or other infectious body fluids, are household contacts or sex partners of hepatitis B positive people, are clients or workers in certain care facilities, or travel to or work in countries with a high rate of hepatitis B should be immunized.  Haemophilus influenzae type b (Hib) vaccine. A previously unvaccinated person with asplenia or sickle cell disease or having a scheduled splenectomy should receive 1 dose of Hib vaccine. Regardless of previous immunization, a recipient of a hematopoietic stem cell transplant should receive a 3-dose series 6 12 months after her successful transplant. Hib vaccine is not recommended for adults with HIV infection. Preventive  Services / Frequency Ages 24 to 39years  Blood pressure check.** / Every 1 to 2 years.  Lipid and cholesterol check.** / Every 5 years beginning at age 66.  Clinical breast exam.** / Every 3 years for women in their 12s and 24s.  BRCA-related cancer risk assessment.** / For women who have family members with a BRCA-related cancer (breast, ovarian, tubal, or peritoneal cancers).  Pap test.** / Every 2 years from ages 31 through 69. Every 3 years starting at age 64 through age 76 or 89 with a history of 3 consecutive normal Pap tests.  HPV screening.** / Every 3 years from ages 10 through ages 10 to 96 with a history of 3 consecutive normal Pap tests.  Hepatitis C blood test.** / For any individual with known risks for hepatitis C.  Skin self-exam. / Monthly.  Influenza vaccine. / Every year.  Tetanus, diphtheria, and acellular pertussis (Tdap, Td) vaccine.** / Consult your health care provider. Pregnant women should receive 1 dose of Tdap vaccine during each pregnancy. 1 dose of Td every 10 years.  Varicella vaccine.** / Consult your health care provider. Pregnant females who do not have evidence of immunity should receive the first dose after pregnancy.  HPV vaccine. / 3 doses over 6 months, if 90 and younger. The vaccine is not recommended for use in pregnant females. However, pregnancy testing is not needed before receiving a dose.  Measles, mumps, rubella (MMR) vaccine.** / You need at least 1 dose of MMR if you were born in 1957 or later. You may also need a 2nd dose. For females of childbearing age, rubella immunity should be determined. If there is no evidence of immunity, females who are not  pregnant should be vaccinated. If there is no evidence of immunity, females who are pregnant should delay immunization until after pregnancy.  Pneumococcal 13-valent conjugate (PCV13) vaccine.** / Consult your health care provider.  Pneumococcal polysaccharide (PPSV23) vaccine.** / 1 to 2  doses if you smoke cigarettes or if you have certain conditions.  Meningococcal vaccine.** / 1 dose if you are age 88 to 6 years and a Market researcher living in a residence hall, or have one of several medical conditions, you need to get vaccinated against meningococcal disease. You may also need additional booster doses.  Hepatitis A vaccine.** / Consult your health care provider.  Hepatitis B vaccine.** / Consult your health care provider.  Haemophilus influenzae type b (Hib) vaccine.** / Consult your health care provider. Ages 23 to 64years  Blood pressure check.** / Every 1 to 2 years.  Lipid and cholesterol check.** / Every 5 years beginning at age 20 years.  Lung cancer screening. / Every year if you are aged 51 80 years and have a 30-pack-year history of smoking and currently smoke or have quit within the past 15 years. Yearly screening is stopped once you have quit smoking for at least 15 years or develop a health problem that would prevent you from having lung cancer treatment.  Clinical breast exam.** / Every year after age 8 years.  BRCA-related cancer risk assessment.** / For women who have family members with a BRCA-related cancer (breast, ovarian, tubal, or peritoneal cancers).  Mammogram.** / Every year beginning at age 10 years and continuing for as long as you are in good health. Consult with your health care provider.  Pap test.** / Every 3 years starting at age 30 years through age 5 or 61 years with a history of 3 consecutive normal Pap tests.  HPV screening.** / Every 3 years from ages 39 years through ages 72 to 19 years with a history of 3 consecutive normal Pap tests.  Fecal occult blood test (FOBT) of stool. / Every year beginning at age 59 years and continuing until age 27 years. You may not need to do this test if you get a colonoscopy every 10 years.  Flexible sigmoidoscopy or colonoscopy.** / Every 5 years for a flexible sigmoidoscopy or every  10 years for a colonoscopy beginning at age 110 years and continuing until age 63 years.  Hepatitis C blood test.** / For all people born from [birth year] through 1966 and any individual with known risks for hepatitis C.  Skin self-exam. / Monthly.  Influenza vaccine. / Every year.  Tetanus, diphtheria, and acellular pertussis (Tdap/Td) vaccine.** / Consult your health care provider. Pregnant women should receive 1 dose of Tdap vaccine during each pregnancy. 1 dose of Td every 10 years.  Varicella vaccine.** / Consult your health care provider. Pregnant females who do not have evidence of immunity should receive the first dose after pregnancy.  Zoster vaccine.** / 1 dose for adults aged 46 years or older.  Measles, mumps, rubella (MMR) vaccine.** / You need at least 1 dose of MMR if you were born in 1958 or later. You may also need a 2nd dose. For females of childbearing age, rubella immunity should be determined. If there is no evidence of immunity, females who are not pregnant should be vaccinated. If there is no evidence of immunity, females who are pregnant should delay immunization until after pregnancy.  Pneumococcal 13-valent conjugate (PCV13) vaccine.** / Consult your health care provider.  Pneumococcal polysaccharide (PPSV23) vaccine.** / 1  to 2 doses if you smoke cigarettes or if you have certain conditions.  Meningococcal vaccine.** / Consult your health care provider.  Hepatitis A vaccine.** / Consult your health care provider.  Hepatitis B vaccine.** / Consult your health care provider.  Haemophilus influenzae type b (Hib) vaccine.** / Consult your health care provider. Ages 67 years and over  Blood pressure check.** / Every 1 to 2 years.  Lipid and cholesterol check.** / Every 5 years beginning at age 30 years.  Lung cancer screening. / Every year if you are aged 54 80 years and have a 30-pack-year history of smoking and currently smoke or have quit within the past 15 years.  Yearly screening is stopped once you have quit smoking for at least 15 years or develop a health problem that would prevent you from having lung cancer treatment.  Clinical breast exam.** / Every year after age 2 years.  BRCA-related cancer risk assessment.** / For women who have family members with a BRCA-related cancer (breast, ovarian, tubal, or peritoneal cancers).  Mammogram.** / Every year beginning at age 12 years and continuing for as long as you are in good health. Consult with your health care provider.  Pap test.** / Every 3 years starting at age 2 years through age 30 or 31 years with 3 consecutive normal Pap tests. Testing can be stopped between 65 and 70 years with 3 consecutive normal Pap tests and no abnormal Pap or HPV tests in the past 10 years.  HPV screening.** / Every 3 years from ages 30 years through ages 78 or 49 years with a history of 3 consecutive normal Pap tests. Testing can be stopped between 65 and 70 years with 3 consecutive normal Pap tests and no abnormal Pap or HPV tests in the past 10 years.  Fecal occult blood test (FOBT) of stool. / Every year beginning at age 54 years and continuing until age 55 years. You may not need to do this test if you get a colonoscopy every 10 years.  Flexible sigmoidoscopy or colonoscopy.** / Every 5 years for a flexible sigmoidoscopy or every 10 years for a colonoscopy beginning at age 107 years and continuing until age 36 years.  Hepatitis C blood test.** / For all people born from [birth year] through 1966 and any individual with known risks for hepatitis C.  Osteoporosis screening.** / A one-time screening for women ages 45 years and over and women at risk for fractures or osteoporosis.  Skin self-exam. / Monthly.  Influenza vaccine. / Every year.  Tetanus, diphtheria, and acellular pertussis (Tdap/Td) vaccine.** / 1 dose of Td every 10 years.  Varicella vaccine.** / Consult your health care provider.  Zoster vaccine.** / 1  dose for adults aged 31 years or older.  Pneumococcal 13-valent conjugate (PCV13) vaccine.** / Consult your health care provider.  Pneumococcal polysaccharide (PPSV23) vaccine.** / 1 dose for all adults aged 46 years and older.  Meningococcal vaccine.** / Consult your health care provider.  Hepatitis A vaccine.** / Consult your health care provider.  Hepatitis B vaccine.** / Consult your health care provider.  Haemophilus influenzae type b (Hib) vaccine.** / Consult your health care provider. ** Family history and personal history of risk and conditions may change your health care provider's recommendations. Document Released: 10/10/2001 Document Revised: 06/04/2013 Document Reviewed: 01/09/2011 [facility] Patient Information 2014 [place], [place].

## 2013-10-28 NOTE — Telephone Encounter (Signed)
Relevant patient education mailed to patient.  

## 2013-10-28 NOTE — Progress Notes (Signed)
Pre-visit discussion using our clinic review tool. No additional management support is needed unless otherwise documented below in the visit note.  

## 2013-10-28 NOTE — Progress Notes (Signed)
Subjective:    Monica Copeland is a 73 y.o. female who presents for Medicare Annual/Subsequent preventive examination.  Preventive Screening-Counseling & Management  Tobacco History  Smoking status  . Current Some Day Smoker -- 0.75 packs/day  . Types: Cigarettes  Smokeless tobacco  . Never Used    Comment: Started smoking at age 27.form given 04-02-13     Problems Prior to Visit 1. None new  Current Problems (verified) Patient Active Problem List   Diagnosis Date Noted  . Plantar fasciitis, bilateral 05/08/2013  . Memory loss 04/10/2013  . 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 Renal insufficiency 12/12/2012  . Anxiety state, unspecified 11/29/2012  . Bronchitis with asthma, acute 10/26/2012  . Hip pain, bilateral 09/12/2012  . Hallucination 09/12/2012  . TIA (transient ischemic attack) 04/25/2012  . Hemiplegia, unspecified, affecting nondominant side 04/25/2012  . 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 Gastric ulcer with hemorrhage 01/24/2012  . Candida esophagitis 01/24/2012  . Esophageal candidiasis 01/24/2012  . Pyloric ulcer 01/24/2012  . Blood in stool 01/23/2012  . Acute posthemorrhagic anemia 01/23/2012  . Dysphagia, unspecified 01/23/2012  . Tachycardia 01/21/2012  . Chronic back pain 01/21/2012  . H/O brain surgery 01/21/2012  . Hyperlipidemia 01/21/2012    Medications Prior to Visit Current Outpatient Prescriptions on File Prior to Visit  Medication Sig Dispense Refill  . alendronate (FOSAMAX) 70 MG tablet Take 1 tablet (70 mg total) by mouth every 7 (seven) days. Take with a full glass of water on an empty stomach.  4 tablet  11  . ALPRAZolam (XANAX) 0.5 MG tablet Take 1 tablet (0.5 mg total) by mouth at bedtime as needed for sleep.  60 tablet  0  . atorvastatin (LIPITOR) 20 MG tablet Take 1 tablet (20 mg total) by mouth daily.  30 tablet  2  . budesonide-formoterol (SYMBICORT) 80-4.5 MCG/ACT inhaler Inhale 2 puffs into the  lungs as needed.      . cyclobenzaprine (FLEXERIL) 10 MG tablet TAKE 1 TABLET (10 MG TOTAL) BY MOUTH 3 (THREE) TIMES DAILY AS NEEDED FOR MUSCLE SPASMS.  60 tablet  2  . ferrous sulfate 325 (65 FE) MG tablet Take 1 tablet (325 mg total) by mouth 2 (two) times daily before lunch and supper.  60 tablet  11  . FLUoxetine (PROZAC) 20 MG tablet TAKE 1 TABLET BY MOUTH DAILY.  30 tablet  5  . levothyroxine (SYNTHROID, LEVOTHROID) 50 MCG tablet Take 1 tablet (50 mcg total) by mouth daily before breakfast.  30 tablet  5  . pantoprazole (PROTONIX) 40 MG tablet       . potassium chloride SA (K-DUR,KLOR-CON) 20 MEQ tablet Take 1 tablet (20 mEq total) by mouth daily.  30 tablet  2  . pramipexole (MIRAPEX) 1 MG tablet Take 1 tablet (1 mg total) by mouth at bedtime.  30 tablet  11  . predniSONE (DELTASONE) 5 MG tablet       . PROAIR HFA 108 (90 BASE) MCG/ACT inhaler as needed.       . traMADol (ULTRAM) 50 MG tablet Take 1 tablet (50 mg total) by mouth every 6 (six) hours as needed for pain.  30 tablet  0   No current facility-administered medications on file prior to visit.    Current Medications (verified) Current Outpatient Prescriptions  Medication Sig Dispense Refill  . alendronate (FOSAMAX) 70 MG tablet Take 1 tablet (70 mg total) by mouth every 7 (seven) days. Take with a full glass of  water on an empty stomach.  4 tablet  11  . ALPRAZolam (XANAX) 0.5 MG tablet Take 1 tablet (0.5 mg total) by mouth at bedtime as needed for sleep.  60 tablet  0  . atorvastatin (LIPITOR) 20 MG tablet Take 1 tablet (20 mg total) by mouth daily.  30 tablet  2  . budesonide-formoterol (SYMBICORT) 80-4.5 MCG/ACT inhaler Inhale 2 puffs into the lungs as needed.      . cyclobenzaprine (FLEXERIL) 10 MG tablet TAKE 1 TABLET (10 MG TOTAL) BY MOUTH 3 (THREE) TIMES DAILY AS NEEDED FOR MUSCLE SPASMS.  60 tablet  2  . ferrous sulfate 325 (65 FE) MG tablet Take 1 tablet (325 mg total) by mouth 2 (two) times daily before lunch and  supper.  60 tablet  11  . FLUoxetine (PROZAC) 20 MG tablet TAKE 1 TABLET BY MOUTH DAILY.  30 tablet  5  . levothyroxine (SYNTHROID, LEVOTHROID) 50 MCG tablet Take 1 tablet (50 mcg total) by mouth daily before breakfast.  30 tablet  5  . pantoprazole (PROTONIX) 40 MG tablet       . potassium chloride SA (K-DUR,KLOR-CON) 20 MEQ tablet Take 1 tablet (20 mEq total) by mouth daily.  30 tablet  2  . pramipexole (MIRAPEX) 1 MG tablet Take 1 tablet (1 mg total) by mouth at bedtime.  30 tablet  11  . predniSONE (DELTASONE) 5 MG tablet       . PROAIR HFA 108 (90 BASE) MCG/ACT inhaler as needed.       . traMADol (ULTRAM) 50 MG tablet Take 1 tablet (50 mg total) by mouth every 6 (six) hours as needed for pain.  30 tablet  0   No current facility-administered medications for this visit.     Allergies (verified) Tomato   PAST HISTORY  Family History Family History  Problem Relation Age of Onset  . Hypertension Mother   . Hypertension Brother   . Heart attack Mother   . Diabetes Father   . Asthma Father   . Asthma Son   . Lung cancer Father   . Esophageal cancer Brother   . Liver cancer Brother     mets from esophagus  . Colon cancer Neg Hx     Social History History  Substance Use Topics  . Smoking status: Current Some Day Smoker -- 0.75 packs/day    Types: Cigarettes  . Smokeless tobacco: Never Used     Comment: Started smoking at age 24.form given 04-02-13  . Alcohol Use: No     Are there smokers in your home (other than you)? No  Risk Factors Current exercise habits: walks alot   Dietary issues discussed: na   Cardiac risk factors: advanced age (older than 24 for men, 42 for women) and dyslipidemia.  Depression Screen (Note: if answer to either of the following is "Yes", a more complete depression screening is indicated)   Over the past two weeks, have you felt down, depressed or hopeless? No  Over the past two weeks, have you felt little interest or pleasure in doing  things? No  Have you lost interest or pleasure in daily life? No  Do you often feel hopeless? No  Do you cry easily over simple problems? No  Activities of Daily Living In your present state of health, do you have any difficulty performing the following activities?:  Driving? Yes Managing money?  No Feeding yourself? No Getting from bed to chair? No Climbing a flight of stairs? No  Preparing food and eating?: No Bathing or showering? No Getting dressed: No Getting to the toilet? No Using the toilet:No Moving around from place to place: No In the past year have you fallen or had a near fall?:No   Are you sexually active?  No  Do you have more than one partner?  No  Hearing Difficulties: yes Do you often ask people to speak up or repeat themselves? Yes Do you experience ringing or noises in your ears? No Do you have difficulty understanding soft or whispered voices? Yes   Do you feel that you have a problem with memory? No  Do you often misplace items? No  Do you feel safe at home?  Yes  Cognitive Testing  Alert? Yes  Normal Appearance?Yes  Oriented to person? Yes  Place? Yes   Time? Yes  Recall of three objects?  Yes  Can perform simple calculations? Yes  Displays appropriate judgment?Yes  Can read the correct time from a watch face?Yes   Advanced Directives have been discussed with the patient? Yes  List the Names of Other Physician/Practitioners you currently use: 1.  opth--[facility] 2. GI-- [facility]   Indicate any recent Medical Services you may have received from other than [facility] providers in the past year (date may be approximate).  Immunization History  Administered Date(s) Administered  . Influenza Whole 07/01/2012  . Influenza,inj,Quad PF,36+ Mos 05/08/2013    Screening Tests Health Maintenance  Topic Date Due  . Tetanus/tdap  08/06/1959  . Mammogram  08/22/2013  . Influenza Vaccine  03/28/2014  . Colonoscopy  04/18/2023  . Pneumococcal Polysaccharide  Vaccine Age 64 And Over  Completed  . Zostavax  Addressed    All answers were reviewed with the patient and necessary referrals were made:  Garnet Koyanagi, DO   10/28/2013   History reviewed:  She  has a past medical history of Hypertension; Hyperlipemia; Asthma; Diabetes mellitus without complication; Asthma; Back pain; Seizures; Anxiety; Cushing disease; GERD (gastroesophageal reflux disease) (01/24/2012); Emphysema of lung; Hyperlipidemia; Ulcer; and Hemorrhoid. She  does not have any pertinent problems on file. She  has past surgical history that includes Brain surgery (1987); Colon surgery; Abdominal hysterectomy; Appendectomy; candida esophagitis; and pylorus ulcer. Her family history includes Asthma in her father and son; Diabetes in her father; Esophageal cancer in her brother; Heart attack in her mother; Hypertension in her brother and mother; Liver cancer in her brother; Lung cancer in her father. There is no history of Colon cancer. She  reports that she has been smoking Cigarettes.  She has been smoking about 0.75 packs per day. She has never used smokeless tobacco. She reports that she does not drink alcohol or use illicit drugs. She has a current medication list which includes the following prescription(s): alendronate, alprazolam, atorvastatin, budesonide-formoterol, cyclobenzaprine, ferrous sulfate, fluoxetine, levothyroxine, pantoprazole, potassium chloride sa, pramipexole, prednisone, proair hfa, and tramadol. Current Outpatient Prescriptions on File Prior to Visit  Medication Sig Dispense Refill  . alendronate (FOSAMAX) 70 MG tablet Take 1 tablet (70 mg total) by mouth every 7 (seven) days. Take with a full glass of water on an empty stomach.  4 tablet  11  . ALPRAZolam (XANAX) 0.5 MG tablet Take 1 tablet (0.5 mg total) by mouth at bedtime as needed for sleep.  60 tablet  0  . atorvastatin (LIPITOR) 20 MG tablet Take 1 tablet (20 mg total) by mouth daily.  30 tablet  2  .  budesonide-formoterol (SYMBICORT) 80-4.5 MCG/ACT inhaler Inhale 2 puffs  into the lungs as needed.      . cyclobenzaprine (FLEXERIL) 10 MG tablet TAKE 1 TABLET (10 MG TOTAL) BY MOUTH 3 (THREE) TIMES DAILY AS NEEDED FOR MUSCLE SPASMS.  60 tablet  2  . ferrous sulfate 325 (65 FE) MG tablet Take 1 tablet (325 mg total) by mouth 2 (two) times daily before lunch and supper.  60 tablet  11  . FLUoxetine (PROZAC) 20 MG tablet TAKE 1 TABLET BY MOUTH DAILY.  30 tablet  5  . levothyroxine (SYNTHROID, LEVOTHROID) 50 MCG tablet Take 1 tablet (50 mcg total) by mouth daily before breakfast.  30 tablet  5  . pantoprazole (PROTONIX) 40 MG tablet       . potassium chloride SA (K-DUR,KLOR-CON) 20 MEQ tablet Take 1 tablet (20 mEq total) by mouth daily.  30 tablet  2  . pramipexole (MIRAPEX) 1 MG tablet Take 1 tablet (1 mg total) by mouth at bedtime.  30 tablet  11  . predniSONE (DELTASONE) 5 MG tablet       . PROAIR HFA 108 (90 BASE) MCG/ACT inhaler as needed.       . traMADol (ULTRAM) 50 MG tablet Take 1 tablet (50 mg total) by mouth every 6 (six) hours as needed for pain.  30 tablet  0   No current facility-administered medications on file prior to visit.   She is allergic to tomato.  Review of Systems  Review of Systems  Constitutional: Negative for activity change, appetite change and fatigue.  HENT: Negative for hearing loss, congestion, tinnitus and ear discharge.   Eyes: Negative for visual disturbance (see optho -- 3 years ago-due) Respiratory: Negative for cough, chest tightness and shortness of breath.   Cardiovascular: Negative for chest pain, palpitations and leg swelling.  Gastrointestinal: Negative for abdominal pain, diarrhea, constipation and abdominal distention.  Genitourinary: Negative for urgency, frequency, decreased urine volume and difficulty urinating.  Musculoskeletal: Negative for back pain, arthralgias and gait problem.  Skin: Negative for color change, pallor and rash.   Neurological: Negative for dizziness, light-headedness, numbness and headaches.  Hematological: Negative for adenopathy. Does not bruise/bleed easily.  Psychiatric/Behavioral: Negative for suicidal ideas, confusion, sleep disturbance, self-injury, dysphoric mood, decreased concentration and agitation.  Pt is able to read and write and can do all ADLs No risk for falling No abuse/ violence in home     Objective:     Vision by Snellen chart: right OVZ:CHYI-- pt to make appointment  Body mass index is 28.06 kg/(m^2). BP 118/70  Pulse 72  Temp(Src) 98.6 F (37 C) (Oral)  Ht 4\' 11"  (1.499 m)  Wt 139 lb (63.05 kg)  BMI 28.06 kg/m2  SpO2 99%  BP 118/70  Pulse 72  Temp(Src) 98.6 F (37 C) (Oral)  Ht 4\' 11"  (1.499 m)  Wt 139 lb (63.05 kg)  BMI 28.06 kg/m2  SpO2 99% General appearance: alert, cooperative, appears stated age and no distress Head: Normocephalic, without obvious abnormality, atraumatic Eyes: conjunctivae/corneas clear. PERRL, EOM's intact. Fundi benign. Ears: normal TM's and external ear canals both ears Nose: Nares normal. Septum midline. Mucosa normal. No drainage or sinus tenderness. Throat: lips, mucosa, and tongue normal; teeth and gums normal Neck: no adenopathy, no carotid bruit, no JVD, supple, symmetrical, trachea midline and thyroid not enlarged, symmetric, no tenderness/mass/nodules Back: symmetric, no curvature. ROM normal. No CVA tenderness. Lungs: clear to auscultation bilaterally Breasts: normal appearance, no masses or tenderness Heart: S1, S2 normal Abdomen: soft, non-tender; bowel sounds normal; no masses,  no organomegaly Pelvic: pt preferred not to do Extremities: extremities normal, atraumatic, no cyanosis or edema Pulses: 2+ and symmetric Skin: Skin color, texture, turgor normal. No rashes or lesions Lymph nodes: Cervical, supraclavicular, and axillary nodes normal. Neurologic: Alert and oriented X 3, normal strength and tone. Normal  symmetric reflexes. Normal coordination and gait Psych-- no depression, no anxiety      Assessment:     cpe      Plan:     During the course of the visit the patient was educated and counseled about appropriate screening and preventive services including:    Pneumococcal vaccine   Influenza vaccine  Td vaccine  Screening mammography  Screening Pap smear and pelvic exam   Bone densitometry screening  Colorectal cancer screening  Diabetes screening  Glaucoma screening  Diet review for nutrition referral? Yes ____  Not Indicated __x__   Patient Instructions (the written plan) was given to the patient.  Medicare Attestation I have personally reviewed: The patient's medical and social history Their use of alcohol, tobacco or illicit drugs Their current medications and supplements The patient's functional ability including ADLs,fall risks, home safety risks, cognitive, and hearing and visual impairment Diet and physical activities Evidence for depression or mood disorders  The patient's weight, height, BMI, and visual acuity have been recorded in the chart.  I have made referrals, counseling, and provided education to the patient based on review of the above and I have provided the patient with a written personalized care plan for preventive services.    1. Preventative health care   2. Other and unspecified hyperlipidemia Check labs, con't meds  3. Generalized anxiety disorder Refill med--stable - ALPRAZolam (XANAX) 0.5 MG tablet; Take 1 tablet (0.5 mg total) by mouth at bedtime as needed for sleep.  Dispense: 60 tablet; Refill: 0  4. COPD (chronic obstructive pulmonary disease) stable  5. Tobacco use disorder Per pt she is trying to cut down   Garnet Koyanagi, DO   10/28/2013

## 2013-10-29 ENCOUNTER — Telehealth: Payer: Self-pay

## 2013-10-29 ENCOUNTER — Other Ambulatory Visit: Payer: Self-pay

## 2013-10-29 ENCOUNTER — Telehealth: Payer: Self-pay | Admitting: Family Medicine

## 2013-10-29 DIAGNOSIS — Z1231 Encounter for screening mammogram for malignant neoplasm of breast: Secondary | ICD-10-CM

## 2013-10-29 NOTE — Telephone Encounter (Signed)
Patient advised.

## 2013-10-29 NOTE — Telephone Encounter (Signed)
Message copied by Reino Bellis on Wed Oct 29, 2013  4:51 PM ------      Message from: Rosalita Chessman      Created: Tue Oct 28, 2013  6:06 PM       stable ------

## 2013-10-29 NOTE — Telephone Encounter (Signed)
Relevant patient education mailed to patient.  

## 2013-11-07 ENCOUNTER — Other Ambulatory Visit: Payer: Self-pay | Admitting: Family Medicine

## 2013-11-12 ENCOUNTER — Ambulatory Visit
Admission: RE | Admit: 2013-11-12 | Discharge: 2013-11-12 | Disposition: A | Discharge status: Home / Self Care | Payer: Medicare Other | Source: Ambulatory Visit

## 2013-11-12 ENCOUNTER — Other Ambulatory Visit: Payer: Self-pay | Admitting: Family Medicine

## 2013-11-12 DIAGNOSIS — Z1231 Encounter for screening mammogram for malignant neoplasm of breast: Secondary | ICD-10-CM

## 2013-11-12 DIAGNOSIS — N644 Mastodynia: Secondary | ICD-10-CM

## 2013-11-24 ENCOUNTER — Other Ambulatory Visit: Payer: Self-pay | Admitting: Endocrinology

## 2013-11-24 ENCOUNTER — Other Ambulatory Visit: Payer: Self-pay | Admitting: Family Medicine

## 2013-11-24 NOTE — Telephone Encounter (Signed)
Please rf x 1 Ov is due

## 2013-11-26 ENCOUNTER — Other Ambulatory Visit: Payer: Medicare Other

## 2013-11-26 ENCOUNTER — Telehealth: Payer: Self-pay

## 2013-11-26 DIAGNOSIS — F411 Generalized anxiety disorder: Secondary | ICD-10-CM

## 2013-11-26 LAB — HM MAMMOGRAPHY: HM MAMMO: NORMAL

## 2013-11-26 MED ORDER — ALPRAZOLAM 0.5 MG PO TABS: 0.5000 mg | ORAL_TABLET | Freq: Every evening | ORAL | Status: DC | PRN

## 2013-11-26 NOTE — Telephone Encounter (Signed)
Rx faxed.    KP 

## 2013-11-26 NOTE — Telephone Encounter (Signed)
Last seen and filled 10/28/13 #60. UDS 04/10/13 Low risk  Please advise      KP

## 2013-11-26 NOTE — Telephone Encounter (Signed)
Ok to refill 

## 2013-12-22 ENCOUNTER — Other Ambulatory Visit: Payer: Self-pay | Admitting: Family Medicine

## 2013-12-23 ENCOUNTER — Telehealth: Payer: Self-pay | Admitting: Family Medicine

## 2013-12-23 NOTE — Telephone Encounter (Signed)
Spoke with patient who stated meds have already been refilled.

## 2013-12-23 NOTE — Telephone Encounter (Signed)
[name] name:Kilea Renda  Relation to WL:SLHTDSK Call back [place], [place]   Reason for call: Patient called and requested a refill for Levan, [place]

## 2013-12-31 ENCOUNTER — Ambulatory Visit: Payer: Medicare Other | Admitting: Endocrinology

## 2014-01-23 ENCOUNTER — Ambulatory Visit: Payer: Medicare Other | Admitting: Endocrinology

## 2014-01-26 ENCOUNTER — Other Ambulatory Visit: Payer: Self-pay | Admitting: Family Medicine

## 2014-01-26 ENCOUNTER — Other Ambulatory Visit: Payer: Self-pay | Admitting: Endocrinology

## 2014-01-26 NOTE — Telephone Encounter (Signed)
Please advise if ok to refill. Thanks 

## 2014-02-02 ENCOUNTER — Encounter: Payer: Self-pay | Admitting: Endocrinology

## 2014-02-02 ENCOUNTER — Ambulatory Visit (INDEPENDENT_AMBULATORY_CARE_PROVIDER_SITE_OTHER): Payer: Medicare Other | Admitting: Endocrinology

## 2014-02-02 VITALS — BP 122/70 | HR 112 | Temp 98.3°F | Ht 59.0 in | Wt 144.0 lb

## 2014-02-02 DIAGNOSIS — E230 Hypopituitarism: Secondary | ICD-10-CM

## 2014-02-02 MED ORDER — PREDNISONE 5 MG PO TABS: ORAL_TABLET | ORAL | Status: DC

## 2014-02-02 NOTE — Progress Notes (Signed)
Subjective:    Patient ID: Monica Copeland, female    DOB: [birth year], 73 y.o.   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  Last MRI of the brain was in 2013, and made no mention of the pituitary).  Pt has little if any weakness of the muscle of all 4 extremities, or assoc polyuria.   Past Medical History  Diagnosis Date  . Hypertension   . Hyperlipemia   . Asthma   . Diabetes mellitus without complication   . Asthma   . Back pain   . Seizures   . Anxiety   . Cushing disease   . GERD (gastroesophageal reflux disease) 01/24/2012  . Emphysema of lung   . Hyperlipidemia   . Ulcer   . Hemorrhoid     Past Surgical History  Procedure Laterality Date  . 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 Colon surgery      colon perforation  . Appendectomy    . Candida esophagitis    . Pylorus ulcer    . [place]      [place]    History   Social History  . Marital Status: Widowed    Spouse Name: N/A    Number of Children: N/A  . Years of Education: N/A   Occupational History  . Not on file.   Social History Main Topics  . Smoking status: Current Some Day Smoker -- 0.75 packs/day    Types: Cigarettes  . Smokeless tobacco: Never Used     Comment: Started smoking at age 41.form given 04-02-13  . Alcohol Use: No  . Drug Use: No  . Sexual Activity: Not Currently   Other Topics Concern  . Not on file   Social History Narrative   ** Merged History Encounter **        Current Outpatient Prescriptions on File Prior to Visit  Medication Sig Dispense Refill  . alendronate (FOSAMAX) 70 MG tablet Take 1 tablet (70 mg total) by mouth every 7 (seven) days.  Take with a full glass of water on an empty stomach.  4 tablet  11  . ALPRAZolam (XANAX) 0.5 MG tablet Take 1 tablet (0.5 mg total) by mouth at bedtime as needed for sleep.  60 tablet  0  . budesonide-formoterol (SYMBICORT) 80-4.5 MCG/ACT inhaler Inhale 2 puffs into the lungs as needed.      . cyclobenzaprine (FLEXERIL) 10 MG tablet TAKE 1 TABLET (10 MG TOTAL) BY MOUTH 3 (THREE) TIMES DAILY AS NEEDED FOR MUSCLE SPASMS.  60 tablet  2  . ferrous sulfate 325 (65 FE) MG tablet Take 1 tablet (325 mg total) by mouth 2 (two) times daily before lunch and supper.  60 tablet  11  . FLUoxetine (PROZAC) 20 MG capsule TAKE 1 CAPSULE BY MOUTH DAILY.  30 capsule  5  . levothyroxine (SYNTHROID, LEVOTHROID) 50 MCG tablet Take 1 tablet (50 mcg total) by mouth daily before breakfast.  30 tablet  5  . pantoprazole (PROTONIX) 40 MG tablet       . potassium chloride SA (K-DUR,KLOR-CON) 20 MEQ tablet Take 1 tablet (20 mEq total) by mouth daily.  30 tablet  2  . pramipexole (MIRAPEX) 1 MG tablet Take 1 tablet (1 mg total) by mouth at bedtime.  30 tablet  11  . PROAIR HFA 108 (90 BASE) MCG/ACT inhaler as needed.       . traMADol (ULTRAM) 50 MG tablet Take 1 tablet (50 mg total) by mouth every 6 (six) hours as needed for pain.  30 tablet  0   No current facility-administered medications on file prior to visit.    Allergies  Allergen Reactions  . Tomato Rash    Family History  Problem Relation Age of Onset  . Hypertension Mother   . Hypertension Brother   . Heart attack Mother   . Diabetes Father   . Asthma Father   . Asthma Son   . Lung cancer Father   . Esophageal cancer Brother   . Liver cancer Brother     mets from esophagus  . Colon cancer Neg Hx     BP 122/70  Pulse 112  Temp(Src) 98.3 F (36.8 C) (Oral)  Ht 4\' 11"  (1.499 m)  Wt 144 lb (65.318 kg)  BMI 29.07 kg/m2  SpO2 94%       Review of Systems Denies headache.  She has lost a few lbs.     Objective:   Physical Exam VITAL SIGNS:   See vs page GENERAL: no distress NECK: There is no palpable thyroid enlargement.  No thyroid nodule is palpable.  No palpable lymphadenopathy at the anterior neck.  Gait: steady, but favors RLE (knee pain).        Assessment & Plan:  Pituitary insufficiency: she probably needs increased synthroid. Weight loss, new.  Very unlikely related to the pituitary, but we are checking TFT   Patient is advised the following: Patient Instructions  Please come back for a follow-up appointment in 3 months.   blood tests are being requested for you today.  We'll contact you with results.

## 2014-02-02 NOTE — Patient Instructions (Addendum)
Please come back for a follow-up appointment in 3 months.   blood tests are being requested for you today.  We'll contact you with results.

## 2014-02-03 ENCOUNTER — Other Ambulatory Visit: Payer: Self-pay | Admitting: Endocrinology

## 2014-02-03 LAB — TSH: TSH: 0.75 u[IU]/mL (ref 0.35–4.50)

## 2014-02-03 LAB — T4, FREE: FREE T4: 0.67 ng/dL (ref 0.60–1.60)

## 2014-02-03 MED ORDER — LEVOTHYROXINE SODIUM 75 MCG PO TABS: 75.0000 ug | ORAL_TABLET | Freq: Every day | ORAL | Status: DC

## 2014-02-13 ENCOUNTER — Ambulatory Visit: Payer: Medicare Other | Admitting: Family Medicine

## 2014-02-19 ENCOUNTER — Telehealth: Payer: Self-pay

## 2014-02-19 DIAGNOSIS — F411 Generalized anxiety disorder: Secondary | ICD-10-CM

## 2014-02-19 MED ORDER — ALPRAZOLAM 0.5 MG PO TABS: 0.5000 mg | ORAL_TABLET | Freq: Every evening | ORAL | Status: DC | PRN

## 2014-02-19 NOTE — Telephone Encounter (Signed)
Refill x1 

## 2014-02-19 NOTE — Telephone Encounter (Signed)
Rx faxed.    KP 

## 2014-02-19 NOTE — Telephone Encounter (Signed)
Last seen 10/28/13 and filled 11/26/13 #60. Please advise     KP

## 2014-03-02 ENCOUNTER — Other Ambulatory Visit: Payer: Self-pay | Admitting: Family Medicine

## 2014-03-09 ENCOUNTER — Ambulatory Visit: Payer: Medicare Other | Admitting: Family Medicine

## 2014-03-15 ENCOUNTER — Other Ambulatory Visit: Payer: Self-pay | Admitting: Family Medicine

## 2014-03-16 NOTE — Telephone Encounter (Signed)
Rx sent to the pharmacy by e-scrip.//AB/CMA

## 2014-03-30 ENCOUNTER — Ambulatory Visit: Payer: Medicare Other | Admitting: Family Medicine

## 2014-04-10 ENCOUNTER — Ambulatory Visit: Payer: Medicare Other | Admitting: Family Medicine

## 2014-04-13 ENCOUNTER — Other Ambulatory Visit: Payer: Self-pay

## 2014-04-13 MED ORDER — PREDNISONE 5 MG PO TABS: ORAL_TABLET | ORAL | Status: DC

## 2014-04-15 ENCOUNTER — Other Ambulatory Visit: Payer: Self-pay

## 2014-04-15 MED ORDER — ATORVASTATIN CALCIUM 20 MG PO TABS: ORAL_TABLET | ORAL | Status: DC

## 2014-04-15 MED ORDER — CYCLOBENZAPRINE HCL 10 MG PO TABS: ORAL_TABLET | ORAL | Status: DC

## 2014-04-17 ENCOUNTER — Telehealth: Payer: Self-pay | Admitting: Family Medicine

## 2014-04-17 DIAGNOSIS — F411 Generalized anxiety disorder: Secondary | ICD-10-CM

## 2014-04-17 NOTE — Telephone Encounter (Signed)
[name] name: Harmoni  Relation to pt: self  Call back number: [phone]   Reason for call:   pt states she lost 20lbs , hand shaking and appetite lost due to rx FLUoxetine (PROZAC) 20 MG capsule. In need of clinical advice

## 2014-04-17 NOTE — Telephone Encounter (Signed)
Please advise      KP 

## 2014-04-17 NOTE — Telephone Encounter (Signed)
We can lower dose to 10 mg

## 2014-04-20 MED ORDER — ALPRAZOLAM 0.5 MG PO TABS: 0.5000 mg | ORAL_TABLET | Freq: Every evening | ORAL | Status: DC | PRN

## 2014-04-20 MED ORDER — FLUOXETINE HCL 10 MG PO CAPS: 10.0000 mg | ORAL_CAPSULE | Freq: Every day | ORAL | Status: DC

## 2014-04-20 NOTE — Telephone Encounter (Signed)
Last seen 10/28/13 and filled 02/19/14 #60. Please advise     KP

## 2014-04-20 NOTE — Telephone Encounter (Signed)
Rx faxed.    KP 

## 2014-04-20 NOTE — Telephone Encounter (Signed)
1 po qd  #30  Ov in 1 month

## 2014-04-22 ENCOUNTER — Other Ambulatory Visit: Payer: Self-pay

## 2014-04-22 MED ORDER — PANTOPRAZOLE SODIUM 40 MG PO TBEC: 40.0000 mg | DELAYED_RELEASE_TABLET | Freq: Two times a day (BID) | ORAL | Status: DC

## 2014-05-01 ENCOUNTER — Ambulatory Visit: Payer: Medicare Other | Admitting: Family Medicine

## 2014-05-05 ENCOUNTER — Encounter: Payer: Self-pay | Admitting: Endocrinology

## 2014-05-05 ENCOUNTER — Ambulatory Visit: Payer: Medicare Other | Admitting: Endocrinology

## 2014-05-05 ENCOUNTER — Ambulatory Visit (INDEPENDENT_AMBULATORY_CARE_PROVIDER_SITE_OTHER): Payer: Medicare Other | Admitting: Endocrinology

## 2014-05-05 VITALS — BP 124/82 | HR 86 | Temp 97.9°F | Ht 59.0 in | Wt 130.0 lb

## 2014-05-05 DIAGNOSIS — E230 Hypopituitarism: Secondary | ICD-10-CM

## 2014-05-05 NOTE — Patient Instructions (Signed)
blood tests are being requested for you today.  We'll contact you with results. Please come back for a follow-up appointment in 6 months  

## 2014-05-05 NOTE — Progress Notes (Signed)
Subjective:    Patient ID: Monica Copeland, female    DOB: 21-Feb-1941, 73 y.o.   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  Last MRI of the brain was in 2013, and made no mention of the pituitary).  denies polyuria. Hypokalemia: denies cramps Renal insufficiency: she denies dysuria  Past Medical History  Diagnosis Date  . Hypertension   . Hyperlipemia   . Asthma   . Diabetes mellitus without complication   . Asthma   . Back pain   . Seizures   . Anxiety   . Cushing disease   . GERD (gastroesophageal reflux disease) 01/24/2012  . Emphysema of lung   . Hyperlipidemia   . Ulcer   . Hemorrhoid     Past Surgical History  Procedure Laterality Date  . 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 Colon surgery      colon perforation  . Appendectomy    . Candida esophagitis    . Pylorus ulcer    . [place]dominal hyste[place]      [place]    History   Social History  . Marital Status: Widowed    Spouse Name: N/A    Number of Children: N/A  . Years of Education: N/A   Occupational History  . Not on file.   Social History Main Topics  . Smoking status: Current Some Day Smoker -- 0.75 packs/day    Types: Cigarettes  . Smokeless tobacco: Never Used     Comment: Started smoking at age 89.form given 04-02-13  . Alcohol Use: No  . Drug Use: No  . Sexual Activity: Not Currently   Other Topics Concern  . Not on file   Social History Narrative   ** Merged History Encounter **        Current Outpatient Prescriptions on File Prior to Visit  Medication Sig  Dispense Refill  . alendronate (FOSAMAX) 70 MG tablet Take 1 tablet (70 mg total) by mouth every 7 (seven) days. Take with a full glass of water on an empty stomach.  4 tablet  11  . ALPRAZolam (XANAX) 0.5 MG tablet Take 1 tablet (0.5 mg total) by mouth at bedtime as needed for sleep.  60 tablet  0  . atorvastatin (LIPITOR) 20 MG tablet TAKE 1 TABLET BY MOUTH DAILY. DUE FOR LAB WORK NOW FOR MORE REFILLS  30 tablet  0  . budesonide-formoterol (SYMBICORT) 80-4.5 MCG/ACT inhaler Inhale 2 puffs into the lungs as needed.      . cyclobenzaprine (FLEXERIL) 10 MG tablet TAKE 1 TABLET (10 MG TOTAL) BY MOUTH 3 (THREE) TIMES DAILY AS NEEDED FOR MUSCLE SPASMS. No more med's until seen by provider  60 tablet  0  . ferrous sulfate 325 (65 FE) MG tablet TAKE 1 TABLET BY MOUTH TWICE DAILY BEFORE LUNCH AND SUPPER  60 tablet  1  . FLUoxetine (PROZAC) 10 MG capsule Take 1 capsule (10 mg total) by mouth daily.  30 capsule  3  . pantoprazole (PROTONIX) 40 MG tablet Take 1 tablet (40 mg total) by  mouth 2 (two) times daily.  60 tablet  1  . potassium chloride SA (K-DUR,KLOR-CON) 20 MEQ tablet Take 1 tablet (20 mEq total) by mouth daily.  30 tablet  2  . predniSONE (DELTASONE) 5 MG tablet TAKE 1 TABLET BY MOUTH ONCE DAILY.  30 tablet  11  . PROAIR HFA 108 (90 BASE) MCG/ACT inhaler as needed.       . traMADol (ULTRAM) 50 MG tablet Take 1 tablet (50 mg total) by mouth every 6 (six) hours as needed for pain.  30 tablet  0  . pramipexole (MIRAPEX) 1 MG tablet Take 1 tablet (1 mg total) by mouth at bedtime.  30 tablet  11   No current facility-administered medications on file prior to visit.    Allergies  Allergen Reactions  . Tomato Rash    Family History  Problem Relation Age of Onset  . Hypertension Mother   . Hypertension Brother   . Heart attack Mother   . Diabetes Father   . Asthma Father   . Asthma Son   . Lung cancer Father   . Esophageal cancer Brother   . Liver cancer Brother     mets from esophagus    . Colon cancer Neg Hx     BP 124/82  Pulse 86  Temp(Src) 97.9 F (36.6 C) (Oral)  Ht 4\' 11"  (1.499 m)  Wt 130 lb (58.968 kg)  BMI 26.24 kg/m2  SpO2 95%    Review of Systems Denies LOC.  She has lost a few lbs.    Objective:   Physical Exam VITAL SIGNS:  See vs page GENERAL: no distress Ext: no edema Gait: normal and steady  Lab Results  Component Value Date   CREATININE 1.1 05/05/2014   BUN 11 05/05/2014   NA 138 05/05/2014   K 4.5 05/05/2014   CL 103 05/05/2014   CO2 27 05/05/2014  l    Assessment & Plan:  Hypokalemia, well-replaced Renal insufficiency, resolved Pituitary insufficiency: well-replaced  Patient is advised the following: Patient Instructions  blood tests are being requested for you today.  We'll contact you with results. Please come back for a follow-up appointment in 6 months.

## 2014-05-06 ENCOUNTER — Telehealth: Payer: Self-pay | Admitting: Family Medicine

## 2014-05-06 LAB — BASIC METABOLIC PANEL
BUN: 11 mg/dL (ref 6–23)
CALCIUM: 9.1 mg/dL (ref 8.4–10.5)
CO2: 27 mEq/L (ref 19–32)
Chloride: 103 mEq/L (ref 96–112)
Creatinine, Ser: 1.1 mg/dL (ref 0.4–1.2)
GFR: 53.32 mL/min — AB (ref >60.00)
Glucose, Bld: 99 mg/dL (ref 70–99)
Potassium: 4.5 mEq/L (ref 3.5–5.1)
SODIUM: 138 meq/L (ref 135–145)

## 2014-05-06 LAB — T4, FREE: FREE T4: 1.5 ng/dL (ref 0.60–1.60)

## 2014-05-06 LAB — TSH: TSH: 0.04 u[IU]/mL — AB (ref 0.35–4.50)

## 2014-05-06 MED ORDER — LEVOTHYROXINE SODIUM 50 MCG PO TABS: 50.0000 ug | ORAL_TABLET | Freq: Every day | ORAL | Status: DC

## 2014-05-06 NOTE — Telephone Encounter (Signed)
Has some questions regarding meds. Pt states she was taking off some. Please advise. You may leave vm on daughters phone with which meds she should be taking.

## 2014-05-06 NOTE — Telephone Encounter (Signed)
Please disregard previous message, daughter states she had the wrong doctor

## 2014-05-15 ENCOUNTER — Other Ambulatory Visit: Payer: Self-pay | Admitting: Family Medicine

## 2014-05-28 ENCOUNTER — Encounter: Payer: Self-pay | Admitting: Family Medicine

## 2014-05-28 ENCOUNTER — Ambulatory Visit (INDEPENDENT_AMBULATORY_CARE_PROVIDER_SITE_OTHER): Payer: Medicare Other | Admitting: Family Medicine

## 2014-05-28 VITALS — BP 129/81 | HR 80 | Temp 97.6°F | Wt 129.4 lb

## 2014-05-28 DIAGNOSIS — F411 Generalized anxiety disorder: Secondary | ICD-10-CM

## 2014-05-28 DIAGNOSIS — E785 Hyperlipidemia, unspecified: Secondary | ICD-10-CM

## 2014-05-28 DIAGNOSIS — Z23 Encounter for immunization: Secondary | ICD-10-CM

## 2014-05-28 MED ORDER — ALPRAZOLAM 0.5 MG PO TABS: 0.5000 mg | ORAL_TABLET | Freq: Two times a day (BID) | ORAL | Status: DC | PRN

## 2014-05-28 NOTE — Patient Instructions (Signed)

## 2014-05-28 NOTE — Progress Notes (Signed)
   Subjective:    Patient ID: Monica Copeland, female    DOB: Apr 06, 1941, 73 y.o.   MRN: [id number]  HPI Pt here c/o inc trouble sleep and anxiety and she is requesting to increase to bid with xanax.  She also needs to have her lipids checked.   No other complaints.  Pt has some back pain but it comes and goes and if feeling good today.   Review of Systems    as above Objective:   Physical Exam  BP 129/81  Pulse 80  Temp(Src) 97.6 F (36.4 C) (Oral)  Wt 129 lb 6.4 oz (58.695 kg)  SpO2 97% General appearance: alert, cooperative, appears stated age and no distress Neck: no adenopathy, supple, symmetrical, trachea midline and thyroid not enlarged, symmetric, no tenderness/mass/nodules Lungs: clear to auscultation bilaterally Heart: regular rate and rhythm, S1, S2 normal, no murmur, click, rub or gallop Extremities: extremities normal, atraumatic, no cyanosis or edema Neurologic: Alert and oriented X 3, normal strength and tone. Normal symmetric reflexes. Normal coordination and gait      Assessment & Plan:  1. Generalized anxiety disorder Pt asked to increase to bid for sleep and anxiety - ALPRAZolam (XANAX) 0.5 MG tablet; Take 1 tablet (0.5 mg total) by mouth 2 (two) times daily as needed for sleep.  Dispense: 60 tablet; Refill: 2  2. Hyperlipidemia Check labs, con't meds - Basic metabolic panel - Hepatic function panel - Lipid panel  3. Need for pneumococcal vaccination   - Pneumococcal polysaccharide vaccine 23-valent greater than or equal to 2yo subcutaneous/IM

## 2014-05-28 NOTE — Progress Notes (Signed)
Pre visit review using our clinic review tool, if applicable. No additional management support is needed unless otherwise documented below in the visit note. 

## 2014-05-29 LAB — BASIC METABOLIC PANEL
BUN: 18 mg/dL (ref 6–23)
CO2: 29 mEq/L (ref 19–32)
Calcium: 8.8 mg/dL (ref 8.4–10.5)
Chloride: 99 mEq/L (ref 96–112)
Creatinine, Ser: 1.1 mg/dL (ref 0.4–1.2)
GFR: 52.74 mL/min — ABNORMAL LOW (ref >60.00)
Glucose, Bld: 83 mg/dL (ref 70–99)
POTASSIUM: 3.6 meq/L (ref 3.5–5.1)
Sodium: 136 mEq/L (ref 135–145)

## 2014-05-29 LAB — HEPATIC FUNCTION PANEL
ALT: 17 U/L (ref 0–35)
AST: 19 U/L (ref 0–37)
Albumin: 3.8 g/dL (ref 3.5–5.2)
Alkaline Phosphatase: 90 U/L (ref 39–117)
BILIRUBIN DIRECT: 0.2 mg/dL (ref 0.0–0.3)
BILIRUBIN TOTAL: 0.7 mg/dL (ref 0.2–1.2)
Total Protein: 7.4 g/dL (ref 6.0–8.3)

## 2014-05-29 LAB — LIPID PANEL
CHOL/HDL RATIO: 5
Cholesterol: 208 mg/dL — ABNORMAL HIGH (ref 0–200)
HDL: 42.1 mg/dL (ref >39.00)
NonHDL: 165.9
TRIGLYCERIDES: 230 mg/dL — AB (ref 0.0–149.0)
VLDL: 46 mg/dL — AB (ref 0.0–40.0)

## 2014-05-29 LAB — LDL CHOLESTEROL, DIRECT: Direct LDL: 128.6 mg/dL

## 2014-06-05 MED ORDER — ATORVASTATIN CALCIUM 40 MG PO TABS: ORAL_TABLET | ORAL | Status: DC

## 2014-06-20 ENCOUNTER — Other Ambulatory Visit: Payer: Self-pay | Admitting: Family Medicine

## 2014-06-21 ENCOUNTER — Other Ambulatory Visit: Payer: Self-pay | Admitting: Family Medicine

## 2014-07-15 ENCOUNTER — Encounter: Payer: Self-pay | Admitting: Medical

## 2014-07-15 ENCOUNTER — Ambulatory Visit (INDEPENDENT_AMBULATORY_CARE_PROVIDER_SITE_OTHER): Payer: Medicare Other | Admitting: Medical

## 2014-07-15 VITALS — BP 132/76 | HR 78 | Temp 97.6°F | Ht <= 58 in | Wt 130.4 lb

## 2014-07-15 DIAGNOSIS — R197 Diarrhea, unspecified: Secondary | ICD-10-CM | POA: Insufficient documentation

## 2014-07-15 DIAGNOSIS — K5732 Diverticulitis of large intestine without perforation or abscess without bleeding: Secondary | ICD-10-CM

## 2014-07-15 DIAGNOSIS — N390 Urinary tract infection, site not specified: Secondary | ICD-10-CM

## 2014-07-15 DIAGNOSIS — M545 Low back pain: Secondary | ICD-10-CM

## 2014-07-15 DIAGNOSIS — R1033 Periumbilical pain: Secondary | ICD-10-CM

## 2014-07-15 LAB — POCT URINALYSIS DIPSTICK
Bilirubin, UA: NEGATIVE
Blood, UA: NEGATIVE
Glucose, UA: NEGATIVE
KETONES UA: NEGATIVE
Nitrite, UA: NEGATIVE
PH UA: 5
SPEC GRAV UA: 1.02
Urobilinogen, UA: 0.2

## 2014-07-15 MED ORDER — CIPROFLOXACIN HCL 500 MG PO TABS: 500.0000 mg | ORAL_TABLET | Freq: Two times a day (BID) | ORAL | Status: DC

## 2014-07-15 MED ORDER — METRONIDAZOLE 500 MG PO TABS: 500.0000 mg | ORAL_TABLET | Freq: Three times a day (TID) | ORAL | Status: DC

## 2014-07-15 NOTE — Patient Instructions (Signed)
Your have some urinary infection symptoms.  So I will get a urine culture and start you on cipro.  You also have some pain distribution in left side colon area with some diarrhea/loose stools recently. So I am prescribing flagyl in addition to the above cipro. I want to get a cbc and cmp today. Stool negative for blood in office but I want to do at home testing and bring in cards on  Friday am when you come in for follow up appointment. Also I want to do stool culture testing as well.  Follow up on Friday am or as needed.  For worsening or changing pain before Friday and after hours then ED eval.

## 2014-07-15 NOTE — Assessment & Plan Note (Signed)
You also have some pain distribution in left side colon area with some diarrhea/loose stools recently. So I am prescribing flagyl in addition to the above cipro. I want to get a cbc and cmp today. Stool negative for blood in office but I want to do at home testing and bring in cards on  Friday am when you come in for follow up appointment. Also I want to do stool culture testing as well.

## 2014-07-15 NOTE — Progress Notes (Signed)
Pre visit review using our clinic review tool, if applicable. No additional management support is needed unless otherwise documented below in the visit note. 

## 2014-07-15 NOTE — Assessment & Plan Note (Signed)
Your have some urinary infection symptoms.  So I will get a urine culture and start you on cipro.

## 2014-07-15 NOTE — Assessment & Plan Note (Signed)
Bland diet, hydrate well. Get stool panel studies.

## 2014-07-15 NOTE — Progress Notes (Signed)
Subjective:    Patient ID: Monica Copeland, female    DOB: 1941/05/23, 73 y.o.   MRN: [id number]  HPI   Pt has been some frequent urination and some low back. Just recently frequently urinating.No dysuria. No nausea, no vomiting and no fever. Pressure over her bladder. Pressure over bladder for 2 weeks. Seldom uti history.  Pt has some dark stools about 1 wk ago. 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  Colostomy was only temporary. Since then loose stools. On review of 2014 colonoscopy report it  looked normal. But last week loose stools worse  Pt moderate fatigue.  Last couple of weeks worse pain.  Past Medical History  Diagnosis Date  . Hypertension   . Hyperlipemia   . Asthma   . Diabetes mellitus without complication   . Asthma   . Back pain   . Seizures   . Anxiety   . Cushing disease   . GERD (gastroesophageal reflux disease) 01/24/2012  . Emphysema of lung   . Hyperlipidemia   . Ulcer   . Hemorrhoid     History   Social History  . Marital Status: Widowed    Spouse Name: N/A    Number of Children: N/A  . Years of Education: N/A   Occupational History  . Not on file.   Social History Main Topics  . Smoking status: Current Some Day Smoker -- 0.75 packs/day    Types: Cigarettes  . Smokeless tobacco: Never Used     Comment: Started smoking at age 50.form given 04-02-13  . Alcohol Use: No  . Drug Use: No  . Sexual Activity: Not Currently   Other Topics Concern  . Not on file   Social History Narrative   ** Merged History Encounter **        Past Surgical History  Procedure Laterality Date  . 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 Colon surgery      colon perforation  . Appendectomy    . Candida esophagitis    . Pylorus ulcer    . [place]dominal hyste[place]      [place]    Family History  Problem Relation Age of Onset  . Hypertension Mother   . Hypertension Brother   . Heart attack Mother   . Diabetes Father   .  Asthma Father   . Asthma Son   . Lung cancer Father   . Esophageal cancer Brother   . Liver cancer Brother     mets from esophagus  . Colon cancer Neg Hx     Allergies  Allergen Reactions  . Tomato Rash    Current Outpatient Prescriptions on File Prior to Visit  Medication Sig Dispense Refill  . alendronate (FOSAMAX) 70 MG tablet Take 1 tablet (70 mg total) by mouth every 7 (seven) days. Take with a full glass of water on an empty stomach. 4 tablet 11  . ALPRAZolam (XANAX) 0.5 MG tablet Take 1 tablet (0.5 mg total) by mouth 2 (two) times daily as needed for sleep. 60 tablet 2  . atorvastatin (LIPITOR) 40 MG tablet TAKE 1 TABLET BY MOUTH DAILY 30 tablet 2  . budesonide-formoterol (SYMBICORT) 80-4.5 MCG/ACT inhaler Inhale 2 puffs into the lungs as needed.    . cyclobenzaprine (FLEXERIL) 10 MG tablet TAKE 1 TABLET (10 MG TOTAL) BY MOUTH 3 (THREE) TIMES DAILY AS NEEDED FOR MUSCLE SPASMS. No more med's until seen by provider 60 tablet 0  .  ferrous sulfate 325 (65 FE) MG tablet TAKE 1 TABLET BY MOUTH TWICE DAILY BEFORE LUNCH AND SUPPER 60 tablet 11  . FLUoxetine (PROZAC) 10 MG capsule Take 1 capsule (10 mg total) by mouth daily. 30 capsule 3  . levothyroxine (SYNTHROID, LEVOTHROID) 50 MCG tablet Take 1 tablet (50 mcg total) by mouth daily before breakfast. 30 tablet 5  . pantoprazole (PROTONIX) 40 MG tablet TAKE 1 TABLET BY MOUTH TWICE DAILY 60 tablet 5  . potassium chloride SA (K-DUR,KLOR-CON) 20 MEQ tablet Take 1 tablet (20 mEq total) by mouth daily. 30 tablet 2  . predniSONE (DELTASONE) 5 MG tablet TAKE 1 TABLET BY MOUTH ONCE DAILY. 30 tablet 11  . PROAIR HFA 108 (90 BASE) MCG/ACT inhaler as needed.     . pramipexole (MIRAPEX) 1 MG tablet Take 1 tablet (1 mg total) by mouth at bedtime. 30 tablet 11   No current facility-administered medications on file prior to visit.    BP 132/76 mmHg  Pulse 78  Temp(Src) 97.6 F (36.4 C) (Oral)  Ht 4\' 10"  (1.473 m)  Wt 130 lb 6.4 oz (59.149 kg)   BMI 27.26 kg/m2  SpO2 95%         Review of Systems  Constitutional: Positive for fatigue. Negative for fever and chills.  HENT: Negative.   Respiratory: Negative for cough, chest tightness, shortness of breath and wheezing.   Cardiovascular: Negative for chest pain and palpitations.  Gastrointestinal: Positive for abdominal pain and diarrhea. Negative for nausea, vomiting, constipation, blood in stool, abdominal distention, anal bleeding and rectal pain.  Genitourinary: Positive for dysuria and frequency. Negative for hematuria, decreased urine volume, vaginal bleeding, vaginal pain and pelvic pain.  Musculoskeletal: Positive for back pain.       Lt lower back.   Neurological: Negative for dizziness, tremors, seizures, facial asymmetry, speech difficulty, light-headedness, numbness and headaches.  Hematological: Negative for adenopathy. Does not bruise/bleed easily.       Objective:   Physical Exam   General Appearance- Not in acute distress.  HEENT Eyes- Scleraeral/Conjuntiva-bilat- Not Yellow. Mouth & Throat- Normal.  Chest and Lung Exam Auscultation: Breath sounds:-Normal. CTA Adventitious sounds:- No Adventitious sounds.  Cardiovascular Auscultation:Rythm - Regular. Heart Sounds -Normal heart sounds.  Abdomen Inspection:-Inspection Normal.  Palpation/Perucssion: Palpation and Percussion of the abdomen reveal-  Tenderness umbilical area, suprapubic and llq, No Rebound tenderness, No rigidity(Guarding) and No Palpable abdominal masses.  Liver:-Normal.  Spleen:- Normal.   Rectal Anorectal Exam: Stool - Hemoccult of stool/mucous is Heme Negative. External - normal external exam. Internal - normal sphincter tone. No rectal mass.  Back- Left cva tenderness.       Assessment & Plan:

## 2014-07-16 LAB — COMPREHENSIVE METABOLIC PANEL
ALK PHOS: 82 U/L (ref 39–117)
ALT: 18 U/L (ref 0–35)
AST: 20 U/L (ref 0–37)
Albumin: 3.9 g/dL (ref 3.5–5.2)
BUN: 12 mg/dL (ref 6–23)
CO2: 25 mEq/L (ref 19–32)
CREATININE: 1.2 mg/dL (ref 0.4–1.2)
Calcium: 9.3 mg/dL (ref 8.4–10.5)
Chloride: 101 mEq/L (ref 96–112)
GFR: 48.07 mL/min — ABNORMAL LOW (ref >60.00)
Glucose, Bld: 70 mg/dL (ref 70–99)
Potassium: 3.9 mEq/L (ref 3.5–5.1)
Sodium: 137 mEq/L (ref 135–145)
Total Bilirubin: 0.8 mg/dL (ref 0.2–1.2)
Total Protein: 7.2 g/dL (ref 6.0–8.3)

## 2014-07-16 LAB — CBC WITH DIFFERENTIAL/PLATELET
BASOS PCT: 0.3 % (ref 0.0–3.0)
Basophils Absolute: 0 10*3/uL (ref 0.0–0.1)
Eosinophils Absolute: 0.1 10*3/uL (ref 0.0–0.7)
Eosinophils Relative: 1.1 % (ref 0.0–5.0)
HCT: 46.8 % — ABNORMAL HIGH (ref 36.0–46.0)
Hemoglobin: 15 g/dL (ref 12.0–15.0)
LYMPHS ABS: 3.1 10*3/uL (ref 0.7–4.0)
Lymphocytes Relative: 28.8 % (ref 12.0–46.0)
MCHC: 32.1 g/dL (ref 30.0–36.0)
MCV: 88.7 fl (ref 78.0–100.0)
MONO ABS: 0.7 10*3/uL (ref 0.1–1.0)
Monocytes Relative: 6.1 % (ref 3.0–12.0)
Neutro Abs: 6.9 10*3/uL (ref 1.4–7.7)
Neutrophils Relative %: 63.7 % (ref 43.0–77.0)
Platelets: 217 10*3/uL (ref 150.0–400.0)
RBC: 5.27 Mil/uL — AB (ref 3.87–5.11)
RDW: 14.7 % (ref 11.5–15.5)
WBC: 10.8 10*3/uL — ABNORMAL HIGH (ref 4.0–10.5)

## 2014-07-17 ENCOUNTER — Encounter (HOSPITAL_BASED_OUTPATIENT_CLINIC_OR_DEPARTMENT_OTHER): Payer: Self-pay | Admitting: Emergency Medicine

## 2014-07-17 ENCOUNTER — Emergency Department (HOSPITAL_BASED_OUTPATIENT_CLINIC_OR_DEPARTMENT_OTHER): Payer: Medicare Other

## 2014-07-17 ENCOUNTER — Emergency Department (HOSPITAL_BASED_OUTPATIENT_CLINIC_OR_DEPARTMENT_OTHER)
Admission: EM | Admit: 2014-07-17 | Discharge: 2014-07-17 | Disposition: A | Discharge status: Home / Self Care | Payer: Medicare Other | Attending: Emergency Medicine | Admitting: Emergency Medicine

## 2014-07-17 ENCOUNTER — Ambulatory Visit (INDEPENDENT_AMBULATORY_CARE_PROVIDER_SITE_OTHER): Payer: Medicare Other | Admitting: Medical

## 2014-07-17 ENCOUNTER — Encounter: Payer: Self-pay | Admitting: Medical

## 2014-07-17 VITALS — BP 126/70 | HR 80 | Temp 97.7°F | Ht <= 58 in | Wt 126.6 lb

## 2014-07-17 DIAGNOSIS — K279 Peptic ulcer, site unspecified, unspecified as acute or chronic, without hemorrhage or perforation: Secondary | ICD-10-CM | POA: Insufficient documentation

## 2014-07-17 DIAGNOSIS — R10813 Right lower quadrant abdominal tenderness: Secondary | ICD-10-CM | POA: Insufficient documentation

## 2014-07-17 DIAGNOSIS — E785 Hyperlipidemia, unspecified: Secondary | ICD-10-CM | POA: Insufficient documentation

## 2014-07-17 DIAGNOSIS — K219 Gastro-esophageal reflux disease without esophagitis: Secondary | ICD-10-CM | POA: Diagnosis not present

## 2014-07-17 DIAGNOSIS — R197 Diarrhea, unspecified: Secondary | ICD-10-CM | POA: Diagnosis not present

## 2014-07-17 DIAGNOSIS — Z79899 Other long term (current) drug therapy: Secondary | ICD-10-CM | POA: Insufficient documentation

## 2014-07-17 DIAGNOSIS — K5732 Diverticulitis of large intestine without perforation or abscess without bleeding: Secondary | ICD-10-CM

## 2014-07-17 DIAGNOSIS — M549 Dorsalgia, unspecified: Secondary | ICD-10-CM

## 2014-07-17 DIAGNOSIS — Z792 Long term (current) use of antibiotics: Secondary | ICD-10-CM | POA: Diagnosis not present

## 2014-07-17 DIAGNOSIS — R109 Unspecified abdominal pain: Secondary | ICD-10-CM | POA: Diagnosis present

## 2014-07-17 DIAGNOSIS — E119 Type 2 diabetes mellitus without complications: Secondary | ICD-10-CM | POA: Insufficient documentation

## 2014-07-17 DIAGNOSIS — F419 Anxiety disorder, unspecified: Secondary | ICD-10-CM | POA: Insufficient documentation

## 2014-07-17 DIAGNOSIS — J45909 Unspecified asthma, uncomplicated: Secondary | ICD-10-CM | POA: Insufficient documentation

## 2014-07-17 DIAGNOSIS — I10 Essential (primary) hypertension: Secondary | ICD-10-CM | POA: Insufficient documentation

## 2014-07-17 DIAGNOSIS — R1032 Left lower quadrant pain: Secondary | ICD-10-CM

## 2014-07-17 LAB — CBC WITH DIFFERENTIAL/PLATELET
BASOS ABS: 0 10*3/uL (ref 0.0–0.1)
Basophils Relative: 0 % (ref 0–1)
EOS ABS: 0 10*3/uL (ref 0.0–0.7)
Eosinophils Relative: 0 % (ref 0–5)
HCT: 44.3 % (ref 36.0–46.0)
Hemoglobin: 15 g/dL (ref 12.0–15.0)
LYMPHS ABS: 1.1 10*3/uL (ref 0.7–4.0)
LYMPHS PCT: 10 % — AB (ref 12–46)
MCH: 28.7 pg (ref 26.0–34.0)
MCHC: 33.9 g/dL (ref 30.0–36.0)
MCV: 84.9 fL (ref 78.0–100.0)
Monocytes Absolute: 0.4 10*3/uL (ref 0.1–1.0)
Monocytes Relative: 4 % (ref 3–12)
NEUTROS PCT: 86 % — AB (ref 43–77)
Neutro Abs: 9 10*3/uL — ABNORMAL HIGH (ref 1.7–7.7)
PLATELETS: 224 10*3/uL (ref 150–400)
RBC: 5.22 MIL/uL — AB (ref 3.87–5.11)
RDW: 13.7 % (ref 11.5–15.5)
WBC: 10.5 10*3/uL (ref 4.0–10.5)

## 2014-07-17 LAB — POCT URINALYSIS DIPSTICK
Bilirubin, UA: NEGATIVE
Blood, UA: NEGATIVE
Glucose, UA: NEGATIVE
KETONES UA: NEGATIVE
LEUKOCYTES UA: NEGATIVE
Nitrite, UA: POSITIVE
PH UA: 5
Spec Grav, UA: 1.015
UROBILINOGEN UA: 0.2

## 2014-07-17 LAB — BASIC METABOLIC PANEL
Anion gap: 14 (ref 5–15)
BUN: 13 mg/dL (ref 6–23)
CO2: 24 mEq/L (ref 19–32)
Calcium: 9.6 mg/dL (ref 8.4–10.5)
Chloride: 102 mEq/L (ref 96–112)
Creatinine, Ser: 1.1 mg/dL (ref 0.50–1.10)
GFR calc Af Amer: 56 mL/min — ABNORMAL LOW (ref >90)
GFR calc non Af Amer: 49 mL/min — ABNORMAL LOW (ref >90)
GLUCOSE: 120 mg/dL — AB (ref 70–99)
POTASSIUM: 5 meq/L (ref 3.7–5.3)
Sodium: 140 mEq/L (ref 137–147)

## 2014-07-17 LAB — URINE CULTURE
Colony Count: NO GROWTH
Organism ID, Bacteria: NO GROWTH

## 2014-07-17 MED ORDER — MORPHINE SULFATE 2 MG/ML IJ SOLN
2.0000 mg | Freq: Once | INTRAMUSCULAR | Status: AC
  Administered 2014-07-17: 2 mg via INTRAVENOUS
  Filled 2014-07-17: qty 1

## 2014-07-17 MED ORDER — TRAMADOL HCL 50 MG PO TABS: 50.0000 mg | ORAL_TABLET | Freq: Four times a day (QID) | ORAL | Status: DC | PRN

## 2014-07-17 MED ORDER — SODIUM CHLORIDE 0.9 % IV BOLUS (SEPSIS)
500.0000 mL | Freq: Once | INTRAVENOUS | Status: AC
  Administered 2014-07-17: 500 mL via INTRAVENOUS

## 2014-07-17 MED ORDER — IOHEXOL 300 MG/ML  SOLN
50.0000 mL | Freq: Once | INTRAMUSCULAR | Status: AC | PRN
  Administered 2014-07-17: 50 mL via ORAL

## 2014-07-17 MED ORDER — ONDANSETRON HCL 4 MG/2ML IJ SOLN
4.0000 mg | Freq: Once | INTRAMUSCULAR | Status: AC
  Administered 2014-07-17: 4 mg via INTRAVENOUS
  Filled 2014-07-17: qty 2

## 2014-07-17 MED ORDER — IOHEXOL 300 MG/ML  SOLN
100.0000 mL | Freq: Once | INTRAMUSCULAR | Status: AC | PRN
  Administered 2014-07-17: 100 mL via INTRAVENOUS

## 2014-07-17 NOTE — Progress Notes (Signed)
Pre visit review using our clinic review tool, if applicable. No additional management support is needed unless otherwise documented below in the visit note. 

## 2014-07-17 NOTE — ED Provider Notes (Signed)
CSN: [id number]     Arrival date & time 07/17/14  1039 History   First MD Initiated Contact with Patient 07/17/14 1243     Chief Complaint  Patient presents with  . Abdominal Pain     (Consider location/radiation/quality/duration/timing/severity/associated sxs/prior Treatment) HPI Comments: Patient presents with abdominal pain. She states that she's had abdominal pain for about 2 months intermittently but it's been worse and persistent over last week. She's also started having some loose stools over the last week. She has no blood in her stools or melena. She has had a decreased appetite but no nausea or vomiting. She denies any urinary symptoms. She was seen by her primary care physician 2 days ago for this pain and was started on Cipro and Flagyl. She reports no improvement of symptoms during that time. She denies any fevers or chills. She denies any history of abdominal surgeries.  Patient is a 73 y.o. female presenting with abdominal pain.  Abdominal Pain Associated symptoms: diarrhea   Associated symptoms: no chest pain, no chills, no cough, no fatigue, no fever, no hematuria, no shortness of breath and no vomiting     Past Medical History  Diagnosis Date  . Hypertension   . Hyperlipemia   . Asthma   . Diabetes mellitus without complication   . Asthma   . Back pain   . Seizures   . Anxiety   . Cushing disease   . GERD (gastroesophageal reflux disease) 01/24/2012  . Emphysema of lung   . Hyperlipidemia   . Ulcer   . Hemorrhoid    Past Surgical History  Procedure Laterality Date  . 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 Colon surgery      colon perforation  . Appendectomy    . Candida esophagitis    . Pylorus ulcer    . [place]   Family History  Problem Relation Age of Onset  . Hypertension Mother   . Hypertension Brother   . Heart attack Mother   . Diabetes Father   . Asthma Father   . Asthma Son   . Lung cancer Father   .  Esophageal cancer Brother   . Liver cancer Brother     mets from esophagus  . Colon cancer Neg Hx    History  Substance Use Topics  . Smoking status: Current Every Day Smoker -- 0.75 packs/day    Types: Cigarettes  . Smokeless tobacco: Never Used     Comment: Started smoking at age 9.form given 04-02-13  . Alcohol Use: No   OB History    No data available     Review of Systems  Constitutional: Positive for appetite change. Negative for fever, chills, diaphoresis and fatigue.  HENT: Negative for congestion, rhinorrhea and sneezing.   Eyes: Negative.   Respiratory: Negative for cough, chest tightness and shortness of breath.   Cardiovascular: Negative for chest pain and leg swelling.  Gastrointestinal: Positive for abdominal pain and diarrhea. Negative for vomiting and blood in stool.  Genitourinary: Negative for frequency, hematuria, flank pain and difficulty urinating.  Musculoskeletal: Negative for back pain and arthralgias.  Skin: Negative for rash.  Neurological: Negative for dizziness, speech difficulty, weakness, numbness and headaches.      Allergies  Tomato  Home Medications   Prior to Admission medications   Medication Sig Start Date End Date Taking? Authorizing Provider  alendronate (FOSAMAX) 70 MG tablet Take 1 tablet (70 mg  total) by mouth every 7 (seven) days. Take with a full glass of water on an empty stomach. 09/11/12  Yes Rosalita Chessman, DO  ALPRAZolam (XANAX) 0.5 MG tablet Take 1 tablet (0.5 mg total) by mouth 2 (two) times daily as needed for sleep. 05/28/14  Yes Yvonne R Lowne, DO  atorvastatin (LIPITOR) 40 MG tablet TAKE 1 TABLET BY MOUTH DAILY 06/05/14  Yes Alferd Apa Lowne, DO  budesonide-formoterol (SYMBICORT) 80-4.5 MCG/ACT inhaler Inhale 2 puffs into the lungs as needed. 02/19/13  Yes Alferd Apa Lowne, DO  ciprofloxacin (CIPRO) 500 MG tablet Take 1 tablet (500 mg total) by mouth 2 (two) times daily. 07/15/14  Yes Meriam Sprague Saguier, PA-C  cyclobenzaprine  (FLEXERIL) 10 MG tablet TAKE 1 TABLET (10 MG TOTAL) BY MOUTH 3 (THREE) TIMES DAILY AS NEEDED FOR MUSCLE SPASMS. No more med's until seen by provider 04/15/14  Yes Rosalita Chessman, DO  ferrous sulfate 325 (65 FE) MG tablet TAKE 1 TABLET BY MOUTH TWICE DAILY BEFORE LUNCH AND SUPPER 06/22/14  Yes Yvonne R Lowne, DO  FLUoxetine (PROZAC) 10 MG capsule Take 1 capsule (10 mg total) by mouth daily. 04/20/14  Yes Rosalita Chessman, DO  levothyroxine (SYNTHROID, LEVOTHROID) 50 MCG tablet Take 1 tablet (50 mcg total) by mouth daily before breakfast. 05/06/14  Yes Renato Shin, MD  metroNIDAZOLE (FLAGYL) 500 MG tablet Take 1 tablet (500 mg total) by mouth 3 (three) times daily. 07/15/14  Yes Meriam Sprague Saguier, PA-C  pantoprazole (PROTONIX) 40 MG tablet TAKE 1 TABLET BY MOUTH TWICE DAILY 06/22/14  Yes Alferd Apa Lowne, DO  potassium chloride SA (K-DUR,KLOR-CON) 20 MEQ tablet Take 1 tablet (20 mEq total) by mouth daily. 04/15/13  Yes Yvonne R Lowne, DO  predniSONE (DELTASONE) 5 MG tablet TAKE 1 TABLET BY MOUTH ONCE DAILY. 04/13/14  Yes Renato Shin, MD  PROAIR HFA 108 (90 BASE) MCG/ACT inhaler as needed.  09/20/12  Yes Historical Provider, MD  pramipexole (MIRAPEX) 1 MG tablet Take 1 tablet (1 mg total) by mouth at bedtime. 04/25/13 05/28/14  Rosalita Chessman, DO  traMADol (ULTRAM) 50 MG tablet Take 1 tablet (50 mg total) by mouth every 6 (six) hours as needed. 07/17/14   Malvin Johns, MD   BP 112/58 mmHg  Pulse 61  Temp(Src) 97.8 F (36.6 C) (Oral)  Resp 18  Wt 126 lb (57.153 kg)  SpO2 100% Physical Exam  Constitutional: She is oriented to person, place, and time. She appears well-developed and well-nourished.  HENT:  Head: Normocephalic and atraumatic.  Eyes: Pupils are equal, round, and reactive to light.  Neck: Normal range of motion. Neck supple.  Cardiovascular: Normal rate, regular rhythm and normal heart sounds.   Pulmonary/Chest: Effort normal and breath sounds normal. No respiratory distress. She has no  wheezes. She has no rales. She exhibits no tenderness.  Abdominal: Soft. Bowel sounds are normal. There is tenderness (moderate tenderness to left mid and lower abdomen. There is also tenderness in the right lower quadrant). There is no rebound and no guarding.  Musculoskeletal: Normal range of motion. She exhibits no edema.  Lymphadenopathy:    She has no cervical adenopathy.  Neurological: She is alert and oriented to person, place, and time.  Skin: Skin is warm and dry. No rash noted.  Psychiatric: She has a normal mood and affect.    ED Course  Procedures (including critical care time) Labs Review Labs Reviewed  BASIC METABOLIC PANEL - Abnormal; Notable for the following:  Glucose, Bld 120 (*)    GFR calc non Af Amer 49 (*)    GFR calc Af Amer 56 (*)    All other components within normal limits  CBC WITH DIFFERENTIAL - Abnormal; Notable for the following:    RBC 5.22 (*)    Neutrophils Relative % 86 (*)    Neutro Abs 9.0 (*)    Lymphocytes Relative 10 (*)    All other components within normal limits    Imaging Review Ct Abdomen Pelvis W Contrast  07/17/2014   CLINICAL DATA:  Left lower quadrant pain. Symptoms not relieved with antibiotics. Pain has been intermittent for 3 weeks. Diarrhea but no vomiting.  EXAM: CT ABDOMEN AND PELVIS WITH CONTRAST  TECHNIQUE: Multidetector CT imaging of the abdomen and pelvis was performed using the standard protocol following bolus administration of intravenous contrast.  CONTRAST:  49mL OMNIPAQUE IOHEXOL 300 MG/ML SOLN, 118mL OMNIPAQUE IOHEXOL 300 MG/ML SOLN  COMPARISON:  08/02/2007  FINDINGS: Lung bases are clear.  Negative for free intraperitoneal air.  Low-density in the liver is suggestive for steatosis. No focal liver lesion and there is a normal appearance of the gallbladder and portal venous system. Normal appearance of the pancreas, spleen, adrenal glands and kidneys. Small low-density structures involving the right kidney likely  represent small cysts. There is wall thickening in the gastric antrum and pyloric region. This finding is similar to the previous examination. There is no inflammation surrounding the pylorus. Contrast within the stomach and small bowel. No significant free fluid or lymphadenopathy.  The uterus has been removed. Small amount of fluid in the urinary bladder. Suspect bowel anastomotic clips in the sigmoid colon. No acute inflammatory changes in the bowel. Normal appearance of the terminal ileum and the appendix is not clearly identified.  Small defect along the left lateral abdominal wall likely from previous surgery. Small ventral hernia containing fat. Degenerative changes in lumbar spine. No acute bone abnormality.  IMPRESSION: No acute abnormality within the abdomen or pelvis.  Question hepatic steatosis.  Small abdominal wall hernias or defects. No evidence for bowel involvement.   Electronically Signed   By: Markus Daft M.D.   On: 07/17/2014 14:25     EKG Interpretation None      MDM   Final diagnoses:  Left lower quadrant pain    Pt presents with abd pain.  CT unremarkable.  No vomiting or fevers.  +loose stools but no signs of colitis on CT.  C.diff pending per PCP as well as stool culture.  Urine dipstick was nitrate positive.  Pt currently on cipro.  Urine culture sent by PCP.  Will not change abx pending urine culture.  Encouraged pt to f/u with her GI doctor at [facility].    Malvin Johns, MD 07/17/14 [phone]

## 2014-07-17 NOTE — ED Notes (Signed)
Pt sent from PCP for evaluation of lower left quad pain not relieved by cipro or flagyl. Pt states has been intermittent for 3 weeks. Diarrhea but no vomiting. Denies chills or fever.

## 2014-07-17 NOTE — Progress Notes (Signed)
Subjective:    Patient ID: Monica Copeland, female    DOB: 1940-08-31, 73 y.o.   MRN: [id number]  HPI   Pt in states her stomach pain is about the same per patient. Worse intermittently. No vomiting. No fevers, or chills. Pt woke up and describes terrible diarrhea this am. 3-4 loose stools today.   Pt has been taking her cipro and flagyl.  Past Medical History  Diagnosis Date  . Hypertension   . Hyperlipemia   . Asthma   . Diabetes mellitus without complication   . Asthma   . Back pain   . Seizures   . Anxiety   . Cushing disease   . GERD (gastroesophageal reflux disease) 01/24/2012  . Emphysema of lung   . Hyperlipidemia   . Ulcer   . Hemorrhoid     History   Social History  . Marital Status: Widowed    Spouse Name: N/A    Number of Children: N/A  . Years of Education: N/A   Occupational History  . Not on file.   Social History Main Topics  . Smoking status: Current Every Day Smoker -- 0.75 packs/day    Types: Cigarettes  . Smokeless tobacco: Never Used     Comment: Started smoking at age 60.form given 04-02-13  . Alcohol Use: No  . Drug Use: No  . Sexual Activity: Not Currently   Other Topics Concern  . Not on file   Social History Narrative   ** Merged History Encounter **        Past Surgical History  Procedure Laterality Date  . 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 Colon surgery      colon perforation  . Appendectomy    . Candida esophagitis    . Pylorus ulcer    . [place]dominal [place]      [place]    Family History  Problem Relation Age of Onset  . Hypertension Mother   . Hypertension Brother   . Heart attack Mother   . Diabetes Father   . Asthma Father   . Asthma Son   . Lung cancer Father   . Esophageal cancer Brother   . Liver cancer Brother     mets from esophagus  . Colon cancer Neg Hx     Allergies  Allergen Reactions  . Tomato Rash    Current Outpatient Prescriptions on File Prior to Visit  Medication Sig  Dispense Refill  . alendronate (FOSAMAX) 70 MG tablet Take 1 tablet (70 mg total) by mouth every 7 (seven) days. Take with a full glass of water on an empty stomach. 4 tablet 11  . ALPRAZolam (XANAX) 0.5 MG tablet Take 1 tablet (0.5 mg total) by mouth 2 (two) times daily as needed for sleep. 60 tablet 2  . atorvastatin (LIPITOR) 40 MG tablet TAKE 1 TABLET BY MOUTH DAILY 30 tablet 2  . budesonide-formoterol (SYMBICORT) 80-4.5 MCG/ACT inhaler Inhale 2 puffs into the lungs as needed.    . ciprofloxacin (CIPRO) 500 MG tablet Take 1 tablet (500 mg total) by mouth 2 (two) times daily. 14 tablet 0  . cyclobenzaprine (FLEXERIL) 10 MG tablet TAKE 1 TABLET (10 MG TOTAL) BY MOUTH 3 (THREE) TIMES DAILY AS NEEDED FOR MUSCLE SPASMS. No more med's until seen by provider 60 tablet 0  . ferrous sulfate 325 (65 FE) MG tablet TAKE 1 TABLET BY MOUTH TWICE DAILY BEFORE LUNCH AND SUPPER 60 tablet 11  . FLUoxetine (PROZAC)  10 MG capsule Take 1 capsule (10 mg total) by mouth daily. 30 capsule 3  . levothyroxine (SYNTHROID, LEVOTHROID) 50 MCG tablet Take 1 tablet (50 mcg total) by mouth daily before breakfast. 30 tablet 5  . metroNIDAZOLE (FLAGYL) 500 MG tablet Take 1 tablet (500 mg total) by mouth 3 (three) times daily. 21 tablet 0  . pantoprazole (PROTONIX) 40 MG tablet TAKE 1 TABLET BY MOUTH TWICE DAILY 60 tablet 5  . potassium chloride SA (K-DUR,KLOR-CON) 20 MEQ tablet Take 1 tablet (20 mEq total) by mouth daily. 30 tablet 2  . predniSONE (DELTASONE) 5 MG tablet TAKE 1 TABLET BY MOUTH ONCE DAILY. 30 tablet 11  . PROAIR HFA 108 (90 BASE) MCG/ACT inhaler as needed.     . pramipexole (MIRAPEX) 1 MG tablet Take 1 tablet (1 mg total) by mouth at bedtime. 30 tablet 11   No current facility-administered medications on file prior to visit.    BP 126/70 mmHg  Pulse 80  Temp(Src) 97.7 F (36.5 C) (Oral)  Ht 4\' 10"  (1.473 m)  Wt 126 lb 9.6 oz (57.425 kg)  BMI 26.47 kg/m2  SpO2 99%      Review of Systems    Constitutional: Negative for fever, chills and fatigue.  Respiratory: Negative for cough, chest tightness, shortness of breath and wheezing.   Cardiovascular: Negative for chest pain and palpitations.  Gastrointestinal: Positive for abdominal pain and diarrhea. Negative for nausea, vomiting, constipation, blood in stool, abdominal distention, anal bleeding and rectal pain.  Musculoskeletal: Positive for back pain.  Neurological: Negative.   Hematological: Negative for adenopathy. Does not bruise/bleed easily.         Objective:   Physical Exam   General Appearance- Not in acute distress. But looks overall more fatigued.  HEENT Eyes- Scleraeral/Conjuntiva-bilat- Not Yellow. Mouth & Throat- Normal.  Chest and Lung Exam Auscultation: Breath sounds:-Normal. Adventitious sounds:- No Adventitious sounds.  Cardiovascular Auscultation:Rythm - Regular. Heart Sounds -Normal heart sounds.  Abdomen Inspection:-Inspection Normal.  Palpation/Perucssion: Palpation and Percussion of the abdomen reveal- Very tender left side abdomen and llq. Some rebound.  No Palpable abdominal masses.  Liver:-Normal.  Spleen:- Normal.           Assessment & Plan:

## 2014-07-17 NOTE — Patient Instructions (Addendum)
For your severe abdominal pain with apparent rebound in lt lower quadrant area I want you to be evaluated in the ED. Work up from the other day only showed mild wbc elevation. Otherwise negative.  You have not improved with treatment of flagyl and Cipro for presumed diverticulitis but not gettting better so I do want her evaluated in the ED. Stat imaging studies may be needed  I called down stairs and gave ED heads up on patient coming down. They are busy so main hospital ED is option as well.

## 2014-07-17 NOTE — Discharge Instructions (Signed)

## 2014-07-17 NOTE — Assessment & Plan Note (Signed)
Did not getter better with tx for above.  For your severe abdominal pain with apparent rebound in lt lower quadrant area I want you to be evaluated in the ED. Work up from the other day only showed mild wbc elevation. Otherwise negative.  You have not improved with treatment of flagyl and Cipro for presumed diverticulitis but not gettting better so I do want her evaluated in the ED. Stat imaging studies may be needed  I called down stairs and gave ED heads up on patient coming down. They are busy so main hospital ED is option as well.

## 2014-07-18 LAB — CLOSTRIDIUM DIFFICILE BY PCR: CDIFFPCR: NOT DETECTED

## 2014-07-19 LAB — URINE CULTURE
Colony Count: NO GROWTH
Organism ID, Bacteria: NO GROWTH

## 2014-07-20 ENCOUNTER — Other Ambulatory Visit: Payer: Self-pay | Admitting: Medical

## 2014-07-20 ENCOUNTER — Other Ambulatory Visit: Payer: Self-pay | Admitting: Family Medicine

## 2014-07-20 LAB — OVA AND PARASITE EXAMINATION: OP: NONE SEEN

## 2014-07-20 MED ORDER — ATORVASTATIN CALCIUM 40 MG PO TABS: ORAL_TABLET | ORAL | Status: DC

## 2014-07-21 LAB — STOOL CULTURE

## 2014-07-24 ENCOUNTER — Other Ambulatory Visit: Payer: Self-pay | Admitting: Medical

## 2014-07-28 ENCOUNTER — Ambulatory Visit (INDEPENDENT_AMBULATORY_CARE_PROVIDER_SITE_OTHER): Payer: Medicare Other | Admitting: Family Medicine

## 2014-07-28 VITALS — BP 129/84 | HR 95 | Temp 98.1°F | Wt 127.0 lb

## 2014-07-28 DIAGNOSIS — E039 Hypothyroidism, unspecified: Secondary | ICD-10-CM

## 2014-07-28 DIAGNOSIS — R1013 Epigastric pain: Secondary | ICD-10-CM

## 2014-07-28 DIAGNOSIS — R197 Diarrhea, unspecified: Secondary | ICD-10-CM

## 2014-07-28 DIAGNOSIS — R8290 Unspecified abnormal findings in urine: Secondary | ICD-10-CM

## 2014-07-28 LAB — POCT URINALYSIS DIPSTICK
BILIRUBIN UA: NEGATIVE
Blood, UA: NEGATIVE
KETONES UA: NEGATIVE
Nitrite, UA: NEGATIVE
Protein, UA: NEGATIVE
SPEC GRAV UA: 1.025
Urobilinogen, UA: 0.2
pH, UA: 5.5

## 2014-07-28 MED ORDER — GI COCKTAIL ~~LOC~~
30.0000 mL | Freq: Once | ORAL | Status: AC
  Administered 2014-07-28: 30 mL via ORAL

## 2014-07-28 MED ORDER — HYOSCYAMINE SULFATE ER 0.375 MG PO TB12: 0.3750 mg | ORAL_TABLET | Freq: Two times a day (BID) | ORAL | Status: DC

## 2014-07-28 MED ORDER — GI COCKTAIL ~~LOC~~: 30.0000 mL | Freq: Once | ORAL | Status: DC

## 2014-07-28 NOTE — Progress Notes (Signed)
Pre visit review using our clinic review tool, if applicable. No additional management support is needed unless otherwise documented below in the visit note. 

## 2014-07-28 NOTE — Patient Instructions (Signed)
Stop fosamax for now     Diarrhea Diarrhea is frequent loose and watery bowel movements. It can cause you to feel weak and dehydrated. Dehydration can cause you to become tired and thirsty, have a dry mouth, and have decreased urination that often is dark yellow. Diarrhea is a sign of another problem, most often an infection that will not last long. In most cases, diarrhea typically lasts 2-3 days. However, it can last longer if it is a sign of something more serious. It is important to treat your diarrhea as directed by your caregiver to lessen or prevent future episodes of diarrhea. CAUSES  Some common causes include:  Gastrointestinal infections caused by viruses, bacteria, or parasites.  Food poisoning or food allergies.  Certain medicines, such as antibiotics, chemotherapy, and laxatives.  Artificial sweeteners and fructose.  Digestive disorders. HOME CARE INSTRUCTIONS  Ensure adequate fluid intake (hydration): Have 1 cup (8 oz) of fluid for each diarrhea episode. Avoid fluids that contain simple sugars or sports drinks, fruit juices, whole milk products, and sodas. Your urine should be clear or pale yellow if you are drinking enough fluids. Hydrate with an oral rehydration solution that you can purchase at pharmacies, retail stores, and online. You can prepare an oral rehydration solution at home by mixing the following ingredients together:   - tsp table salt.   tsp baking soda.   tsp salt substitute containing potassium chloride.  1  tablespoons sugar.  1 L (34 oz) of water.  Certain foods and beverages may increase the speed at which food moves through the gastrointestinal (GI) tract. These foods and beverages should be avoided and include:  Caffeinated and alcoholic beverages.  High-fiber foods, such as raw fruits and vegetables, nuts, seeds, and whole grain breads and cereals.  Foods and beverages sweetened with sugar alcohols, such as xylitol, sorbitol, and  mannitol.  Some foods may be well tolerated and may help thicken stool including:  Starchy foods, such as rice, toast, pasta, low-sugar cereal, oatmeal, grits, baked potatoes, crackers, and bagels.  Bananas.  Applesauce.  Add probiotic-rich foods to help increase healthy bacteria in the GI tract, such as yogurt and fermented milk products.  Wash your hands well after each diarrhea episode.  Only take over-the-counter or prescription medicines as directed by your caregiver.  Take a warm bath to relieve any burning or pain from frequent diarrhea episodes. SEEK IMMEDIATE MEDICAL CARE IF:   You are unable to keep fluids down.  You have persistent vomiting.  You have blood in your stool, or your stools are black and tarry.  You do not urinate in 6-8 hours, or there is only a small amount of very dark urine.  You have abdominal pain that increases or localizes.  You have weakness, dizziness, confusion, or light-headedness.  You have a severe headache.  Your diarrhea gets worse or does not get better.  You have a fever or persistent symptoms for more than 2-3 days.  You have a fever and your symptoms suddenly get worse. MAKE SURE YOU:   Understand these instructions.  Will watch your condition.  Will get help right away if you are not doing well or get worse. Document Released: 08/04/2002 Document Revised: 12/29/2013 Document Reviewed: 04/21/2012 [facility] Patient Information 2015 [place], [place]. This information is not intended to replace advice given to you by your health care provider. Make sure you discuss any questions you have with your health care provider.  Food Choices for Gastroesophageal Reflux Disease When you  have gastroesophageal reflux disease (GERD), the foods you eat and your eating habits are very important. Choosing the right foods can help ease the discomfort of GERD. WHAT GENERAL GUIDELINES DO I NEED TO FOLLOW?  Choose fruits, vegetables, whole  grains, low-fat dairy products, and low-fat meat, fish, and poultry.  Limit fats such as oils, salad dressings, butter, nuts, and avocado.  Keep a food diary to identify foods that cause symptoms.  Avoid foods that cause reflux. These may be different for different people.  Eat frequent small meals instead of three large meals each day.  Eat your meals slowly, in a relaxed setting.  Limit fried foods.  Cook foods using methods other than frying.  Avoid drinking alcohol.  Avoid drinking large amounts of liquids with your meals.  Avoid bending over or lying down until 2-3 hours after eating. WHAT FOODS ARE NOT RECOMMENDED? The following are some foods and drinks that may worsen your symptoms: Vegetables Tomatoes. Tomato juice. Tomato and spaghetti sauce. Chili peppers. Onion and garlic. Horseradish. Fruits Oranges, grapefruit, and lemon (fruit and juice). Meats High-fat meats, fish, and poultry. This includes hot dogs, ribs, ham, sausage, salami, and bacon. Dairy Whole milk and chocolate milk. Sour cream. Cream. Butter. Ice cream. Cream cheese.  Beverages Coffee and tea, with or without caffeine. Carbonated beverages or energy drinks. Condiments Hot sauce. Barbecue sauce.  Sweets/Desserts Chocolate and cocoa. Donuts. Peppermint and spearmint. Fats and Oils High-fat foods, including [place] fries and potato chips. Other Vinegar. Strong spices, such as black pepper, white pepper, red pepper, cayenne, curry powder, cloves, ginger, and chili powder. The items listed above may not be a complete list of foods and beverages to avoid. Contact your dietitian for more information. Document Released: 08/14/2005 Document Revised: 08/19/2013 Document Reviewed: 06/18/2013 [facility] Patient Information 2015 [place], [place]. This information is not intended to replace advice given to you by your health care provider. Make sure you discuss any questions you have with your health care  provider.

## 2014-07-29 ENCOUNTER — Encounter: Payer: Self-pay | Admitting: Family Medicine

## 2014-07-29 LAB — BASIC METABOLIC PANEL
BUN: 11 mg/dL (ref 6–23)
CO2: 26 mEq/L (ref 19–32)
CREATININE: 1.2 mg/dL (ref 0.4–1.2)
Calcium: 9 mg/dL (ref 8.4–10.5)
Chloride: 101 mEq/L (ref 96–112)
GFR: 49.03 mL/min — AB (ref >60.00)
Glucose, Bld: 124 mg/dL — ABNORMAL HIGH (ref 70–99)
POTASSIUM: 4.5 meq/L (ref 3.5–5.1)
Sodium: 134 mEq/L — ABNORMAL LOW (ref 135–145)

## 2014-07-29 LAB — HEPATIC FUNCTION PANEL
ALK PHOS: 72 U/L (ref 39–117)
ALT: 25 U/L (ref 0–35)
AST: 28 U/L (ref 0–37)
Albumin: 3.7 g/dL (ref 3.5–5.2)
BILIRUBIN DIRECT: 0.1 mg/dL (ref 0.0–0.3)
BILIRUBIN TOTAL: 0.6 mg/dL (ref 0.2–1.2)
TOTAL PROTEIN: 6.7 g/dL (ref 6.0–8.3)

## 2014-07-29 LAB — CBC WITH DIFFERENTIAL/PLATELET
BASOS PCT: 0.2 % (ref 0.0–3.0)
Basophils Absolute: 0 10*3/uL (ref 0.0–0.1)
EOS PCT: 0.2 % (ref 0.0–5.0)
Eosinophils Absolute: 0 10*3/uL (ref 0.0–0.7)
HCT: 45.6 % (ref 36.0–46.0)
Hemoglobin: 14.9 g/dL (ref 12.0–15.0)
Lymphocytes Relative: 12.7 % (ref 12.0–46.0)
Lymphs Abs: 1.4 10*3/uL (ref 0.7–4.0)
MCHC: 32.6 g/dL (ref 30.0–36.0)
MCV: 87 fl (ref 78.0–100.0)
MONO ABS: 0.2 10*3/uL (ref 0.1–1.0)
MONOS PCT: 2.2 % — AB (ref 3.0–12.0)
NEUTROS PCT: 84.7 % — AB (ref 43.0–77.0)
Neutro Abs: 9.3 10*3/uL — ABNORMAL HIGH (ref 1.4–7.7)
PLATELETS: 196 10*3/uL (ref 150.0–400.0)
RBC: 5.24 Mil/uL — AB (ref 3.87–5.11)
RDW: 14.6 % (ref 11.5–15.5)
WBC: 11 10*3/uL — AB (ref 4.0–10.5)

## 2014-07-29 LAB — TSH: TSH: 0.23 u[IU]/mL — ABNORMAL LOW (ref 0.35–4.50)

## 2014-07-29 LAB — T3, FREE: T3, Free: 3.1 pg/mL (ref 2.3–4.2)

## 2014-07-29 LAB — H. PYLORI ANTIBODY, IGG: H Pylori IgG: NEGATIVE

## 2014-07-29 LAB — T4, FREE: Free T4: 1.38 ng/dL (ref 0.60–1.60)

## 2014-07-29 NOTE — Progress Notes (Signed)
Subjective:    Patient ID: Monica Copeland, female    DOB: 21-Aug-1941, 73 y.o.   MRN: [id number]  HPI Pt here to f/u from ER --- pt still c/o  Diarrhea many x a day with abd cramping.  otc med s not working.  ER w/u neg.  Pt states she feels no better   Review of Systems As above Past Medical History  Diagnosis Date  . Hypertension   . Hyperlipemia   . Asthma   . Diabetes mellitus without complication   . Asthma   . Back pain   . Seizures   . Anxiety   . Cushing disease   . GERD (gastroesophageal reflux disease) 01/24/2012  . Emphysema of lung   . Hyperlipidemia   . Ulcer   . Hemorrhoid    History   Social History  . Marital Status: Widowed    Spouse Name: N/A    Number of Children: N/A  . Years of Education: N/A   Occupational History  . Not on file.   Social History Main Topics  . Smoking status: Current Every Day Smoker -- 0.75 packs/day    Types: Cigarettes  . Smokeless tobacco: Never Used     Comment: Started smoking at age 72.form given 04-02-13  . Alcohol Use: No  . Drug Use: No  . Sexual Activity: Not Currently   Other Topics Concern  . Not on file   Social History Narrative   ** Merged History Encounter **       Current Outpatient Prescriptions  Medication Sig Dispense Refill  . alendronate (FOSAMAX) 70 MG tablet Take 1 tablet (70 mg total) by mouth every 7 (seven) days. Take with a full glass of water on an empty stomach. 4 tablet 11  . ALPRAZolam (XANAX) 0.5 MG tablet Take 1 tablet (0.5 mg total) by mouth 2 (two) times daily as needed for sleep. 60 tablet 2  . atorvastatin (LIPITOR) 40 MG tablet TAKE 1 TABLET BY MOUTH DAILY 30 tablet 2  . budesonide-formoterol (SYMBICORT) 80-4.5 MCG/ACT inhaler Inhale 2 puffs into the lungs as needed.    . ciprofloxacin (CIPRO) 500 MG tablet Take 1 tablet (500 mg total) by mouth 2 (two) times daily. 14 tablet 0  . cyclobenzaprine (FLEXERIL) 10 MG tablet TAKE 1 TABLET (10 MG TOTAL) BY MOUTH 3 (THREE) TIMES  DAILY AS NEEDED FOR MUSCLE SPASMS. No more med's until seen by provider 60 tablet 0  . ferrous sulfate 325 (65 FE) MG tablet TAKE 1 TABLET BY MOUTH TWICE DAILY BEFORE LUNCH AND SUPPER 60 tablet 11  . FLUoxetine (PROZAC) 10 MG capsule Take 1 capsule (10 mg total) by mouth daily. 30 capsule 3  . levothyroxine (SYNTHROID, LEVOTHROID) 50 MCG tablet Take 1 tablet (50 mcg total) by mouth daily before breakfast. 30 tablet 5  . metroNIDAZOLE (FLAGYL) 500 MG tablet Take 1 tablet (500 mg total) by mouth 3 (three) times daily. 21 tablet 0  . pantoprazole (PROTONIX) 40 MG tablet TAKE 1 TABLET BY MOUTH TWICE DAILY 60 tablet 5  . potassium chloride SA (K-DUR,KLOR-CON) 20 MEQ tablet Take 1 tablet (20 mEq total) by mouth daily. 30 tablet 2  . pramipexole (MIRAPEX) 1 MG tablet TAKE 1 TABLET BY MOUTH EVERY NIGHT AT BEDTIME 60 tablet 1  . predniSONE (DELTASONE) 5 MG tablet TAKE 1 TABLET BY MOUTH ONCE DAILY. 30 tablet 11  . PROAIR HFA 108 (90 BASE) MCG/ACT inhaler as needed.     . traMADol (ULTRAM) 50 MG  tablet Take 1 tablet (50 mg total) by mouth every 6 (six) hours as needed. 15 tablet 0  . hyoscyamine (LEVBID) 0.375 MG 12 hr tablet Take 1 tablet (0.375 mg total) by mouth 2 (two) times daily. 60 tablet 0   No current facility-administered medications for this visit.       Objective:   Physical Exam  BP 129/84 mmHg  Pulse 95  Temp(Src) 98.1 F (36.7 C) (Oral)  Wt 127 lb (57.607 kg)  SpO2 96% General appearance: alert, cooperative, appears stated age and no distress Throat: abnormal findings: dry mucous membranes Lungs: clear to auscultation bilaterally Heart: regular rate and rhythm, S1, S2 normal, no murmur, click, rub or gallop Abdomen: abnormal findings:  moderate tenderness in the epigastrium  Soft  + BS        Assessment & Plan:  1. Diarrhea For over 1 month with 1 visit to ER --- pt did not want to go to ER again.  Pt clinically dehydrated.  She agreed that if the meds prescribed did not help  she would go to ER  - Ambulatory referral to Gastroenterology - hyoscyamine (LEVBID) 0.375 MG 12 hr tablet; Take 1 tablet (0.375 mg total) by mouth 2 (two) times daily.  Dispense: 60 tablet; Refill: 0 - Basic metabolic panel - CBC with Differential - Hepatic function panel - TSH - T3, free - T4, free  2. Dyspepsia  - Ambulatory referral to Gastroenterology - Basic metabolic panel - CBC with Differential - Hepatic function panel - H. pylori antibody, IgG - gi cocktail (Maalox,Lidocaine,Donnatal); Take 30 mLs by mouth once.  3. Hypothyroidism, unspecified hypothyroidism type Check labs-- ? Cause of diarrhea - TSH - T3, free - T4, free 4. Urine abnormality  - POCT Urinalysis Dipstick - Urine Culture

## 2014-07-30 ENCOUNTER — Other Ambulatory Visit: Payer: Self-pay

## 2014-07-30 MED ORDER — LEVOTHYROXINE SODIUM 25 MCG PO TABS: 25.0000 ug | ORAL_TABLET | Freq: Every day | ORAL | Status: DC

## 2014-07-31 ENCOUNTER — Telehealth: Payer: Self-pay | Admitting: *Deleted

## 2014-07-31 LAB — URINE CULTURE
COLONY COUNT: NO GROWTH
ORGANISM ID, BACTERIA: NO GROWTH

## 2014-07-31 NOTE — Telephone Encounter (Signed)
Received fax from [facility] requesting home health for falls, unsteady gait and confusion. Order forwarded to Dr. Etter Sjogren. JG//CMA

## 2014-07-31 NOTE — Telephone Encounter (Signed)
Received fax from [facility] for home health for patient for falls, unsteady gait and confusion. Order form forwarded to Dr. Etter Sjogren. JG//CMA

## 2014-07-31 NOTE — Telephone Encounter (Signed)
Order along with OV notes faxed to [facility] at [phone]. JG//CMA

## 2014-08-03 ENCOUNTER — Ambulatory Visit (INDEPENDENT_AMBULATORY_CARE_PROVIDER_SITE_OTHER): Payer: Medicare Other | Admitting: Nurse Practitioner

## 2014-08-03 ENCOUNTER — Other Ambulatory Visit: Payer: Self-pay

## 2014-08-03 ENCOUNTER — Encounter: Payer: Self-pay | Admitting: Nurse Practitioner

## 2014-08-03 VITALS — BP 106/64 | HR 88 | Ht <= 58 in | Wt 125.4 lb

## 2014-08-03 DIAGNOSIS — R197 Diarrhea, unspecified: Secondary | ICD-10-CM

## 2014-08-03 DIAGNOSIS — R1033 Periumbilical pain: Secondary | ICD-10-CM

## 2014-08-03 NOTE — Patient Instructions (Signed)
Please call us with the name of the medication you have at home that helps you with diarrhea. [phone]. Ask for Izola Teague.  If dizziness continues,call your Primary care physician for an appointment or you should go to the Emergency Room at the hospital .

## 2014-08-03 NOTE — Progress Notes (Addendum)
History of Present Illness:   Patient is a 73 year old female known to Dr. Henrene Pastor. She has a history of Candida esophagitis , GERD , ulcer disease. She is status post remote sigmoid resection presumably for diverticular disease. She was evaluated by [place] August 2014 for iron deficiency anemia. Subsequent colonoscopy was unremarkable. 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 Duodenal biopsies to rule out sprue were unremarkable  Patient was seen by her PCP 07/15/14. At that time she was felt to have a urinary tract infection, started on Cipro. She complained of loose stool and left sided abdominal pain for which stool studies were ordered and an empirical course of Flagyl was given. Stool studies later proved negative. Two days after seeing PCP patient went to the emergency department for evaluation of the same symptoms. CT scan of the abdomen and pelvis with contrast was negative for acute findings. Labs were unremarkable except for mild neutrophilia. Urinalysis showed positive nitrites but the patient was already on Cipro and urine culture was negative. She was discharged from emergency department on Ultram.   Patient is referred by PCP for evaluation of diarrhea and dyspepsia. She is a very poor historian, her sister-in-law is here trying to help with details but lives out of town and has limited details herself. Patient tells me she has chronic (years) intermittent diarrhea which has been worse for "a while" .She also developed lower abdominal pain sometime a few months back. H pylori antibody is negative. Patient tells me that whatever the emergency department gave her last month has really helped the diarrhea. According to Epic, the ED gave her Ultram, I don't see any other prescriptions.     Current Medications, Allergies, Past Medical History, Past Surgical History, Family History and Social History were reviewed in [employer] record.  Studies:    Ct Abdomen Pelvis W Contrast  07/17/2014   CLINICAL DATA:  Left lower quadrant pain. Symptoms not relieved with antibiotics. Pain has been intermittent for 3 weeks. Diarrhea but no vomiting.  EXAM: CT ABDOMEN AND PELVIS WITH CONTRAST  TECHNIQUE: Multidetector CT imaging of the abdomen and pelvis was performed using the standard protocol following bolus administration of intravenous contrast.  CONTRAST:  24mL OMNIPAQUE IOHEXOL 300 MG/ML SOLN, 15mL OMNIPAQUE IOHEXOL 300 MG/ML SOLN  COMPARISON:  08/02/2007  FINDINGS: Lung bases are clear.  Negative for free intraperitoneal air.  Low-density in the liver is suggestive for steatosis. No focal liver lesion and there is a normal appearance of the gallbladder and portal venous system. Normal appearance of the pancreas, spleen, adrenal glands and kidneys. Small low-density structures involving the right kidney likely represent small cysts. There is wall thickening in the gastric antrum and pyloric region. This finding is similar to the previous examination. There is no inflammation surrounding the pylorus. Contrast within the stomach and small bowel. No significant free fluid or lymphadenopathy.  The uterus has been removed. Small amount of fluid in the urinary bladder. Suspect bowel anastomotic clips in the sigmoid colon. No acute inflammatory changes in the bowel. Normal appearance of the terminal ileum and the appendix is not clearly identified.  Small defect along the left lateral abdominal wall likely from previous surgery. Small ventral hernia containing fat. Degenerative changes in lumbar spine. No acute bone abnormality.  IMPRESSION: No acute abnormality within the abdomen or pelvis.  Question hepatic steatosis.  Small abdominal wall hernias or defects. No evidence for bowel involvement.   Electronically Signed   By: Quita Skye  Anselm Pancoast M.D.   On: 07/17/2014 14:25    Physical Exam: General: Pleasant, well developed , white female in no acute distress Head:  Normocephalic and atraumatic Eyes:  sclerae anicteric, conjunctiva pink  Ears: Normal auditory acuity Lungs: Clear throughout to auscultation Heart: Regular rate and rhythm Abdomen: Soft, non distended, mild generalized tenderness.  No masses, no hepatomegaly. Normal bowel sounds Musculoskeletal: Symmetrical with no gross deformities  Extremities: No edema  Neurological: Alert, confused about details.  Psychological:  Alert and cooperative. Normal mood and affect  Assessment and Recommendations:  72. 73 year old female with chronic, intermittent diarrhea. Patient is a very poor historian, she gives conflicting /confusing details. It sounds like diarrhea became worse sometime around June. She averages 3-4 loose stools a day though some days only 1-2 BMs. She takes anti-diarrheal agents as needed. Patient also complains of abdominal pain (points to periumbilical area) which started a few months back.    On 11/18 PCP prescribed Flagyl for abdominal pain /diarrhea and Cipro for presumed UTI.     ED visit 07/17/14- CT scan, stool studies and labs all unremarkable. Urinalysis in ED was positive for nitrites but patient was already on Cipro for UTI. Urine culture turned out negative. Repeat u/a on 12/1 was negative .   Etiology of symptoms undetermined at this point but "diarrhea med given at ED helps". I see in Epic that [facility] ED prescribed Ultram  Then, on 12/1 she got a prescription for Levbid filled at [place], perhaps this is what patient believes is helping. She is taking the Levbid once daily. I told her it was okay to take BID if needed.    There was question of dyspepsia per PCP note but patient denies upper abdominal pain. Recent h.pylori was negative. She is on a daily PPI.      Sister in law gave me daughter's number [phone]) in an attempt to get more information . I left a message. At this point she should continue Levbid since it helps.    2. Dizziness.  Meeting patient for first time today not sure if this is new for her. Vitals stable.      Addendum 08/10/14: Patient admitted to hospital after visit with me. Admitted for confusion / encephalopathy of unclear etiology. Neuro evaluated. Psych evaluated. Head imaging negative. Patient did test positive 08/06/14 for c-diff, she was negative for c-diff at time of 11/20 ED visit. Discharged from hospital on flagyl.

## 2014-08-04 ENCOUNTER — Emergency Department (HOSPITAL_COMMUNITY): Payer: Medicare Other

## 2014-08-04 ENCOUNTER — Encounter (HOSPITAL_COMMUNITY): Payer: Self-pay | Admitting: Emergency Medicine

## 2014-08-04 ENCOUNTER — Telehealth: Payer: Self-pay | Admitting: Family Medicine

## 2014-08-04 ENCOUNTER — Encounter (HOSPITAL_COMMUNITY): Payer: Self-pay | Admitting: Internal Medicine

## 2014-08-04 ENCOUNTER — Inpatient Hospital Stay (HOSPITAL_COMMUNITY)
Admission: EM | Admit: 2014-08-04 | Discharge: 2014-08-07 | DRG: 071 | Disposition: A | Discharge status: Home / Self Care | Payer: Medicare Other | Attending: Internal Medicine | Admitting: Internal Medicine

## 2014-08-04 ENCOUNTER — Other Ambulatory Visit: Payer: Self-pay

## 2014-08-04 ENCOUNTER — Encounter: Payer: Self-pay | Admitting: Nurse Practitioner

## 2014-08-04 DIAGNOSIS — R4182 Altered mental status, unspecified: Secondary | ICD-10-CM | POA: Diagnosis not present

## 2014-08-04 DIAGNOSIS — F17210 Nicotine dependence, cigarettes, uncomplicated: Secondary | ICD-10-CM | POA: Diagnosis not present

## 2014-08-04 DIAGNOSIS — E249 Cushing's syndrome, unspecified: Secondary | ICD-10-CM | POA: Diagnosis present

## 2014-08-04 DIAGNOSIS — A047 Enterocolitis due to Clostridium difficile: Secondary | ICD-10-CM | POA: Diagnosis present

## 2014-08-04 DIAGNOSIS — E785 Hyperlipidemia, unspecified: Secondary | ICD-10-CM | POA: Diagnosis present

## 2014-08-04 DIAGNOSIS — E2740 Unspecified adrenocortical insufficiency: Secondary | ICD-10-CM | POA: Diagnosis present

## 2014-08-04 DIAGNOSIS — E230 Hypopituitarism: Secondary | ICD-10-CM | POA: Diagnosis present

## 2014-08-04 DIAGNOSIS — Z6826 Body mass index (BMI) 26.0-26.9, adult: Secondary | ICD-10-CM | POA: Diagnosis not present

## 2014-08-04 DIAGNOSIS — R109 Unspecified abdominal pain: Secondary | ICD-10-CM | POA: Insufficient documentation

## 2014-08-04 DIAGNOSIS — G9340 Encephalopathy, unspecified: Principal | ICD-10-CM | POA: Diagnosis present

## 2014-08-04 DIAGNOSIS — F419 Anxiety disorder, unspecified: Secondary | ICD-10-CM | POA: Diagnosis present

## 2014-08-04 DIAGNOSIS — F329 Major depressive disorder, single episode, unspecified: Secondary | ICD-10-CM | POA: Diagnosis present

## 2014-08-04 DIAGNOSIS — I10 Essential (primary) hypertension: Secondary | ICD-10-CM | POA: Diagnosis not present

## 2014-08-04 DIAGNOSIS — J439 Emphysema, unspecified: Secondary | ICD-10-CM | POA: Diagnosis not present

## 2014-08-04 DIAGNOSIS — J45909 Unspecified asthma, uncomplicated: Secondary | ICD-10-CM | POA: Diagnosis present

## 2014-08-04 DIAGNOSIS — R410 Disorientation, unspecified: Secondary | ICD-10-CM

## 2014-08-04 DIAGNOSIS — E440 Moderate protein-calorie malnutrition: Secondary | ICD-10-CM | POA: Insufficient documentation

## 2014-08-04 DIAGNOSIS — K219 Gastro-esophageal reflux disease without esophagitis: Secondary | ICD-10-CM | POA: Diagnosis not present

## 2014-08-04 DIAGNOSIS — E119 Type 2 diabetes mellitus without complications: Secondary | ICD-10-CM | POA: Diagnosis not present

## 2014-08-04 LAB — CBC
HCT: 43.5 % (ref 36.0–46.0)
Hemoglobin: 14.8 g/dL (ref 12.0–15.0)
MCH: 29.1 pg (ref 26.0–34.0)
MCHC: 34 g/dL (ref 30.0–36.0)
MCV: 85.6 fL (ref 78.0–100.0)
PLATELETS: 217 10*3/uL (ref 150–400)
RBC: 5.08 MIL/uL (ref 3.87–5.11)
RDW: 13.6 % (ref 11.5–15.5)
WBC: 9.5 10*3/uL (ref 4.0–10.5)

## 2014-08-04 LAB — COMPREHENSIVE METABOLIC PANEL
ALT: 25 U/L (ref 0–35)
AST: 36 U/L (ref 0–37)
Albumin: 3.5 g/dL (ref 3.5–5.2)
Alkaline Phosphatase: 78 U/L (ref 39–117)
Anion gap: 16 — ABNORMAL HIGH (ref 5–15)
BUN: 13 mg/dL (ref 6–23)
CALCIUM: 9.3 mg/dL (ref 8.4–10.5)
CO2: 25 meq/L (ref 19–32)
CREATININE: 1.08 mg/dL (ref 0.50–1.10)
Chloride: 97 mEq/L (ref 96–112)
GFR calc non Af Amer: 50 mL/min — ABNORMAL LOW (ref >90)
GFR, EST AFRICAN AMERICAN: 58 mL/min — AB (ref >90)
GLUCOSE: 99 mg/dL (ref 70–99)
Potassium: 3.7 mEq/L (ref 3.7–5.3)
SODIUM: 138 meq/L (ref 137–147)
Total Bilirubin: 0.4 mg/dL (ref 0.3–1.2)
Total Protein: 7.2 g/dL (ref 6.0–8.3)

## 2014-08-04 LAB — BLOOD GAS, ARTERIAL
ACID-BASE EXCESS: 2.3 mmol/L — AB (ref 0.0–2.0)
Bicarbonate: 26.5 mEq/L — ABNORMAL HIGH (ref 20.0–24.0)
FIO2: 0.21 %
O2 Saturation: 94.6 %
PO2 ART: 72.1 mmHg — AB (ref 80.0–100.0)
Patient temperature: 98.7
TCO2: 23.1 mmol/L (ref 0–100)
pCO2 arterial: 41.5 mmHg (ref 35.0–45.0)
pH, Arterial: 7.421 (ref 7.350–7.450)

## 2014-08-04 LAB — URINALYSIS, ROUTINE W REFLEX MICROSCOPIC
Glucose, UA: NEGATIVE mg/dL
Hgb urine dipstick: NEGATIVE
Ketones, ur: NEGATIVE mg/dL
LEUKOCYTES UA: NEGATIVE
Nitrite: NEGATIVE
PROTEIN: NEGATIVE mg/dL
Specific Gravity, Urine: 1.022 (ref 1.005–1.030)
UROBILINOGEN UA: 0.2 mg/dL (ref 0.0–1.0)
pH: 5 (ref 5.0–8.0)

## 2014-08-04 LAB — CARBOXYHEMOGLOBIN
CARBOXYHEMOGLOBIN: 1.4 % (ref 0.5–1.5)
Methemoglobin: 2.6 % — ABNORMAL HIGH (ref 0.0–1.5)
O2 SAT: 94.7 %
TOTAL HEMOGLOBIN: 14.3 g/dL (ref 12.0–16.0)

## 2014-08-04 LAB — GLUCOSE, CAPILLARY: GLUCOSE-CAPILLARY: 114 mg/dL — AB (ref 70–99)

## 2014-08-04 LAB — CBG MONITORING, ED: GLUCOSE-CAPILLARY: 91 mg/dL (ref 70–99)

## 2014-08-04 LAB — AMMONIA: Ammonia: 10 umol/L — ABNORMAL LOW (ref 11–60)

## 2014-08-04 LAB — TROPONIN I: Troponin I: <0.3 ng/mL (ref <0.30)

## 2014-08-04 LAB — CK: Total CK: 118 U/L (ref 7–177)

## 2014-08-04 MED ORDER — PRAMIPEXOLE DIHYDROCHLORIDE 1 MG PO TABS
1.0000 mg | ORAL_TABLET | Freq: Every day | ORAL | Status: DC
  Administered 2014-08-05 – 2014-08-06 (×2): 1 mg via ORAL
  Filled 2014-08-04 (×3): qty 1

## 2014-08-04 MED ORDER — HYDROCORTISONE NA SUCCINATE PF 100 MG IJ SOLR
100.0000 mg | Freq: Once | INTRAMUSCULAR | Status: AC
  Administered 2014-08-04: 100 mg via INTRAVENOUS
  Filled 2014-08-04: qty 2

## 2014-08-04 MED ORDER — LEVOTHYROXINE SODIUM 50 MCG PO TABS
50.0000 ug | ORAL_TABLET | Freq: Every day | ORAL | Status: DC
  Administered 2014-08-05: 50 ug via ORAL
  Filled 2014-08-04: qty 1

## 2014-08-04 MED ORDER — HYOSCYAMINE SULFATE ER 0.375 MG PO TB12
0.3750 mg | ORAL_TABLET | Freq: Two times a day (BID) | ORAL | Status: DC
  Administered 2014-08-05 – 2014-08-07 (×5): 0.375 mg via ORAL
  Filled 2014-08-04 (×6): qty 1

## 2014-08-04 MED ORDER — ENOXAPARIN SODIUM 40 MG/0.4ML ~~LOC~~ SOLN
40.0000 mg | Freq: Every day | SUBCUTANEOUS | Status: DC
  Administered 2014-08-04 – 2014-08-06 (×3): 40 mg via SUBCUTANEOUS
  Filled 2014-08-04 (×3): qty 0.4

## 2014-08-04 MED ORDER — ALBUTEROL SULFATE (2.5 MG/3ML) 0.083% IN NEBU: 3.0000 mL | INHALATION_SOLUTION | Freq: Four times a day (QID) | RESPIRATORY_TRACT | Status: DC | PRN

## 2014-08-04 MED ORDER — LORAZEPAM 2 MG/ML IJ SOLN
1.0000 mg | Freq: Once | INTRAMUSCULAR | Status: AC
  Administered 2014-08-04: 1 mg via INTRAVENOUS
  Filled 2014-08-04: qty 1

## 2014-08-04 MED ORDER — HYDROCORTISONE NA SUCCINATE PF 100 MG IJ SOLR
50.0000 mg | Freq: Four times a day (QID) | INTRAMUSCULAR | Status: DC
  Administered 2014-08-04 – 2014-08-05 (×2): 50 mg via INTRAVENOUS
  Filled 2014-08-04 (×3): qty 1

## 2014-08-04 MED ORDER — PANTOPRAZOLE SODIUM 40 MG PO TBEC
40.0000 mg | DELAYED_RELEASE_TABLET | Freq: Two times a day (BID) | ORAL | Status: DC
  Administered 2014-08-05 – 2014-08-07 (×5): 40 mg via ORAL
  Filled 2014-08-04 (×5): qty 1

## 2014-08-04 MED ORDER — BUDESONIDE-FORMOTEROL FUMARATE 80-4.5 MCG/ACT IN AERO
2.0000 | INHALATION_SPRAY | Freq: Two times a day (BID) | RESPIRATORY_TRACT | Status: DC
  Administered 2014-08-05 – 2014-08-07 (×5): 2 via RESPIRATORY_TRACT
  Filled 2014-08-04: qty 6.9

## 2014-08-04 MED ORDER — CYCLOBENZAPRINE HCL 5 MG PO TABS
5.0000 mg | ORAL_TABLET | Freq: Three times a day (TID) | ORAL | Status: DC | PRN
  Administered 2014-08-05: 5 mg via ORAL
  Filled 2014-08-04 (×2): qty 1

## 2014-08-04 MED ORDER — FLUOXETINE HCL 10 MG PO CAPS
10.0000 mg | ORAL_CAPSULE | Freq: Every day | ORAL | Status: DC
  Administered 2014-08-05 – 2014-08-07 (×3): 10 mg via ORAL
  Filled 2014-08-04 (×3): qty 1

## 2014-08-04 MED ORDER — PREDNISONE 5 MG PO TABS
5.0000 mg | ORAL_TABLET | Freq: Every day | ORAL | Status: DC
  Administered 2014-08-05 – 2014-08-07 (×3): 5 mg via ORAL
  Filled 2014-08-04 (×3): qty 1

## 2014-08-04 MED ORDER — SODIUM CHLORIDE 0.9 % IV SOLN
INTRAVENOUS | Status: AC
  Administered 2014-08-04 – 2014-08-05 (×3): via INTRAVENOUS

## 2014-08-04 MED ORDER — ALPRAZOLAM 0.5 MG PO TABS
0.5000 mg | ORAL_TABLET | Freq: Two times a day (BID) | ORAL | Status: DC | PRN
  Administered 2014-08-05 – 2014-08-06 (×2): 0.5 mg via ORAL
  Filled 2014-08-04 (×2): qty 1

## 2014-08-04 MED ORDER — ATORVASTATIN CALCIUM 40 MG PO TABS
40.0000 mg | ORAL_TABLET | Freq: Every day | ORAL | Status: DC
  Administered 2014-08-05 – 2014-08-06 (×2): 40 mg via ORAL
  Filled 2014-08-04 (×3): qty 1

## 2014-08-04 MED ORDER — POTASSIUM CHLORIDE CRYS ER 20 MEQ PO TBCR
20.0000 meq | EXTENDED_RELEASE_TABLET | Freq: Every day | ORAL | Status: DC
  Administered 2014-08-05 – 2014-08-07 (×3): 20 meq via ORAL
  Filled 2014-08-04 (×3): qty 1

## 2014-08-04 MED ORDER — INFLUENZA VAC SPLIT QUAD 0.5 ML IM SUSY
0.5000 mL | PREFILLED_SYRINGE | INTRAMUSCULAR | Status: AC
  Administered 2014-08-05: 0.5 mL via INTRAMUSCULAR
  Filled 2014-08-04 (×2): qty 0.5

## 2014-08-04 MED ORDER — SODIUM CHLORIDE 0.9 % IV BOLUS (SEPSIS): 1000.0000 mL | Freq: Once | INTRAVENOUS | Status: DC

## 2014-08-04 MED ORDER — HALOPERIDOL LACTATE 5 MG/ML IJ SOLN
2.0000 mg | Freq: Once | INTRAMUSCULAR | Status: AC
  Administered 2014-08-04: 2 mg via INTRAVENOUS
  Filled 2014-08-04: qty 1

## 2014-08-04 NOTE — ED Notes (Signed)
Patient transported to MRI 

## 2014-08-04 NOTE — Progress Notes (Signed)
Assessment and plans reviewed. Appears that poor history is making assessment difficult. No obvious acute or serious issues. Nevin Bloodgood to follow-up as outlined

## 2014-08-04 NOTE — H&P (Signed)
[facility] History and Physical  Monica Copeland AST:[id number] DOB: 1941/06/11 DOA: 08/04/2014  Referring physician: ER physician. PCP: Garnet Koyanagi, DO   Chief Complaint: Altered mental status.  History obtained from patient's sons. Patient is confused.  HPI: Monica Copeland is a 74 y.o. 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 As per patient's family patient has been having diarrhea last month for many days and was treated empirically with medications name of which is not clearly known. Patient's diarrhea has recently improved. Patient was recently placed on tramadol for pain relief for her abdominal discomfort. Patient medications are closely monitored by patient's family and they stated that there was no oral medication by the patient. Today morning patient was found naked and confused at her house and patient's son brought her to the ER. CT head did not show anything acute and plan was to get MRI of the brain. But patient was very agitated and had to be given initially Ativan followed by Haldol. On exam patient presently is quite sedated but arousable. As per the family patient did not have any chest pain or shortness of breath nausea vomiting but did complain of some right flank pain. Patient did not have any fever chills and has been afebrile in the ER and chest x-ray and UA has been unremarkable. On exam patient does not have any neck rigidity.   Review of Systems: As presented in the history of presenting illness, rest negative.  Past Medical History  Diagnosis Date  . Hypertension   . Hyperlipemia   . Asthma   . Diabetes mellitus without complication   . Asthma   . Back pain   . Seizures   . Anxiety   . Cushing disease   . GERD (gastroesophageal reflux disease)  01/24/2012  . Emphysema of lung   . Hyperlipidemia   . Ulcer   . Hemorrhoid    Past Surgical History  Procedure Laterality Date  . 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 Colon surgery      colon perforation  . Appendectomy    . Candida esophagitis    . Pylorus ulcer    . [place]   Social History:  reports that she has been smoking Cigarettes.  She has been smoking about 0.75 packs per day. She has never used smokeless tobacco. She reports that she does not drink alcohol or use illicit drugs. Where does patient live home. Can patient participate in ADLs? Yes.  Allergies  Allergen Reactions  . Tomato Rash    Family History:  Family History  Problem Relation Age of Onset  . Hypertension Mother   . Hypertension Brother   . Heart attack Mother   . Diabetes Father   . Asthma Father   . Asthma Son   . Lung cancer Father   . Esophageal cancer Brother   . Liver cancer Brother     mets from esophagus  . Colon cancer Neg Hx       Prior to Admission medications   Medication Sig Start Date End Date Taking? Authorizing Provider  alendronate (FOSAMAX) 70 MG tablet Take 1 tablet (70 mg total) by mouth every 7 (seven) days. Take with a full glass of water on an empty stomach. 09/11/12  Yes Alferd Apa  Lowne, DO  ALPRAZolam (XANAX) 0.5 MG tablet Take 1 tablet (0.5 mg total) by mouth 2 (two) times daily as needed for sleep. 05/28/14  Yes Yvonne R Lowne, DO  atorvastatin (LIPITOR) 40 MG tablet TAKE 1 TABLET BY MOUTH DAILY 07/20/14  Yes Alferd Apa Lowne, DO  budesonide-formoterol (SYMBICORT) 80-4.5 MCG/ACT inhaler Inhale 2 puffs into the lungs as needed. 02/19/13  Yes Yvonne R Lowne, DO  cyclobenzaprine (FLEXERIL) 10 MG tablet TAKE 1 TABLET (10 MG TOTAL) BY MOUTH 3 (THREE) TIMES DAILY AS NEEDED FOR MUSCLE SPASMS. No more med's until seen by provider 04/15/14  Yes Rosalita Chessman, DO  FLUoxetine (PROZAC) 10 MG capsule Take 1 capsule (10 mg total) by mouth daily. 04/20/14   Yes Yvonne R Lowne, DO  hyoscyamine (LEVBID) 0.375 MG 12 hr tablet Take 1 tablet (0.375 mg total) by mouth 2 (two) times daily. 07/28/14  Yes Rosalita Chessman, DO  levothyroxine (SYNTHROID, LEVOTHROID) 50 MCG tablet Take 50 mcg by mouth daily before breakfast.   Yes Historical Provider, MD  pantoprazole (PROTONIX) 40 MG tablet TAKE 1 TABLET BY MOUTH TWICE DAILY 06/22/14  Yes Yvonne R Lowne, DO  potassium chloride SA (K-DUR,KLOR-CON) 20 MEQ tablet Take 1 tablet (20 mEq total) by mouth daily. 04/15/13  Yes Yvonne R Lowne, DO  pramipexole (MIRAPEX) 1 MG tablet TAKE 1 TABLET BY MOUTH EVERY NIGHT AT BEDTIME 07/20/14  Yes Yvonne R Lowne, DO  predniSONE (DELTASONE) 5 MG tablet TAKE 1 TABLET BY MOUTH ONCE DAILY. 04/13/14  Yes Renato Shin, MD  PROAIR HFA 108 (90 BASE) MCG/ACT inhaler Inhale 1-2 puffs into the lungs every 6 (six) hours as needed for wheezing or shortness of breath.  09/20/12  Yes Historical Provider, MD  ferrous sulfate 325 (65 FE) MG tablet TAKE 1 TABLET BY MOUTH TWICE DAILY BEFORE LUNCH AND SUPPER Patient not taking: Reported on 08/03/2014 06/22/14   Rosalita Chessman, DO  levothyroxine (SYNTHROID, LEVOTHROID) 25 MCG tablet Take 1 tablet (25 mcg total) by mouth daily before breakfast. Patient not taking: Reported on 08/04/2014 07/30/14   Rosalita Chessman, DO  traMADol (ULTRAM) 50 MG tablet Take 1 tablet (50 mg total) by mouth every 6 (six) hours as needed. Patient not taking: Reported on 08/04/2014 07/17/14   Malvin Johns, MD    Physical Exam: Filed Vitals:   08/04/14 1443 08/04/14 1551 08/04/14 1757 08/04/14 2010  BP: 125/68 110/81 115/49 110/62  Pulse: 62 61 59 49  Temp: 97.8 F (36.6 C)     TempSrc: Oral     Resp: 20 14 20 16   SpO2: 100% 98% 99% 94%     General:  Moderately built and poorly nourished.  Eyes: Anicteric no pallor. PERRLA positive.  ENT: No discharge from the ears eyes nose more.  Neck: No mass felt. No neck rigidity.  Cardiovascular: S1-S2 heard.  Respiratory: No  rhonchi or crepitations.  Abdomen: Soft nontender bowel sounds present.  Skin: No rash.  Musculoskeletal: No edema.  Psychiatric: Patient is sedated but arousable.  Neurologic: Patient is sedated but arousable.  Labs on Admission:  Basic Metabolic Panel:  Recent Labs Lab 08/04/14 1300  NA 138  K 3.7  CL 97  CO2 25  GLUCOSE 99  BUN 13  CREATININE 1.08  CALCIUM 9.3   Liver Function Tests:  Recent Labs Lab 08/04/14 1300  AST 36  ALT 25  ALKPHOS 78  BILITOT 0.4  PROT 7.2  ALBUMIN 3.5   No results for input(s): LIPASE, AMYLASE in  the last 168 hours. No results for input(s): AMMONIA in the last 168 hours. CBC:  Recent Labs Lab 08/04/14 1300  WBC 9.5  HGB 14.8  HCT 43.5  MCV 85.6  PLT 217   Cardiac Enzymes: No results for input(s): CKTOTAL, CKMB, CKMBINDEX, TROPONINI in the last 168 hours.  BNP (last 3 results) No results for input(s): PROBNP in the last 8760 hours. CBG:  Recent Labs Lab 08/04/14 1220  GLUCAP 91    Radiological Exams on Admission: Dg Chest 2 View  08/04/2014   CLINICAL DATA:  Pt very confused today per family. Hx diabetes and asthma. Hx htn  EXAM: CHEST - 2 VIEW  COMPARISON:  02/07/2013  FINDINGS: Lungs are clear. Heart size and mediastinal contours are within normal limits. No effusion. Visualized skeletal structures are unremarkable.  IMPRESSION: No acute cardiopulmonary disease.   Electronically Signed   By: Arne Cleveland M.D.   On: 08/04/2014 17:53   Ct Head Wo Contrast  08/04/2014   CLINICAL DATA:  Altered mental status, hypertension, hyperlipidemia, diabetes, asthma, smoker  EXAM: CT HEAD WITHOUT CONTRAST  TECHNIQUE: Contiguous axial images were obtained from the base of the skull through the vertex without intravenous contrast.  COMPARISON:  04/24/2012  FINDINGS: Minimal atrophy.  Normal ventricular morphology.  No midline shift or mass effect.  Mild small vessel chronic ischemic changes of deep cerebral white matter.  No  intracranial hemorrhage, mass lesion or evidence acute infarction.  No extra-axial fluid collections.  Chronic sphenoid sinus opacification.  Atherosclerotic calcifications at the carotid siphons.  Bones demineralized.  IMPRESSION: Atrophy with small vessel chronic ischemic changes of deep cerebral white matter.  No acute intracranial abnormalities.   Electronically Signed   By: Lavonia Dana M.D.   On: 08/04/2014 19:40     Assessment/Plan Principal Problem:   Acute encephalopathy Active Problems:   Cushing syndrome   Pituitary insufficiency   1. Acute encephalopathy - cause is not clear. Patient is afebrile and does not have any definite signs of meningitis or encephalitis at this time. MRI of the brain is pending. In addition I ordered ammonia levels. ABG does not show any carbon dioxide retention and also does not show any increase in carboxyhemoglobin. There is slight elevation in methemoglobin which I did discuss with on-call pulmonary critical care Dr. Lamonte Sakai who feels that this time it is not very significant. At this time we will closely observe and follow MRI findings and mental status changes. Have also ordered EEG. Check urine drug screen. Patient is arousable and the does respond to her name. There was no seizure-like activities. 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 2. History of Cushing's disease status post surgery with replacement prednisone and Synthroid - check TSH and at this time I have placed patient on IV hydrocortisone. In the morning if patient is still not able to take oral prednisone then stress dose of IV hydrocortisone should be continued. 3. History of asthma presently not wheezing. 4. Hyperlipidemia continue home medications once patient can take orally. 5. History of tobacco abuse will need tobacco cessation counseling. 6. Bradycardia - patient was found to be in sinus bradycardia.  Check EKG and TSH. Closely observe in telemetry.    Code Status: Full code.  Family Communication: Patient's family at the bedside.  Disposition Plan: Admit to inpatient.    Oracio Galen N. [facility] Pager [phone].  If 7PM-7AM, please contact night-coverage [email or web address] Password  [email or web address] 08/04/2014, 9:42 PM

## 2014-08-04 NOTE — ED Provider Notes (Signed)
CSN: [id number]     Arrival date & time 08/04/14  1215 History   First MD Initiated Contact with Patient 08/04/14 1324     Chief Complaint  Patient presents with  . Altered Mental Status    HPI  Monica Copeland is a 73 year old female who presents today with altered mental status. History is collected from her children [name], [name]). This AM, [name] found her naked at home as she was trying to put in a bag as underpants. They report she has been progressively getting worse since Sunday and has been talking about her dead husband as well as rambling. At baseline, she is able to walk her dogs and is much more oriented. She lives alone but denies any fever, chest pain, shortness of breath.   Past Medical History  Diagnosis Date  . Hypertension   . Hyperlipemia   . Asthma   . Diabetes mellitus without complication   . Asthma   . Back pain   . Seizures   . Anxiety   . Cushing disease   . GERD (gastroesophageal reflux disease) 01/24/2012  . Emphysema of lung   . Hyperlipidemia   . Ulcer   . Hemorrhoid    Past Surgical History  Procedure Laterality Date  . 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 Colon surgery      colon perforation  . Appendectomy    . Candida esophagitis    . Pylorus ulcer    . [place]   Family History  Problem Relation Age of Onset  . Hypertension Mother   . Hypertension Brother   . Heart attack Mother   . Diabetes Father   . Asthma Father   . Asthma Son   . Lung cancer Father   . Esophageal cancer Brother   . Liver cancer Brother     mets from esophagus  . Colon cancer Neg Hx    History  Substance Use Topics  . Smoking status: Current Every Day Smoker -- 0.75 packs/day    Types: Cigarettes  . Smokeless tobacco: Never Used     Comment: Started smoking at age 6.form given 04-02-13  . Alcohol Use: No   OB History    No data available     Review of Systems  Constitutional: Negative for fever.  Cardiovascular:  Negative for chest pain.  Gastrointestinal: Negative for abdominal distention.  Neurological: Positive for speech difficulty.  Psychiatric/Behavioral: Positive for confusion and agitation. The patient is hyperactive.       Allergies  Tomato  Home Medications   Prior to Admission medications   Medication Sig Start Date End Date Taking? Authorizing Provider  alendronate (FOSAMAX) 70 MG tablet Take 1 tablet (70 mg total) by mouth every 7 (seven) days. Take with a full glass of water on an empty stomach. 09/11/12  Yes Rosalita Chessman, DO  ALPRAZolam (XANAX) 0.5 MG tablet Take 1 tablet (0.5 mg total) by mouth 2 (two) times daily as needed for sleep. 05/28/14  Yes Yvonne R Lowne, DO  atorvastatin (LIPITOR) 40 MG tablet TAKE 1 TABLET BY MOUTH DAILY 07/20/14  Yes Alferd Apa Lowne, DO  budesonide-formoterol (SYMBICORT) 80-4.5 MCG/ACT inhaler Inhale 2 puffs into the lungs as needed. 02/19/13  Yes Yvonne R Lowne, DO  cyclobenzaprine (FLEXERIL) 10 MG tablet TAKE 1 TABLET (10 MG TOTAL) BY MOUTH 3 (THREE) TIMES DAILY AS NEEDED FOR MUSCLE SPASMS. No more med's until seen by provider  04/15/14  Yes Yvonne R Lowne, DO  FLUoxetine (PROZAC) 10 MG capsule Take 1 capsule (10 mg total) by mouth daily. 04/20/14  Yes Yvonne R Lowne, DO  hyoscyamine (LEVBID) 0.375 MG 12 hr tablet Take 1 tablet (0.375 mg total) by mouth 2 (two) times daily. 07/28/14  Yes Rosalita Chessman, DO  levothyroxine (SYNTHROID, LEVOTHROID) 50 MCG tablet Take 50 mcg by mouth daily before breakfast.   Yes Historical Provider, MD  pantoprazole (PROTONIX) 40 MG tablet TAKE 1 TABLET BY MOUTH TWICE DAILY 06/22/14  Yes Yvonne R Lowne, DO  potassium chloride SA (K-DUR,KLOR-CON) 20 MEQ tablet Take 1 tablet (20 mEq total) by mouth daily. 04/15/13  Yes Yvonne R Lowne, DO  pramipexole (MIRAPEX) 1 MG tablet TAKE 1 TABLET BY MOUTH EVERY NIGHT AT BEDTIME 07/20/14  Yes Yvonne R Lowne, DO  predniSONE (DELTASONE) 5 MG tablet TAKE 1 TABLET BY MOUTH ONCE DAILY. 04/13/14  Yes  Renato Shin, MD  PROAIR HFA 108 (90 BASE) MCG/ACT inhaler Inhale 1-2 puffs into the lungs every 6 (six) hours as needed for wheezing or shortness of breath.  09/20/12  Yes Historical Provider, MD  ferrous sulfate 325 (65 FE) MG tablet TAKE 1 TABLET BY MOUTH TWICE DAILY BEFORE LUNCH AND SUPPER Patient not taking: Reported on 08/03/2014 06/22/14   Rosalita Chessman, DO  levothyroxine (SYNTHROID, LEVOTHROID) 25 MCG tablet Take 1 tablet (25 mcg total) by mouth daily before breakfast. Patient not taking: Reported on 08/04/2014 07/30/14   Rosalita Chessman, DO  traMADol (ULTRAM) 50 MG tablet Take 1 tablet (50 mg total) by mouth every 6 (six) hours as needed. Patient not taking: Reported on 08/04/2014 07/17/14   Malvin Johns, MD   BP 125/68 mmHg  Pulse 62  Temp(Src) 97.8 F (36.6 C) (Oral)  Resp 20  SpO2 100% Physical Exam  Constitutional: No distress.  HENT:  Head: Normocephalic and atraumatic.  Mouth/Throat: No oropharyngeal exudate.  Eyes: Conjunctivae are normal. Pupils are equal, round, and reactive to light.  Cardiovascular: Regular rhythm and normal heart sounds.  Exam reveals no gallop and no friction rub.   No murmur heard. Pulmonary/Chest: Effort normal and breath sounds normal. No respiratory distress. She has no wheezes. She has no rales.  Abdominal: Soft. Bowel sounds are normal.  Mild tenderness to palpation though difficult to assess given her agitation.  Neurological:  Oriented to name [name]), place Gershon Mussel [facility]), month (December), president (Obama) but not year [phone]). CN II-XII intact. CN II-XII intact, 4/5 upper and lower extremity strength, 2+ grip strength, rapid alternating movements testing inconclusive given her agitation. Gait abnormal per family.    ED Course  Procedures (including critical care time) Labs Review Labs Reviewed  URINALYSIS, ROUTINE W REFLEX MICROSCOPIC - Abnormal; Notable for the following:    Color, Urine AMBER (*)    Bilirubin Urine SMALL (*)     All other components within normal limits  COMPREHENSIVE METABOLIC PANEL - Abnormal; Notable for the following:    GFR calc non Af Amer 50 (*)    GFR calc Af Amer 58 (*)    Anion gap 16 (*)    All other components within normal limits  CBC  CBG MONITORING, ED    Imaging Review No results found.   EKG Interpretation None      MDM   Final diagnoses:  Altered mental status    Per family, she is not at baseline. Her electrolytes and UA are otherwise reassuring for no metabolic/infectious cause to her  delirium. Post-ictal state possible given her history of seizures as she was recently prescribed Tramadol from Urgent Care. CVA/TIA also possible though neurologic exam otherwise intact. TSH low when last checked on 12/1 and unclear if she has been taking Synthroid regularly. Will check brain MRI to r/o lesions.     Charlott Rakes, MD 08/04/14 1513  Orlie Dakin, MD 08/04/14 1726

## 2014-08-04 NOTE — Telephone Encounter (Signed)
Agree 

## 2014-08-04 NOTE — ED Provider Notes (Signed)
1700 - Care from Dr. Winfred Leeds. Awaiting MR head for new altered mental status. Given 2 mg Haldol IV for some agitation. Unable to get MR, CT obtained - normal. Admitted.  1. Altered mental status   2. Altered mental state      Monica Bucy, MD 08/04/14 2108

## 2014-08-04 NOTE — Telephone Encounter (Signed)
[name] name: Oletta Cohn Relation to pt: daughter Call back number: Pharmacy:  Reason for call:   Patient daughter called in stating that patient was very confused, came to the door naked, will not take medication, "talking off the wall". Informed Caren Griffins that I could not talk to her because of DPR. I talked to [facility], RN and she told me to tell Caren Griffins to take patient to the nearest ED since we cannot talk to her.

## 2014-08-04 NOTE — ED Notes (Signed)
Bed: WA07 Expected date:  Expected time:  Means of arrival:  Comments: EMS-AMS 

## 2014-08-04 NOTE — ED Notes (Signed)
Per ems pt from home, per family pt has been confused today, pt answered her door with no clothes on. Per es pt answered all assessment questions appropriately, pt oriented to time, person and situation. No HX of dementia . BP 125/76 HR 70 RR 18. Pt has no pain complain.  Pt is ambulatory.

## 2014-08-04 NOTE — ED Provider Notes (Signed)
Level 5 caveat history is obtained from patient's son and daughter. Patient was well yesterday. She answered the door this morning where no close confused talking about her dead husband she could not dress herself per her husband. She looks improved now. Her family members without treatment on exam patient is alert and oriented 3 cranial nerves II through XII intact moves all extremities well gait is unsteady. She would fall without assistance. Patient normally walks unassisted and walks her dog regularly per family members. MRI scan of the brain ordered as inserted for posterior circulation stroke  Date: 08/04/2014  Rate: 70  Rhythm: normal sinus rhythm  QRS Axis: normal  Intervals: normal  ST/T Wave abnormalities: nonspecific T wave changes  Conduction Disutrbances:none  Narrative Interpretation:   Old EKG Reviewed: Rate has slowed compared to 02/07/2013 interpreted by me Pt signed out to Dr. Mingo Amber at [place] pm  Orlie Dakin, MD 08/04/14 1715

## 2014-08-04 NOTE — ED Notes (Signed)
Called into room by family members stating pt needs to use restroom. Pt does not state she needs to void, pt trying to climb out of bed to go to "sunday school", pt redirected. Encouraged family to call out if pt needs to use restroom.

## 2014-08-05 ENCOUNTER — Inpatient Hospital Stay (HOSPITAL_COMMUNITY): Payer: Medicare Other

## 2014-08-05 ENCOUNTER — Inpatient Hospital Stay (HOSPITAL_COMMUNITY)
Admission: EM | Admit: 2014-08-05 | Discharge: 2014-08-05 | Disposition: A | Discharge status: Home / Self Care | Payer: Medicare Other | Source: Home / Self Care | Attending: Internal Medicine | Admitting: Internal Medicine

## 2014-08-05 DIAGNOSIS — E785 Hyperlipidemia, unspecified: Secondary | ICD-10-CM

## 2014-08-05 DIAGNOSIS — F329 Major depressive disorder, single episode, unspecified: Secondary | ICD-10-CM

## 2014-08-05 DIAGNOSIS — E249 Cushing's syndrome, unspecified: Secondary | ICD-10-CM

## 2014-08-05 DIAGNOSIS — E440 Moderate protein-calorie malnutrition: Secondary | ICD-10-CM | POA: Insufficient documentation

## 2014-08-05 DIAGNOSIS — G9340 Encephalopathy, unspecified: Secondary | ICD-10-CM | POA: Diagnosis not present

## 2014-08-05 LAB — CBC WITH DIFFERENTIAL/PLATELET
Basophils Absolute: 0 10*3/uL (ref 0.0–0.1)
Basophils Relative: 0 % (ref 0–1)
EOS PCT: 0 % (ref 0–5)
Eosinophils Absolute: 0 10*3/uL (ref 0.0–0.7)
HEMATOCRIT: 41.9 % (ref 36.0–46.0)
Hemoglobin: 14 g/dL (ref 12.0–15.0)
LYMPHS ABS: 1 10*3/uL (ref 0.7–4.0)
LYMPHS PCT: 19 % (ref 12–46)
MCH: 28.7 pg (ref 26.0–34.0)
MCHC: 33.4 g/dL (ref 30.0–36.0)
MCV: 85.9 fL (ref 78.0–100.0)
MONO ABS: 0.1 10*3/uL (ref 0.1–1.0)
MONOS PCT: 2 % — AB (ref 3–12)
Neutro Abs: 4.1 10*3/uL (ref 1.7–7.7)
Neutrophils Relative %: 79 % — ABNORMAL HIGH (ref 43–77)
Platelets: 213 10*3/uL (ref 150–400)
RBC: 4.88 MIL/uL (ref 3.87–5.11)
RDW: 13.9 % (ref 11.5–15.5)
WBC: 5.2 10*3/uL (ref 4.0–10.5)

## 2014-08-05 LAB — TSH: TSH: 0.055 u[IU]/mL — ABNORMAL LOW (ref 0.350–4.500)

## 2014-08-05 LAB — COMPREHENSIVE METABOLIC PANEL
ALT: 19 U/L (ref 0–35)
AST: 25 U/L (ref 0–37)
Albumin: 3.1 g/dL — ABNORMAL LOW (ref 3.5–5.2)
Alkaline Phosphatase: 68 U/L (ref 39–117)
Anion gap: 14 (ref 5–15)
BUN: 13 mg/dL (ref 6–23)
CALCIUM: 8.8 mg/dL (ref 8.4–10.5)
CO2: 24 mEq/L (ref 19–32)
CREATININE: 0.95 mg/dL (ref 0.50–1.10)
Chloride: 101 mEq/L (ref 96–112)
GFR calc Af Amer: 67 mL/min — ABNORMAL LOW (ref >90)
GFR calc non Af Amer: 58 mL/min — ABNORMAL LOW (ref >90)
Glucose, Bld: 127 mg/dL — ABNORMAL HIGH (ref 70–99)
Potassium: 3.9 mEq/L (ref 3.7–5.3)
SODIUM: 139 meq/L (ref 137–147)
Total Bilirubin: 0.5 mg/dL (ref 0.3–1.2)
Total Protein: 6.6 g/dL (ref 6.0–8.3)

## 2014-08-05 LAB — GLUCOSE, CAPILLARY
GLUCOSE-CAPILLARY: 115 mg/dL — AB (ref 70–99)
Glucose-Capillary: 114 mg/dL — ABNORMAL HIGH (ref 70–99)
Glucose-Capillary: 79 mg/dL (ref 70–99)

## 2014-08-05 LAB — RAPID URINE DRUG SCREEN, HOSP PERFORMED
AMPHETAMINES: NOT DETECTED
BARBITURATES: POSITIVE — AB
Benzodiazepines: POSITIVE — AB
Cocaine: NOT DETECTED
Opiates: NOT DETECTED
TETRAHYDROCANNABINOL: NOT DETECTED

## 2014-08-05 MED ORDER — CHLORHEXIDINE GLUCONATE 0.12 % MT SOLN
15.0000 mL | Freq: Two times a day (BID) | OROMUCOSAL | Status: DC
  Administered 2014-08-05 – 2014-08-07 (×3): 15 mL via OROMUCOSAL
  Filled 2014-08-05 (×5): qty 15

## 2014-08-05 MED ORDER — LEVOTHYROXINE SODIUM 25 MCG PO TABS
25.0000 ug | ORAL_TABLET | Freq: Every day | ORAL | Status: DC
  Administered 2014-08-06 – 2014-08-07 (×2): 25 ug via ORAL
  Filled 2014-08-05 (×2): qty 1

## 2014-08-05 MED ORDER — ENSURE COMPLETE PO LIQD
237.0000 mL | Freq: Two times a day (BID) | ORAL | Status: DC
  Administered 2014-08-06 – 2014-08-07 (×3): 237 mL via ORAL

## 2014-08-05 MED ORDER — CETYLPYRIDINIUM CHLORIDE 0.05 % MT LIQD
7.0000 mL | Freq: Two times a day (BID) | OROMUCOSAL | Status: DC
  Administered 2014-08-07: 7 mL via OROMUCOSAL

## 2014-08-05 NOTE — Evaluation (Addendum)
Physical Therapy Evaluation Patient Details Name: Monica Copeland MRN: [id number] DOB: 02-09-1941 Today's Date: 08/05/2014   History of Present Illness  73 yo female admitted with actue encephalopathy. Pt lives alone in senior living apt  Clinical Impression  On eval, pt required Min guard-Min assist (at times) for mobility-able to ambulate ~75 feet with rolling walker. Session time limited due to imaging tech waiting to transport pt for MRI. BP 99/57 standing-pt reported some initial dizziness that resolved enough to ambulate. Will likely need to work with pt, at least one more time prior to d/c to ensure pt is safe enough to return home alone.     Follow Up Recommendations Home health PT (depending on progress);Supervision for OOB/mobility    Equipment Recommendations  None recommended by PT    Recommendations for Other Services OT consult     Precautions / Restrictions Precautions Precautions: Fall Restrictions Weight Bearing Restrictions: No      Mobility  Bed Mobility Overal bed mobility: Modified Independent                Transfers Overall transfer level: Modified independent                  Ambulation/Gait Ambulation/Gait assistance: Min guard Ambulation Distance (Feet): 75 Feet Assistive device: Rolling walker (2 wheeled) Gait Pattern/deviations: Step-through pattern;Decreased stride length;Drifts right/left;Staggering left;Staggering right     General Gait Details: used RW for safety. Pt unsteady at times. Moves quickly.Min assist intermittently but mostly Min guard. May be able to ambulate without walker on next visit.  Stairs            Wheelchair Mobility    Modified Rankin (Stroke Patients Only)       Balance                                             Pertinent Vitals/Pain Pain Assessment: No/denies pain    Home Living Family/patient expects to be discharged to:: Private residence Living  Arrangements: Alone   Type of Home: Apartment Home Access: Stairs to enter   [employer] of Steps: 1 Home Layout: One level Home Equipment: Environmental consultant - 2 wheels;Electric scooter      Prior Function Level of Independence: Independent with assistive device(s)               Hand Dominance        Extremity/Trunk Assessment   Upper Extremity Assessment: Overall WFL for tasks assessed           Lower Extremity Assessment: Generalized weakness      Cervical / Trunk Assessment: Normal  Communication   Communication: No difficulties  Cognition Arousal/Alertness: Awake/alert Behavior During Therapy: WFL for tasks assessed/performed Overall Cognitive Status: Within Functional Limits for tasks assessed                      General Comments      Exercises        Assessment/Plan    PT Assessment Patient needs continued PT services  PT Diagnosis Difficulty walking;Generalized weakness   PT Problem List Decreased mobility;Decreased balance  PT Treatment Interventions DME instruction;Gait training;Functional mobility training;Therapeutic activities;Patient/family education;Balance training;Therapeutic exercise   PT Goals (Current goals can be found in the Care Plan section) Acute Rehab PT Goals Patient Stated Goal: none stated PT Goal Formulation: With patient Time For Goal  Achievement: 08/19/14 Potential to Achieve Goals: Good    Frequency Min 3X/week   Barriers to discharge        Co-evaluation               End of Session   Activity Tolerance: Patient tolerated treatment well Patient left: in bed;with call bell/phone within reach;with family/visitor present           Time: 1100-1110 PT Time Calculation (min) (ACUTE ONLY): 10 min   Charges:   PT Evaluation $Initial PT Evaluation Tier I: 1 Procedure PT Treatments $Gait Training: 8-22 mins   PT G Codes:          Weston Anna, MPT Pager: [phone]

## 2014-08-05 NOTE — Care Management Note (Addendum)
    Page 1 of 1   08/06/2014     4:03:53 PM CARE MANAGEMENT NOTE 08/06/2014  Patient:  Monica Copeland, Monica Copeland   Account Number:  [id number]  Date Initiated:  08/05/2014  Documentation initiated by:  [facility]  Subjective/Objective Assessment:   73 Y/O F ADMITTED W/AMS.ACUTE ENCEPHALOPATHY.     Action/Plan:   FROM HOME ALONE.   Anticipated DC Date:  08/07/2014   Anticipated DC Plan:  [place]  CM consult      Choice offered to / List presented to:  C-1 Patient        [place] arranged  HH-1 RN  [place].   Status of service:  Completed, signed off Medicare Important Message given?   (If response is "NO", the following Medicare IM given date fields will be blank) Date Medicare IM given:   Medicare IM given by:   Date Additional Medicare IM given:   Additional Medicare IM given by:    Discharge Disposition:  [place]  Per UR Regulation:  Reviewed for med. necessity/level of care/duration of stay  If discussed at [place] of Stay Meetings, dates discussed:    Comments:  08/06/14 Dessa Phi RN BSN NCM [phone] 4p-received HHRN/PT/OT orders, face to face.[place] aware. [facility] chosen for HHPT.TC ah[facility] rep Kristen aware of referral & recommendation.Await final HHPT order, & face to face.  08/05/14 Caniya Tagle RN,BSN NCM [phone] PT-HH.WILL PROVIDE [facility]HC AGENCY LIST TO CHOOSE.AWAIT FINAL HHPT ORDER.

## 2014-08-05 NOTE — Procedures (Signed)
EEG report.  Brief clinical history:  73 year old female who presents with altered mental status.    Technique: this is a 17 channel routine scalp EEG performed at the bedside with bipolar and monopolar montages arranged in accordance to the international 10/20 system of electrode placement. One channel was dedicated to EKG recording.  The study was performed during wakefulness, drowsiness, and stage 2 sleep. No activating procedures performed.  Description:In the wakeful state, the best background consisted of a medium amplitude, posterior dominant, well sustained, symmetric and reactive 8 Hz rhythm.. Drowsiness demonstrated dropout of the alpha rhythm. Stage 2 sleep showed symmetric and synchronous sleep spindles without intermixed epileptiform discharges. No focal or generalized epileptiform discharges noted.  No pathologic areas of slowing seen.  EKG showed sinus rhythm.   Impression: this is a normal awake and asleep EEG. Please, be aware that a normal EEG does not exclude the possibility of epilepsy.  Clinical correlation is advised.   Dorian Pod, MD

## 2014-08-05 NOTE — Progress Notes (Signed)
INITIAL NUTRITION ASSESSMENT  DOCUMENTATION CODES Per approved criteria  -Non-severe (moderate) malnutrition in the context of acute illness or injury  Pt meets criteria for moderate MALNUTRITION in the context of acute illness as evidenced by 17% wt loss x 6 months and energy intake <75% for >7 days.  INTERVENTION: -Provide Ensure Complete po BID, each supplement provides 350 kcal and 13 grams of protein -Encourage PO intake -RD to continue to monitor  NUTRITION DIAGNOSIS: Inadequate oral intake related to decreased appetite as evidenced by 17% wt loss x 6 months.   Goal: Pt to meet >/= 90% of their estimated nutrition needs   Monitor:  PO and supplemental intake, weight, labs, I/O's  Reason for Assessment: Pt identified as at nutrition risk on the Malnutrition Screen Tool  Admitting Dx: Acute encephalopathy  ASSESSMENT: 73 y.o. 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   Pt and pt's son in room during visit. Per son, pt has had some weight loss around 12 lb. Per weight history documentation, pt has had 24 lb weight loss since June (17% wt loss x 6 months, significant for time frame).  Pt's appetite has been down, per son. PO intake: 100%.  RD to order Ensure BID, pt and son agreeable to supplement.  Nutrition focused physical exam shows no sign of depletion of muscle mass or body fat.  Labs reviewed: Glucose 127  Height: Ht Readings from Last 1 Encounters:  08/04/14 4\' 9"  (1.448 m)    Weight: Wt Readings from Last 1 Encounters:  08/04/14 120 lb 12.8 oz (54.795 kg)    Ideal Body Weight: 95 lb  % Ideal Body Weight: 126%  Wt Readings from Last 10 Encounters:  08/04/14 120 lb 12.8 oz (54.795 kg)  08/05/14 120 lb  (54.432 kg)  08/03/14 125 lb 6 oz (56.87 kg)  07/28/14 127 lb (57.607 kg)  07/17/14 126 lb (57.153 kg)  07/17/14 126 lb 9.6 oz (57.425 kg)  07/15/14 130 lb 6.4 oz (59.149 kg)  05/28/14 129 lb 6.4 oz (58.695 kg)  05/05/14 130 lb (58.968 kg)  02/02/14 144 lb (65.318 kg)    Usual Body Weight: 115-120 lb -per pt  % Usual Body Weight: 100%  BMI:  Body mass index is 26.13 kg/(m^2).  Estimated Nutritional Needs: Kcal: 1400-1600 Protein: 65-75g Fluid: 1.5L/day  Skin: intact  Diet Order: Diet Heart  EDUCATION NEEDS: -Education not appropriate at this time   Intake/Output Summary (Last 24 hours) at 08/05/14 1527 Last data filed at 08/05/14 1500  Gross per 24 hour  Intake 1511.25 ml  Output    450 ml  Net 1061.25 ml    Last BM: 12/7   Labs:   Recent Labs Lab 08/04/14 1300 08/05/14 0456  NA 138 139  K 3.7 3.9  CL 97 101  CO2 25 24  BUN 13 13  CREATININE 1.08 0.95  CALCIUM 9.3 8.8  GLUCOSE 99 127*    CBG (last 3)   Recent Labs  08/04/14 1220 08/04/14 2314 08/05/14 0742  GLUCAP 91 114* 114*    Scheduled Meds: . antiseptic oral rinse  7 mL Mouth Rinse q12n4p  . atorvastatin  40 mg Oral q1800  . budesonide-formoterol  2 puff Inhalation BID  . chlorhexidine  15 mL Mouth Rinse BID  .  enoxaparin (LOVENOX) injection  40 mg Subcutaneous QHS  . FLUoxetine  10 mg Oral Daily  . hyoscyamine  0.375 mg Oral BID  . [START ON 08/06/2014] levothyroxine  25 mcg Oral QAC breakfast  . pantoprazole  40 mg Oral BID  . potassium chloride SA  20 mEq Oral Daily  . pramipexole  1 mg Oral QHS  . predniSONE  5 mg Oral Q breakfast    Continuous Infusions: . sodium chloride 50 mL/hr at 08/05/14 1308    Past Medical History  Diagnosis Date  . Hypertension   . Hyperlipemia   . Asthma   . Diabetes mellitus without complication   . Asthma   . Back pain   . Seizures   . Anxiety   . Cushing disease   . GERD (gastroesophageal reflux disease) 01/24/2012  . Emphysema of  lung   . Hyperlipidemia   . Ulcer   . Hemorrhoid     Past Surgical History  Procedure Laterality Date  . 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 Colon surgery      colon perforation  . Appendectomy    . Candida esophagitis    . Pylorus ulcer    . [place]dominal hysterectomy      [place]     Clayton Bibles, MS, RD, LDN Pager: [phone] After Hours Pager: [phone]

## 2014-08-05 NOTE — Progress Notes (Signed)
Offsite EEG completed at [facility]. Results pending. 

## 2014-08-05 NOTE — Progress Notes (Signed)
Patient very lethargic upon arrival to the floor. Will call ICU RN to come do stroke swallow screen when patient is more awake. Will keep patient NPO.

## 2014-08-05 NOTE — Progress Notes (Signed)
Pt passed Stroke Swallow Screen with no difficulty.  Pt able to drink full cup of water with and without straw with no issues.  Pt also able to chew and swallow cracker with no issues.  Lung sounds unchanged pre and post.  Advised bedside RN patient had passed swallow screen.  Stroke Swallow Screen form completed and placed in shadow chart.  Isaiah Blakes (ICU Rapid Response Nurse)

## 2014-08-05 NOTE — Progress Notes (Signed)
Clinical Social Work Department CLINICAL SOCIAL WORK PSYCHIATRY SERVICE LINE ASSESSMENT 08/05/2014  Patient:  Monica Copeland  Account:  [id number]  Admit Date:  08/04/2014  Clinical Social Worker:  Sindy Messing, LCSW  Date/Time:  08/05/2014 03:30 PM Referred by:  Physician  Date referred:  08/05/2014 Reason for Referral  Psychosocial assessment   Presenting Symptoms/Problems (In the person's/family's own words):   Psych consulted due to capacity   Abuse/Neglect/Trauma History (check all that apply)  Denies history   Abuse/Neglect/Trauma Comments:   Psychiatric History (check all that apply)  Denies history   Psychiatric medications:  Prozac 10 mg   Current Mental Health Hospitalizations/Previous Mental Health History:   Patient denies any MH concerns.   Current provider:   None currently.   Place and Date:   N/A   Current Medications:   Scheduled Meds:      . antiseptic oral rinse  7 mL Mouth Rinse q12n4p  . atorvastatin  40 mg Oral q1800  . budesonide-formoterol  2 puff Inhalation BID  . chlorhexidine  15 mL Mouth Rinse BID  . enoxaparin (LOVENOX) injection  40 mg Subcutaneous QHS  . FLUoxetine  10 mg Oral Daily  . hyoscyamine  0.375 mg Oral BID  . [START ON 08/06/2014] levothyroxine  25 mcg Oral QAC breakfast  . pantoprazole  40 mg Oral BID  . potassium chloride SA  20 mEq Oral Daily  . pramipexole  1 mg Oral QHS  . predniSONE  5 mg Oral Q breakfast        Continuous Infusions:      . sodium chloride 50 mL/hr at 08/05/14 1308          PRN Meds:.albuterol, ALPRAZolam, cyclobenzaprine       Previous Impatient Admission/Date/Reason:   None reported   Emotional Health / Current Symptoms    Suicide/Self Harm  None reported   Suicide attempt in the past:   No SI or HI.   Other harmful behavior:   None reported   Psychotic/Dissociative Symptoms  Confusion   Other Psychotic/Dissociative Symptoms:   Family reports that patient has intermittent  confusion at home. Sons report that patient was really confused yesterday but has improved greatly today.    Attention/Behavioral Symptoms  Within Normal Limits   Other Attention / Behavioral Symptoms:   Patient engaged during assessment.    Cognitive Impairment  Within Normal Limits   Other Cognitive Impairment:   Patient alert and oriented during assessment.    Mood and Adjustment  Mood Congruent    Stress, Anxiety, Trauma, Any Recent Loss/Stressor  None reported   Anxiety (frequency):   N/A   Phobia (specify):   N/A   Compulsive behavior (specify):   N/A   Obsessive behavior (specify):   N/A   Other:   N/A   Substance Abuse/Use  None   SBIRT completed (please refer for detailed history):  N  Self-reported substance use:   No substance use.   Urinary Drug Screen Completed:  Y Alcohol level:   N/A    Environmental/Housing/Living Arrangement  Stable housing   Who is in the home:   Alone   Emergency contact:  [place]  [employer]   Patient's Strengths and Goals (patient's own words):   Patient has supportive family.   Clinical Social Worker's Interpretive Summary:   CSW received referral to complete psychosocial assessment. CSW reviewed chart and met with patient, two sons and psych MD at bedside.    Patient laying in  bed and agreeable to assessment and introduced her sons. Patient reports she lives at home alone and does well with cooking and cleaning on her own. Patient reports that children assist her as needed and she has two sons and one daughter that lives locally. Patient reports she is unsure why she came to the hospital but knows she has lots of medical problems that are addressed by her PCP.    Patient completed mini mental status exam (MMSE) with psych MD and sons were able to provide further information. Sons report that someone checks on her daily and assists with managing her medications. Sons are happy that patient has  shown improvement and is not as confused as she was yesterday. Sons report patient plans on returning home and family will offer assistance as needed.    CSW staffed case with psych MD and will continue to follow to assist as needed.   Disposition:  Recommend Psych CSW continuing to support while in hospital   [place], [place] [phone]

## 2014-08-05 NOTE — Plan of Care (Signed)
Problem: Phase I Progression Outcomes Goal: Pain controlled with appropriate interventions Outcome: Completed/Met Date Met:  08/05/14 Goal: OOB as tolerated unless otherwise ordered Outcome: Completed/Met Date Met:  08/05/14 Goal: Initial discharge plan identified Outcome: Completed/Met Date Met:  08/05/14 Goal: Voiding-avoid urinary catheter unless indicated Outcome: Not Applicable Date Met:  46/19/01 Goal: Hemodynamically stable Outcome: Progressing Goal: Other Phase I Outcomes/Goals Outcome: Completed/Met Date Met:  08/05/14

## 2014-08-05 NOTE — Consult Note (Signed)
[name]   Reason for Consult:  AMS and capacity evaluation Referring Physician:  Dr. Marguerite Copeland is an 73 y.o. female. Total Time spent with patient: 45 minutes  Assessment: AXIS I:  Depressive Disorder NOS AXIS II:  Deferred AXIS III:   Past Medical History  Diagnosis Date  . Hypertension   . Hyperlipemia   . Asthma   . Diabetes mellitus without complication   . Asthma   . Back pain   . Seizures   . Anxiety   . Cushing disease   . GERD (gastroesophageal reflux disease) 01/24/2012  . Emphysema of lung   . Hyperlipidemia   . Ulcer   . Hemorrhoid    AXIS IV:  other psychosocial or environmental problems, problems related to social environment and problems with primary support group AXIS V:  51-60 moderate symptoms  Plan: Case discussed with the patient, social worker and patient 2 sons at bedside Patient has capacity to make her own medical decision and living arrangements Recommended no psychotropic medication management. No evidence of imminent risk to self or others at present.   Patient does not meet criteria for psychiatric inpatient admission. Supportive therapy provided about ongoing stressors.  Appreciate psychiatric consultation Please contact [phone] or [phone] if needs further assistance   Subjective:   Monica Copeland is a 73 y.o. female patient admitted with AMS and capacity evaluation.  HPI: Patient seen and chart reviewed along with Monica Messing, LCSW and case discussed with patient's son Monica Copeland and Monica Copeland who were at bedside. Patient is a 73 years old female who lives in the [place] by herself and take care of herself independently and her family supportive to her. Patient has no history of mental illness. Patient has recent problem with memory and forgetfulness. Patient confusion at admission was got better and consider probably mixup with her medication. Patient has a brother who was diagnosed with dementia  and passed Monica Copeland recently. Patient has good orientation, language skills, fund of knowledge and fade immediate memory 2 out of 3 but poor delayed memory 1 out of 3. Patient has been taking multiple medication and was able to remember some of this medication and that reason she is taking appropriately. Patient has a fair insight, judgment and impulse control. Patient family reported she has been closely supervised about her medication administration.  Medical history: Monica Copeland is a 73 y.o. 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 As per patient's family patient has been having diarrhea last month for many days and was treated empirically with medications name of which is not clearly known. Patient's diarrhea has recently improved. Patient was recently placed on tramadol for pain relief for her abdominal discomfort. Patient medications are closely monitored by patient's family and they stated that there was no oral medication by the patient. Today morning patient was found naked and confused at her house and patient's son brought her to the ER. CT head did not show anything acute and plan was to get MRI of the brain. But patient was very agitated and had to be given initially Ativan followed by Haldol. On exam patient presently is quite sedated but arousable. As per the family patient did not have any chest pain or shortness of breath nausea vomiting but did complain of some right flank pain.  Patient did not have any fever chills and has been afebrile in the ER and chest x-ray and UA has been unremarkable. On exam patient does not have any neck rigidity.   Review of Systems: As presented in the history of presenting illness, rest negative.  Past Psychiatric History: Past Medical History  Diagnosis Date   . Hypertension   . Hyperlipemia   . Asthma   . Diabetes mellitus without complication   . Asthma   . Back pain   . Seizures   . Anxiety   . Cushing disease   . GERD (gastroesophageal reflux disease) 01/24/2012  . Emphysema of lung   . Hyperlipidemia   . Ulcer   . Hemorrhoid     reports that she has been smoking Cigarettes.  She has been smoking about 0.75 packs per day. She has never used smokeless tobacco. She reports that she does not drink alcohol or use illicit drugs. Family History  Problem Relation Age of Onset  . Hypertension Mother   . Hypertension Brother   . Heart attack Mother   . Diabetes Father   . Asthma Father   . Asthma Son   . Lung cancer Father   . Esophageal cancer Brother   . Liver cancer Brother     mets from esophagus  . Colon cancer Neg Hx      Living Arrangements: Alone   Abuse/Neglect [facility]) Physical Abuse: Denies (unable to assess, patient has AMS) Verbal Abuse: Denies Sexual Abuse: Denies Allergies:   Allergies  Allergen Reactions  . Tomato Rash    ACT Assessment Complete:  No  Objective: Blood pressure 121/54, pulse 44, temperature 97.6 F (36.4 C), temperature source Oral, resp. rate 18, height 4' 9" (1.448 m), weight 54.795 kg (120 lb 12.8 oz), SpO2 98 %.Body mass index is 26.13 kg/(m^2). Results for orders placed or performed during the hospital encounter of 08/04/14 (from the past 72 hour(s))  CBG monitoring, ED     Status: None   Collection Time: 08/04/14 12:20 PM  Result Value Ref Range   Glucose-Capillary 91 70 - 99 mg/dL  Comprehensive metabolic panel     Status: Abnormal   Collection Time: 08/04/14  1:00 PM  Result Value Ref Range   Sodium 138 137 - 147 mEq/L   Potassium 3.7 3.7 - 5.3 mEq/L   Chloride 97 96 - 112 mEq/L   CO2 25 19 - 32 mEq/L   Glucose, Bld 99 70 - 99 mg/dL   BUN 13 6 - 23 mg/dL   Creatinine, Ser 1.08 0.50 - 1.10 mg/dL   Calcium 9.3 8.4 - 10.5 mg/dL   Total Protein 7.2 6.0 - 8.3 g/dL   Albumin 3.5  3.5 - 5.2 g/dL   AST 36 0 - 37 U/L    Comment: SLIGHT HEMOLYSIS HEMOLYSIS AT THIS LEVEL MAY AFFECT RESULT    ALT 25 0 - 35 U/L   Alkaline Phosphatase 78 39 - 117 U/L   Total Bilirubin 0.4 0.3 - 1.2 mg/dL   GFR calc non Af Amer 50 (L) >90 mL/min   GFR calc Af Amer 58 (L) >90 mL/min    Comment: (NOTE) The eGFR has been calculated using the CKD EPI equation. This calculation has not been validated in all clinical situations. eGFR's persistently <90 mL/min signify possible Chronic Kidney Disease.    Anion gap 16 (H) 5 - 15  CBC     Status: None   Collection Time: 08/04/14  1:00 PM  Result Value  Ref Range   WBC 9.5 4.0 - 10.5 K/uL   RBC 5.08 3.87 - 5.11 MIL/uL   Hemoglobin 14.8 12.0 - 15.0 g/dL   HCT 43.5 36.0 - 46.0 %   MCV 85.6 78.0 - 100.0 fL   MCH 29.1 26.0 - 34.0 pg   MCHC 34.0 30.0 - 36.0 g/dL   RDW 13.6 11.5 - 15.5 %   Platelets 217 150 - 400 K/uL  Urinalysis, Routine w reflex microscopic     Status: Abnormal   Collection Time: 08/04/14  1:06 PM  Result Value Ref Range   Color, Urine AMBER (A) YELLOW    Comment: BIOCHEMICALS MAY BE AFFECTED BY COLOR   APPearance CLEAR CLEAR   Specific Gravity, Urine 1.022 1.005 - 1.030   pH 5.0 5.0 - 8.0   Glucose, UA NEGATIVE NEGATIVE mg/dL   Hgb urine dipstick NEGATIVE NEGATIVE   Bilirubin Urine SMALL (A) NEGATIVE   Ketones, ur NEGATIVE NEGATIVE mg/dL   Protein, ur NEGATIVE NEGATIVE mg/dL   Urobilinogen, UA 0.2 0.0 - 1.0 mg/dL   Nitrite NEGATIVE NEGATIVE   Leukocytes, UA NEGATIVE NEGATIVE    Comment: MICROSCOPIC NOT DONE ON URINES WITH NEGATIVE PROTEIN, BLOOD, LEUKOCYTES, NITRITE, OR GLUCOSE <1000 mg/dL.  Urine rapid drug screen (hosp performed)     Status: Abnormal   Collection Time: 08/04/14  1:06 PM  Result Value Ref Range   Opiates NONE DETECTED NONE DETECTED   Cocaine NONE DETECTED NONE DETECTED   Benzodiazepines POSITIVE (A) NONE DETECTED   Amphetamines NONE DETECTED NONE DETECTED   Tetrahydrocannabinol NONE DETECTED  NONE DETECTED   Barbiturates POSITIVE (A) NONE DETECTED    Comment:        DRUG SCREEN FOR MEDICAL PURPOSES ONLY.  IF CONFIRMATION IS NEEDED FOR ANY PURPOSE, NOTIFY LAB WITHIN 5 DAYS.        LOWEST DETECTABLE LIMITS FOR URINE DRUG SCREEN Drug Class       Cutoff (ng/mL) Amphetamine      1000 Barbiturate      200 Benzodiazepine   106 Tricyclics       269 Opiates          300 Cocaine          300 THC              50   Blood gas, arterial     Status: Abnormal   Collection Time: 08/04/14  9:30 PM  Result Value Ref Range   FIO2 0.21 %   pH, Arterial 7.421 7.350 - 7.450   pCO2 arterial 41.5 35.0 - 45.0 mmHg   pO2, Arterial 72.1 (L) 80.0 - 100.0 mmHg   Bicarbonate 26.5 (H) 20.0 - 24.0 mEq/L   TCO2 23.1 0 - 100 mmol/L   Acid-Base Excess 2.3 (H) 0.0 - 2.0 mmol/L   O2 Saturation 94.6 %   Patient temperature 98.7    Collection site BRACHIAL ARTERY    Drawn by AMY RAY, RRT    Sample type ARTERIAL DRAW   Carboxyhemoglobin     Status: Abnormal   Collection Time: 08/04/14  9:30 PM  Result Value Ref Range   Total hemoglobin 14.3 12.0 - 16.0 g/dL   O2 Saturation 94.7 %   Carboxyhemoglobin 1.4 0.5 - 1.5 %   Methemoglobin 2.6 (H) 0.0 - 1.5 %  Ammonia     Status: Abnormal   Collection Time: 08/04/14  9:41 PM  Result Value Ref Range   Ammonia 10 (L) 11 - 60 umol/L  CK  Status: None   Collection Time: 08/04/14  9:42 PM  Result Value Ref Range   Total CK 118 7 - 177 U/L  Troponin I     Status: None   Collection Time: 08/04/14  9:42 PM  Result Value Ref Range   Troponin I <0.30 <0.30 ng/mL    Comment:        Due to the release kinetics of cTnI, a negative result within the first hours of the onset of symptoms does not rule out myocardial infarction with certainty. If myocardial infarction is still suspected, repeat the test at appropriate intervals.   Glucose, capillary     Status: Abnormal   Collection Time: 08/04/14 11:14 PM  Result Value Ref Range   Glucose-Capillary 114  (H) 70 - 99 mg/dL   Comment 1 Documented in Chart    Comment 2 Notify RN   TSH     Status: Abnormal   Collection Time: 08/05/14  4:56 AM  Result Value Ref Range   TSH 0.055 (L) 0.350 - 4.500 uIU/mL    Comment: Performed at [facility]  Comprehensive metabolic panel     Status: Abnormal   Collection Time: 08/05/14  4:56 AM  Result Value Ref Range   Sodium 139 137 - 147 mEq/L   Potassium 3.9 3.7 - 5.3 mEq/L   Chloride 101 96 - 112 mEq/L   CO2 24 19 - 32 mEq/L   Glucose, Bld 127 (H) 70 - 99 mg/dL   BUN 13 6 - 23 mg/dL   Creatinine, Ser 0.95 0.50 - 1.10 mg/dL   Calcium 8.8 8.4 - 10.5 mg/dL   Total Protein 6.6 6.0 - 8.3 g/dL   Albumin 3.1 (L) 3.5 - 5.2 g/dL   AST 25 0 - 37 U/L   ALT 19 0 - 35 U/L   Alkaline Phosphatase 68 39 - 117 U/L   Total Bilirubin 0.5 0.3 - 1.2 mg/dL   GFR calc non Af Amer 58 (L) >90 mL/min   GFR calc Af Amer 67 (L) >90 mL/min    Comment: (NOTE) The eGFR has been calculated using the CKD EPI equation. This calculation has not been validated in all clinical situations. eGFR's persistently <90 mL/min signify possible Chronic Kidney Disease.    Anion gap 14 5 - 15  CBC with Differential     Status: Abnormal   Collection Time: 08/05/14  4:56 AM  Result Value Ref Range   WBC 5.2 4.0 - 10.5 K/uL   RBC 4.88 3.87 - 5.11 MIL/uL   Hemoglobin 14.0 12.0 - 15.0 g/dL   HCT 41.9 36.0 - 46.0 %   MCV 85.9 78.0 - 100.0 fL   MCH 28.7 26.0 - 34.0 pg   MCHC 33.4 30.0 - 36.0 g/dL   RDW 13.9 11.5 - 15.5 %   Platelets 213 150 - 400 K/uL   Neutrophils Relative % 79 (H) 43 - 77 %   Neutro Abs 4.1 1.7 - 7.7 K/uL   Lymphocytes Relative 19 12 - 46 %   Lymphs Abs 1.0 0.7 - 4.0 K/uL   Monocytes Relative 2 (L) 3 - 12 %   Monocytes Absolute 0.1 0.1 - 1.0 K/uL   Eosinophils Relative 0 0 - 5 %   Eosinophils Absolute 0.0 0.0 - 0.7 K/uL   Basophils Relative 0 0 - 1 %   Basophils Absolute 0.0 0.0 - 0.1 K/uL  Glucose, capillary     Status: Abnormal   Collection Time:  08/05/14  7:42  AM  Result Value Ref Range   Glucose-Capillary 114 (H) 70 - 99 mg/dL   Labs are reviewed .  Current Facility-Administered Medications  Medication Dose Route Frequency Provider Last Rate Last Dose  . 0.9 %  sodium chloride infusion   Intravenous Continuous Robbie Lis, MD 50 mL/hr at 08/05/14 1308    . albuterol (PROVENTIL) (2.5 MG/3ML) 0.083% nebulizer solution 3 mL  3 mL Inhalation Q6H PRN Rise Patience, MD      . ALPRAZolam Duanne Moron) tablet 0.5 mg  0.5 mg Oral BID PRN Rise Patience, MD      . antiseptic oral rinse (CPC / CETYLPYRIDINIUM CHLORIDE 0.05%) solution 7 mL  7 mL Mouth Rinse q12n4p Rise Patience, MD   7 mL at 08/05/14 1200  . atorvastatin (LIPITOR) tablet 40 mg  40 mg Oral q1800 Rise Patience, MD      . budesonide-formoterol (SYMBICORT) 80-4.5 MCG/ACT inhaler 2 puff  2 puff Inhalation BID Rise Patience, MD   2 puff at 08/05/14 0805  . chlorhexidine (PERIDEX) 0.12 % solution 15 mL  15 mL Mouth Rinse BID Rise Patience, MD   15 mL at 08/05/14 1024  . cyclobenzaprine (FLEXERIL) tablet 5 mg  5 mg Oral TID PRN Rise Patience, MD   5 mg at 08/05/14 1308  . enoxaparin (LOVENOX) injection 40 mg  40 mg Subcutaneous QHS Rise Patience, MD   40 mg at 08/04/14 2333  . FLUoxetine (PROZAC) capsule 10 mg  10 mg Oral Daily Rise Patience, MD   10 mg at 08/05/14 1023  . hyoscyamine (LEVBID) 0.375 MG 12 hr tablet 0.375 mg  0.375 mg Oral BID Rise Patience, MD   0.375 mg at 08/05/14 1023  . [START ON 08/06/2014] levothyroxine (SYNTHROID, LEVOTHROID) tablet 25 mcg  25 mcg Oral QAC breakfast Robbie Lis, MD      . pantoprazole (PROTONIX) EC tablet 40 mg  40 mg Oral BID Rise Patience, MD   40 mg at 08/05/14 1023  . potassium chloride SA (K-DUR,KLOR-CON) CR tablet 20 mEq  20 mEq Oral Daily Rise Patience, MD   20 mEq at 08/05/14 1023  . pramipexole (MIRAPEX) tablet 1 mg  1 mg Oral QHS Rise Patience, MD   1 mg at  08/04/14 2315  . predniSONE (DELTASONE) tablet 5 mg  5 mg Oral Q breakfast Rise Patience, MD   5 mg at 08/05/14 1023    Psychiatric Specialty Exam: Physical Exam  ROS  Blood pressure 121/54, pulse 44, temperature 97.6 F (36.4 C), temperature source Oral, resp. rate 18, height 4' 9" (1.448 m), weight 54.795 kg (120 lb 12.8 oz), SpO2 98 %.Body mass index is 26.13 kg/(m^2).  General Appearance: Casual  Eye Contact::  Good  Speech:  Clear and Coherent  Volume:  Decreased  Mood:  Anxious  Affect:  Appropriate and Congruent  Thought Process:  Coherent and Goal Directed  Orientation:  Full (Time, Place, and Person)  Thought Content:  WDL  Suicidal Thoughts:  No  Homicidal Thoughts:  No  Memory:  Immediate;   Fair Recent;   Poor  Judgement:  Intact  Insight:  Fair  Psychomotor Activity:  Decreased  Concentration:  Good  Recall:  Good  Fund of Knowledge:Good  Language: Good  Akathisia:  NA  Handed:  Right  AIMS (if indicated):     Assets:  Communication Skills Desire for Improvement Financial Resources/Insurance Housing Intimacy Leisure Time  Resilience Social Support Talents/Skills  Sleep:      Musculoskeletal: Strength & Muscle Tone: within normal limits Gait & Station: unable to stand Patient leans: N/A  Treatment Plan Summary: Daily contact with patient to assess and evaluate symptoms and progress in treatment Medication management  Monica Copeland,JANARDHAHA R. 08/05/2014 1:28 PM

## 2014-08-05 NOTE — Progress Notes (Signed)
Patient ID: Monica Copeland, female   DOB: 25-Sep-1940, 73 y.o.   MRN: [id number] [facility] PROGRESS NOTE  MYLIE MCCURLEY PPJ:[id number] DOB: Oct 07, 1940 DOA: 08/04/2014 PCP: Garnet Koyanagi, DO  Brief narrative:    73 y.o. 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 Per patient's son, patient was last seen in normal 2 days prior to the admission and went to dinner with her friend. Patient's son is not sure which medications his mom is taking. On the admission, CT head did not show acute intracranial findings. Chest x-ray did not show acute cardiopulmonary disease, urinalysis did not reveal signs of infection. Urine drug screen was positive for benzodiazepines and barbiturates. Patient was admitted for further evaluation of acute encephalopathy.   Assessment/Plan:    Principal Problem:   Acute encephalopathy - Unclear etiology. The thought was that she may have adrenal insufficiency so she was put on stress dose steroids on the admission. - No evidence of pneumonia or UTI based on chest x-ray and urinalysis respectively - CT head showed no acute intracranial findings - Per patient's son, mental status seems a little better but patient is still confused. No reports of hallucination. - Psychiatry consult requested, appreciate their recommendations. Active Problems:   Cushing syndrome / Pituitary insufficiency  - Check cortisol level. Continue prednisone per prior home dose. - Patient is on thyroid supplementation as well. TSH is low at 0.055. We decreased the dose of Synthroid from 50 mcg to 25 mcg daily.  Depression - Continue Prozac 10 mg daily  Dyslipidemia - Continue statin therapy daily   GERD - continue PPI therapy   DVT  Prophylaxis  - Lovenox subcutaneous ordered  Code Status: Full.  Family Communication:  plan of care discussed with the patient's son at the bedside Disposition Plan: Home when stable.   IV access:   Peripheral IV  Procedures and diagnostic studies:    Dg Chest 2 View 08/04/2014    No acute cardiopulmonary disease.   Electronically Signed   By: Arne Cleveland M.D.   On: 08/04/2014 17:53   Ct Head Wo Contrast 08/04/2014    Atrophy with small vessel chronic ischemic changes of deep cerebral white matter.  No acute intracranial abnormalities.   Electronically Signed   By: Lavonia Dana M.D.   On: 08/04/2014 19:40    Medical Consultants:   Psychiatry  Other Consultants:   Physical therapy  Occupational therapy  IAnti-Infectives:    None   Leisa Lenz, MD  [facility] Pager [phone]  If 7PM-7AM, please contact night-coverage [email or web address] Password [email or web address] 08/05/2014, 12:09 PM   LOS: 1 day    HPI/Subjective: No acute overnight events.  Objective: Filed Vitals:   08/04/14 2227 08/05/14 0330 08/05/14 0613 08/05/14 0806  BP: 112/69 119/54 121/54   Pulse: 42 48 44   Temp: 97.4 F (36.3 C) 97.7 F (36.5 C) 97.6 F (36.4 C)   TempSrc: Axillary Oral Oral   Resp: 16 18 18    Height: 4\' 9"  (1.448 m)     Weight: 54.795 kg (120 lb 12.8 oz)     SpO2: 99% 98% 100% 98%    Intake/Output Summary (Last 24 hours) at 08/05/14 1209 Last data filed at 08/05/14 0500  Gross per 24 hour  Intake 911.25 ml  Output  450 ml  Net 461.25 ml    Exam:   General:  Pt is alert, confused, no distress  Cardiovascular: Regular rate and rhythm, S1/S2, no murmurs  Respiratory: Clear to auscultation bilaterally, no wheezing, no crackles, no rhonchi  Abdomen: Soft, non tender, non distended, bowel sounds present  Extremities: No edema, pulses DP and PT palpable bilaterally  Neuro: Grossly nonfocal  Data Reviewed: Basic Metabolic Panel:  Recent Labs Lab 08/04/14 1300  08/05/14 0456  NA 138 139  K 3.7 3.9  CL 97 101  CO2 25 24  GLUCOSE 99 127*  BUN 13 13  CREATININE 1.08 0.95  CALCIUM 9.3 8.8   Liver Function Tests:  Recent Labs Lab 08/04/14 1300 08/05/14 0456  AST 36 25  ALT 25 19  ALKPHOS 78 68  BILITOT 0.4 0.5  PROT 7.2 6.6  ALBUMIN 3.5 3.1*   No results for input(s): LIPASE, AMYLASE in the last 168 hours.  Recent Labs Lab 08/04/14 2141  AMMONIA 10*   CBC:  Recent Labs Lab 08/04/14 1300 08/05/14 0456  WBC 9.5 5.2  NEUTROABS  --  4.1  HGB 14.8 14.0  HCT 43.5 41.9  MCV 85.6 85.9  PLT 217 213   Cardiac Enzymes:  Recent Labs Lab 08/04/14 2142  CKTOTAL 118  TROPONINI <0.30   BNP: Invalid input(s): POCBNP CBG:  Recent Labs Lab 08/04/14 1220 08/04/14 2314 08/05/14 0742  GLUCAP 91 114* 114*    Recent Results (from the past 240 hour(s))  Urine Culture     Status: None   Collection Time: 07/28/14  5:55 PM  Result Value Ref Range Status   Colony Count NO GROWTH  Final   Organism ID, Bacteria NO GROWTH  Final     Scheduled Meds: . atorvastatin  40 mg Oral q1800  . budesonide-formoterol  2 puff Inhalation BID  . enoxaparin (LOVENOX) injection  40 mg Subcutaneous QHS  . FLUoxetine  10 mg Oral Daily  . hydrocortisone sod succinate (SOLU-CORTEF) inj  50 mg Intravenous Q6H  . hyoscyamine  0.375 mg Oral BID  . levothyroxine  50 mcg Oral QAC breakfast  . pantoprazole  40 mg Oral BID  . potassium chloride SA  20 mEq Oral Daily  . pramipexole  1 mg Oral QHS  . predniSONE  5 mg Oral Q breakfast   Continuous Infusions: . sodium chloride 75 mL/hr at 08/04/14 2315

## 2014-08-06 DIAGNOSIS — E440 Moderate protein-calorie malnutrition: Secondary | ICD-10-CM

## 2014-08-06 LAB — GLUCOSE, CAPILLARY
GLUCOSE-CAPILLARY: 96 mg/dL (ref 70–99)
GLUCOSE-CAPILLARY: 118 mg/dL — AB (ref 70–99)
Glucose-Capillary: 150 mg/dL — ABNORMAL HIGH (ref 70–99)

## 2014-08-06 LAB — CORTISOL: Cortisol, Plasma: 53.2 ug/dL

## 2014-08-06 MED ORDER — ENSURE COMPLETE PO LIQD: 237.0000 mL | Freq: Two times a day (BID) | ORAL | Status: AC

## 2014-08-06 MED ORDER — LEVOTHYROXINE SODIUM 25 MCG PO TABS: 25.0000 ug | ORAL_TABLET | Freq: Every day | ORAL | Status: DC

## 2014-08-06 MED ORDER — LOPERAMIDE HCL 2 MG PO CAPS
2.0000 mg | ORAL_CAPSULE | ORAL | Status: DC | PRN
  Administered 2014-08-06: 2 mg via ORAL
  Filled 2014-08-06 (×2): qty 1

## 2014-08-06 NOTE — Plan of Care (Signed)
Problem: Phase I Progression Outcomes Goal: Hemodynamically stable Outcome: Completed/Met Date Met:  08/06/14  Problem: Phase II Progression Outcomes Goal: Discharge plan established Outcome: Completed/Met Date Met:  08/06/14 Goal: Obtain order to discontinue catheter if appropriate Outcome: Not Applicable Date Met:  02/58/52 Goal: Other Phase II Outcomes/Goals Outcome: Not Applicable Date Met:  77/82/42

## 2014-08-06 NOTE — Progress Notes (Signed)
Clinical Social Work  CSW and psych MD rounded on patient. Patient laying in bed and son at bedside. Son reports that patient has improved some but does have confusion at home. Patient struggles with concentration and memory and is unable to successfully complete MMSE. CSW will continue to follow.  [place], [place] [phone]

## 2014-08-06 NOTE — Evaluation (Signed)
Occupational Therapy Evaluation Patient Details Name: Monica Copeland MRN: [id number] DOB: 09-17-1940 Today's Date: 08/06/2014    History of Present Illness 73 yo female admitted with actue encephalopathy. Pt lives alone in senior living apt   Clinical Impression   Patient evaluated by Occupational Therapy with no further acute OT needs identified. All education has been completed and the patient has no further questions. Pt presents to OT with mild balance deficits and cognitive deficits including impaired memory, impaired safety and awareness.   Son reports he will be staying with her at discharge until she is safe to be alone.   Recommend HHOT to address independence with BADLs and IADLs.  All further OT needs can be addressed by OT as pt hopeful to discharge home today.  See below for any follow-up Occupational Therapy or equipment needs. OT is signing off. Thank you for this referral.      Follow Up Recommendations  Home health OT;Supervision/Assistance - 24 hour    Equipment Recommendations  None recommended by OT    Recommendations for Other Services       Precautions / Restrictions Precautions Precautions: Fall      Mobility Bed Mobility Overal bed mobility: Modified Independent                Transfers Overall transfer level: Modified independent                    Balance Overall balance assessment: Needs assistance Sitting-balance support: No upper extremity supported       Standing balance support: During functional activity Standing balance-Leahy Scale: Good                              ADL Overall ADL's : Needs assistance/impaired Eating/Feeding: Independent   Grooming: Wash/dry hands;Brushing hair;Min guard;Standing   Upper Body Bathing: Set up;Supervision/ safety;Sitting   Lower Body Bathing: Min guard;Sit to/from stand   Upper Body Dressing : Supervision/safety;Set up;Sitting   Lower Body Dressing: Min guard;Sit  to/from stand   Toilet Transfer: Min guard;Ambulation;Comfort height toilet   Toileting- Clothing Manipulation and Hygiene: Min guard;Sit to/from stand       Functional mobility during ADLs: Min guard General ADL Comments: Son reports he will be staying with pt until she is safe to be left alone      Vision                     Perception     Praxis      Pertinent Vitals/Pain Pain Assessment: Faces Faces Pain Scale: Hurts little more Pain Location: low back pain  Pain Descriptors / Indicators: Aching Pain Intervention(s): Monitored during session     Hand Dominance     Extremity/Trunk Assessment Upper Extremity Assessment Upper Extremity Assessment: Overall WFL for tasks assessed   Lower Extremity Assessment Lower Extremity Assessment: Defer to PT evaluation   Cervical / Trunk Assessment Cervical / Trunk Assessment: Normal   Communication Communication Communication: No difficulties;Expressive difficulties (low volume - difficult to understand)   Cognition Arousal/Alertness: Awake/alert Behavior During Therapy: WFL for tasks assessed/performed Overall Cognitive Status: Impaired/Different from baseline Area of Impairment: Orientation;Safety/judgement;Awareness;Problem solving Orientation Level: Disoriented to;Place;Time;Situation       Safety/Judgement: Decreased awareness of deficits Awareness: Intellectual Problem Solving: Slow processing;Difficulty sequencing;Requires verbal cues General Comments: Pt states it is Sat (is oriented to month and year).  States it is [facility] hospital, has no awareness that  she is at [facility].  She repeatedly asks me about her medications despite being told that she needs to discuss that with her MD, and I am unsure of her meds - what she is on and why.  She continued to ask those same questions throughout the session despite this.  Pt requires cues for thoroughness during ADL activities    General Comments       Exercises        Shoulder Instructions      Home Living Family/patient expects to be discharged to:: Private residence Living Arrangements: Alone   Type of Home: Apartment Home Access: Stairs to enter Entrance Stairs-Number of Steps: 1   Home Layout: One level     Bathroom Shower/Tub: Tub/shower unit;Curtain Shower/tub characteristics: Architectural technologist: Standard     Home Equipment: Environmental consultant - 2 wheels;Electric scooter          Prior Functioning/Environment Level of Independence: Independent with assistive device(s)        Comments: Son confirms that pt was independent with all ADLs.  She does not drive, but cooks and cleans.  She has a small dog and manages his care and walks him daily.  Daughter puts meds in med box daily/weekly.   Pt denies falls.  Takes tub baths     OT Diagnosis: Generalized weakness;Cognitive deficits   OT Problem List: Decreased strength;Decreased activity tolerance;Impaired balance (sitting and/or standing);Decreased cognition;Decreased safety awareness   OT Treatment/Interventions:      OT Goals(Current goals can be found in the care plan section) Acute Rehab OT Goals Patient Stated Goal: to go home  OT Goal Formulation: All assessment and education complete, DC therapy  OT Frequency:     Barriers to D/C:            Co-evaluation              End of Session Nurse Communication: Mobility status  Activity Tolerance: Patient tolerated treatment well Patient left: in bed;with call bell/phone within reach;with family/visitor present   Time: 0998-3382 OT Time Calculation (min): 35 min Charges:  OT General Charges $OT Visit: 1 Procedure OT Evaluation $Initial OT Evaluation Tier I: 1 Procedure OT Treatments $Self Care/Home Management : 8-22 mins $Therapeutic Activity: 8-22 mins G-Codes:    Monica Copeland M August 19, 2014, 10:42 AM

## 2014-08-06 NOTE — Progress Notes (Signed)
Physical Therapy Treatment Patient Details Name: Monica Copeland MRN: [id number] DOB: 07/24/1941 Today's Date: 08/06/2014    History of Present Illness 73 yo female admitted with actue encephalopathy. Pt lives alone in senior living apt    PT Comments    Assisted pt OOB to amb a great distance in hallway.  Assisted to BR then back to bed.  Amb without AD, did well.  Pt eager to D/C to home.    Follow Up Recommendations  Home health PT     Equipment Recommendations  None recommended by PT    Recommendations for Other Services       Precautions / Restrictions Precautions Precautions: Fall Restrictions Weight Bearing Restrictions: No    Mobility  Bed Mobility Overal bed mobility: Modified Independent             General bed mobility comments: in and out bed  Transfers Overall transfer level: Modified independent                  Ambulation/Gait Ambulation/Gait assistance: Supervision Ambulation Distance (Feet): 250 Feet Assistive device: None Gait Pattern/deviations: Step-through pattern Gait velocity: WFL   General Gait Details: amb without AD this session.  NO LOB and good alternating gait.   Stairs            Wheelchair Mobility    Modified Rankin (Stroke Patients Only)       Balance Overall balance assessment: Needs assistance Sitting-balance support: No upper extremity supported       Standing balance support: During functional activity Standing balance-Leahy Scale: Good                      Cognition Arousal/Alertness: Awake/alert Behavior During Therapy: WFL for tasks assessed/performed Overall Cognitive Status: Impaired/Different from baseline Area of Impairment: Orientation;Safety/judgement;Awareness;Problem solving Orientation Level: Disoriented to;Place;Time;Situation       Safety/Judgement: Decreased awareness of deficits Awareness: Intellectual Problem Solving: Slow processing;Difficulty  sequencing;Requires verbal cues General Comments: Pt states it is Sat (is oriented to month and year).  States it is [facility] hospital, has no awareness that she is at [facility].  She repeatedly asks me about her medications despite being told that she needs to discuss that with her MD, and I am unsure of her meds - what she is on and why.  She continued to ask those same questions throughout the session despite this.  Pt requires cues for thoroughness during ADL activities     Exercises      General Comments        Pertinent Vitals/Pain Pain Assessment: Faces Faces Pain Scale: Hurts a little bit Pain Location: low back pain Pain Descriptors / Indicators: Aching;Constant Pain Intervention(s): Monitored during session    Home Living Family/patient expects to be discharged to:: Private residence Living Arrangements: Alone   Type of Home: Apartment Home Access: Stairs to enter   Home Layout: One level Home Equipment: Environmental consultant - 2 wheels;Electric scooter      Prior Function Level of Independence: Independent with assistive device(s)      Comments: Son confirms that pt was independent with all ADLs.  She does not drive, but cooks and cleans.  She has a small dog and manages his care and walks him daily.  Daughter puts meds in med box daily/weekly.   Pt denies falls.  Takes tub baths    PT Goals (current goals can now be found in the care plan section) Acute Rehab PT Goals Patient Stated Goal: to  go home  Progress towards PT goals: Progressing toward goals    Frequency  Min 3X/week    PT Plan      Co-evaluation             End of Session Equipment Utilized During Treatment: Gait belt Activity Tolerance: Patient tolerated treatment well Patient left: in bed;with call bell/phone within reach;with family/visitor present     Time: 1151-1220 PT Time Calculation (min) (ACUTE ONLY): 29 min  Charges:  $Gait Training: 8-22 mins $Therapeutic Activity: 8-22 mins                    G  Codes:       Rica Koyanagi  PTA [facility]  Acute  Rehab Pager      [phone]

## 2014-08-06 NOTE — Discharge Summary (Addendum)
Physician Discharge Summary  Monica Copeland WYO:[id number] DOB: [birth year] DOA: 08/04/2014  PCP: Garnet Koyanagi, DO  Admit date: 08/04/2014 Discharge date: 08/06/2014  Recommendations for Outpatient Follow-up:  1. TSH level needs to be recheck in 4 weeks. Synthroid dose lowered to 25 mcg daily since TSH level on this admission low at 0.055 2. Stool for C. difficile was positive on this admission. Patient will be discharged with Flagyl for 10 days on discharge. 3. Please note protonix stopped and using Pepcid instead   Discharge Diagnoses:  Principal Problem:   Acute encephalopathy Active Problems:   Cushing syndrome   Pituitary insufficiency   Malnutrition of moderate degree    Discharge Condition: stable   Diet recommendation: as tolerated   History of present illness:  73 y.o. 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 Per patient's son, patient was last seen in normal 2 days prior to the admission and went to dinner with her friend. Patient's son is not sure which medications his mom is taking. On the admission, CT head did not show acute intracranial findings. Chest x-ray did not show acute cardiopulmonary disease, urinalysis did not reveal signs of infection. Urine drug screen was positive for benzodiazepines and barbiturates. Patient was admitted for further evaluation of acute encephalopathy.   Assessment/Plan:    Principal Problem:  Acute encephalopathy - Unclear etiology. The thought was that she may have adrenal insufficiency so she was put on stress dose steroids on the admission. - cortisol level was WNL Pt will continue on discharge her usual prednisone dosage she takes for adrenal insufficiency. - No evidence of  pneumonia or UTI based on chest x-ray and urinalysis respectively - CT head showed no acute intracranial findings - Per patient's son, mental status much better this morning.  - Psychiatry consult requested, appreciate their recommendations. They said patient has a capacity to make living arrangements and medical treatment decisions.  Active Problems:   C. difficile positive - Stool for C.diff positive so will go home with Flagyl for 10 days on discharge.  Cushing syndrome / Pituitary insufficiency  - Cortisol level WNL. Continue prednisone per prior home dose. - Patient is on thyroid supplementation as well. TSH is low at 0.055. We decreased the dose of Synthroid from 50 mcg to 25 mcg daily. Depression - Continue Prozac 10 mg daily   Moderate malnutrition - Pt meets criteria for moderate malnutrition in the context of acute illness as evidenced by 17% wt loss x 6 months and energy intake <75% for >7 days.  Dyslipidemia - Continue statin therapy daily  GERD - continue PPI therapy   DVT Prophylaxis  - Lovenox subcutaneous ordered while pt is in hospital   Code Status: Full.  Family Communication: plan of care discussed with the patient's son at the bedside   IV access:   Peripheral IV  Procedures and diagnostic studies:   Dg Chest 2 View 08/04/2014 No acute cardiopulmonary disease. Electronically Signed By: Arne Cleveland M.D. On: 08/04/2014 17:53   Ct Head Wo Contrast 08/04/2014 Atrophy with small vessel chronic ischemic changes of deep cerebral white matter. No acute intracranial abnormalities. Electronically Signed By: Lavonia Dana M.D. On: 08/04/2014 19:40    Medical Consultants:   Psychiatry  Other Consultants:   Physical therapy  Occupational therapy  IAnti-Infectives:    None   Signed:  Leisa Lenz, MD  [facility] 08/06/2014, 3:51 PM  Pager #: [phone]   Discharge Exam: Filed Vitals:   08/06/14  1000  BP: 108/50  Pulse: 76  Temp: 97.9 F (36.6 C)  Resp: 18   Filed Vitals:   08/06/14 0203 08/06/14 0520 08/06/14 0732 08/06/14 1000  BP: 99/56 100/59  108/50  Pulse: 63 60  76  Temp: 98.1 F (36.7 C) 98 F (36.7 C)  97.9 F (36.6 C)  TempSrc: Oral Oral  Axillary  Resp: 18 18  18   Height:      Weight:      SpO2: 99% 97% 95% 97%    General: Pt is alert, follows commands appropriately, not in acute distress Cardiovascular: Regular rate and rhythm, S1/S2 +, no murmurs Respiratory: Clear to auscultation bilaterally, no wheezing, no crackles, no rhonchi Abdominal: Soft, non tender, non distended, bowel sounds +, no guarding Extremities: no edema, no cyanosis, pulses palpable bilaterally DP and PT Neuro: Grossly nonfocal  Discharge Instructions  Discharge Instructions    Call MD for:  difficulty breathing, headache or visual disturbances    Complete by:  As directed      Call MD for:  persistant dizziness or light-headedness    Complete by:  As directed      Call MD for:  persistant nausea and vomiting    Complete by:  As directed      Call MD for:  severe uncontrolled pain    Complete by:  As directed      Diet - low sodium heart healthy    Complete by:  As directed      Increase activity slowly    Complete by:  As directed             Medication List    STOP taking these medications        pantoprazole 40 MG tablet  Commonly known as:  PROTONIX     traMADol 50 MG tablet  Commonly known as:  ULTRAM      TAKE these medications        alendronate 70 MG tablet  Commonly known as:  FOSAMAX  Take 1 tablet (70 mg total) by mouth every 7 (seven) days. Take with a full glass of water on an empty stomach.     ALPRAZolam 0.5 MG tablet  Commonly known as:  XANAX  Take 1 tablet (0.5 mg total) by mouth 2 (two) times daily as needed for sleep.     atorvastatin 40 MG tablet  Commonly known as:  LIPITOR  TAKE 1 TABLET BY MOUTH DAILY     budesonide-formoterol  80-4.5 MCG/ACT inhaler  Commonly known as:  SYMBICORT  Inhale 2 puffs into the lungs as needed.     cyclobenzaprine 10 MG tablet  Commonly known as:  FLEXERIL  TAKE 1 TABLET (10 MG TOTAL) BY MOUTH 3 (THREE) TIMES DAILY AS NEEDED FOR MUSCLE SPASMS. No more med's until seen by provider     famotidine 20 MG tablet  Commonly known as:  PEPCID  Take 1 tablet (20 mg total) by mouth 2 (two) times daily.     feeding supplement (ENSURE COMPLETE) Liqd  Take 237 mLs by mouth 2 (two) times daily between meals.     ferrous sulfate 325 (65 FE) MG tablet  TAKE 1 TABLET BY MOUTH TWICE DAILY BEFORE LUNCH AND SUPPER     FLUoxetine 10 MG capsule  Commonly known  as:  PROZAC  Take 1 capsule (10 mg total) by mouth daily.     hyoscyamine 0.375 MG 12 hr tablet  Commonly known as:  LEVBID  Take 1 tablet (0.375 mg total) by mouth 2 (two) times daily.     levothyroxine 25 MCG tablet  Commonly known as:  SYNTHROID, LEVOTHROID  Take 1 tablet (25 mcg total) by mouth daily before breakfast.     levothyroxine 25 MCG tablet  Commonly known as:  SYNTHROID, LEVOTHROID  Take 1 tablet (25 mcg total) by mouth daily before breakfast.     metroNIDAZOLE 500 MG tablet  Commonly known as:  FLAGYL  Take 1 tablet (500 mg total) by mouth every 8 (eight) hours.     potassium chloride SA 20 MEQ tablet  Commonly known as:  K-DUR,KLOR-CON  Take 1 tablet (20 mEq total) by mouth daily.     pramipexole 1 MG tablet  Commonly known as:  MIRAPEX  TAKE 1 TABLET BY MOUTH EVERY NIGHT AT BEDTIME     predniSONE 5 MG tablet  Commonly known as:  DELTASONE  TAKE 1 TABLET BY MOUTH ONCE DAILY.     PROAIR HFA 108 (90 BASE) MCG/ACT inhaler  Generic drug:  albuterol  Inhale 1-2 puffs into the lungs every 6 (six) hours as needed for wheezing or shortness of breath.               Follow-up Information    Follow up with Garnet Koyanagi, DO. Schedule an appointment as soon as possible for a visit in 1 week.   Specialty:  Family  Medicine   Why:  Follow up appt after recent hospitalization   Contact information:   [address] [place] [address] [phone]        The results of significant diagnostics from this hospitalization (including imaging, microbiology, ancillary and laboratory) are listed below for reference.    Significant Diagnostic Studies: Dg Chest 2 View  08/04/2014   CLINICAL DATA:  Pt very confused today per family. Hx diabetes and asthma. Hx htn  EXAM: CHEST - 2 VIEW  COMPARISON:  02/07/2013  FINDINGS: Lungs are clear. Heart size and mediastinal contours are within normal limits. No effusion. Visualized skeletal structures are unremarkable.  IMPRESSION: No acute cardiopulmonary disease.   Electronically Signed   By: Arne Cleveland M.D.   On: 08/04/2014 17:53   Ct Head Wo Contrast  08/04/2014   CLINICAL DATA:  Altered mental status, hypertension, hyperlipidemia, diabetes, asthma, smoker  EXAM: CT HEAD WITHOUT CONTRAST  TECHNIQUE: Contiguous axial images were obtained from the base of the skull through the vertex without intravenous contrast.  COMPARISON:  04/24/2012  FINDINGS: Minimal atrophy.  Normal ventricular morphology.  No midline shift or mass effect.  Mild small vessel chronic ischemic changes of deep cerebral white matter.  No intracranial hemorrhage, mass lesion or evidence acute infarction.  No extra-axial fluid collections.  Chronic sphenoid sinus opacification.  Atherosclerotic calcifications at the carotid siphons.  Bones demineralized.  IMPRESSION: Atrophy with small vessel chronic ischemic changes of deep cerebral white matter.  No acute intracranial abnormalities.   Electronically Signed   By: Lavonia Dana M.D.   On: 08/04/2014 19:40   Mr Brain Wo Contrast (neuro Protocol)  08/05/2014   CLINICAL DATA:  Altered mental status. Encephalopathy. Diabetes and hypertension and hyperlipidemia. Smoker.  EXAM: MRI HEAD WITHOUT CONTRAST  TECHNIQUE: Multiplanar, multiecho pulse  sequences of the brain and surrounding structures were obtained without intravenous  contrast.  COMPARISON:  CT head 08/04/2014  FINDINGS: Image quality degraded by moderate motion.  Negative for acute infarct.  Generalized atrophy with prominent ventricles and subarachnoid space diffusely. Mild chronic microvascular ischemic change in the white matter and pons. No cortical infarct  Negative for mass or edema.  No mass effect or midline shift.  Negative for cerebral hemorrhage.  Mucosal edema in the sphenoid sinus.  IMPRESSION: Atrophy and mild chronic microvascular ischemic change. No acute abnormality.   Electronically Signed   By: Franchot Gallo M.D.   On: 08/05/2014 12:21   Ct Abdomen Pelvis W Contrast  07/17/2014   CLINICAL DATA:  Left lower quadrant pain. Symptoms not relieved with antibiotics. Pain has been intermittent for 3 weeks. Diarrhea but no vomiting.  EXAM: CT ABDOMEN AND PELVIS WITH CONTRAST  TECHNIQUE: Multidetector CT imaging of the abdomen and pelvis was performed using the standard protocol following bolus administration of intravenous contrast.  CONTRAST:  57mL OMNIPAQUE IOHEXOL 300 MG/ML SOLN, 190mL OMNIPAQUE IOHEXOL 300 MG/ML SOLN  COMPARISON:  08/02/2007  FINDINGS: Lung bases are clear.  Negative for free intraperitoneal air.  Low-density in the liver is suggestive for steatosis. No focal liver lesion and there is a normal appearance of the gallbladder and portal venous system. Normal appearance of the pancreas, spleen, adrenal glands and kidneys. Small low-density structures involving the right kidney likely represent small cysts. There is wall thickening in the gastric antrum and pyloric region. This finding is similar to the previous examination. There is no inflammation surrounding the pylorus. Contrast within the stomach and small bowel. No significant free fluid or lymphadenopathy.  The uterus has been removed. Small amount of fluid in the urinary bladder. Suspect bowel anastomotic  clips in the sigmoid colon. No acute inflammatory changes in the bowel. Normal appearance of the terminal ileum and the appendix is not clearly identified.  Small defect along the left lateral abdominal wall likely from previous surgery. Small ventral hernia containing fat. Degenerative changes in lumbar spine. No acute bone abnormality.  IMPRESSION: No acute abnormality within the abdomen or pelvis.  Question hepatic steatosis.  Small abdominal wall hernias or defects. No evidence for bowel involvement.   Electronically Signed   By: Markus Daft M.D.   On: 07/17/2014 14:25    Microbiology: Recent Results (from the past 240 hour(s))  Urine Culture     Status: None   Collection Time: 07/28/14  5:55 PM  Result Value Ref Range Status   Colony Count NO GROWTH  Final   Organism ID, Bacteria NO GROWTH  Final     Labs: Basic Metabolic Panel:  Recent Labs Lab 08/04/14 1300 08/05/14 0456  NA 138 139  K 3.7 3.9  CL 97 101  CO2 25 24  GLUCOSE 99 127*  BUN 13 13  CREATININE 1.08 0.95  CALCIUM 9.3 8.8   Liver Function Tests:  Recent Labs Lab 08/04/14 1300 08/05/14 0456  AST 36 25  ALT 25 19  ALKPHOS 78 68  BILITOT 0.4 0.5  PROT 7.2 6.6  ALBUMIN 3.5 3.1*   No results for input(s): LIPASE, AMYLASE in the last 168 hours.  Recent Labs Lab 08/04/14 2141  AMMONIA 10*   CBC:  Recent Labs Lab 08/04/14 1300 08/05/14 0456  WBC 9.5 5.2  NEUTROABS  --  4.1  HGB 14.8 14.0  HCT 43.5 41.9  MCV 85.6 85.9  PLT 217 213   Cardiac Enzymes:  Recent Labs Lab 08/04/14 2142  CKTOTAL 118  TROPONINI <  0.30   BNP: BNP (last 3 results) No results for input(s): PROBNP in the last 8760 hours. CBG:  Recent Labs Lab 08/05/14 0742 08/05/14 1648 08/05/14 2115 08/06/14 0742 08/06/14 1257  GLUCAP 114* 79 115* 96 150*    Time coordinating discharge: Over 30 minutes

## 2014-08-06 NOTE — Consult Note (Signed)
Psychiatry Consult follow up note  Reason for Consult:  AMS and capacity evaluation Referring Physician:  Dr. Marguerite Olea is an 73 y.o. female. Total Time spent with patient: 20 minutes  Assessment: AXIS I:  Depressive Disorder NOS AXIS II:  Deferred AXIS III:   Past Medical History  Diagnosis Date  . Hypertension   . Hyperlipemia   . Asthma   . Diabetes mellitus without complication   . Asthma   . Back pain   . Seizures   . Anxiety   . Cushing disease   . GERD (gastroesophageal reflux disease) 01/24/2012  . Emphysema of lung   . Hyperlipidemia   . Ulcer   . Hemorrhoid    AXIS IV:  other psychosocial or environmental problems, problems related to social environment and problems with primary support group AXIS V:  51-60 moderate symptoms  Plan: Case discussed with the patient, social worker and patient son at bedside Patient has capacity to make her own medical decision and living arrangements Recommended no psychotropic medication management. No evidence of imminent risk to self or others at present.   Patient does not meet criteria for psychiatric inpatient admission. Supportive therapy provided about ongoing stressors.  Appreciate psychiatric consultation Please contact [phone] or [phone] if needs further assistance   Subjective:   Monica Copeland is a 73 y.o. female patient admitted with AMS and capacity evaluation.  HPI: Patient seen and chart reviewed along with Sindy Messing, LCSW and case discussed with patient's son Legrand Como and [name] who were at bedside. Patient is a 73 years old female who lives in the [place] by herself and take care of herself independently and her family supportive to her. Patient has no history of mental illness. Patient has recent problem with memory and forgetfulness. Patient confusion at admission was got better and consider probably mixup with her medication. Patient has a brother who was diagnosed with dementia and  passed Shanon Brow recently. Patient has good orientation, language skills, fund of knowledge and fade immediate memory 2 out of 3 but poor delayed memory 1 out of 3. Patient has been taking multiple medication and was able to remember some of this medication and that reason she is taking appropriately. Patient has a fair insight, judgment and impulse control. Patient family reported she has been closely supervised about her medication administration.  Interval history: Patient was seen for psychiatric consultation follow-up with the Sindy Messing, LCSW. Patient continued to have confusion and difficulty with the concentration and memory difficulties. Patient has immediate 2 out of 3 and her 0 - 1 out of 3 today. Patient son reported she has better cognitive functions at home. Patient seems like disappointed herself and not able to recall her memory and stated that she is feeling like she is dumb, but she continued to be cooperative with this evaluation  Medical history: Monica Copeland is a 73 y.o. 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 As per patient's family patient has been having diarrhea last month for many days and was treated empirically with medications name of which is not clearly known. Patient's diarrhea has recently improved. Patient was recently placed on tramadol for pain relief for her abdominal discomfort. Patient medications are closely monitored by patient's family and they stated that there was no oral medication by the patient.  Today morning patient was found naked and confused at her house and patient's son brought her to the ER. CT head did not show anything acute and plan was to get MRI of the brain. But patient was very agitated and had to be given initially Ativan followed by Haldol. On exam  patient presently is quite sedated but arousable. As per the family patient did not have any chest pain or shortness of breath nausea vomiting but did complain of some right flank pain. Patient did not have any fever chills and has been afebrile in the ER and chest x-ray and UA has been unremarkable. On exam patient does not have any neck rigidity.   Review of Systems: As presented in the history of presenting illness, rest negative.  Past Psychiatric History: Past Medical History  Diagnosis Date  . Hypertension   . Hyperlipemia   . Asthma   . Diabetes mellitus without complication   . Asthma   . Back pain   . Seizures   . Anxiety   . Cushing disease   . GERD (gastroesophageal reflux disease) 01/24/2012  . Emphysema of lung   . Hyperlipidemia   . Ulcer   . Hemorrhoid     reports that she has been smoking Cigarettes.  She has been smoking about 0.75 packs per day. She has never used smokeless tobacco. She reports that she does not drink alcohol or use illicit drugs. Family History  Problem Relation Age of Onset  . Hypertension Mother   . Hypertension Brother   . Heart attack Mother   . Diabetes Father   . Asthma Father   . Asthma Son   . Lung cancer Father   . Esophageal cancer Brother   . Liver cancer Brother     mets from esophagus  . Colon cancer Neg Hx      Living Arrangements: Alone   Abuse/Neglect [facility]) Physical Abuse: Denies (unable to assess, patient has AMS) Verbal Abuse: Denies Sexual Abuse: Denies Allergies:   Allergies  Allergen Reactions  . Tomato Rash    ACT Assessment Complete:  No  Objective: Blood pressure 108/50, pulse 76, temperature 97.9 F (36.6 C), temperature source Axillary, resp. rate 18, height 4\' 9"  (1.448 m), weight 54.795 kg (120 lb 12.8 oz), SpO2 97 %.Body mass index is 26.13 kg/(m^2). Results for orders placed or performed during the hospital encounter of 08/04/14 (from the past 72 hour(s))  CBG monitoring, ED     Status: None    Collection Time: 08/04/14 12:20 PM  Result Value Ref Range   Glucose-Capillary 91 70 - 99 mg/dL  Comprehensive metabolic panel     Status: Abnormal   Collection Time: 08/04/14  1:00 PM  Result Value Ref Range   Sodium 138 137 - 147 mEq/L   Potassium 3.7 3.7 - 5.3 mEq/L   Chloride 97 96 - 112 mEq/L   CO2 25 19 - 32 mEq/L   Glucose, Bld 99 70 - 99 mg/dL   BUN 13 6 - 23 mg/dL   Creatinine, Ser 14/08/15 0.50 - 1.10 mg/dL   Calcium 9.3 8.4 - 2.29 mg/dL   Total Protein 7.2 6.0 - 8.3 g/dL   Albumin 3.5 3.5 - 5.2 g/dL   AST 36 0 - 37 U/L    Comment: SLIGHT HEMOLYSIS HEMOLYSIS AT THIS LEVEL MAY AFFECT RESULT    ALT 25 0 - 35 U/L   Alkaline Phosphatase 78 39 - 117 U/L   Total Bilirubin 0.4 0.3 - 1.2 mg/dL  GFR calc non Af Amer 50 (L) >90 mL/min   GFR calc Af Amer 58 (L) >90 mL/min    Comment: (NOTE) The eGFR has been calculated using the CKD EPI equation. This calculation has not been validated in all clinical situations. eGFR's persistently <90 mL/min signify possible Chronic Kidney Disease.    Anion gap 16 (H) 5 - 15  CBC     Status: None   Collection Time: 08/04/14  1:00 PM  Result Value Ref Range   WBC 9.5 4.0 - 10.5 K/uL   RBC 5.08 3.87 - 5.11 MIL/uL   Hemoglobin 14.8 12.0 - 15.0 g/dL   HCT 43.5 36.0 - 46.0 %   MCV 85.6 78.0 - 100.0 fL   MCH 29.1 26.0 - 34.0 pg   MCHC 34.0 30.0 - 36.0 g/dL   RDW 13.6 11.5 - 15.5 %   Platelets 217 150 - 400 K/uL  Urinalysis, Routine w reflex microscopic     Status: Abnormal   Collection Time: 08/04/14  1:06 PM  Result Value Ref Range   Color, Urine AMBER (A) YELLOW    Comment: BIOCHEMICALS MAY BE AFFECTED BY COLOR   APPearance CLEAR CLEAR   Specific Gravity, Urine 1.022 1.005 - 1.030   pH 5.0 5.0 - 8.0   Glucose, UA NEGATIVE NEGATIVE mg/dL   Hgb urine dipstick NEGATIVE NEGATIVE   Bilirubin Urine SMALL (A) NEGATIVE   Ketones, ur NEGATIVE NEGATIVE mg/dL   Protein, ur NEGATIVE NEGATIVE mg/dL   Urobilinogen, UA 0.2 0.0 - 1.0 mg/dL    Nitrite NEGATIVE NEGATIVE   Leukocytes, UA NEGATIVE NEGATIVE    Comment: MICROSCOPIC NOT DONE ON URINES WITH NEGATIVE PROTEIN, BLOOD, LEUKOCYTES, NITRITE, OR GLUCOSE <1000 mg/dL.  Urine rapid drug screen (hosp performed)     Status: Abnormal   Collection Time: 08/04/14  1:06 PM  Result Value Ref Range   Opiates NONE DETECTED NONE DETECTED   Cocaine NONE DETECTED NONE DETECTED   Benzodiazepines POSITIVE (A) NONE DETECTED   Amphetamines NONE DETECTED NONE DETECTED   Tetrahydrocannabinol NONE DETECTED NONE DETECTED   Barbiturates POSITIVE (A) NONE DETECTED    Comment:        DRUG SCREEN FOR MEDICAL PURPOSES ONLY.  IF CONFIRMATION IS NEEDED FOR ANY PURPOSE, NOTIFY LAB WITHIN 5 DAYS.        LOWEST DETECTABLE LIMITS FOR URINE DRUG SCREEN Drug Class       Cutoff (ng/mL) Amphetamine      1000 Barbiturate      200 Benzodiazepine   397 Tricyclics       673 Opiates          300 Cocaine          300 THC              50   Blood gas, arterial     Status: Abnormal   Collection Time: 08/04/14  9:30 PM  Result Value Ref Range   FIO2 0.21 %   pH, Arterial 7.421 7.350 - 7.450   pCO2 arterial 41.5 35.0 - 45.0 mmHg   pO2, Arterial 72.1 (L) 80.0 - 100.0 mmHg   Bicarbonate 26.5 (H) 20.0 - 24.0 mEq/L   TCO2 23.1 0 - 100 mmol/L   Acid-Base Excess 2.3 (H) 0.0 - 2.0 mmol/L   O2 Saturation 94.6 %   Patient temperature 98.7    Collection site BRACHIAL ARTERY    Drawn by AMY RAY, RRT    Sample type ARTERIAL DRAW   Carboxyhemoglobin  Status: Abnormal   Collection Time: 08/04/14  9:30 PM  Result Value Ref Range   Total hemoglobin 14.3 12.0 - 16.0 g/dL   O2 Saturation 94.7 %   Carboxyhemoglobin 1.4 0.5 - 1.5 %   Methemoglobin 2.6 (H) 0.0 - 1.5 %  Ammonia     Status: Abnormal   Collection Time: 08/04/14  9:41 PM  Result Value Ref Range   Ammonia 10 (L) 11 - 60 umol/L  CK     Status: None   Collection Time: 08/04/14  9:42 PM  Result Value Ref Range   Total CK 118 7 - 177 U/L  Troponin I      Status: None   Collection Time: 08/04/14  9:42 PM  Result Value Ref Range   Troponin I <0.30 <0.30 ng/mL    Comment:        Due to the release kinetics of cTnI, a negative result within the first hours of the onset of symptoms does not rule out myocardial infarction with certainty. If myocardial infarction is still suspected, repeat the test at appropriate intervals.   Glucose, capillary     Status: Abnormal   Collection Time: 08/04/14 11:14 PM  Result Value Ref Range   Glucose-Capillary 114 (H) 70 - 99 mg/dL   Comment 1 Documented in Chart    Comment 2 Notify RN   TSH     Status: Abnormal   Collection Time: 08/05/14  4:56 AM  Result Value Ref Range   TSH 0.055 (L) 0.350 - 4.500 uIU/mL    Comment: Performed at [facility]  Comprehensive metabolic panel     Status: Abnormal   Collection Time: 08/05/14  4:56 AM  Result Value Ref Range   Sodium 139 137 - 147 mEq/L   Potassium 3.9 3.7 - 5.3 mEq/L   Chloride 101 96 - 112 mEq/L   CO2 24 19 - 32 mEq/L   Glucose, Bld 127 (H) 70 - 99 mg/dL   BUN 13 6 - 23 mg/dL   Creatinine, Ser 0.95 0.50 - 1.10 mg/dL   Calcium 8.8 8.4 - 10.5 mg/dL   Total Protein 6.6 6.0 - 8.3 g/dL   Albumin 3.1 (L) 3.5 - 5.2 g/dL   AST 25 0 - 37 U/L   ALT 19 0 - 35 U/L   Alkaline Phosphatase 68 39 - 117 U/L   Total Bilirubin 0.5 0.3 - 1.2 mg/dL   GFR calc non Af Amer 58 (L) >90 mL/min   GFR calc Af Amer 67 (L) >90 mL/min    Comment: (NOTE) The eGFR has been calculated using the CKD EPI equation. This calculation has not been validated in all clinical situations. eGFR's persistently <90 mL/min signify possible Chronic Kidney Disease.    Anion gap 14 5 - 15  CBC with Differential     Status: Abnormal   Collection Time: 08/05/14  4:56 AM  Result Value Ref Range   WBC 5.2 4.0 - 10.5 K/uL   RBC 4.88 3.87 - 5.11 MIL/uL   Hemoglobin 14.0 12.0 - 15.0 g/dL   HCT 41.9 36.0 - 46.0 %   MCV 85.9 78.0 - 100.0 fL   MCH 28.7 26.0 - 34.0 pg   MCHC 33.4 30.0  - 36.0 g/dL   RDW 13.9 11.5 - 15.5 %   Platelets 213 150 - 400 K/uL   Neutrophils Relative % 79 (H) 43 - 77 %   Neutro Abs 4.1 1.7 - 7.7 K/uL   Lymphocytes Relative 19 12 -  46 %   Lymphs Abs 1.0 0.7 - 4.0 K/uL   Monocytes Relative 2 (L) 3 - 12 %   Monocytes Absolute 0.1 0.1 - 1.0 K/uL   Eosinophils Relative 0 0 - 5 %   Eosinophils Absolute 0.0 0.0 - 0.7 K/uL   Basophils Relative 0 0 - 1 %   Basophils Absolute 0.0 0.0 - 0.1 K/uL  Glucose, capillary     Status: Abnormal   Collection Time: 08/05/14  7:42 AM  Result Value Ref Range   Glucose-Capillary 114 (H) 70 - 99 mg/dL  Cortisol     Status: None   Collection Time: 08/05/14 12:39 PM  Result Value Ref Range   Cortisol, Plasma 53.2 ug/dL    Comment: (NOTE) AM:  4.3 - 22.4 ug/dL PM:  3.1 - 16.7 ug/dL Performed at [facility]   Glucose, capillary     Status: None   Collection Time: 08/05/14  4:48 PM  Result Value Ref Range   Glucose-Capillary 79 70 - 99 mg/dL  Glucose, capillary     Status: Abnormal   Collection Time: 08/05/14  9:15 PM  Result Value Ref Range   Glucose-Capillary 115 (H) 70 - 99 mg/dL  Glucose, capillary     Status: None   Collection Time: 08/06/14  7:42 AM  Result Value Ref Range   Glucose-Capillary 96 70 - 99 mg/dL  Glucose, capillary     Status: Abnormal   Collection Time: 08/06/14 12:57 PM  Result Value Ref Range   Glucose-Capillary 150 (H) 70 - 99 mg/dL  Glucose, capillary     Status: Abnormal   Collection Time: 08/06/14  4:23 PM  Result Value Ref Range   Glucose-Capillary 118 (H) 70 - 99 mg/dL   Labs are reviewed .  Current Facility-Administered Medications  Medication Dose Route Frequency Provider Last Rate Last Dose  . albuterol (PROVENTIL) (2.5 MG/3ML) 0.083% nebulizer solution 3 mL  3 mL Inhalation Q6H PRN Rise Patience, MD      . ALPRAZolam Duanne Moron) tablet 0.5 mg  0.5 mg Oral BID PRN Rise Patience, MD   0.5 mg at 08/05/14 2146  . antiseptic oral rinse (CPC /  CETYLPYRIDINIUM CHLORIDE 0.05%) solution 7 mL  7 mL Mouth Rinse q12n4p Rise Patience, MD   7 mL at 08/05/14 1200  . atorvastatin (LIPITOR) tablet 40 mg  40 mg Oral q1800 Rise Patience, MD   40 mg at 08/05/14 1838  . budesonide-formoterol (SYMBICORT) 80-4.5 MCG/ACT inhaler 2 puff  2 puff Inhalation BID Rise Patience, MD   2 puff at 08/06/14 0730  . chlorhexidine (PERIDEX) 0.12 % solution 15 mL  15 mL Mouth Rinse BID Rise Patience, MD   15 mL at 08/05/14 2147  . cyclobenzaprine (FLEXERIL) tablet 5 mg  5 mg Oral TID PRN Rise Patience, MD   5 mg at 08/05/14 1308  . enoxaparin (LOVENOX) injection 40 mg  40 mg Subcutaneous QHS Rise Patience, MD   40 mg at 08/05/14 2146  . feeding supplement (ENSURE COMPLETE) (ENSURE COMPLETE) liquid 237 mL  237 mL Oral BID BM Clayton Bibles, RD   237 mL at 08/06/14 1349  . FLUoxetine (PROZAC) capsule 10 mg  10 mg Oral Daily Rise Patience, MD   10 mg at 08/06/14 1057  . hyoscyamine (LEVBID) 0.375 MG 12 hr tablet 0.375 mg  0.375 mg Oral BID Rise Patience, MD   0.375 mg at 08/06/14 1057  . levothyroxine (SYNTHROID,  LEVOTHROID) tablet 25 mcg  25 mcg Oral QAC breakfast Robbie Lis, MD   25 mcg at 08/06/14 1057  . pantoprazole (PROTONIX) EC tablet 40 mg  40 mg Oral BID Rise Patience, MD   40 mg at 08/06/14 1057  . potassium chloride SA (K-DUR,KLOR-CON) CR tablet 20 mEq  20 mEq Oral Daily Rise Patience, MD   20 mEq at 08/06/14 1057  . pramipexole (MIRAPEX) tablet 1 mg  1 mg Oral QHS Rise Patience, MD   1 mg at 08/05/14 2146  . predniSONE (DELTASONE) tablet 5 mg  5 mg Oral Q breakfast Rise Patience, MD   5 mg at 08/06/14 1057    Psychiatric Specialty Exam: Physical Exam  ROS  Blood pressure 108/50, pulse 76, temperature 97.9 F (36.6 C), temperature source Axillary, resp. rate 18, height $RemoveBe'4\' 9"'JynoNyHyx$  (1.448 m), weight 54.795 kg (120 lb 12.8 oz), SpO2 97 %.Body mass index is 26.13 kg/(m^2).  General Appearance:  Casual  Eye Contact::  Good  Speech:  Clear and Coherent  Volume:  Decreased  Mood:  Anxious  Affect:  Appropriate and Congruent  Thought Process:  Coherent and Goal Directed  Orientation:  Full (Time, Place, and Person)  Thought Content:  WDL  Suicidal Thoughts:  No  Homicidal Thoughts:  No  Memory:  Immediate;   Fair Recent;   Poor  Judgement:  Intact  Insight:  Fair  Psychomotor Activity:  Decreased  Concentration:  Good  Recall:  Good  Fund of Knowledge:Good  Language: Good  Akathisia:  NA  Handed:  Right  AIMS (if indicated):     Assets:  Communication Skills Desire for Improvement Financial Resources/Insurance Housing Intimacy Leisure Time Resilience Social Support Talents/Skills  Sleep:      Musculoskeletal: Strength & Muscle Tone: within normal limits Gait & Station: unable to stand Patient leans: N/A  Treatment Plan Summary: Daily contact with patient to assess and evaluate symptoms and progress in treatment Medication management  Rajinder Mesick,JANARDHAHA R. 08/06/2014 5:00 PM

## 2014-08-06 NOTE — Discharge Instructions (Signed)

## 2014-08-06 NOTE — Clinical Documentation Improvement (Signed)
Possible Clinical Conditions?  Moderate Malnutrition   Protein Calorie Malnutrition Severe Protein Calorie Malnutrition Other Condition Cannot clinically determine  Supporting Information:INITIAL NUTRITION ASSESSMENT 08-05-14 by Clayton Bibles RD  (moderate) malnutrition in the context of acute illness or injury   Pt meets criteria for moderate MALNUTRITION in the context of acute illness as evidenced by 17% wt loss x 6 months and energy intake <75% for >7 days.   INTERVENTION:  -Provide Ensure Complete po BID, each supplement provides 350 kcal and 13 grams of protein  -Encourage PO intake  -RD to continue to monitor    Thank You, Alessandra Grout, RN, BSN, CCDS,Clinical Documentation Specialist:  [phone]  [phone]=Cell - Health Information Management

## 2014-08-06 NOTE — Progress Notes (Signed)
Informed by RN that patient's family did not feel patient is stable for discharge. Apparently she started to have diarrhea. Per RN, pt only with loose stool and not diarrhea.  Will observe for 24 hours, get stool for C.diff. D/C likely in am. Leisa Lenz [facility] [phone]

## 2014-08-07 LAB — CLOSTRIDIUM DIFFICILE BY PCR: CDIFFPCR: POSITIVE — AB

## 2014-08-07 LAB — GLUCOSE, CAPILLARY
Glucose-Capillary: 124 mg/dL — ABNORMAL HIGH (ref 70–99)
Glucose-Capillary: 129 mg/dL — ABNORMAL HIGH (ref 70–99)

## 2014-08-07 MED ORDER — FAMOTIDINE 20 MG PO TABS: 20.0000 mg | ORAL_TABLET | Freq: Two times a day (BID) | ORAL | Status: DC

## 2014-08-07 MED ORDER — METRONIDAZOLE 500 MG PO TABS
500.0000 mg | ORAL_TABLET | Freq: Three times a day (TID) | ORAL | Status: DC
  Administered 2014-08-07: 500 mg via ORAL
  Filled 2014-08-07: qty 1

## 2014-08-07 MED ORDER — METRONIDAZOLE 500 MG PO TABS: 500.0000 mg | ORAL_TABLET | Freq: Three times a day (TID) | ORAL | Status: DC

## 2014-08-07 NOTE — Progress Notes (Signed)
Patient had 2-3 episodes of diarrhea/loose stool. We analyzed the stool for C. difficile and it was positive. Patient was started on Flagyl which she will take on discharge for 10 days. This was communicated to the patient and her family at the bedside. The son at the bedside understands that patient is stable for discharge because she tolerates by mouth intake and is able to take Flagyl by mouth. Home health orders are in place. Leisa Lenz [facility] [phone]

## 2014-08-07 NOTE — Progress Notes (Signed)
Clinical Social Work  Patient has been evaluated by psych who determined she has capacity to make her own decisions. Per chart review, patient to DC home with [facility]. CSW is signing off but available if needed.  [place], [place] [phone]

## 2014-08-10 ENCOUNTER — Other Ambulatory Visit: Payer: Self-pay

## 2014-08-10 ENCOUNTER — Telehealth: Payer: Self-pay

## 2014-08-10 NOTE — Telephone Encounter (Signed)
Admit date: 08/04/2014  Discharge date: 08/06/2014   Reason for admission: Acute encephalopathy  Recommendations for Outpatient Follow-up:  1. TSH level needs to be recheck in 4 weeks. Synthroid dose lowered to 25 mcg daily since TSH level on this admission low at 0.055 2. Stool for C. difficile was positive on this admission. Patient will be discharged with Flagyl for 10 days on discharge. 3. Please note protonix stopped and using Pepcid instead   Information below provided by patient's daughter, Oletta Cohn  Transition Care Management Follow-up Telephone Call  How have you been since you were released from the hospital?  Daughter states that mother is doing better.  Pt is home, but has sitters and family to check on her. Taking medications as prescribed per daughter.   Do you understand why you were in the hospital? yes   Do you understand the discharge instructions? yes  Items Reviewed:  Medications reviewed: yes  Allergies reviewed: yes  Dietary changes reviewed: yes, pt is drink ensure per daughter   Referrals reviewed:  n/a   Functional Questionnaire:   Activities of Daily Living (ADLs):   She states they are independent in the following: requires assistance States they require assistance with the following: requires assistance   Any transportation issues/concerns?: no, daughter provides transporation   Any patient concerns? no   Confirmed importance and date/time of follow-up visits scheduled: yes   Confirmed with patient if condition begins to worsen call PCP or go to the ER:  Yes  Hospital follow up appointment scheduled with Dr. Etter Sjogren on 08/13/14 @ 11:30 am.

## 2014-08-10 NOTE — Telephone Encounter (Signed)
Admit date: 08/04/2014 Discharge date: 08/06/2014  Reason for admission:  Acute encephalopathy  Called patient.  No answer.  Called daughter, Oletta Cohn.  No answer.  Left message on phone for call back.

## 2014-08-13 ENCOUNTER — Encounter: Payer: Self-pay | Admitting: Family Medicine

## 2014-08-13 ENCOUNTER — Ambulatory Visit (INDEPENDENT_AMBULATORY_CARE_PROVIDER_SITE_OTHER): Payer: Medicare Other | Admitting: Family Medicine

## 2014-08-13 VITALS — BP 114/68 | HR 96 | Temp 97.9°F | Wt 120.0 lb

## 2014-08-13 DIAGNOSIS — M546 Pain in thoracic spine: Secondary | ICD-10-CM

## 2014-08-13 DIAGNOSIS — A047 Enterocolitis due to Clostridium difficile: Secondary | ICD-10-CM

## 2014-08-13 DIAGNOSIS — F411 Generalized anxiety disorder: Secondary | ICD-10-CM

## 2014-08-13 DIAGNOSIS — E785 Hyperlipidemia, unspecified: Secondary | ICD-10-CM

## 2014-08-13 DIAGNOSIS — E039 Hypothyroidism, unspecified: Secondary | ICD-10-CM

## 2014-08-13 DIAGNOSIS — A0472 Enterocolitis due to Clostridium difficile, not specified as recurrent: Secondary | ICD-10-CM

## 2014-08-13 MED ORDER — MELOXICAM 7.5 MG PO TABS: 7.5000 mg | ORAL_TABLET | Freq: Every day | ORAL | Status: DC

## 2014-08-13 MED ORDER — FLUOXETINE HCL 20 MG PO TABS: 20.0000 mg | ORAL_TABLET | Freq: Every day | ORAL | Status: DC

## 2014-08-13 MED ORDER — CYCLOBENZAPRINE HCL 10 MG PO TABS: ORAL_TABLET | ORAL | Status: DC

## 2014-08-13 MED ORDER — ATORVASTATIN CALCIUM 40 MG PO TABS: ORAL_TABLET | ORAL | Status: DC

## 2014-08-13 MED ORDER — ALPRAZOLAM 0.5 MG PO TABS: ORAL_TABLET | ORAL | Status: DC

## 2014-08-13 NOTE — Progress Notes (Signed)
Pre visit review using our clinic review tool, if applicable. No additional management support is needed unless otherwise documented below in the visit note. 

## 2014-08-14 ENCOUNTER — Telehealth: Payer: Self-pay | Admitting: Family Medicine

## 2014-08-14 NOTE — Telephone Encounter (Signed)
emmi mailed  °

## 2014-08-15 ENCOUNTER — Other Ambulatory Visit: Payer: Self-pay | Admitting: Family Medicine

## 2014-08-15 DIAGNOSIS — F411 Generalized anxiety disorder: Secondary | ICD-10-CM

## 2014-08-16 ENCOUNTER — Encounter: Payer: Self-pay | Admitting: Family Medicine

## 2014-08-16 NOTE — Progress Notes (Signed)
Subjective:    Patient ID: Miki Kins, female    DOB: 1940-09-29, 73 y.o.   MRN: [id number]  HPI Pt is here with her son to f/u hosp ---dx c diff.  She is on abx and her thyroid was abnormal as welll.  She is due to have that checke next month.  Pt is doing better at home.  Her children are helping with her meds now.  DC Summary reviewed.        Discharge Summary Notes       Discharge Summaries by Robbie Lis, MD at 08/06/2014  3:51 PM  Version 7 of 7     Author: Robbie Lis, MD Service: Internal Medicine Author Type: Physician    Filed: 08/07/2014  4:44 PM Note Time: 08/06/2014  3:51 PM Status: Addendum    Editor: Robbie Lis, MD (Physician)      Related Notes: Original Note by Robbie Lis, MD (Physician) filed at 08/07/2014  4:42 PM      Expand All Collapse All            Physician Discharge Summary     TWALA COLLINGS TAV:[id number] DOB: 12/10/1940 DOA: 08/04/2014     PCP: Garnet Koyanagi, DO     Admit date: 08/04/2014  Discharge date: 08/06/2014     Recommendations for Outpatient Follow-up:   1.TSH level needs to be recheck in 4 weeks. Synthroid dose lowered to 25 mcg daily since TSH level on this admission low at 0.055 2.Stool for C. difficile was positive on this admission. Patient will be discharged with Flagyl for 10 days on discharge. 3.Please note protonix stopped and using Pepcid instead      Discharge Diagnoses:    Principal Problem:    Acute encephalopathy  Active Problems:    Cushing syndrome    Pituitary insufficiency    Malnutrition of moderate degree          Discharge Condition: stable      Diet recommendation: as tolerated      History of present illness:   73 y.o. female with history of asthma, Cushing's disease status post pituitary surgery and on replacement prednisone and Synthroid, hyperlipidemia and ongoing tobacco abuse who presented to [facility] ED with altered mental status, more confusion  per patient's son for past 24 hours prior to this admission. Per patient's son, patient was last seen in normal 2 days prior to the admission and went to dinner with her friend. Patient's son is not sure which medications his mom is taking. On the admission, CT head did not show acute intracranial findings. Chest x-ray did not show acute cardiopulmonary disease, urinalysis did not reveal signs of infection. Urine drug screen was positive for benzodiazepines and barbiturates. Patient was admitted for further evaluation of acute encephalopathy.            Assessment/Plan:          Principal Problem:    Acute encephalopathy  - Unclear etiology. The thought was that she may have adrenal insufficiency so she was put on stress dose steroids on the admission.  - cortisol level was WNL Pt will continue on discharge her usual prednisone dosage she takes for adrenal insufficiency.  - No evidence of pneumonia or UTI based on chest x-ray and urinalysis respectively  - CT head showed no acute intracranial findings  - Per patient's son, mental status much better this morning.    - Psychiatry consult requested, appreciate their recommendations. They  said patient has a capacity to make living arrangements and medical treatment decisions.    Active Problems:    C. difficile positive  - Stool for C.diff positive so will go home with Flagyl for 10 days on discharge.    Cushing syndrome / Pituitary insufficiency   - Cortisol level WNL. Continue prednisone per prior home dose.  - Patient is on thyroid supplementation as well. TSH is low at 0.055. We decreased the dose of Synthroid from 50 mcg to 25 mcg daily.   Depression  - Continue Prozac 10 mg daily    Moderate malnutrition  - Pt meets criteria for moderate malnutrition in the context of acute illness as evidenced by 17% wt loss x 6 months and energy intake <75% for >7 days.     Dyslipidemia  - Continue statin therapy daily      GERD  - continue PPI therapy       DVT Prophylaxis    - Lovenox subcutaneous ordered while pt is in hospital       Code Status: Full.    Family Communication:  plan of care discussed with the patient's son at the bedside            IV access:     .Peripheral IV         Procedures and diagnostic studies:        Dg Chest 2 View 08/04/2014    No acute cardiopulmonary disease.   Electronically Signed   By: Arne Cleveland M.D.   On: 08/04/2014 17:53      Ct Head Wo Contrast 08/04/2014    Atrophy with small vessel chronic ischemic changes of deep cerebral white matter.  No acute intracranial abnormalities.   Electronically Signed   By: Lavonia Dana M.D.   On: 08/04/2014 19:40             Medical Consultants:     .Psychiatry         Other Consultants:     .Physical therapy .Occupational therapy         IAnti-Infectives:      .None        Signed:     Leisa Lenz, MD      [facility]  08/06/2014, 3:51 PM     Pager #: [phone]        Discharge Exam:      Filed Vitals:         08/06/14 1000     BP:    108/50     Pulse:    76     Temp:    97.9 F (36.6 C)     Resp:    18              Filed Vitals:         08/06/14 0203    08/06/14 0520    08/06/14 0732    08/06/14 1000     BP:    99/56    100/59        108/50     Pulse:    63    60        76     Temp:    98.1 F (36.7 C)    98 F (36.7 C)        97.9 F (36.6 C)     TempSrc:    Oral    Oral        Axillary  Resp:    18    18        18      Height:                     Weight:                     SpO2:    99%    97%    95%    97%          General: Pt is alert, follows commands appropriately, not in acute distress  Cardiovascular: Regular rate and rhythm, S1/S2 +, no murmurs  Respiratory: Clear to auscultation  bilaterally, no wheezing, no crackles, no rhonchi  Abdominal: Soft, non tender, non distended, bowel sounds +, no guarding  Extremities: no edema, no cyanosis, pulses palpable bilaterally DP and PT  Neuro: Grossly nonfocal     Discharge Instructions           Discharge Instructions          Call MD for:  difficulty breathing, headache or visual disturbances       Complete by:  As directed                  Call MD for:  persistant dizziness or light-headedness       Complete by:  As directed                  Call MD for:  persistant nausea and vomiting       Complete by:  As directed                  Call MD for:  severe uncontrolled pain       Complete by:  As directed                  Diet - low sodium heart healthy       Complete by:  As directed                  Increase activity slowly       Complete by:  As directed                                      Medication List          STOP taking these medications                       pantoprazole 40 MG tablet     Commonly known as:  PROTONIX                traMADol 50 MG tablet     Commonly known as:  ULTRAM                TAKE these medications                       alendronate 70 MG tablet     Commonly known as:  FOSAMAX     Take 1 tablet (70 mg total) by mouth every 7 (seven) days. Take with a full glass of water on an empty stomach.                ALPRAZolam 0.5 MG tablet     Commonly known as:  XANAX     Take 1  tablet (0.5 mg total) by mouth 2 (two) times daily as needed for sleep.                atorvastatin 40 MG tablet     Commonly known as:  LIPITOR     TAKE 1 TABLET BY MOUTH DAILY                budesonide-formoterol 80-4.5 MCG/ACT inhaler     Commonly known as:  SYMBICORT     Inhale 2 puffs into the lungs as needed.                 cyclobenzaprine 10 MG tablet     Commonly known as:  FLEXERIL     TAKE 1 TABLET (10 MG TOTAL) BY MOUTH 3 (THREE) TIMES DAILY AS NEEDED FOR MUSCLE SPASMS. No more med's until seen by provider                famotidine 20 MG tablet     Commonly known as:  PEPCID     Take 1 tablet (20 mg total) by mouth 2 (two) times daily.                feeding supplement (ENSURE COMPLETE) Liqd     Take 237 mLs by mouth 2 (two) times daily between meals.                ferrous sulfate 325 (65 FE) MG tablet     TAKE 1 TABLET BY MOUTH TWICE DAILY BEFORE LUNCH AND SUPPER                FLUoxetine 10 MG capsule     Commonly known as:  PROZAC     Take 1 capsule (10 mg total) by mouth daily.                hyoscyamine 0.375 MG 12 hr tablet     Commonly known as:  LEVBID     Take 1 tablet (0.375 mg total) by mouth 2 (two) times daily.                levothyroxine 25 MCG tablet     Commonly known as:  SYNTHROID, LEVOTHROID     Take 1 tablet (25 mcg total) by mouth daily before breakfast.                levothyroxine 25 MCG tablet     Commonly known as:  SYNTHROID, LEVOTHROID     Take 1 tablet (25 mcg total) by mouth daily before breakfast.                metroNIDAZOLE 500 MG tablet     Commonly known as:  FLAGYL     Take 1 tablet (500 mg total) by mouth every 8 (eight) hours.                potassium chloride SA 20 MEQ tablet     Commonly known as:  K-DUR,KLOR-CON     Take 1 tablet (20 mEq total) by mouth daily.                pramipexole 1 MG tablet     Commonly known as:  MIRAPEX     TAKE 1 TABLET BY MOUTH EVERY NIGHT AT BEDTIME                predniSONE 5 MG tablet     Commonly known as:  DELTASONE  TAKE 1 TABLET BY MOUTH ONCE DAILY.                PROAIR HFA 108 (90 BASE) MCG/ACT inhaler     Generic drug:  albuterol     Inhale 1-2 puffs  into the lungs every 6 (six) hours as needed for wheezing or shortness of breath.                                             Follow-up Information          Follow up with Garnet Koyanagi, DO. Schedule an appointment as soon as possible for a visit in 1 week.         Specialty:  Family Medicine         Why:  Follow up appt after recent hospitalization         Contact information:        [address]  [place] [address]  [phone]                  --------------------------------------------------------------------------------       The results of significant diagnostics from this hospitalization (including imaging, microbiology, ancillary and laboratory) are listed below for reference.         Significant Diagnostic Studies:     Imaging Results                                                 Microbiology:         Recent Results (from the past 240 hour(s))     Urine Culture     Status: None         Collection Time: 07/28/14  5:55 PM     Result    Value    Ref Range    Status         Colony Count    NO GROWTH        Final         Organism ID, Bacteria    NO GROWTH        Final           Labs:  Basic Metabolic Panel:     Last Labs                                                                                                               Liver Function Tests:     Last Labs  Last Labs            Last Labs                                 CBC:     Last Labs                                                                                            Cardiac Enzymes:     Last Labs                                        BNP:  BNP (last 3 results)      Recent Labs (within last 365 days)        CBG:     Last Labs                                                                    Time coordinating discharge: Over 30 minutes                                Discharge Summaries by Robbie Lis, MD at 08/06/2014  3:51 PM  Version 6 of 7     Author: Robbie Lis, MD Service: Internal Medicine Author Type: Physician    Filed: 08/07/2014  4:42 PM Note Time: 08/06/2014  3:51 PM Status: Addendum    Editor: Robbie Lis, MD (Physician)      Related Notes: Addendum by Robbie Lis, MD (Physician) filed at 08/07/2014  4:44 PM      Original Note by Robbie Lis, MD (Physician) filed at 08/07/2014  4:39 PM      Expand All Collapse All            Physician Discharge Summary     NAYELIS BONITO VQQ:[id number] DOB: 11-Nov-1940 DOA: 08/04/2014     PCP: Garnet Koyanagi, DO     Admit date: 08/04/2014  Discharge date: 08/06/2014     Recommendations for Outpatient Follow-up:   1.TSH level needs to be recheck in 4 weeks. Synthroid dose lowered to 25 mcg daily since TSH level on this admission low at 0.055 2.Stool for C. difficile was positive on this admission. Patient will be discharged with Flagyl for 10 days on discharge.     Discharge Diagnoses:    Principal Problem:    Acute encephalopathy  Active Problems:    Cushing syndrome    Pituitary insufficiency    Malnutrition of moderate degree          Discharge Condition: stable      Diet recommendation: as tolerated      History of present illness:  73 y.o. female with history of asthma, Cushing's disease status post pituitary surgery and on replacement  prednisone and Synthroid, hyperlipidemia and ongoing tobacco abuse who presented to [facility] ED with altered mental status, more confusion per patient's son for past 24 hours prior to this admission. Per patient's son, patient was last seen in normal 2 days prior to the admission and went to dinner with her friend. Patient's son is not sure which medications his mom is taking. On the admission, CT head did not show acute intracranial findings. Chest x-ray did not show acute cardiopulmonary disease, urinalysis did not reveal signs of infection. Urine drug screen was positive for benzodiazepines and barbiturates. Patient was admitted for further evaluation of acute encephalopathy.            Assessment/Plan:          Principal Problem:    Acute encephalopathy  - Unclear etiology. The thought was that she may have adrenal insufficiency so she was put on stress dose steroids on the admission.  - cortisol level was WNL Pt will continue on discharge her usual prednisone dosage she takes for adrenal insufficiency.  - No evidence of pneumonia or UTI based on chest x-ray and urinalysis respectively  - CT head showed no acute intracranial findings  - Per patient's son, mental status much better this morning.    - Psychiatry consult requested, appreciate their recommendations. They said patient has a capacity to make living arrangements and medical treatment decisions.    Active Problems:    C. difficile positive  - Stool for C.diff positive so will go home with Flagyl for 10 days on discharge.    Cushing syndrome / Pituitary insufficiency   - Cortisol level WNL. Continue prednisone per prior home dose.  - Patient is on thyroid supplementation as well. TSH is low at 0.055. We decreased the dose of Synthroid from 50 mcg to 25 mcg daily.   Depression  - Continue Prozac 10 mg daily    Moderate malnutrition  - Pt meets criteria for moderate malnutrition in the context of acute illness as  evidenced by 17% wt loss x 6 months and energy intake <75% for >7 days.     Dyslipidemia  - Continue statin therapy daily     GERD  - continue PPI therapy       DVT Prophylaxis    - Lovenox subcutaneous ordered while pt is in hospital       Code Status: Full.    Family Communication:  plan of care discussed with the patient's son at the bedside            IV access:     .Peripheral IV         Procedures and diagnostic studies:        Dg Chest 2 View 08/04/2014    No acute cardiopulmonary disease.   Electronically Signed   By: Arne Cleveland M.D.   On: 08/04/2014 17:53      Ct Head Wo Contrast 08/04/2014    Atrophy with small vessel chronic ischemic changes of deep cerebral white matter.  No acute intracranial abnormalities.   Electronically Signed   By: Lavonia Dana M.D.   On: 08/04/2014 19:40             Medical Consultants:     .Psychiatry         Other Consultants:     .Physical therapy .Occupational therapy  IAnti-Infectives:      .None        Signed:     Leisa Lenz, MD      [facility]  08/06/2014, 3:51 PM     Pager #: [phone]        Discharge Exam:      Filed Vitals:         08/06/14 1000     BP:    108/50     Pulse:    76     Temp:    97.9 F (36.6 C)     Resp:    18              Filed Vitals:         08/06/14 0203    08/06/14 0520    08/06/14 0732    08/06/14 1000     BP:    99/56    100/59        108/50     Pulse:    63    60        76     Temp:    98.1 F (36.7 C)    98 F (36.7 C)        97.9 F (36.6 C)     TempSrc:    Oral    Oral        Axillary     Resp:    18    18        18      Height:                     Weight:                     SpO2:    99%    97%    95%    97%          General: Pt is alert, follows commands  appropriately, not in acute distress  Cardiovascular: Regular rate and rhythm, S1/S2 +, no murmurs  Respiratory: Clear to auscultation bilaterally, no wheezing, no crackles, no rhonchi  Abdominal: Soft, non tender, non distended, bowel sounds +, no guarding  Extremities: no edema, no cyanosis, pulses palpable bilaterally DP and PT  Neuro: Grossly nonfocal     Discharge Instructions           Discharge Instructions          Call MD for:  difficulty breathing, headache or visual disturbances       Complete by:  As directed                  Call MD for:  persistant dizziness or light-headedness       Complete by:  As directed                  Call MD for:  persistant nausea and vomiting       Complete by:  As directed                  Call MD for:  severe uncontrolled pain       Complete by:  As directed                  Diet - low sodium heart healthy       Complete by:  As directed                  Increase  activity slowly       Complete by:  As directed                                      Medication List          STOP taking these medications                       pantoprazole 40 MG tablet     Commonly known as:  PROTONIX                traMADol 50 MG tablet     Commonly known as:  ULTRAM                TAKE these medications                       alendronate 70 MG tablet     Commonly known as:  FOSAMAX     Take 1 tablet (70 mg total) by mouth every 7 (seven) days. Take with a full glass of water on an empty stomach.                ALPRAZolam 0.5 MG tablet     Commonly known as:  XANAX     Take 1 tablet (0.5 mg total) by mouth 2 (two) times daily as needed for sleep.                atorvastatin 40 MG tablet     Commonly known as:  LIPITOR     TAKE 1 TABLET BY MOUTH DAILY                 budesonide-formoterol 80-4.5 MCG/ACT inhaler     Commonly known as:  SYMBICORT     Inhale 2 puffs into the lungs as needed.                cyclobenzaprine 10 MG tablet     Commonly known as:  FLEXERIL     TAKE 1 TABLET (10 MG TOTAL) BY MOUTH 3 (THREE) TIMES DAILY AS NEEDED FOR MUSCLE SPASMS. No more med's until seen by provider                famotidine 20 MG tablet     Commonly known as:  PEPCID     Take 1 tablet (20 mg total) by mouth 2 (two) times daily.                feeding supplement (ENSURE COMPLETE) Liqd     Take 237 mLs by mouth 2 (two) times daily between meals.                ferrous sulfate 325 (65 FE) MG tablet     TAKE 1 TABLET BY MOUTH TWICE DAILY BEFORE LUNCH AND SUPPER                FLUoxetine 10 MG capsule     Commonly known as:  PROZAC     Take 1 capsule (10 mg total) by mouth daily.                hyoscyamine 0.375 MG 12 hr tablet     Commonly known as:  LEVBID     Take 1 tablet (0.375 mg total) by mouth 2 (two) times daily.  levothyroxine 25 MCG tablet     Commonly known as:  SYNTHROID, LEVOTHROID     Take 1 tablet (25 mcg total) by mouth daily before breakfast.                levothyroxine 25 MCG tablet     Commonly known as:  SYNTHROID, LEVOTHROID     Take 1 tablet (25 mcg total) by mouth daily before breakfast.                metroNIDAZOLE 500 MG tablet     Commonly known as:  FLAGYL     Take 1 tablet (500 mg total) by mouth every 8 (eight) hours.                potassium chloride SA 20 MEQ tablet     Commonly known as:  K-DUR,KLOR-CON     Take 1 tablet (20 mEq total) by mouth daily.                pramipexole 1 MG tablet     Commonly known as:  MIRAPEX     TAKE 1 TABLET BY MOUTH EVERY NIGHT AT BEDTIME                predniSONE 5 MG tablet     Commonly known as:  DELTASONE      TAKE 1 TABLET BY MOUTH ONCE DAILY.                PROAIR HFA 108 (90 BASE) MCG/ACT inhaler     Generic drug:  albuterol     Inhale 1-2 puffs into the lungs every 6 (six) hours as needed for wheezing or shortness of breath.                                             Follow-up Information          Follow up with Garnet Koyanagi, DO. Schedule an appointment as soon as possible for a visit in 1 week.         Specialty:  Family Medicine         Why:  Follow up appt after recent hospitalization         Contact information:        [address]  [place] [address]  [phone]                  --------------------------------------------------------------------------------       The results of significant diagnostics from this hospitalization (including imaging, microbiology, ancillary and laboratory) are listed below for reference.         Significant Diagnostic Studies:     Imaging Results                                                 Microbiology:         Recent Results (from the past 240 hour(s))     Urine Culture     Status: None         Collection Time: 07/28/14  5:55 PM     Result    Value    Ref Range    Status  Colony Count    NO GROWTH        Final         Organism ID, Bacteria    NO GROWTH        Final           Labs:  Basic Metabolic Panel:     Last Labs                                                                                                               Liver Function Tests:     Last Labs                                                                                               Last Labs             Last Labs                                 CBC:     Last Labs                                                                                           Cardiac Enzymes:     Last Labs                                        BNP:  BNP (last 3 results)      Recent Labs (within last 365 days)        CBG:     Last Labs                                                                    Time coordinating discharge: Over 30 minutes  Discharge Summaries by Robbie Lis, MD at 08/06/2014  3:51 PM  Version 5 of 7     Author: Robbie Lis, MD Service: Internal Medicine Author Type: Physician    Filed: 08/07/2014  4:39 PM Note Time: 08/06/2014  3:51 PM Status: Addendum    Editor: Robbie Lis, MD (Physician)      Related Notes: Addendum by Robbie Lis, MD (Physician) filed at 08/07/2014  4:42 PM      Original Note by Robbie Lis, MD (Physician) filed at 08/07/2014  4:36 PM      Expand All Collapse All            Physician Discharge Summary     LAZARIAH SAVARD JSH:[id number] DOB: [birth year] DOA: 08/04/2014     PCP: Garnet Koyanagi, DO     Admit date: 08/04/2014  Discharge date: 08/06/2014     Recommendations for Outpatient Follow-up:   1.TSH level needs to be recheck in 4 weeks. Synthroid dose lowered to 25 mcg daily since TSH level on this admission low at 0.055 2.Stool for C. difficile was positive on this admission. Patient will be discharged with Flagyl for 10 days on discharge.     Discharge Diagnoses:    Principal Problem:    Acute encephalopathy  Active Problems:    Cushing syndrome    Pituitary insufficiency    Malnutrition of moderate degree          Discharge Condition: stable      Diet recommendation: as  tolerated      History of present illness:   73 y.o. female with history of asthma, Cushing's disease status post pituitary surgery and on replacement prednisone and Synthroid, hyperlipidemia and ongoing tobacco abuse who presented to [facility] ED with altered mental status, more confusion per patient's son for past 24 hours prior to this admission. Per patient's son, patient was last seen in normal 2 days prior to the admission and went to dinner with her friend. Patient's son is not sure which medications his mom is taking. On the admission, CT head did not show acute intracranial findings. Chest x-ray did not show acute cardiopulmonary disease, urinalysis did not reveal signs of infection. Urine drug screen was positive for benzodiazepines and barbiturates. Patient was admitted for further evaluation of acute encephalopathy.            Assessment/Plan:          Principal Problem:    Acute encephalopathy  - Unclear etiology. The thought was that she may have adrenal insufficiency so she was put on stress dose steroids on the admission.  - cortisol level was WNL Pt will continue on discharge her usual prednisone dosage she takes for adrenal insufficiency.  - No evidence of pneumonia or UTI based on chest x-ray and urinalysis respectively  - CT head showed no acute intracranial findings  - Per patient's son, mental status much better this morning.    - Psychiatry consult requested, appreciate their recommendations. They said patient has a capacity to make living arrangements and medical treatment decisions.    Active Problems:    C. difficile positive  - Stool for C.diff positive so will go home with Flagyl for 10 days on discharge.    Cushing syndrome / Pituitary insufficiency   - Cortisol level WNL. Continue prednisone per prior home dose.  - Patient is on thyroid supplementation as well. TSH is low at 0.055. We decreased the dose of Synthroid from 50 mcg to  25 mcg  daily.   Depression  - Continue Prozac 10 mg daily    Moderate malnutrition  - Pt meets criteria for moderate malnutrition in the context of acute illness as evidenced by 17% wt loss x 6 months and energy intake <75% for >7 days.     Dyslipidemia  - Continue statin therapy daily     GERD  - continue PPI therapy       DVT Prophylaxis    - Lovenox subcutaneous ordered while pt is in hospital       Code Status: Full.    Family Communication:  plan of care discussed with the patient's son at the bedside            IV access:     .Peripheral IV         Procedures and diagnostic studies:        Dg Chest 2 View 08/04/2014    No acute cardiopulmonary disease.   Electronically Signed   By: Arne Cleveland M.D.   On: 08/04/2014 17:53      Ct Head Wo Contrast 08/04/2014    Atrophy with small vessel chronic ischemic changes of deep cerebral white matter.  No acute intracranial abnormalities.   Electronically Signed   By: Lavonia Dana M.D.   On: 08/04/2014 19:40             Medical Consultants:     .Psychiatry         Other Consultants:     .Physical therapy .Occupational therapy         IAnti-Infectives:      .None        Signed:     Leisa Lenz, MD      [facility]  08/06/2014, 3:51 PM     Pager #: [phone]        Discharge Exam:      Filed Vitals:         08/06/14 1000     BP:    108/50     Pulse:    76     Temp:    97.9 F (36.6 C)     Resp:    18              Filed Vitals:         08/06/14 0203    08/06/14 0520    08/06/14 0732    08/06/14 1000     BP:    99/56    100/59        108/50     Pulse:    63    60        76     Temp:    98.1 F (36.7 C)    98 F (36.7 C)        97.9 F (36.6 C)     TempSrc:    Oral    Oral        Axillary     Resp:    18    18        18      Height:                      Weight:                     SpO2:    99%    97%    95%    97%  General: Pt is alert, follows commands appropriately, not in acute distress  Cardiovascular: Regular rate and rhythm, S1/S2 +, no murmurs  Respiratory: Clear to auscultation bilaterally, no wheezing, no crackles, no rhonchi  Abdominal: Soft, non tender, non distended, bowel sounds +, no guarding  Extremities: no edema, no cyanosis, pulses palpable bilaterally DP and PT  Neuro: Grossly nonfocal     Discharge Instructions           Discharge Instructions          Call MD for:  difficulty breathing, headache or visual disturbances       Complete by:  As directed                  Call MD for:  persistant dizziness or light-headedness       Complete by:  As directed                  Call MD for:  persistant nausea and vomiting       Complete by:  As directed                  Call MD for:  severe uncontrolled pain       Complete by:  As directed                  Diet - low sodium heart healthy       Complete by:  As directed                  Increase activity slowly       Complete by:  As directed                                      Medication List          STOP taking these medications                       pantoprazole 40 MG tablet     Commonly known as:  PROTONIX                traMADol 50 MG tablet     Commonly known as:  ULTRAM                TAKE these medications                       alendronate 70 MG tablet     Commonly known as:  FOSAMAX     Take 1 tablet (70 mg total) by mouth every 7 (seven) days. Take with a full glass of water on an empty stomach.                ALPRAZolam 0.5 MG tablet     Commonly known as:  XANAX     Take 1 tablet (0.5 mg total) by mouth 2 (two) times daily as needed for  sleep.                atorvastatin 40 MG tablet     Commonly known as:  LIPITOR     TAKE 1 TABLET BY MOUTH DAILY                budesonide-formoterol 80-4.5 MCG/ACT inhaler     Commonly known as:  SYMBICORT     Inhale 2 puffs into the  lungs as needed.                cyclobenzaprine 10 MG tablet     Commonly known as:  FLEXERIL     TAKE 1 TABLET (10 MG TOTAL) BY MOUTH 3 (THREE) TIMES DAILY AS NEEDED FOR MUSCLE SPASMS. No more med's until seen by provider                famotidine 20 MG tablet     Commonly known as:  PEPCID     Take 1 tablet (20 mg total) by mouth 2 (two) times daily.                feeding supplement (ENSURE COMPLETE) Liqd     Take 237 mLs by mouth 2 (two) times daily between meals.                ferrous sulfate 325 (65 FE) MG tablet     TAKE 1 TABLET BY MOUTH TWICE DAILY BEFORE LUNCH AND SUPPER                FLUoxetine 10 MG capsule     Commonly known as:  PROZAC     Take 1 capsule (10 mg total) by mouth daily.                hyoscyamine 0.375 MG 12 hr tablet     Commonly known as:  LEVBID     Take 1 tablet (0.375 mg total) by mouth 2 (two) times daily.                levothyroxine 25 MCG tablet     Commonly known as:  SYNTHROID, LEVOTHROID     Take 1 tablet (25 mcg total) by mouth daily before breakfast.                levothyroxine 25 MCG tablet     Commonly known as:  SYNTHROID, LEVOTHROID     Take 1 tablet (25 mcg total) by mouth daily before breakfast.                metroNIDAZOLE 500 MG tablet     Commonly known as:  FLAGYL     Take 1 tablet (500 mg total) by mouth every 8 (eight) hours.                potassium chloride SA 20 MEQ tablet     Commonly known as:  K-DUR,KLOR-CON     Take 1 tablet (20 mEq total) by mouth daily.                pramipexole 1 MG tablet     Commonly known as:  MIRAPEX      TAKE 1 TABLET BY MOUTH EVERY NIGHT AT BEDTIME                predniSONE 5 MG tablet     Commonly known as:  DELTASONE     TAKE 1 TABLET BY MOUTH ONCE DAILY.                PROAIR HFA 108 (90 BASE) MCG/ACT inhaler     Generic drug:  albuterol     Inhale 1-2 puffs into the lungs every 6 (six) hours as needed for wheezing or shortness of breath.  Follow-up Information          Follow up with Garnet Koyanagi, DO. Schedule an appointment as soon as possible for a visit in 1 week.         Specialty:  Family Medicine         Why:  Follow up appt after recent hospitalization         Contact information:        [place] [address]  [place] [address]  [phone]                  --------------------------------------------------------------------------------       The results of significant diagnostics from this hospitalization (including imaging, microbiology, ancillary and laboratory) are listed below for reference.         Significant Diagnostic Studies:     Imaging Results                                                 Microbiology:         Recent Results (from the past 240 hour(s))     Urine Culture     Status: None         Collection Time: 07/28/14  5:55 PM     Result    Value    Ref Range    Status         Colony Count    NO GROWTH        Final         Organism ID, Bacteria    NO GROWTH        Final           Labs:  Basic Metabolic Panel:     Last Labs                                                                                                               Liver Function Tests:     Last Labs                                                                                                Last Labs            Last Labs                                 CBC:     Last Labs  Cardiac Enzymes:     Last Labs                                        BNP:  BNP (last 3 results)      Recent Labs (within last 365 days)        CBG:     Last Labs                                                                    Time coordinating discharge: Over 30 minutes                                Discharge Summaries by Robbie Lis, MD at 08/06/2014  3:51 PM  Version 4 of 7     Author: Robbie Lis, MD Service: Internal Medicine Author Type: Physician    Filed: 08/07/2014  4:36 PM Note Time: 08/06/2014  3:51 PM Status: Addendum    Editor: Robbie Lis, MD (Physician)      Related Notes: Addendum by Robbie Lis, MD (Physician) filed at 08/07/2014  4:39 PM      Original Note by Robbie Lis, MD (Physician) filed at 08/07/2014 11:27 AM      Expand All Collapse All            Physician Discharge Summary     Miki Kins LOV:[id number] DOB: 10-07-1940 DOA: 08/04/2014     PCP: Garnet Koyanagi, DO     Admit date: 08/04/2014  Discharge date: 08/06/2014     Recommendations for Outpatient Follow-up:   1.TSH level needs to be recheck in 4 weeks. Synthroid dose lowered to 25 mcg daily since TSH level on this admission low at 0.055 2.Stool for C. difficile was positive on this admission. Patient will be discharged with Flagyl for 10 days on discharge.     Discharge Diagnoses:    Principal Problem:    Acute encephalopathy  Active Problems:    Cushing syndrome    Pituitary  insufficiency    Malnutrition of moderate degree          Discharge Condition: stable      Diet recommendation: as tolerated      History of present illness:   73 y.o. 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 Per patient's son, patient was last seen in normal 2 days prior to the admission and went to dinner with her friend. Patient's son is not sure which medications his mom is taking. On the admission, CT head did not show acute intracranial findings. Chest x-ray did not show acute cardiopulmonary disease, urinalysis did not reveal signs of infection. Urine drug screen was positive for benzodiazepines and barbiturates. Patient was admitted for further evaluation of acute encephalopathy.            Assessment/Plan:          Principal  Problem:    Acute encephalopathy  - Unclear etiology. The thought was that she may have adrenal insufficiency so she was put on stress dose steroids on the admission.  - cortisol level was WNL Pt will continue on discharge her usual prednisone dosage she takes for adrenal insufficiency.  - No evidence of pneumonia or UTI based on chest x-ray and urinalysis respectively  - CT head showed no acute intracranial findings  - Per patient's son, mental status much better this morning.    - Psychiatry consult requested, appreciate their recommendations. They said patient has a capacity to make living arrangements and medical treatment decisions.    Active Problems:    C. difficile positive  - Stool for C.diff positive so will go home with Flagyl for 10 days on discharge.    Cushing syndrome / Pituitary insufficiency   - Cortisol level WNL. Continue prednisone per prior home dose.  -  Patient is on thyroid supplementation as well. TSH is low at 0.055. We decreased the dose of Synthroid from 50 mcg to 25 mcg daily.   Depression  - Continue Prozac 10 mg daily    Moderate malnutrition  - Pt meets criteria for moderate malnutrition in the context of acute illness as evidenced by 17% wt loss x 6 months and energy intake <75% for >7 days.     Dyslipidemia  - Continue statin therapy daily     GERD  - continue PPI therapy       DVT Prophylaxis    - Lovenox subcutaneous ordered while pt is in hospital       Code Status: Full.    Family Communication:  plan of care discussed with the patient's son at the bedside            IV access:     .Peripheral IV         Procedures and diagnostic studies:        Dg Chest 2 View 08/04/2014    No acute cardiopulmonary disease.   Electronically Signed   By: Arne Cleveland M.D.   On: 08/04/2014 17:53      Ct Head Wo Contrast 08/04/2014    Atrophy with small vessel chronic ischemic changes of deep cerebral white matter.  No acute intracranial abnormalities.   Electronically Signed   By: Lavonia Dana M.D.   On: 08/04/2014 19:40             Medical Consultants:     .Psychiatry         Other Consultants:     .Physical therapy .Occupational therapy         IAnti-Infectives:      .None        Signed:     Leisa Lenz, MD      [facility]  08/06/2014, 3:51 PM     Pager #: [phone]        Discharge Exam:      Filed Vitals:         08/06/14 1000     BP:    108/50     Pulse:    76     Temp:    97.9 F (36.6 C)     Resp:    18              Filed Vitals:         08/06/14 0203    08/06/14 0520    08/06/14 0732    08/06/14 1000  BP:    99/56    100/59        108/50     Pulse:    63    60        76     Temp:    98.1 F (36.7 C)    98 F (36.7 C)        97.9 F (36.6 C)      TempSrc:    Oral    Oral        Axillary     Resp:    18    18        18      Height:                     Weight:                     SpO2:    99%    97%    95%    97%          General: Pt is alert, follows commands appropriately, not in acute distress  Cardiovascular: Regular rate and rhythm, S1/S2 +, no murmurs  Respiratory: Clear to auscultation bilaterally, no wheezing, no crackles, no rhonchi  Abdominal: Soft, non tender, non distended, bowel sounds +, no guarding  Extremities: no edema, no cyanosis, pulses palpable bilaterally DP and PT  Neuro: Grossly nonfocal     Discharge Instructions           Discharge Instructions          Call MD for:  difficulty breathing, headache or visual disturbances       Complete by:  As directed                  Call MD for:  persistant dizziness or light-headedness       Complete by:  As directed                  Call MD for:  persistant nausea and vomiting       Complete by:  As directed                  Call MD for:  severe uncontrolled pain       Complete by:  As directed                  Diet - low sodium heart healthy       Complete by:  As directed                  Increase activity slowly       Complete by:  As directed                                      Medication List          STOP taking these medications                       pantoprazole 40 MG tablet     Commonly known as:  PROTONIX                traMADol 50 MG tablet     Commonly known as:  ULTRAM                TAKE these medications  alendronate 70 MG tablet     Commonly known as:  FOSAMAX     Take 1 tablet (70 mg total) by mouth every 7 (seven) days. Take with a full glass of water on an empty stomach.                ALPRAZolam 0.5 MG  tablet     Commonly known as:  XANAX     Take 1 tablet (0.5 mg total) by mouth 2 (two) times daily as needed for sleep.                atorvastatin 40 MG tablet     Commonly known as:  LIPITOR     TAKE 1 TABLET BY MOUTH DAILY                budesonide-formoterol 80-4.5 MCG/ACT inhaler     Commonly known as:  SYMBICORT     Inhale 2 puffs into the lungs as needed.                cyclobenzaprine 10 MG tablet     Commonly known as:  FLEXERIL     TAKE 1 TABLET (10 MG TOTAL) BY MOUTH 3 (THREE) TIMES DAILY AS NEEDED FOR MUSCLE SPASMS. No more med's until seen by provider                feeding supplement (ENSURE COMPLETE) Liqd     Take 237 mLs by mouth 2 (two) times daily between meals.                ferrous sulfate 325 (65 FE) MG tablet     TAKE 1 TABLET BY MOUTH TWICE DAILY BEFORE LUNCH AND SUPPER                FLUoxetine 10 MG capsule     Commonly known as:  PROZAC     Take 1 capsule (10 mg total) by mouth daily.                hyoscyamine 0.375 MG 12 hr tablet     Commonly known as:  LEVBID     Take 1 tablet (0.375 mg total) by mouth 2 (two) times daily.                levothyroxine 25 MCG tablet     Commonly known as:  SYNTHROID, LEVOTHROID     Take 1 tablet (25 mcg total) by mouth daily before breakfast.                levothyroxine 25 MCG tablet     Commonly known as:  SYNTHROID, LEVOTHROID     Take 1 tablet (25 mcg total) by mouth daily before breakfast.                metroNIDAZOLE 500 MG tablet     Commonly known as:  FLAGYL     Take 1 tablet (500 mg total) by mouth every 8 (eight) hours.                potassium chloride SA 20 MEQ tablet     Commonly known as:  K-DUR,KLOR-CON     Take 1 tablet (20 mEq total) by mouth daily.                pramipexole 1 MG tablet     Commonly known as:  MIRAPEX     TAKE 1 TABLET BY MOUTH  EVERY NIGHT AT BEDTIME  predniSONE 5 MG tablet     Commonly known as:  DELTASONE     TAKE 1 TABLET BY MOUTH ONCE DAILY.                PROAIR HFA 108 (90 BASE) MCG/ACT inhaler     Generic drug:  albuterol     Inhale 1-2 puffs into the lungs every 6 (six) hours as needed for wheezing or shortness of breath.                                       Follow-up Information          Follow up with Garnet Koyanagi, DO. Schedule an appointment as soon as possible for a visit in 1 week.         Specialty:  Family Medicine         Why:  Follow up appt after recent hospitalization         Contact information:        [place]  [place] [address]  [phone]                  --------------------------------------------------------------------------------       The results of significant diagnostics from this hospitalization (including imaging, microbiology, ancillary and laboratory) are listed below for reference.         Significant Diagnostic Studies:     Imaging Results                                                 Microbiology:         Recent Results (from the past 240 hour(s))     Urine Culture     Status: None         Collection Time: 07/28/14  5:55 PM     Result    Value    Ref Range    Status         Colony Count    NO GROWTH        Final         Organism ID, Bacteria    NO GROWTH        Final           Labs:  Basic Metabolic Panel:     Last Labs                                                                                                               Liver Function Tests:     Last Labs  Last Labs            Last Labs                                 CBC:     Last Labs                                                                                           Cardiac Enzymes:     Last Labs                                        BNP:  BNP (last 3 results)      Recent Labs (within last 365 days)        CBG:     Last Labs                                                                    Time coordinating discharge: Over 30 minutes                                Discharge Summaries by Robbie Lis, MD at 08/06/2014  3:51 PM  Version 3 of 7     Author: Robbie Lis, MD Service: Internal Medicine Author Type: Physician    Filed: 08/07/2014 11:27 AM Note Time: 08/06/2014  3:51 PM Status: Addendum    Editor: Robbie Lis, MD (Physician)      Related Notes: Addendum by Robbie Lis, MD (Physician) filed at 08/07/2014  4:36 PM      Original Note by Robbie Lis, MD (Physician) filed at 08/06/2014  4:11 PM      Expand All Collapse All            Physician Discharge Summary     YUVONNE LANAHAN RSW:[id number] DOB: 1941/07/23 DOA: 08/04/2014     PCP: Garnet Koyanagi, DO     Admit date: 08/04/2014  Discharge date: 08/06/2014     Recommendations for Outpatient Follow-up:   1.TSH level needs to be recheck in 4 weeks. Synthroid dose lowered to 25 mcg daily since TSH level on this admission low at 0.055 2.Stool for C. difficile was positive on this admission. Patient will be discharged with Flagyl for 10 days on discharge.     Discharge Diagnoses:    Principal Problem:    Acute encephalopathy  Active Problems:    Cushing syndrome    Pituitary insufficiency    Malnutrition  of moderate degree          Discharge Condition: stable      Diet recommendation: as tolerated      History of present illness:   73  y.o. female with history of asthma, Cushing's disease status post pituitary surgery and on replacement prednisone and Synthroid, hyperlipidemia and ongoing tobacco abuse who presented to [facility] ED with altered mental status, more confusion per patient's son for past 24 hours prior to this admission. Per patient's son, patient was last seen in normal 2 days prior to the admission and went to dinner with her friend. Patient's son is not sure which medications his mom is taking. On the admission, CT head did not show acute intracranial findings. Chest x-ray did not show acute cardiopulmonary disease, urinalysis did not reveal signs of infection. Urine drug screen was positive for benzodiazepines and barbiturates. Patient was admitted for further evaluation of acute encephalopathy.            Assessment/Plan:          Principal Problem:    Acute encephalopathy  - Unclear etiology. The thought was that she may have adrenal insufficiency so she was put on stress dose steroids on the admission.  - cortisol level was WNL Pt will continue on discharge her usual prednisone dosage she takes for adrenal insufficiency.  - No evidence of pneumonia or UTI based on chest x-ray and urinalysis respectively  - CT head showed no acute intracranial findings  - Per patient's son, mental status much better this morning.    - Psychiatry consult requested, appreciate their recommendations. They said patient has a capacity to make living arrangements and medical treatment decisions.    Active Problems:    C. difficile positive  - Stool for C.diff positive so will go home with Flagyl for 10 days on discharge.    Cushing syndrome / Pituitary insufficiency   - Cortisol level WNL. Continue prednisone per prior home dose.  - Patient is on thyroid  supplementation as well. TSH is low at 0.055. We decreased the dose of Synthroid from 50 mcg to 25 mcg daily.   Depression  - Continue Prozac 10 mg daily    Moderate malnutrition  - Pt meets criteria for moderate malnutrition in the context of acute illness as evidenced by 17% wt loss x 6 months and energy intake <75% for >7 days.     Dyslipidemia  - Continue statin therapy daily     GERD  - continue PPI therapy       DVT Prophylaxis    - Lovenox subcutaneous ordered while pt is in hospital       Code Status: Full.    Family Communication:  plan of care discussed with the patient's son at the bedside            IV access:     .Peripheral IV         Procedures and diagnostic studies:        Dg Chest 2 View 08/04/2014    No acute cardiopulmonary disease.   Electronically Signed   By: Arne Cleveland M.D.   On: 08/04/2014 17:53      Ct Head Wo Contrast 08/04/2014    Atrophy with small vessel chronic ischemic changes of deep cerebral white matter.  No acute intracranial abnormalities.   Electronically Signed   By: Lavonia Dana M.D.   On: 08/04/2014 19:40             Medical Consultants:     .Psychiatry         Other Consultants:     .Physical therapy .Occupational therapy         IAnti-Infectives:      .  None        Signed:     Leisa Lenz, MD      [facility]  08/06/2014, 3:51 PM     Pager #: [phone]        Discharge Exam:      Filed Vitals:         08/06/14 1000     BP:    108/50     Pulse:    76     Temp:    97.9 F (36.6 C)     Resp:    18              Filed Vitals:         08/06/14 0203    08/06/14 0520    08/06/14 0732    08/06/14 1000     BP:    99/56    100/59        108/50     Pulse:    63    60        76     Temp:    98.1 F (36.7 C)    98 F (36.7 C)        97.9 F (36.6 C)     TempSrc:     Oral    Oral        Axillary     Resp:    18    18        18      Height:                     Weight:                     SpO2:    99%    97%    95%    97%          General: Pt is alert, follows commands appropriately, not in acute distress  Cardiovascular: Regular rate and rhythm, S1/S2 +, no murmurs  Respiratory: Clear to auscultation bilaterally, no wheezing, no crackles, no rhonchi  Abdominal: Soft, non tender, non distended, bowel sounds +, no guarding  Extremities: no edema, no cyanosis, pulses palpable bilaterally DP and PT  Neuro: Grossly nonfocal     Discharge Instructions           Discharge Instructions          Call MD for:  difficulty breathing, headache or visual disturbances       Complete by:  As directed                  Call MD for:  persistant dizziness or light-headedness       Complete by:  As directed                  Call MD for:  persistant nausea and vomiting       Complete by:  As directed                  Call MD for:  severe uncontrolled pain       Complete by:  As directed                  Diet - low sodium heart healthy       Complete by:  As directed                  Increase activity slowly  Complete by:  As directed                                      Medication List          STOP taking these medications                       traMADol 50 MG tablet     Commonly known as:  ULTRAM                TAKE these medications                       alendronate 70 MG tablet     Commonly known as:  FOSAMAX     Take 1 tablet (70 mg total) by mouth every 7 (seven) days. Take with a full glass of water on an empty stomach.                ALPRAZolam 0.5 MG tablet     Commonly known as:  XANAX     Take 1 tablet (0.5 mg total) by mouth 2 (two) times daily as  needed for sleep.                atorvastatin 40 MG tablet     Commonly known as:  LIPITOR     TAKE 1 TABLET BY MOUTH DAILY                budesonide-formoterol 80-4.5 MCG/ACT inhaler     Commonly known as:  SYMBICORT     Inhale 2 puffs into the lungs as needed.                cyclobenzaprine 10 MG tablet     Commonly known as:  FLEXERIL     TAKE 1 TABLET (10 MG TOTAL) BY MOUTH 3 (THREE) TIMES DAILY AS NEEDED FOR MUSCLE SPASMS. No more med's until seen by provider                feeding supplement (ENSURE COMPLETE) Liqd     Take 237 mLs by mouth 2 (two) times daily between meals.                ferrous sulfate 325 (65 FE) MG tablet     TAKE 1 TABLET BY MOUTH TWICE DAILY BEFORE LUNCH AND SUPPER                FLUoxetine 10 MG capsule     Commonly known as:  PROZAC     Take 1 capsule (10 mg total) by mouth daily.                hyoscyamine 0.375 MG 12 hr tablet     Commonly known as:  LEVBID     Take 1 tablet (0.375 mg total) by mouth 2 (two) times daily.                levothyroxine 25 MCG tablet     Commonly known as:  SYNTHROID, LEVOTHROID     Take 1 tablet (25 mcg total) by mouth daily before breakfast.                levothyroxine 25 MCG tablet     Commonly known as:  SYNTHROID, LEVOTHROID     Take 1 tablet (25 mcg total) by mouth daily  before breakfast.                metroNIDAZOLE 500 MG tablet     Commonly known as:  FLAGYL     Take 1 tablet (500 mg total) by mouth every 8 (eight) hours.                pantoprazole 40 MG tablet     Commonly known as:  PROTONIX     TAKE 1 TABLET BY MOUTH TWICE DAILY                potassium chloride SA 20 MEQ tablet     Commonly known as:  K-DUR,KLOR-CON     Take 1 tablet (20 mEq total) by mouth daily.                pramipexole 1 MG tablet     Commonly known as:  MIRAPEX      TAKE 1 TABLET BY MOUTH EVERY NIGHT AT BEDTIME                predniSONE 5 MG tablet     Commonly known as:  DELTASONE     TAKE 1 TABLET BY MOUTH ONCE DAILY.                PROAIR HFA 108 (90 BASE) MCG/ACT inhaler     Generic drug:  albuterol     Inhale 1-2 puffs into the lungs every 6 (six) hours as needed for wheezing or shortness of breath.                                    Follow-up Information          Follow up with Garnet Koyanagi, DO. Schedule an appointment as soon as possible for a visit in 1 week.         Specialty:  Family Medicine         Why:  Follow up appt after recent hospitalization         Contact information:        [address]  [place] [address]  [phone]                  --------------------------------------------------------------------------------       The results of significant diagnostics from this hospitalization (including imaging, microbiology, ancillary and laboratory) are listed below for reference.         Significant Diagnostic Studies:     Imaging Results                                                 Microbiology:         Recent Results (from the past 240 hour(s))     Urine Culture     Status: None         Collection Time: 07/28/14  5:55 PM     Result    Value    Ref Range    Status         Colony Count    NO GROWTH        Final         Organism ID, Bacteria    NO GROWTH        Final  Labs:  Basic Metabolic Panel:     Last Labs                                                                                                               Liver Function Tests:     Last Labs                                                                                                Last Labs            Last Labs                                 CBC:     Last Labs                                                                                           Cardiac Enzymes:     Last Labs                                        BNP:  BNP (last 3 results)      Recent Labs (within last 365 days)        CBG:     Last Labs                                                                    Time coordinating discharge: Over 30 minutes                                Discharge Summaries by Robbie Lis, MD at 08/06/2014  3:51 PM  Version 2 of 7     Author: Robbie Lis, MD Service: Internal Medicine Author Type: Physician    Filed: 08/06/2014  4:11 PM Note Time: 08/06/2014  3:51 PM Status: Addendum  Editor: Robbie Lis, MD (Physician)      Related Notes: Addendum by Robbie Lis, MD (Physician) filed at 08/07/2014 11:27 AM      Original Note by Robbie Lis, MD (Physician) filed at 08/06/2014  4:07 PM      Expand All Collapse All            Physician Discharge Summary     NAKEMA FAKE [id number]:[id number] DOB: Aug 09, 1941 DOA: 08/04/2014     PCP: Garnet Koyanagi, DO     Admit date: 08/04/2014  Discharge date: 08/06/2014     Recommendations for Outpatient Follow-up:   1.TSH level needs to be recheck in 4 weeks. Synthroid dose lowered to 25 mcg daily since TSH level on this admission ow at 0.055     Discharge Diagnoses:    Principal Problem:    Acute encephalopathy  Active Problems:    Cushing syndrome    Pituitary insufficiency    Malnutrition of moderate degree          Discharge Condition: stable      Diet recommendation: as  tolerated      History of present illness:   73 y.o. female with history of asthma, Cushing's disease status post pituitary surgery and on replacement prednisone and Synthroid, hyperlipidemia and ongoing tobacco abuse who presented to [facility] ED with altered mental status, more confusion per patient's son for past 24 hours prior to this admission. Per patient's son, patient was last seen in normal 2 days prior to the admission and went to dinner with her friend. Patient's son is not sure which medications his mom is taking. On the admission, CT head did not show acute intracranial findings. Chest x-ray did not show acute cardiopulmonary disease, urinalysis did not reveal signs of infection. Urine drug screen was positive for benzodiazepines and barbiturates. Patient was admitted for further evaluation of acute encephalopathy.            Assessment/Plan:          Principal Problem:    Acute encephalopathy  - Unclear etiology. The thought was that she may have adrenal insufficiency so she was put on stress dose steroids on the admission.  - cortisol level was WNL Pt will continue on discharge her usual prednisone dosage she takes for adrenal insufficiency.  - No evidence of pneumonia or UTI based on chest x-ray and urinalysis respectively  - CT head showed no acute intracranial findings  - Per patient's son, mental status much better this morning.    - Psychiatry consult requested, appreciate their recommendations. They said patient has a capacity to make living arrangements and medical treatment decisions.    Active Problems:    Cushing syndrome / Pituitary insufficiency   - Cortisol level WNL. Continue prednisone per prior home dose.  - Patient is on thyroid supplementation as well. TSH is low at 0.055. We decreased the dose of Synthroid from 50 mcg to 25 mcg daily.   Depression  - Continue Prozac 10 mg daily    Moderate malnutrition  - Pt meets criteria for moderate  malnutrition in the context of acute illness as evidenced by 17% wt loss x 6 months and energy intake <75% for >7 days.        Dyslipidemia  - Continue statin therapy daily     GERD  - continue PPI therapy       DVT Prophylaxis    - Lovenox subcutaneous ordered while pt is in hospital  Code Status: Full.    Family Communication:  plan of care discussed with the patient's son at the bedside            IV access:     .Peripheral IV         Procedures and diagnostic studies:        Dg Chest 2 View 08/04/2014    No acute cardiopulmonary disease.   Electronically Signed   By: Arne Cleveland M.D.   On: 08/04/2014 17:53      Ct Head Wo Contrast 08/04/2014    Atrophy with small vessel chronic ischemic changes of deep cerebral white matter.  No acute intracranial abnormalities.   Electronically Signed   By: Lavonia Dana M.D.   On: 08/04/2014 19:40             Medical Consultants:     .Psychiatry         Other Consultants:     .Physical therapy .Occupational therapy         IAnti-Infectives:      .None        Signed:     Leisa Lenz, MD      [facility]  08/06/2014, 3:51 PM     Pager #: [phone]        Discharge Exam:      Filed Vitals:         08/06/14 1000     BP:    108/50     Pulse:    76     Temp:    97.9 F (36.6 C)     Resp:    18              Filed Vitals:         08/06/14 0203    08/06/14 0520    08/06/14 0732    08/06/14 1000     BP:    99/56    100/59        108/50     Pulse:    63    60        76     Temp:    98.1 F (36.7 C)    98 F (36.7 C)        97.9 F (36.6 C)     TempSrc:    Oral    Oral        Axillary     Resp:    18    18        18      Height:                     Weight:                     SpO2:    99%    97%    95%    97%           General: Pt is alert, follows commands appropriately, not in acute distress  Cardiovascular: Regular rate and rhythm, S1/S2 +, no murmurs  Respiratory: Clear to auscultation bilaterally, no wheezing, no crackles, no rhonchi  Abdominal: Soft, non tender, non distended, bowel sounds +, no guarding  Extremities: no edema, no cyanosis, pulses palpable bilaterally DP and PT  Neuro: Grossly nonfocal     Discharge Instructions           Discharge Instructions          Call MD for:  difficulty breathing, headache or visual disturbances       Complete by:  As directed                  Call MD for:  persistant dizziness or light-headedness       Complete by:  As directed                  Call MD for:  persistant nausea and vomiting       Complete by:  As directed                  Call MD for:  severe uncontrolled pain       Complete by:  As directed                  Diet - low sodium heart healthy       Complete by:  As directed                  Increase activity slowly       Complete by:  As directed                                      Medication List          STOP taking these medications                       traMADol 50 MG tablet     Commonly known as:  ULTRAM                TAKE these medications                       alendronate 70 MG tablet     Commonly known as:  FOSAMAX     Take 1 tablet (70 mg total) by mouth every 7 (seven) days. Take with a full glass of water on an empty stomach.                ALPRAZolam 0.5 MG tablet     Commonly known as:  XANAX     Take 1 tablet (0.5 mg total) by mouth 2 (two) times daily as needed for sleep.                atorvastatin 40 MG tablet     Commonly known as:  LIPITOR     TAKE 1 TABLET BY MOUTH DAILY                budesonide-formoterol 80-4.5 MCG/ACT inhaler      Commonly known as:  SYMBICORT     Inhale 2 puffs into the lungs as needed.                cyclobenzaprine 10 MG tablet     Commonly known as:  FLEXERIL     TAKE 1 TABLET (10 MG TOTAL) BY MOUTH 3 (THREE) TIMES DAILY AS NEEDED FOR MUSCLE SPASMS. No more med's until seen by provider                feeding supplement (ENSURE COMPLETE) Liqd     Take 237 mLs by mouth 2 (two) times daily between meals.                ferrous sulfate 325 (65 FE) MG tablet  TAKE 1 TABLET BY MOUTH TWICE DAILY BEFORE LUNCH AND SUPPER                FLUoxetine 10 MG capsule     Commonly known as:  PROZAC     Take 1 capsule (10 mg total) by mouth daily.                hyoscyamine 0.375 MG 12 hr tablet     Commonly known as:  LEVBID     Take 1 tablet (0.375 mg total) by mouth 2 (two) times daily.                levothyroxine 25 MCG tablet     Commonly known as:  SYNTHROID, LEVOTHROID     Take 1 tablet (25 mcg total) by mouth daily before breakfast.                levothyroxine 25 MCG tablet     Commonly known as:  SYNTHROID, LEVOTHROID     Take 1 tablet (25 mcg total) by mouth daily before breakfast.                pantoprazole 40 MG tablet     Commonly known as:  PROTONIX     TAKE 1 TABLET BY MOUTH TWICE DAILY                potassium chloride SA 20 MEQ tablet     Commonly known as:  K-DUR,KLOR-CON     Take 1 tablet (20 mEq total) by mouth daily.                pramipexole 1 MG tablet     Commonly known as:  MIRAPEX     TAKE 1 TABLET BY MOUTH EVERY NIGHT AT BEDTIME                predniSONE 5 MG tablet     Commonly known as:  DELTASONE     TAKE 1 TABLET BY MOUTH ONCE DAILY.                PROAIR HFA 108 (90 BASE) MCG/ACT inhaler     Generic drug:  albuterol     Inhale 1-2 puffs into the lungs every 6 (six) hours as needed for wheezing or shortness of  breath.        Follow up with Garnet Koyanagi, DO. Schedule an appointment as soon as possible for a visit in 1 week.         Specialty:  Family Medicine         Why:  Follow up appt after recent hospitalization         Contact information:        [address]  [place] [address]  [phone]   .                 ABNORMAL Clostridium Difficile by PCR (Order [id number])           Clostridium Difficile by PCR  Status:Final result     Visible to patient: Not Released     Next appt:09/08/2014 at 10:00 AM in Family Medicine ([facility])        Abnormal               Ref Range 10d ago  66mo ago  52yr ago      C difficile by pcr NEGATIVE   POSITIVE (A)   Not  DetectedR, CM   NEGATIVE     Comments: CRITICAL RESULT CALLED TO, READ BACK BY AND VERIFIED WITH:  A. Dana Allan RN 10:40 08/07/14 ([email or web address])  Performed at [place] [facility]     Specimen Collected: 08/06/14  8:02 PM Last Resulted: 08/07/14 10:34 AM            CM=Additional comments  R=Reference range differs from displayed range                                       Encounter       View Encounter            Result Information         Status      Abnormal Final result (08/07/2014 10:34 AM)      Provider Status: Ordered               Lab Information       [facility]                                 Order-Level Documents:       There are no order-level documents.           View SmartLink Info       Clostridium Difficile by PCR (Order #[id number]) on 08/06/14                 Clostridium Difficile by PCR (Order [id number])  Microbiology  [phone]    Released By: Mercy Moore, RN (auto-released)  Authorizing: Robbie Lis, MD    Date: 08/06/2014  Department: [facility]LEMETRY[facility]                   Order Information       Order Date/Time Release Date/Time Start Date/Time End Date/Time      08/06/14 06:31 PM 08/06/14 06:31 PM 08/06/14 06:31 PM 08/06/14 06:31 PM                 Order Details       Frequency Duration Priority Order Class      Once 1  occurrence Routine Unit Collect             Acc#      [id number]                 Order History Inpatient       Date/Time Action Taken User Additional Information      08/06/14 1831 Release Mercy Moore, RN (auto-released) From Order: [id number]      08/06/14 2009 Result Lab In [place] In process      08/07/14 1034 Result Lab In [employer] Final             Order Requisition       Clostridium Difficile by PCR (Order #[id number]) on 08/06/14           Collection Information        Collected: 08/06/2014  8:02 PM    Resulting Agency: [id number]          Ref Range 10d ago  61mo ago  85yr ago     C difficile by pcr NEGATIVE  POSITIVE (A) Not DetectedR, CM NEGATIVE  Comments: CRITICAL RESULT CALLED  TO, READ BACK BY AND VERIFIED WITH:  A. Dana Allan RN 10:40 08/07/14 ([email or web address])  Performed at [place] [facility]  Specimen Collected: 08/06/14 8:02 PM Last Resulted: 08/07/14 10:34      Ref Range 11d ago  2wk ago  40mo ago      TSH 0.350 - 4.500 uIU/mL 0.055 (L) 0.23 (L)R 0.04 (L)R   Comments: Performed at [facility]                           --------------------------------------------------------------------------------                                                                                                                    Liver Function Tests:     Last Labs                                                                                                Last Labs            Last Labs                                 CBC:     Last Labs                                                                                           Cardiac Enzymes:     Last Labs                                        BNP:  BNP (last 3 results)      Recent Labs (within last 365 days)        CBG:     Last Labs  Time coordinating discharge: Over 30 minutes                                Discharge Summaries by Robbie Lis, MD at 08/06/2014  3:51 PM  Version 1 of 7     Author: Robbie Lis, MD Service: Internal Medicine Author Type: Physician    Filed: 08/06/2014  4:07 PM Note Time: 08/06/2014  3:51 PM Status: Signed    Editor: Robbie Lis, MD (Physician)      Related Notes: Addendum by Robbie Lis, MD (Physician) filed at 08/06/2014  4:11 PM      Expand All Collapse All            Physician Discharge Summary     Miki Kins UXN:[id number] DOB: 02-19-1941 DOA: 08/04/2014     PCP: Garnet Koyanagi, DO     Admit date: 08/04/2014  Discharge date: 08/06/2014     Recommendations for Outpatient Follow-up:   1.TSH level needs to be recheck in 4 weeks. Synthroid dose lowered to 25 mcg daily since TSH level on this admission ow at 0.055     Discharge Diagnoses:    Principal Problem:    Acute encephalopathy  Active Problems:    Cushing syndrome    Pituitary insufficiency    Malnutrition of moderate degree          Discharge Condition: stable      Diet recommendation: as tolerated      History of present illness:   73 y.o. female with history of asthma, Cushing's disease status post pituitary surgery and on  replacement prednisone and Synthroid, hyperlipidemia and ongoing tobacco abuse who presented to [facility] ED with altered mental status, more confusion per patient's son for past 24 hours prior to this admission. Per patient's son, patient was last seen in normal 2 days prior to the admission and went to dinner with her friend. Patient's son is not sure which medications his mom is taking. On the admission, CT head did not show acute intracranial findings. Chest x-ray did not show acute cardiopulmonary disease, urinalysis did not reveal signs of infection. Urine drug screen was positive for benzodiazepines and barbiturates. Patient was admitted for further evaluation of acute encephalopathy.            Assessment/Plan:          Principal Problem:    Acute encephalopathy  - Unclear etiology. The thought was that she may have adrenal insufficiency so she was put on stress dose steroids on the admission.  - cortisol level was WNL Pt will continue on discharge her usual prednisone dosage she takes for adrenal insufficiency.  - No evidence of pneumonia or UTI based on chest x-ray and urinalysis respectively  - CT head showed no acute intracranial findings  - Per patient's son, mental status much better this morning.    - Psychiatry consult requested, appreciate their recommendations. They said patient has a capacity to make living arrangements and medical treatment decisions.    Active Problems:    Cushing syndrome / Pituitary insufficiency   - Cortisol level WNL. Continue prednisone per prior home dose.  - Patient is on thyroid supplementation as well. TSH is low at 0.055. We decreased the dose of Synthroid from 50 mcg to 25 mcg daily.   Depression  - Continue Prozac 10 mg daily   Dyslipidemia  - Continue statin therapy daily  GERD  - continue PPI therapy       DVT Prophylaxis    - Lovenox subcutaneous ordered while pt is in hospital       Code Status: Full.     Family Communication:  plan of care discussed with the patient's son at the bedside            IV access:     .Peripheral IV         Procedures and diagnostic studies:        Dg Chest 2 View 08/04/2014    No acute cardiopulmonary disease.   Electronically Signed   By: Arne Cleveland M.D.   On: 08/04/2014 17:53      Ct Head Wo Contrast 08/04/2014    Atrophy with small vessel chronic ischemic changes of deep cerebral white matter.  No acute intracranial abnormalities.   Electronically Signed   By: Lavonia Dana M.D.   On: 08/04/2014 19:40             Medical Consultants:     .Psychiatry         Other Consultants:     .Physical therapy .Occupational therapy         IAnti-Infectives:      .None        Signed:     Leisa Lenz, MD      [facility]  08/06/2014, 3:51 PM     Pager #: [phone]        Discharge Exam:      Filed Vitals:         08/06/14 1000     BP:    108/50     Pulse:    76     Temp:    97.9 F (36.6 C)     Resp:    18              Filed Vitals:         08/06/14 0203    08/06/14 0520    08/06/14 0732    08/06/14 1000     BP:    99/56    100/59        108/50     Pulse:    63    60        76     Temp:    98.1 F (36.7 C)    98 F (36.7 C)        97.9 F (36.6 C)     TempSrc:    Oral    Oral        Axillary     Resp:    18    18        18      Height:                     Weight:                     SpO2:    99%    97%    95%    97%          General: Pt is alert, follows commands appropriately, not in acute distress  Cardiovascular: Regular rate and rhythm, S1/S2 +, no murmurs  Respiratory: Clear to auscultation bilaterally, no wheezing, no crackles, no rhonchi  Abdominal: Soft, non tender, non distended, bowel sounds +, no guarding  Extremities: no edema,  no cyanosis, pulses palpable bilaterally DP and PT  Neuro: Grossly nonfocal     Discharge Instructions           Discharge Instructions          Call MD for:  difficulty breathing, headache or visual disturbances       Complete by:  As directed                  Call MD for:  persistant dizziness or light-headedness       Complete by:  As directed                  Call MD for:  persistant nausea and vomiting       Complete by:  As directed                  Call MD for:  severe uncontrolled pain       Complete by:  As directed                  Diet - low sodium heart healthy       Complete by:  As directed                  Increase activity slowly       Complete by:  As directed                                      Medication List          STOP taking these medications                       traMADol 50 MG tablet     Commonly known as:  ULTRAM                TAKE these medications                       alendronate 70 MG tablet     Commonly known as:  FOSAMAX     Take 1 tablet (70 mg total) by mouth every 7 (seven) days. Take with a full glass of water on an empty stomach.                ALPRAZolam 0.5 MG tablet     Commonly known as:  XANAX     Take 1 tablet (0.5 mg total) by mouth 2 (two) times daily as needed for sleep.                atorvastatin 40 MG tablet     Commonly known as:  LIPITOR     TAKE 1 TABLET BY MOUTH DAILY                budesonide-formoterol 80-4.5 MCG/ACT inhaler     Commonly known as:  SYMBICORT     Inhale 2 puffs into the lungs as needed.                cyclobenzaprine 10 MG tablet     Commonly known as:  FLEXERIL     TAKE 1 TABLET (10 MG TOTAL) BY MOUTH 3 (THREE) TIMES DAILY AS NEEDED FOR MUSCLE SPASMS. No more med's until seen by provider                feeding  supplement (ENSURE COMPLETE) Liqd     Take 237 mLs  by mouth 2 (two) times daily between meals.                ferrous sulfate 325 (65 FE) MG tablet     TAKE 1 TABLET BY MOUTH TWICE DAILY BEFORE LUNCH AND SUPPER                FLUoxetine 10 MG capsule     Commonly known as:  PROZAC     Take 1 capsule (10 mg total) by mouth daily.                hyoscyamine 0.375 MG 12 hr tablet     Commonly known as:  LEVBID     Take 1 tablet (0.375 mg total) by mouth 2 (two) times daily.                levothyroxine 25 MCG tablet     Commonly known as:  SYNTHROID, LEVOTHROID     Take 1 tablet (25 mcg total) by mouth daily before breakfast.                levothyroxine 25 MCG tablet     Commonly known as:  SYNTHROID, LEVOTHROID     Take 1 tablet (25 mcg total) by mouth daily before breakfast.                pantoprazole 40 MG tablet     Commonly known as:  PROTONIX     TAKE 1 TABLET BY MOUTH TWICE DAILY                potassium chloride SA 20 MEQ tablet     Commonly known as:  K-DUR,KLOR-CON     Take 1 tablet (20 mEq total) by mouth daily.                pramipexole 1 MG tablet     Commonly known as:  MIRAPEX     TAKE 1 TABLET BY MOUTH EVERY NIGHT AT BEDTIME                predniSONE 5 MG tablet     Commonly known as:  DELTASONE     TAKE 1 TABLET BY MOUTH ONCE DAILY.                PROAIR HFA 108 (90 BASE) MCG/ACT inhaler     Generic drug:  albuterol     Inhale 1-2 puffs into the lungs every 6 (six) hours as needed for wheezing or shortness of breath.                                Follow-up Information          Follow up with Garnet Koyanagi, DO. Schedule an appointment as soon as possible for a visit in 1 week.         Specialty:  Family Medicine         Why:  Follow up appt after recent hospitalization         Contact information:         [place]  [place] [address]  [phone]                  --------------------------------------------------------------------------------       The results of significant diagnostics from this hospitalization (including imaging, microbiology, ancillary and laboratory) are listed below for reference.  Significant Diagnostic Studies:     Imaging Results                                                 Microbiology:         Recent Results (from the past 240 hour(s))     Urine Culture     Status: None         Collection Time: 07/28/14  5:55 PM     Result    Value    Ref Range    Status         Colony Count    NO GROWTH        Final         Organism ID, Bacteria    NO GROWTH        Final           Labs:  Basic Metabolic Panel:     Last Labs                                                                                                                                                                                                                                                                                                                                                        Past Medical History  Diagnosis Date  . Hypertension   . Hyperlipemia   . Asthma   . Diabetes mellitus without complication   . Asthma   . Back pain   . Seizures   . Anxiety   . Cushing disease   . GERD (gastroesophageal reflux disease) 01/24/2012  . Emphysema of lung   . Hyperlipidemia   . Ulcer   . Hemorrhoid  History   Social History  . Marital Status: Widowed    Spouse Name: N/A     Number of Children: N/A  . Years of Education: N/A   Occupational History  . Not on file.   Social History Main Topics  . Smoking status: Current Every Day Smoker -- 0.75 packs/day    Types: Cigarettes  . Smokeless tobacco: Never Used     Comment: Started smoking at age 89.form given 04-02-13  . Alcohol Use: No  . Drug Use: No  . Sexual Activity: Not Currently   Other Topics Concern  . Not on file   Social History Narrative   ** Merged History Encounter **       Current Outpatient Prescriptions  Medication Sig Dispense Refill  . alendronate (FOSAMAX) 70 MG tablet Take 1 tablet (70 mg total) by mouth every 7 (seven) days. Take with a full glass of water on an empty stomach. 4 tablet 11  . ALPRAZolam (XANAX) 0.5 MG tablet 1/2- 1 po bid prn 60 tablet 2  . atorvastatin (LIPITOR) 40 MG tablet 1 po qhs 30 tablet 2  . budesonide-formoterol (SYMBICORT) 80-4.5 MCG/ACT inhaler Inhale 2 puffs into the lungs as needed.    . cyclobenzaprine (FLEXERIL) 10 MG tablet 1/2 tab po tid prn 60 tablet 0  . famotidine (PEPCID) 20 MG tablet Take 1 tablet (20 mg total) by mouth 2 (two) times daily. 60 tablet 0  . feeding supplement, ENSURE COMPLETE, (ENSURE COMPLETE) LIQD Take 237 mLs by mouth 2 (two) times daily between meals. 237 mL 0  . ferrous sulfate 325 (65 FE) MG tablet TAKE 1 TABLET BY MOUTH TWICE DAILY BEFORE LUNCH AND SUPPER 60 tablet 11  . hyoscyamine (LEVBID) 0.375 MG 12 hr tablet Take 1 tablet (0.375 mg total) by mouth 2 (two) times daily. 60 tablet 0  . levothyroxine (SYNTHROID, LEVOTHROID) 25 MCG tablet Take 1 tablet (25 mcg total) by mouth daily before breakfast. 30 tablet 0  . metroNIDAZOLE (FLAGYL) 500 MG tablet Take 1 tablet (500 mg total) by mouth every 8 (eight) hours. 30 tablet 0  . pantoprazole (PROTONIX) 40 MG tablet TAKE 1 TABLET BY MOUTH TWICE DAILY 60 tablet 5  . potassium chloride SA (K-DUR,KLOR-CON) 20 MEQ tablet Take 1 tablet (20 mEq total) by mouth daily. 30 tablet 2  .  pramipexole (MIRAPEX) 1 MG tablet TAKE 1 TABLET BY MOUTH EVERY NIGHT AT BEDTIME 60 tablet 1  . predniSONE (DELTASONE) 5 MG tablet TAKE 1 TABLET BY MOUTH ONCE DAILY. 30 tablet 11  . PROAIR HFA 108 (90 BASE) MCG/ACT inhaler Inhale 1-2 puffs into the lungs every 6 (six) hours as needed for wheezing or shortness of breath.     Marland Kitchen FLUoxetine (PROZAC) 20 MG tablet Take 1 tablet (20 mg total) by mouth daily. 30 tablet 3  . meloxicam (MOBIC) 7.5 MG tablet Take 1 tablet (7.5 mg total) by mouth daily. 30 tablet 5   No current facility-administered medications for this visit.                      Review of Systems    as above Objective:   Physical Exam  BP 114/68 mmHg  Pulse 96  Temp(Src) 97.9 F (36.6 C) (Oral)  Wt 120 lb (54.432 kg)  SpO2 98% General appearance: alert, cooperative, appears stated age and no distress Head: Normocephalic, without obvious abnormality, atraumatic Neck: no adenopathy, supple, symmetrical, trachea midline and thyroid not enlarged, symmetric, no tenderness/mass/nodules Lungs:  clear to auscultation bilaterally Heart: regular rate and rhythm, S1, S2 normal, no murmur, click, rub or gallop Extremities: extremities normal, atraumatic, no cyanosis or edema       Assessment & Plan:  1. Hypothyroidism, unspecified hypothyroidism type Check labs next month - TSH; Future - T3, free; Future - T4, free; Future  2. Generalized anxiety disorder Refill meds - FLUoxetine (PROZAC) 20 MG tablet; Take 1 tablet (20 mg total) by mouth daily.  Dispense: 30 tablet; Refill: 3 - ALPRAZolam (XANAX) 0.5 MG tablet; 1/2- 1 po bid prn  Dispense: 60 tablet; Refill: 2  3. Midline thoracic back pain  - meloxicam (MOBIC) 7.5 MG tablet; Take 1 tablet (7.5 mg total) by mouth daily.  Dispense: 30 tablet; Refill: 5 - cyclobenzaprine (FLEXERIL) 10 MG tablet; 1/2 tab po tid prn  Dispense: 60 tablet; Refill: 0  4. Hyperlipidemia Check labs, con't meds - atorvastatin (LIPITOR) 40  MG tablet; 1 po qhs  Dispense: 30 tablet; Refill: 2  5. C. difficile colitis Finish abx

## 2014-08-17 MED ORDER — FLUOXETINE HCL 20 MG PO TABS: 20.0000 mg | ORAL_TABLET | Freq: Every day | ORAL | Status: DC

## 2014-08-19 ENCOUNTER — Telehealth: Payer: Self-pay | Admitting: *Deleted

## 2014-08-19 NOTE — Telephone Encounter (Signed)
Received home health certification and plan of care via fax from [facility]. Forwarded to Dr. Lowne. JG//CMA 

## 2014-08-24 DIAGNOSIS — N289 Disorder of kidney and ureter, unspecified: Secondary | ICD-10-CM

## 2014-08-24 DIAGNOSIS — R130 Aphagia: Secondary | ICD-10-CM

## 2014-08-24 DIAGNOSIS — R413 Other amnesia: Secondary | ICD-10-CM

## 2014-08-24 DIAGNOSIS — J449 Chronic obstructive pulmonary disease, unspecified: Secondary | ICD-10-CM

## 2014-08-24 DIAGNOSIS — E440 Moderate protein-calorie malnutrition: Secondary | ICD-10-CM

## 2014-08-25 NOTE — Telephone Encounter (Signed)
Form reviewed and signed by Dr. Etter Sjogren.  Faxed and fax confirmation received.

## 2014-09-08 ENCOUNTER — Other Ambulatory Visit: Payer: Self-pay

## 2014-09-19 ENCOUNTER — Other Ambulatory Visit: Payer: Self-pay | Admitting: Family Medicine

## 2014-09-23 ENCOUNTER — Ambulatory Visit: Payer: Self-pay | Admitting: Internal Medicine

## 2014-10-03 ENCOUNTER — Other Ambulatory Visit: Payer: Self-pay | Admitting: Family Medicine

## 2014-10-05 ENCOUNTER — Telehealth: Payer: Self-pay | Admitting: Family Medicine

## 2014-10-05 NOTE — Telephone Encounter (Signed)
Called [facility] was on hold for 10 mins  Will try again later.

## 2014-10-05 NOTE — Telephone Encounter (Signed)
Patient has been made aware that her Rx from Dec has 2 refills on it, so she needs to call the pharmacy. She verbalized understanding.    KP

## 2014-10-05 NOTE — Telephone Encounter (Signed)
Pt notified and made aware.  No further needs at this time.

## 2014-10-05 NOTE — Telephone Encounter (Signed)
Pharmacist stated that last prescription received was from October.  Therefore the following Rx was called in:  Medication Detail      Disp Refills Start End     ALPRAZolam (XANAX) 0.5 MG tablet 60 tablet 2 08/13/2014     Sig: 1/2- 1 po bid prn    Class: No Print

## 2014-10-05 NOTE — Telephone Encounter (Signed)
Patient daughter states that [facility] is telling her that patient has no refills left and would like to know if we can call the pharmacy.

## 2014-10-05 NOTE — Telephone Encounter (Signed)
[name] name: Dessie, Tatem Relation to pt: self  Call back number: [phone]   Reason for call:  Pt requesting a refill of ALPRAZolam (XANAX) 0.5 MG tablet

## 2014-10-24 ENCOUNTER — Other Ambulatory Visit: Payer: Self-pay | Admitting: Endocrinology

## 2014-10-24 ENCOUNTER — Other Ambulatory Visit: Payer: Self-pay | Admitting: Family Medicine

## 2014-10-26 NOTE — Telephone Encounter (Signed)
Rx sent 

## 2014-10-26 NOTE — Telephone Encounter (Signed)
Please advise if ok to refill. Rx is listed under Dr. Etter Sjogren.  Thanks!

## 2014-10-26 NOTE — Telephone Encounter (Signed)
Please refill x 1 Ov is due  

## 2014-11-11 ENCOUNTER — Telehealth: Payer: Self-pay | Admitting: Family Medicine

## 2014-11-11 NOTE — Telephone Encounter (Signed)
[name] name: Mayfield,Cynthia Relation to pt: daughter  Call back number: [phone]  Reason for call:  Daughter states mother called last week and requested a print out of all co payments in the last year to submit to her landlord. Pt was advised that paperwork would be mailed and pt has not received.  Daughter states she will come to the office 11/12/14 to pick up print out. Please advise

## 2014-11-12 ENCOUNTER — Ambulatory Visit: Payer: Self-pay | Admitting: Family Medicine

## 2014-11-12 NOTE — Telephone Encounter (Signed)
Contacted Monica Copeland on [phone] to inform that we would not be able to print the records - informed billing dept would be the ones to print and mail.

## 2014-11-16 ENCOUNTER — Ambulatory Visit (INDEPENDENT_AMBULATORY_CARE_PROVIDER_SITE_OTHER): Payer: Medicare Other | Admitting: Family Medicine

## 2014-11-16 ENCOUNTER — Encounter: Payer: Self-pay | Admitting: Family Medicine

## 2014-11-16 VITALS — BP 116/78 | HR 76 | Temp 98.0°F | Wt 116.2 lb

## 2014-11-16 DIAGNOSIS — Z79899 Other long term (current) drug therapy: Secondary | ICD-10-CM | POA: Diagnosis not present

## 2014-11-16 DIAGNOSIS — E785 Hyperlipidemia, unspecified: Secondary | ICD-10-CM

## 2014-11-16 DIAGNOSIS — E039 Hypothyroidism, unspecified: Secondary | ICD-10-CM | POA: Diagnosis not present

## 2014-11-16 DIAGNOSIS — N644 Mastodynia: Secondary | ICD-10-CM | POA: Insufficient documentation

## 2014-11-16 DIAGNOSIS — D509 Iron deficiency anemia, unspecified: Secondary | ICD-10-CM | POA: Diagnosis not present

## 2014-11-16 DIAGNOSIS — F419 Anxiety disorder, unspecified: Secondary | ICD-10-CM | POA: Diagnosis not present

## 2014-11-16 DIAGNOSIS — M8580 Other specified disorders of bone density and structure, unspecified site: Secondary | ICD-10-CM | POA: Diagnosis not present

## 2014-11-16 NOTE — Assessment & Plan Note (Signed)
Diagnostic mammogram rto if symptoms do not improve--- prob M/S

## 2014-11-16 NOTE — Progress Notes (Signed)
Pre visit review using our clinic review tool, if applicable. No additional management support is needed unless otherwise documented below in the visit note. 

## 2014-11-16 NOTE — Progress Notes (Signed)
Subjective:    Patient ID: Monica Copeland, female    DOB: Jan 09, 1941, 74 y.o.   MRN: [id number]  HPI  Patient here for f/u cholesterol , thyroid and iron levels.  She also c/o pain in L breast  Past Medical History  Diagnosis Date  . Hypertension   . Hyperlipemia   . Asthma   . Diabetes mellitus without complication   . Asthma   . Back pain   . Seizures   . Anxiety   . Cushing disease   . GERD (gastroesophageal reflux disease) 01/24/2012  . Emphysema of lung   . Hyperlipidemia   . Ulcer   . Hemorrhoid    Current Outpatient Prescriptions  Medication Sig Dispense Refill  . ALPRAZolam (XANAX) 0.5 MG tablet 1/2- 1 po bid prn 60 tablet 2  . atorvastatin (LIPITOR) 40 MG tablet TAKE 1 TABLET BY MOUTH DAILY 30 tablet 2  . famotidine (PEPCID) 20 MG tablet Take 1 tablet (20 mg total) by mouth 2 (two) times daily. 60 tablet 0  . feeding supplement, ENSURE COMPLETE, (ENSURE COMPLETE) LIQD Take 237 mLs by mouth 2 (two) times daily between meals. 237 mL 0  . ferrous sulfate 325 (65 FE) MG tablet TAKE 1 TABLET BY MOUTH TWICE DAILY BEFORE LUNCH AND SUPPER 60 tablet 11  . FLUoxetine (PROZAC) 20 MG tablet Take 1 tablet (20 mg total) by mouth daily. 30 tablet 5  . hyoscyamine (LEVBID) 0.375 MG 12 hr tablet Take 1 tablet (0.375 mg total) by mouth 2 (two) times daily. 60 tablet 0  . levothyroxine (SYNTHROID, LEVOTHROID) 25 MCG tablet Take 1 tablet (25 mcg total) by mouth daily before breakfast. 30 tablet 0  . pantoprazole (PROTONIX) 40 MG tablet TAKE 1 TABLET BY MOUTH TWICE DAILY 60 tablet 5  . pramipexole (MIRAPEX) 1 MG tablet TAKE 1 TABLET BY MOUTH EVERY NIGHT AT BEDTIME 60 tablet 1  . predniSONE (DELTASONE) 5 MG tablet TAKE 1 TABLET BY MOUTH ONCE DAILY. 30 tablet 11  . alendronate (FOSAMAX) 70 MG tablet Take 1 tablet (70 mg total) by mouth every 7 (seven) days. Take with a full glass of water on an empty stomach. 4 tablet 11  . budesonide-formoterol (SYMBICORT) 80-4.5 MCG/ACT inhaler  Inhale 2 puffs into the lungs as needed.    . cyclobenzaprine (FLEXERIL) 10 MG tablet 1/2 tab po tid prn 60 tablet 0  . meloxicam (MOBIC) 7.5 MG tablet Take 1 tablet (7.5 mg total) by mouth daily. (Patient not taking: Reported on 11/16/2014) 30 tablet 5  . potassium chloride SA (K-DUR,KLOR-CON) 20 MEQ tablet Take 1 tablet (20 mEq total) by mouth daily. (Patient not taking: Reported on 11/16/2014) 30 tablet 2  . PROAIR HFA 108 (90 BASE) MCG/ACT inhaler Inhale 1-2 puffs into the lungs every 6 (six) hours as needed for wheezing or shortness of breath.      No current facility-administered medications for this visit.    Review of Systems  Constitutional: Negative for activity change, appetite change, fatigue and unexpected weight change.  Respiratory: Negative for cough and shortness of breath.   Cardiovascular: Negative for chest pain and palpitations.  Genitourinary:       + L breast pain in upper outer quadrant ---- pain with movement---and in L arm No sob No diaphoresis  Psychiatric/Behavioral: Negative for behavioral problems and dysphoric mood. The patient is not nervous/anxious.        Objective:    Physical Exam  Constitutional: She is oriented to person, place, and  time. She appears well-developed and well-nourished. No distress.  HENT:  Right Ear: External ear normal.  Left Ear: External ear normal.  Nose: Nose normal.  Mouth/Throat: Oropharynx is clear and moist.  Eyes: EOM are normal. Pupils are equal, round, and reactive to light.  Neck: Normal range of motion. Neck supple. No thyroid mass and no thyromegaly present.  Cardiovascular: Normal rate, regular rhythm and normal heart sounds.   No murmur heard. Pulmonary/Chest: Effort normal and breath sounds normal. No respiratory distress. She has no wheezes. She has no rales. She exhibits no tenderness.  Genitourinary: There is breast tenderness.  L breast pain -- upper outer quadrant, no mass palpated   Musculoskeletal:  Normal range of motion. She exhibits tenderness. She exhibits no edema.       Right shoulder: She exhibits normal range of motion, no tenderness, no bony tenderness, no swelling, no effusion, no crepitus, no pain and no spasm.       Left shoulder: She exhibits tenderness. She exhibits normal range of motion, no bony tenderness, no swelling, no effusion, no crepitus, no deformity, no laceration, no pain, no spasm, normal pulse and normal strength.  Neurological: She is alert and oriented to person, place, and time.  Psychiatric: She has a normal mood and affect. Her behavior is normal. Judgment and thought content normal.  Nursing note and vitals reviewed.   BP 116/78 mmHg  Pulse 76  Temp(Src) 98 F (36.7 C) (Oral)  Wt 116 lb 3.2 oz (52.708 kg)  SpO2 95% Wt Readings from Last 3 Encounters:  11/16/14 116 lb 3.2 oz (52.708 kg)  08/13/14 120 lb (54.432 kg)  08/04/14 120 lb 12.8 oz (54.795 kg)     Lab Results  Component Value Date   WBC 5.2 08/05/2014   HGB 14.0 08/05/2014   HCT 41.9 08/05/2014   PLT 213 08/05/2014   GLUCOSE 127* 08/05/2014   CHOL 208* 05/28/2014   TRIG 230.0* 05/28/2014   HDL 42.10 05/28/2014   LDLDIRECT 128.6 05/28/2014   LDLCALC 103* 12/06/2012   ALT 19 08/05/2014   AST 25 08/05/2014   NA 139 08/05/2014   K 3.9 08/05/2014   CL 101 08/05/2014   CREATININE 0.95 08/05/2014   BUN 13 08/05/2014   CO2 24 08/05/2014   TSH 0.055* 08/05/2014   INR 1.02 04/24/2012   HGBA1C 6.3 10/28/2013    Mr Brain Wo Contrast (neuro Protocol)  08/05/2014   CLINICAL DATA:  Altered mental status. Encephalopathy. Diabetes and hypertension and hyperlipidemia. Smoker.  EXAM: MRI HEAD WITHOUT CONTRAST  TECHNIQUE: Multiplanar, multiecho pulse sequences of the brain and surrounding structures were obtained without intravenous contrast.  COMPARISON:  CT head 08/04/2014  FINDINGS: Image quality degraded by moderate motion.  Negative for acute infarct.  Generalized atrophy with prominent  ventricles and subarachnoid space diffusely. Mild chronic microvascular ischemic change in the white matter and pons. No cortical infarct  Negative for mass or edema.  No mass effect or midline shift.  Negative for cerebral hemorrhage.  Mucosal edema in the sphenoid sinus.  IMPRESSION: Atrophy and mild chronic microvascular ischemic change. No acute abnormality.   Electronically Signed   By: Franchot Gallo M.D.   On: 08/05/2014 12:21       Assessment & Plan:   Problem List Items Addressed This Visit    Hyperlipidemia    Check labs Cont lipitor      Relevant Orders   Basic metabolic panel   Hepatic function panel   Lipid panel  MM Digital Diagnostic Bilat   Breast pain in female    Diagnostic mammogram rto if symptoms do not improve--- prob M/S      Relevant Orders   MM Digital Diagnostic Bilat    Other Visit Diagnoses    Hypothyroidism, unspecified hypothyroidism type    -  Primary    Relevant Orders    Basic metabolic panel    TSH    MM Digital Diagnostic Bilat    Anxiety disorder, unspecified anxiety disorder type        Relevant Orders    Basic metabolic panel    MM Digital Diagnostic Bilat    Anemia, iron deficiency        Relevant Orders    Basic metabolic panel    CBC with Differential/Platelet    Vitamin B12    IBC panel    MM Digital Diagnostic Bilat    Osteopenia        Relevant Orders    DG Bone Density        Garnet Koyanagi, DO

## 2014-11-16 NOTE — Assessment & Plan Note (Signed)
Check labs Cont lipitor

## 2014-11-16 NOTE — Patient Instructions (Addendum)
Hypothyroidism The thyroid is a large gland located in the lower front of your neck. The thyroid gland helps control metabolism. Metabolism is how your body handles food. It controls metabolism with the hormone thyroxine. When this gland is underactive (hypothyroid), it produces too little hormone.  CAUSES These include:   Absence or destruction of thyroid tissue.  Goiter due to iodine deficiency.  Goiter due to medications.  Congenital defects (since birth).  Problems with the pituitary. This causes a lack of TSH (thyroid stimulating hormone). This hormone tells the thyroid to turn out more hormone. SYMPTOMS  Lethargy (feeling as though you have no energy)  Cold intolerance  Weight gain (in spite of normal food intake)  Dry skin  Coarse hair  Menstrual irregularity (if severe, may lead to infertility)  Slowing of thought processes Cardiac problems are also caused by insufficient amounts of thyroid hormone. Hypothyroidism in the newborn is cretinism, and is an extreme form. It is important that this form be treated adequately and immediately or it will lead rapidly to retarded physical and mental development. DIAGNOSIS  To prove hypothyroidism, your caregiver may do blood tests and ultrasound tests. Sometimes the signs are hidden. It may be necessary for your caregiver to watch this illness with blood tests either before or after diagnosis and treatment. TREATMENT  Low levels of thyroid hormone are increased by using synthetic thyroid hormone. This is a safe, effective treatment. It usually takes about four weeks to gain the full effects of the medication. After you have the full effect of the medication, it will generally take another four weeks for problems to leave. Your caregiver may start you on low doses. If you have had heart problems the dose may be gradually increased. It is generally not an emergency to get rapidly to normal. HOME CARE INSTRUCTIONS   Take your  medications as your caregiver suggests. Let your caregiver know of any medications you are taking or start taking. Your caregiver will help you with dosage schedules.  As your condition improves, your dosage needs may increase. It will be necessary to have continuing blood tests as suggested by your caregiver.  Report all suspected medication side effects to your caregiver. SEEK MEDICAL CARE IF: Seek medical care if you develop:  Sweating.  Tremulousness (tremors).  Anxiety.  Rapid weight loss.  Heat intolerance.  Emotional swings.  Diarrhea.  Weakness. SEEK IMMEDIATE MEDICAL CARE IF:  You develop chest pain, an irregular heart beat (palpitations), or a rapid heart beat. MAKE SURE YOU:   Understand these instructions.  Will watch your condition.  Will get help right away if you are not doing well or get worse. Document Released: 08/14/2005 Document Revised: 11/06/2011 Document Reviewed: 04/03/2008 [facility] Patient Information 2015 [place], [place]. This information is not intended to replace advice given to you by your health care provider. Make sure you discuss any questions you have with your health care provider.  Can stop protonix and pepcid and take only as needed We need to check a bone density because you are taking fosamax--- Delsa Sale from our office will call you with that appointment. Xanax is also as needed only

## 2014-11-17 LAB — BASIC METABOLIC PANEL
BUN: 15 mg/dL (ref 6–23)
CALCIUM: 9.4 mg/dL (ref 8.4–10.5)
CO2: 27 meq/L (ref 19–32)
Chloride: 103 mEq/L (ref 96–112)
Creatinine, Ser: 0.93 mg/dL (ref 0.40–1.20)
GFR: 62.59 mL/min (ref >60.00)
GLUCOSE: 92 mg/dL (ref 70–99)
POTASSIUM: 4.1 meq/L (ref 3.5–5.1)
SODIUM: 137 meq/L (ref 135–145)

## 2014-11-17 LAB — CBC WITH DIFFERENTIAL/PLATELET
Basophils Absolute: 0 10*3/uL (ref 0.0–0.1)
Basophils Relative: 0.3 % (ref 0.0–3.0)
EOS ABS: 0 10*3/uL (ref 0.0–0.7)
Eosinophils Relative: 0.2 % (ref 0.0–5.0)
HCT: 45.6 % (ref 36.0–46.0)
HEMOGLOBIN: 15.3 g/dL — AB (ref 12.0–15.0)
LYMPHS ABS: 1.9 10*3/uL (ref 0.7–4.0)
LYMPHS PCT: 17.7 % (ref 12.0–46.0)
MCHC: 33.6 g/dL (ref 30.0–36.0)
MCV: 87.6 fl (ref 78.0–100.0)
Monocytes Absolute: 0.6 10*3/uL (ref 0.1–1.0)
Monocytes Relative: 5.6 % (ref 3.0–12.0)
NEUTROS ABS: 8.3 10*3/uL — AB (ref 1.4–7.7)
Neutrophils Relative %: 76.2 % (ref 43.0–77.0)
Platelets: 181 10*3/uL (ref 150.0–400.0)
RBC: 5.2 Mil/uL — ABNORMAL HIGH (ref 3.87–5.11)
RDW: 14.1 % (ref 11.5–15.5)
WBC: 10.9 10*3/uL — ABNORMAL HIGH (ref 4.0–10.5)

## 2014-11-17 LAB — HEPATIC FUNCTION PANEL
ALK PHOS: 88 U/L (ref 39–117)
ALT: 16 U/L (ref 0–35)
AST: 18 U/L (ref 0–37)
Albumin: 3.9 g/dL (ref 3.5–5.2)
BILIRUBIN DIRECT: 0 mg/dL (ref 0.0–0.3)
TOTAL PROTEIN: 7 g/dL (ref 6.0–8.3)
Total Bilirubin: 0.7 mg/dL (ref 0.2–1.2)

## 2014-11-17 LAB — LIPID PANEL
CHOL/HDL RATIO: 4
Cholesterol: 198 mg/dL (ref 0–200)
HDL: 45.9 mg/dL (ref >39.00)
LDL Cholesterol: 126 mg/dL — ABNORMAL HIGH (ref 0–99)
NONHDL: 152.1
Triglycerides: 132 mg/dL (ref 0.0–149.0)
VLDL: 26.4 mg/dL (ref 0.0–40.0)

## 2014-11-17 LAB — IBC PANEL
Iron: 80 ug/dL (ref 42–145)
Saturation Ratios: 29 % (ref 20.0–50.0)
Transferrin: 197 mg/dL — ABNORMAL LOW (ref 212.0–360.0)

## 2014-11-17 LAB — VITAMIN B12: Vitamin B-12: 312 pg/mL (ref 211–911)

## 2014-11-17 LAB — TSH: TSH: 0.14 u[IU]/mL — ABNORMAL LOW (ref 0.35–4.50)

## 2014-11-18 ENCOUNTER — Telehealth: Payer: Self-pay | Admitting: Family Medicine

## 2014-11-18 NOTE — Telephone Encounter (Signed)
pre visit letter sent °

## 2014-11-26 ENCOUNTER — Other Ambulatory Visit: Payer: Self-pay

## 2014-11-26 DIAGNOSIS — Z1231 Encounter for screening mammogram for malignant neoplasm of breast: Secondary | ICD-10-CM

## 2014-12-02 ENCOUNTER — Ambulatory Visit: Payer: Self-pay

## 2014-12-07 ENCOUNTER — Ambulatory Visit (INDEPENDENT_AMBULATORY_CARE_PROVIDER_SITE_OTHER): Payer: Medicare Other | Admitting: Family Medicine

## 2014-12-07 ENCOUNTER — Encounter: Payer: Self-pay | Admitting: Family Medicine

## 2014-12-07 VITALS — BP 130/70 | HR 91 | Temp 97.8°F | Ht <= 58 in | Wt 117.4 lb

## 2014-12-07 DIAGNOSIS — E785 Hyperlipidemia, unspecified: Secondary | ICD-10-CM

## 2014-12-07 DIAGNOSIS — I10 Essential (primary) hypertension: Secondary | ICD-10-CM | POA: Diagnosis not present

## 2014-12-07 DIAGNOSIS — E039 Hypothyroidism, unspecified: Secondary | ICD-10-CM

## 2014-12-07 DIAGNOSIS — Z0000 Encounter for general adult medical examination without abnormal findings: Secondary | ICD-10-CM | POA: Diagnosis not present

## 2014-12-07 DIAGNOSIS — Z23 Encounter for immunization: Secondary | ICD-10-CM

## 2014-12-07 DIAGNOSIS — Z79899 Other long term (current) drug therapy: Secondary | ICD-10-CM | POA: Diagnosis not present

## 2014-12-07 LAB — CBC WITH DIFFERENTIAL/PLATELET
BASOS ABS: 0 10*3/uL (ref 0.0–0.1)
BASOS PCT: 0.2 % (ref 0.0–3.0)
EOS ABS: 0.1 10*3/uL (ref 0.0–0.7)
Eosinophils Relative: 0.4 % (ref 0.0–5.0)
HCT: 45.9 % (ref 36.0–46.0)
Hemoglobin: 15.6 g/dL — ABNORMAL HIGH (ref 12.0–15.0)
LYMPHS PCT: 11.5 % — AB (ref 12.0–46.0)
Lymphs Abs: 1.7 10*3/uL (ref 0.7–4.0)
MCHC: 33.9 g/dL (ref 30.0–36.0)
MCV: 88 fl (ref 78.0–100.0)
Monocytes Absolute: 0.7 10*3/uL (ref 0.1–1.0)
Monocytes Relative: 4.9 % (ref 3.0–12.0)
NEUTROS ABS: 12 10*3/uL — AB (ref 1.4–7.7)
Neutrophils Relative %: 83 % — ABNORMAL HIGH (ref 43.0–77.0)
Platelets: 218 10*3/uL (ref 150.0–400.0)
RBC: 5.22 Mil/uL — AB (ref 3.87–5.11)
RDW: 14 % (ref 11.5–15.5)
WBC: 14.4 10*3/uL — ABNORMAL HIGH (ref 4.0–10.5)

## 2014-12-07 LAB — LIPID PANEL
Cholesterol: 230 mg/dL — ABNORMAL HIGH (ref 0–200)
HDL: 53.1 mg/dL (ref >39.00)
LDL CALC: 145 mg/dL — AB (ref 0–99)
NonHDL: 176.9
Total CHOL/HDL Ratio: 4
Triglycerides: 161 mg/dL — ABNORMAL HIGH (ref 0.0–149.0)
VLDL: 32.2 mg/dL (ref 0.0–40.0)

## 2014-12-07 LAB — MICROALBUMIN / CREATININE URINE RATIO
Creatinine,U: 162.9 mg/dL
Microalb Creat Ratio: 0.6 mg/g (ref 0.0–30.0)
Microalb, Ur: 0.9 mg/dL (ref 0.0–1.9)

## 2014-12-07 LAB — TSH: TSH: 2.04 u[IU]/mL (ref 0.35–4.50)

## 2014-12-07 LAB — BASIC METABOLIC PANEL
BUN: 21 mg/dL (ref 6–23)
CO2: 26 mEq/L (ref 19–32)
Calcium: 9.3 mg/dL (ref 8.4–10.5)
Chloride: 101 mEq/L (ref 96–112)
Creatinine, Ser: 1.01 mg/dL (ref 0.40–1.20)
GFR: 56.89 mL/min — AB (ref >60.00)
Glucose, Bld: 97 mg/dL (ref 70–99)
Potassium: 3.6 mEq/L (ref 3.5–5.1)
Sodium: 138 mEq/L (ref 135–145)

## 2014-12-07 LAB — HEPATIC FUNCTION PANEL
ALT: 18 U/L (ref 0–35)
AST: 20 U/L (ref 0–37)
Albumin: 3.9 g/dL (ref 3.5–5.2)
Alkaline Phosphatase: 85 U/L (ref 39–117)
BILIRUBIN DIRECT: 0.1 mg/dL (ref 0.0–0.3)
BILIRUBIN TOTAL: 0.7 mg/dL (ref 0.2–1.2)
TOTAL PROTEIN: 7.1 g/dL (ref 6.0–8.3)

## 2014-12-07 LAB — POCT URINALYSIS DIPSTICK
Bilirubin, UA: NEGATIVE
Blood, UA: NEGATIVE
Glucose, UA: NEGATIVE
Ketones, UA: NEGATIVE
LEUKOCYTES UA: NEGATIVE
NITRITE UA: NEGATIVE
PH UA: 6
PROTEIN UA: NEGATIVE
Spec Grav, UA: >=1.03
Urobilinogen, UA: 4

## 2014-12-07 MED ORDER — TETANUS-DIPHTH-ACELL PERTUSSIS 5-2.5-18.5 LF-MCG/0.5 IM SUSP: 0.5000 mL | Freq: Once | INTRAMUSCULAR | Status: DC

## 2014-12-07 NOTE — Progress Notes (Signed)
Subjective:   Monica Copeland is a 74 y.o. female who presents for Medicare Annual (Subsequent) preventive examination. She is als0 here for f/u and med refills-----cholesterol, thyroid etc Review of Systems:   Review of Systems  Constitutional: Negative for activity change, appetite change and fatigue.  HENT: Negative for hearing loss, congestion, tinnitus and ear discharge.   Eyes: Negative for visual disturbance (see optho q1y -- vision corrected to 20/20 with glasses).  Respiratory: Negative for cough, chest tightness and shortness of breath.   Cardiovascular: Negative for chest pain, palpitations and leg swelling.  Gastrointestinal: Negative for abdominal pain, diarrhea, constipation and abdominal distention.  Genitourinary: Negative for urgency, frequency, decreased urine volume and difficulty urinating.  Musculoskeletal: Negative for back pain, arthralgias and gait problem.  Skin: Negative for color change, pallor and rash.  Neurological: Negative for dizziness, light-headedness, numbness and headaches.  Hematological: Negative for adenopathy. Does not bruise/bleed easily.  Psychiatric/Behavioral: Negative for suicidal ideas, confusion, sleep disturbance, self-injury, dysphoric mood, decreased concentration and agitation.  Pt is able to read and write and can do all ADLs No risk for falling No abuse/ violence in home           Objective:     Vitals: BP 130/70 mmHg  Pulse 91  Temp(Src) 97.8 F (36.6 C) (Oral)  Ht 4\' 10"  (1.473 m)  Wt 117 lb 6.4 oz (53.252 kg)  BMI 24.54 kg/m2  SpO2 94% BP 130/70 mmHg  Pulse 91  Temp(Src) 97.8 F (36.6 C) (Oral)  Ht 4\' 10"  (1.473 m)  Wt 117 lb 6.4 oz (53.252 kg)  BMI 24.54 kg/m2  SpO2 94% General appearance: alert, cooperative, appears stated age and no distress Head: Normocephalic, without obvious abnormality, atraumatic Eyes: negative findings: lids and lashes normal, conjunctivae and sclerae normal and pupils equal,  round, reactive to light and accomodation, positive findings: visual field defect per opth Ears: normal TM's and external ear canals both ears Nose: Nares normal. Septum midline. Mucosa normal. No drainage or sinus tenderness. Throat: lips, mucosa, and tongue normal; teeth and gums normal Neck: no adenopathy, no carotid bruit, no JVD, supple, symmetrical, trachea midline and thyroid not enlarged, symmetric, no tenderness/mass/nodules Back: symmetric, no curvature. ROM normal. No CVA tenderness. Lungs: clear to auscultation bilaterally Breasts: normal appearance, no masses or tenderness Heart: regular rate and rhythm, S1, S2 normal, no murmur, click, rub or gallop Abdomen: soft, non-tender; bowel sounds normal; no masses,  no organomegaly Pelvic: not indicated; post-menopausal, no abnormal Pap smears in past Extremities: extremities normal, atraumatic, no cyanosis or edema Pulses: 2+ and symmetric Skin: Skin color, texture, turgor normal. No rashes or lesions Lymph nodes: Cervical, supraclavicular, and axillary nodes normal. Neurologic: Alert and oriented X 3, normal strength and tone. Normal symmetric reflexes. Normal coordination and gait Psych- no depression, no anxiety Tobacco History  Smoking status  . Current Every Day Smoker -- 0.75 packs/day  . Types: Cigarettes  Smokeless tobacco  . Never Used    Comment: Started smoking at age 32.form given 04-02-13     Ready to quit: Not Answered Counseling given: Not Answered   Past Medical History  Diagnosis Date  . Hypertension   . Hyperlipemia   . Asthma   . Diabetes mellitus without complication   . Asthma   . Back pain   . Seizures   . Anxiety   . Cushing disease   . GERD (gastroesophageal reflux disease) 01/24/2012  . Emphysema of lung   . Hyperlipidemia   .  Ulcer   . Hemorrhoid    Past Surgical History  Procedure Laterality Date  . 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 Colon surgery      colon perforation  .  Appendectomy    . Candida esophagitis    . Pylorus ulcer    . [place]   Family History  Problem Relation Age of Onset  . Hypertension Mother   . Hypertension Brother   . Heart attack Mother   . Diabetes Father   . Asthma Father   . Asthma Son   . Lung cancer Father   . Esophageal cancer Brother   . Liver cancer Brother     mets from esophagus  . Colon cancer Neg Hx    History  Sexual Activity  . Sexual Activity: Not Currently    Outpatient Encounter Prescriptions as of 12/07/2014  Medication Sig  . alendronate (FOSAMAX) 70 MG tablet Take 1 tablet (70 mg total) by mouth every 7 (seven) days. Take with a full glass of water on an empty stomach.  . ALPRAZolam (XANAX) 0.5 MG tablet 1/2- 1 po bid prn  . atorvastatin (LIPITOR) 40 MG tablet TAKE 1 TABLET BY MOUTH DAILY  . budesonide-formoterol (SYMBICORT) 80-4.5 MCG/ACT inhaler Inhale 2 puffs into the lungs as needed.  . cyclobenzaprine (FLEXERIL) 10 MG tablet 1/2 tab po tid prn  . famotidine (PEPCID) 20 MG tablet Take 1 tablet (20 mg total) by mouth 2 (two) times daily.  . feeding supplement, ENSURE COMPLETE, (ENSURE COMPLETE) LIQD Take 237 mLs by mouth 2 (two) times daily between meals.  . ferrous sulfate 325 (65 FE) MG tablet TAKE 1 TABLET BY MOUTH TWICE DAILY BEFORE LUNCH AND SUPPER  . FLUoxetine (PROZAC) 20 MG tablet Take 1 tablet (20 mg total) by mouth daily.  . hyoscyamine (LEVBID) 0.375 MG 12 hr tablet Take 1 tablet (0.375 mg total) by mouth 2 (two) times daily.  Marland Kitchen levothyroxine (SYNTHROID, LEVOTHROID) 25 MCG tablet Take 1 tablet (25 mcg total) by mouth daily before breakfast.  . meloxicam (MOBIC) 7.5 MG tablet Take 1 tablet (7.5 mg total) by mouth daily.  . pantoprazole (PROTONIX) 40 MG tablet TAKE 1 TABLET BY MOUTH TWICE DAILY  . potassium chloride SA (K-DUR,KLOR-CON) 20 MEQ tablet Take 1 tablet (20 mEq total) by mouth daily.  . pramipexole (MIRAPEX) 1 MG tablet TAKE 1 TABLET BY MOUTH EVERY NIGHT AT  BEDTIME  . predniSONE (DELTASONE) 5 MG tablet TAKE 1 TABLET BY MOUTH ONCE DAILY.  Marland Kitchen PROAIR HFA 108 (90 BASE) MCG/ACT inhaler Inhale 1-2 puffs into the lungs every 6 (six) hours as needed for wheezing or shortness of breath.   . Tdap (BOOSTRIX) 5-2.5-18.5 LF-MCG/0.5 injection Inject 0.5 mLs into the muscle once.    Activities of Daily Living In your present state of health, do you have any difficulty performing the following activities: 08/05/2014 08/04/2014  Hearing? [place]? Y Y  Difficulty concentrating or making decisions? [place]  Walking or climbing stairs? - Y  Dressing or bathing? Y Y  Doing errands, shopping? - Y    Patient Care Team: Rosalita Chessman, DO as PCP - General (Family Medicine) Clent Jacks, MD as Consulting Physician (Ophthalmology)    Assessment:    CPE Exercise Activities and Dietary recommendations--- walks daily    Goals    . Quit smoking / using tobacco      Fall Risk Fall Risk  11/16/2014  10/28/2013 10/28/2013 10/09/2013  Falls in the past year? No No Yes No   Depression Screen PHQ 2/9 Scores 11/16/2014 10/28/2013 10/28/2013 10/09/2013  PHQ - 2 Score 0 0 0 0     Cognitive Testing No flowsheet data found.  Immunization History  Administered Date(s) Administered  . Influenza Whole 07/01/2012  . Influenza,inj,Quad PF,36+ Mos 05/08/2013, 08/05/2014  . Pneumococcal Polysaccharide-23 05/28/2014   Screening Tests Health Maintenance  Topic Date Due  . TETANUS/TDAP  08/06/1960  . MAMMOGRAM  11/27/2014  . INFLUENZA VACCINE  03/29/2015  . PNA vac Low Risk Adult (2 of 2 - PCV13) 05/29/2015  . COLONOSCOPY  04/18/2023  . DEXA SCAN  Completed  . ZOSTAVAX  Addressed      Plan:    ghm utd Check labs During the course of the visit the patient was educated and counseled about the following appropriate screening and preventive services:   Vaccines to include Pneumoccal, Influenza, Hepatitis B, Td, Zostavax, HCV  Electrocardiogram  Cardiovascular  Disease  Colorectal cancer screening  Bone density screening  Diabetes screening  Glaucoma screening  Mammography/PAP  Nutrition counseling   Patient Instructions (the written plan) was given to the patient.  1. Need for tetanus booster  - Tdap (BOOSTRIX) 5-2.5-18.5 LF-MCG/0.5 injection; Inject 0.5 mLs into the muscle once.  Dispense: 0.5 mL; Refill: 0  2. Hypothyroidism, unspecified hypothyroidism type Check labs - Basic metabolic panel - CBC with Differential/Platelet - TSH  3. Essential hypertension Stable,  - Basic metabolic panel - CBC with Differential/Platelet - POCT urinalysis dipstick - TSH - Microalbumin / creatinine urine ratio  4. Hyperlipidemia Check labs, con't lipitor - Basic metabolic panel - CBC with Differential/Platelet - Hepatic function panel - Lipid panel  5. Preventative health care    Garnet Koyanagi, DO  12/07/2014

## 2014-12-07 NOTE — Progress Notes (Signed)
Pre visit review using our clinic review tool, if applicable. No additional management support is needed unless otherwise documented below in the visit note. 

## 2014-12-07 NOTE — Patient Instructions (Signed)
Preventive Care for Adults A healthy lifestyle and preventive care can promote health and wellness. Preventive health guidelines for women include the following key practices.  A routine yearly physical is a good way to check with your health care provider about your health and preventive screening. It is a chance to share any concerns and updates on your health and to receive a thorough exam.  Visit your dentist for a routine exam and preventive care every 6 months. Brush your teeth twice a day and floss once a day. Good oral hygiene prevents tooth decay and gum disease.  The frequency of eye exams is based on your age, health, family medical history, use of contact lenses, and other factors. Follow your health care provider's recommendations for frequency of eye exams.  Eat a healthy diet. Foods like vegetables, fruits, whole grains, low-fat dairy products, and lean protein foods contain the nutrients you need without too many calories. Decrease your intake of foods high in solid fats, added sugars, and salt. Eat the right amount of calories for you.Get information about a proper diet from your health care provider, if necessary.  Regular physical exercise is one of the most important things you can do for your health. Most adults should get at least 150 minutes of moderate-intensity exercise (any activity that increases your heart rate and causes you to sweat) each week. In addition, most adults need muscle-strengthening exercises on 2 or more days a week.  Maintain a healthy weight. The body mass index (BMI) is a screening tool to identify possible weight problems. It provides an estimate of body fat based on height and weight. Your health care provider can find your BMI and can help you achieve or maintain a healthy weight.For adults 20 years and older:  A BMI below 18.5 is considered underweight.  A BMI of 18.5 to 24.9 is normal.  A BMI of 25 to 29.9 is considered overweight.  A BMI of  30 and above is considered obese.  Maintain normal blood lipids and cholesterol levels by exercising and minimizing your intake of saturated fat. Eat a balanced diet with plenty of fruit and vegetables. Blood tests for lipids and cholesterol should begin at age 76 and be repeated every 5 years. If your lipid or cholesterol levels are high, you are over 50, or you are at high risk for heart disease, you may need your cholesterol levels checked more frequently.Ongoing high lipid and cholesterol levels should be treated with medicines if diet and exercise are not working.  If you smoke, find out from your health care provider how to quit. If you do not use tobacco, do not start.  Lung cancer screening is recommended for adults aged 22-80 years who are at high risk for developing lung cancer because of a history of smoking. A yearly low-dose CT scan of the lungs is recommended for people who have at least a 30-pack-year history of smoking and are a current smoker or have quit within the past 15 years. A pack year of smoking is smoking an average of 1 pack of cigarettes a day for 1 year (for example: 1 pack a day for 30 years or 2 packs a day for 15 years). Yearly screening should continue until the smoker has stopped smoking for at least 15 years. Yearly screening should be stopped for people who develop a health problem that would prevent them from having lung cancer treatment.  If you are pregnant, do not drink alcohol. If you are breastfeeding,  be very cautious about drinking alcohol. If you are not pregnant and choose to drink alcohol, do not have more than 1 drink per day. One drink is considered to be 12 ounces (355 mL) of beer, 5 ounces (148 mL) of wine, or 1.5 ounces (44 mL) of liquor.  Avoid use of street drugs. Do not share needles with anyone. Ask for help if you need support or instructions about stopping the use of drugs.  High blood pressure causes heart disease and increases the risk of  stroke. Your blood pressure should be checked at least every 1 to 2 years. Ongoing high blood pressure should be treated with medicines if weight loss and exercise do not work.  If you are 3-86 years old, ask your health care provider if you should take aspirin to prevent strokes.  Diabetes screening involves taking a blood sample to check your fasting blood sugar level. This should be done once every 3 years, after age 67, if you are within normal weight and without risk factors for diabetes. Testing should be considered at a younger age or be carried out more frequently if you are overweight and have at least 1 risk factor for diabetes.  Breast cancer screening is essential preventive care for women. You should practice "breast self-awareness." This means understanding the normal appearance and feel of your breasts and may include breast self-examination. Any changes detected, no matter how small, should be reported to a health care provider. Women in their 8s and 30s should have a clinical breast exam (CBE) by a health care provider as part of a regular health exam every 1 to 3 years. After age 70, women should have a CBE every year. Starting at age 25, women should consider having a mammogram (breast X-ray test) every year. Women who have a family history of breast cancer should talk to their health care provider about genetic screening. Women at a high risk of breast cancer should talk to their health care providers about having an MRI and a mammogram every year.  Breast cancer gene (BRCA)-related cancer risk assessment is recommended for women who have family members with BRCA-related cancers. BRCA-related cancers include breast, ovarian, tubal, and peritoneal cancers. Having family members with these cancers may be associated with an increased risk for harmful changes (mutations) in the breast cancer genes BRCA1 and BRCA2. Results of the assessment will determine the need for genetic counseling and  BRCA1 and BRCA2 testing.  Routine pelvic exams to screen for cancer are no longer recommended for nonpregnant women who are considered low risk for cancer of the pelvic organs (ovaries, uterus, and vagina) and who do not have symptoms. Ask your health care provider if a screening pelvic exam is right for you.  If you have had past treatment for cervical cancer or a condition that could lead to cancer, you need Pap tests and screening for cancer for at least 20 years after your treatment. If Pap tests have been discontinued, your risk factors (such as having a new sexual partner) need to be reassessed to determine if screening should be resumed. Some women have medical problems that increase the chance of getting cervical cancer. In these cases, your health care provider may recommend more frequent screening and Pap tests.  The HPV test is an additional test that may be used for cervical cancer screening. The HPV test looks for the virus that can cause the cell changes on the cervix. The cells collected during the Pap test can be  tested for HPV. The HPV test could be used to screen women aged 30 years and older, and should be used in women of any age who have unclear Pap test results. After the age of 30, women should have HPV testing at the same frequency as a Pap test.  Colorectal cancer can be detected and often prevented. Most routine colorectal cancer screening begins at the age of 50 years and continues through age 75 years. However, your health care provider may recommend screening at an earlier age if you have risk factors for colon cancer. On a yearly basis, your health care provider may provide home test kits to check for hidden blood in the stool. Use of a small camera at the end of a tube, to directly examine the colon (sigmoidoscopy or colonoscopy), can detect the earliest forms of colorectal cancer. Talk to your health care provider about this at age 50, when routine screening begins. Direct  exam of the colon should be repeated every 5-10 years through age 75 years, unless early forms of pre-cancerous polyps or small growths are found.  People who are at an increased risk for hepatitis B should be screened for this virus. You are considered at high risk for hepatitis B if:  You were born in a country where hepatitis B occurs often. Talk with your health care provider about which countries are considered high risk.  Your parents were born in a high-risk country and you have not received a shot to protect against hepatitis B (hepatitis B vaccine).  You have HIV or AIDS.  You use needles to inject street drugs.  You live with, or have sex with, someone who has hepatitis B.  You get hemodialysis treatment.  You take certain medicines for conditions like cancer, organ transplantation, and autoimmune conditions.  Hepatitis C blood testing is recommended for all people born from 1945 through 1965 and any individual with known risks for hepatitis C.  Practice safe sex. Use condoms and avoid high-risk sexual practices to reduce the spread of sexually transmitted infections (STIs). STIs include gonorrhea, chlamydia, syphilis, trichomonas, herpes, HPV, and human immunodeficiency virus (HIV). Herpes, HIV, and HPV are viral illnesses that have no cure. They can result in disability, cancer, and death.  You should be screened for sexually transmitted illnesses (STIs) including gonorrhea and chlamydia if:  You are sexually active and are younger than 24 years.  You are older than 24 years and your health care provider tells you that you are at risk for this type of infection.  Your sexual activity has changed since you were last screened and you are at an increased risk for chlamydia or gonorrhea. Ask your health care provider if you are at risk.  If you are at risk of being infected with HIV, it is recommended that you take a prescription medicine daily to prevent HIV infection. This is  called preexposure prophylaxis (PrEP). You are considered at risk if:  You are a heterosexual woman, are sexually active, and are at increased risk for HIV infection.  You take drugs by injection.  You are sexually active with a partner who has HIV.  Talk with your health care provider about whether you are at high risk of being infected with HIV. If you choose to begin PrEP, you should first be tested for HIV. You should then be tested every 3 months for as long as you are taking PrEP.  Osteoporosis is a disease in which the bones lose minerals and strength   with aging. This can result in serious bone fractures or breaks. The risk of osteoporosis can be identified using a bone density scan. Women ages 65 years and over and women at risk for fractures or osteoporosis should discuss screening with their health care providers. Ask your health care provider whether you should take a calcium supplement or vitamin D to reduce the rate of osteoporosis.  Menopause can be associated with physical symptoms and risks. Hormone replacement therapy is available to decrease symptoms and risks. You should talk to your health care provider about whether hormone replacement therapy is right for you.  Use sunscreen. Apply sunscreen liberally and repeatedly throughout the day. You should seek shade when your shadow is shorter than you. Protect yourself by wearing long sleeves, pants, a wide-brimmed hat, and sunglasses year round, whenever you are outdoors.  Once a month, do a whole body skin exam, using a mirror to look at the skin on your back. Tell your health care provider of new moles, moles that have irregular borders, moles that are larger than a pencil eraser, or moles that have changed in shape or color.  Stay current with required vaccines (immunizations).  Influenza vaccine. All adults should be immunized every year.  Tetanus, diphtheria, and acellular pertussis (Td, Tdap) vaccine. Pregnant women should  receive 1 dose of Tdap vaccine during each pregnancy. The dose should be obtained regardless of the length of time since the last dose. Immunization is preferred during the 27th-36th week of gestation. An adult who has not previously received Tdap or who does not know her vaccine status should receive 1 dose of Tdap. This initial dose should be followed by tetanus and diphtheria toxoids (Td) booster doses every 10 years. Adults with an unknown or incomplete history of completing a 3-dose immunization series with Td-containing vaccines should begin or complete a primary immunization series including a Tdap dose. Adults should receive a Td booster every 10 years.  Varicella vaccine. An adult without evidence of immunity to varicella should receive 2 doses or a second dose if she has previously received 1 dose. Pregnant females who do not have evidence of immunity should receive the first dose after pregnancy. This first dose should be obtained before leaving the health care facility. The second dose should be obtained 4-8 weeks after the first dose.  Human papillomavirus (HPV) vaccine. Females aged 13-26 years who have not received the vaccine previously should obtain the 3-dose series. The vaccine is not recommended for use in pregnant females. However, pregnancy testing is not needed before receiving a dose. If a female is found to be pregnant after receiving a dose, no treatment is needed. In that case, the remaining doses should be delayed until after the pregnancy. Immunization is recommended for any person with an immunocompromised condition through the age of 26 years if she did not get any or all doses earlier. During the 3-dose series, the second dose should be obtained 4-8 weeks after the first dose. The third dose should be obtained 24 weeks after the first dose and 16 weeks after the second dose.  Zoster vaccine. One dose is recommended for adults aged 60 years or older unless certain conditions are  present.  Measles, mumps, and rubella (MMR) vaccine. Adults born before 1957 generally are considered immune to measles and mumps. Adults born in 1957 or later should have 1 or more doses of MMR vaccine unless there is a contraindication to the vaccine or there is laboratory evidence of immunity to   each of the three diseases. A routine second dose of MMR vaccine should be obtained at least 28 days after the first dose for students attending postsecondary schools, health care workers, or international travelers. People who received inactivated measles vaccine or an unknown type of measles vaccine during 1963-1967 should receive 2 doses of MMR vaccine. People who received inactivated mumps vaccine or an unknown type of mumps vaccine before 1979 and are at high risk for mumps infection should consider immunization with 2 doses of MMR vaccine. For females of childbearing age, rubella immunity should be determined. If there is no evidence of immunity, females who are not pregnant should be vaccinated. If there is no evidence of immunity, females who are pregnant should delay immunization until after pregnancy. Unvaccinated health care workers born before 1957 who lack laboratory evidence of measles, mumps, or rubella immunity or laboratory confirmation of disease should consider measles and mumps immunization with 2 doses of MMR vaccine or rubella immunization with 1 dose of MMR vaccine.  Pneumococcal 13-valent conjugate (PCV13) vaccine. When indicated, a person who is uncertain of her immunization history and has no record of immunization should receive the PCV13 vaccine. An adult aged 19 years or older who has certain medical conditions and has not been previously immunized should receive 1 dose of PCV13 vaccine. This PCV13 should be followed with a dose of pneumococcal polysaccharide (PPSV23) vaccine. The PPSV23 vaccine dose should be obtained at least 8 weeks after the dose of PCV13 vaccine. An adult aged 19  years or older who has certain medical conditions and previously received 1 or more doses of PPSV23 vaccine should receive 1 dose of PCV13. The PCV13 vaccine dose should be obtained 1 or more years after the last PPSV23 vaccine dose.  Pneumococcal polysaccharide (PPSV23) vaccine. When PCV13 is also indicated, PCV13 should be obtained first. All adults aged 65 years and older should be immunized. An adult younger than age 65 years who has certain medical conditions should be immunized. Any person who resides in a nursing home or long-term care facility should be immunized. An adult smoker should be immunized. People with an immunocompromised condition and certain other conditions should receive both PCV13 and PPSV23 vaccines. People with human immunodeficiency virus (HIV) infection should be immunized as soon as possible after diagnosis. Immunization during chemotherapy or radiation therapy should be avoided. Routine use of PPSV23 vaccine is not recommended for American Indians, [place] Natives, or people younger than 65 years unless there are medical conditions that require PPSV23 vaccine. When indicated, people who have unknown immunization and have no record of immunization should receive PPSV23 vaccine. One-time revaccination 5 years after the first dose of PPSV23 is recommended for people aged 19-64 years who have chronic kidney failure, nephrotic syndrome, asplenia, or immunocompromised conditions. People who received 1-2 doses of PPSV23 before age 65 years should receive another dose of PPSV23 vaccine at age 65 years or later if at least 5 years have passed since the previous dose. Doses of PPSV23 are not needed for people immunized with PPSV23 at or after age 65 years.  Meningococcal vaccine. Adults with asplenia or persistent complement component deficiencies should receive 2 doses of quadrivalent meningococcal conjugate (MenACWY-D) vaccine. The doses should be obtained at least 2 months apart.  Microbiologists working with certain meningococcal bacteria, military recruits, people at risk during an outbreak, and people who travel to or live in countries with a high rate of meningitis should be immunized. A first-year college student up through age   21 years who is living in a residence hall should receive a dose if she did not receive a dose on or after her 16th birthday. Adults who have certain high-risk conditions should receive one or more doses of vaccine.  Hepatitis A vaccine. Adults who wish to be protected from this disease, have certain high-risk conditions, work with hepatitis A-infected animals, work in hepatitis A research labs, or travel to or work in countries with a high rate of hepatitis A should be immunized. Adults who were previously unvaccinated and who anticipate close contact with an international adoptee during the first 60 days after arrival in the [place] from a country with a high rate of hepatitis A should be immunized.  Hepatitis B vaccine. Adults who wish to be protected from this disease, have certain high-risk conditions, may be exposed to blood or other infectious body fluids, are household contacts or sex partners of hepatitis B positive people, are clients or workers in certain care facilities, or travel to or work in countries with a high rate of hepatitis B should be immunized.  Haemophilus influenzae type b (Hib) vaccine. A previously unvaccinated person with asplenia or sickle cell disease or having a scheduled splenectomy should receive 1 dose of Hib vaccine. Regardless of previous immunization, a recipient of a hematopoietic stem cell transplant should receive a 3-dose series 6-12 months after her successful transplant. Hib vaccine is not recommended for adults with HIV infection. Preventive Services / Frequency Ages 64 to 68 years  Blood pressure check.** / Every 1 to 2 years.  Lipid and cholesterol check.** / Every 5 years beginning at age  22.  Clinical breast exam.** / Every 3 years for women in their 88s and 53s.  BRCA-related cancer risk assessment.** / For women who have family members with a BRCA-related cancer (breast, ovarian, tubal, or peritoneal cancers).  Pap test.** / Every 2 years from ages 90 through 51. Every 3 years starting at age 21 through age 56 or 3 with a history of 3 consecutive normal Pap tests.  HPV screening.** / Every 3 years from ages 24 through ages 1 to 46 with a history of 3 consecutive normal Pap tests.  Hepatitis C blood test.** / For any individual with known risks for hepatitis C.  Skin self-exam. / Monthly.  Influenza vaccine. / Every year.  Tetanus, diphtheria, and acellular pertussis (Tdap, Td) vaccine.** / Consult your health care provider. Pregnant women should receive 1 dose of Tdap vaccine during each pregnancy. 1 dose of Td every 10 years.  Varicella vaccine.** / Consult your health care provider. Pregnant females who do not have evidence of immunity should receive the first dose after pregnancy.  HPV vaccine. / 3 doses over 6 months, if 72 and younger. The vaccine is not recommended for use in pregnant females. However, pregnancy testing is not needed before receiving a dose.  Measles, mumps, rubella (MMR) vaccine.** / You need at least 1 dose of MMR if you were born in 1957 or later. You may also need a 2nd dose. For females of childbearing age, rubella immunity should be determined. If there is no evidence of immunity, females who are not pregnant should be vaccinated. If there is no evidence of immunity, females who are pregnant should delay immunization until after pregnancy.  Pneumococcal 13-valent conjugate (PCV13) vaccine.** / Consult your health care provider.  Pneumococcal polysaccharide (PPSV23) vaccine.** / 1 to 2 doses if you smoke cigarettes or if you have certain conditions.  Meningococcal vaccine.** /  1 dose if you are age 19 to 21 years and a first-year college  student living in a residence hall, or have one of several medical conditions, you need to get vaccinated against meningococcal disease. You may also need additional booster doses.  Hepatitis A vaccine.** / Consult your health care provider.  Hepatitis B vaccine.** / Consult your health care provider.  Haemophilus influenzae type b (Hib) vaccine.** / Consult your health care provider. Ages 40 to 64 years  Blood pressure check.** / Every 1 to 2 years.  Lipid and cholesterol check.** / Every 5 years beginning at age 20 years.  Lung cancer screening. / Every year if you are aged 55-80 years and have a 30-pack-year history of smoking and currently smoke or have quit within the past 15 years. Yearly screening is stopped once you have quit smoking for at least 15 years or develop a health problem that would prevent you from having lung cancer treatment.  Clinical breast exam.** / Every year after age 40 years.  BRCA-related cancer risk assessment.** / For women who have family members with a BRCA-related cancer (breast, ovarian, tubal, or peritoneal cancers).  Mammogram.** / Every year beginning at age 40 years and continuing for as long as you are in good health. Consult with your health care provider.  Pap test.** / Every 3 years starting at age 30 years through age 65 or 70 years with a history of 3 consecutive normal Pap tests.  HPV screening.** / Every 3 years from ages 30 years through ages 65 to 70 years with a history of 3 consecutive normal Pap tests.  Fecal occult blood test (FOBT) of stool. / Every year beginning at age 50 years and continuing until age 75 years. You may not need to do this test if you get a colonoscopy every 10 years.  Flexible sigmoidoscopy or colonoscopy.** / Every 5 years for a flexible sigmoidoscopy or every 10 years for a colonoscopy beginning at age 50 years and continuing until age 75 years.  Hepatitis C blood test.** / For all people born from 1945 through  1965 and any individual with known risks for hepatitis C.  Skin self-exam. / Monthly.  Influenza vaccine. / Every year.  Tetanus, diphtheria, and acellular pertussis (Tdap/Td) vaccine.** / Consult your health care provider. Pregnant women should receive 1 dose of Tdap vaccine during each pregnancy. 1 dose of Td every 10 years.  Varicella vaccine.** / Consult your health care provider. Pregnant females who do not have evidence of immunity should receive the first dose after pregnancy.  Zoster vaccine.** / 1 dose for adults aged 60 years or older.  Measles, mumps, rubella (MMR) vaccine.** / You need at least 1 dose of MMR if you were born in 1957 or later. You may also need a 2nd dose. For females of childbearing age, rubella immunity should be determined. If there is no evidence of immunity, females who are not pregnant should be vaccinated. If there is no evidence of immunity, females who are pregnant should delay immunization until after pregnancy.  Pneumococcal 13-valent conjugate (PCV13) vaccine.** / Consult your health care provider.  Pneumococcal polysaccharide (PPSV23) vaccine.** / 1 to 2 doses if you smoke cigarettes or if you have certain conditions.  Meningococcal vaccine.** / Consult your health care provider.  Hepatitis A vaccine.** / Consult your health care provider.  Hepatitis B vaccine.** / Consult your health care provider.  Haemophilus influenzae type b (Hib) vaccine.** / Consult your health care provider. Ages 65   years and over  Blood pressure check.** / Every 1 to 2 years.  Lipid and cholesterol check.** / Every 5 years beginning at age 22 years.  Lung cancer screening. / Every year if you are aged 73-80 years and have a 30-pack-year history of smoking and currently smoke or have quit within the past 15 years. Yearly screening is stopped once you have quit smoking for at least 15 years or develop a health problem that would prevent you from having lung cancer  treatment.  Clinical breast exam.** / Every year after age 4 years.  BRCA-related cancer risk assessment.** / For women who have family members with a BRCA-related cancer (breast, ovarian, tubal, or peritoneal cancers).  Mammogram.** / Every year beginning at age 40 years and continuing for as long as you are in good health. Consult with your health care provider.  Pap test.** / Every 3 years starting at age 9 years through age 34 or 91 years with 3 consecutive normal Pap tests. Testing can be stopped between 65 and 70 years with 3 consecutive normal Pap tests and no abnormal Pap or HPV tests in the past 10 years.  HPV screening.** / Every 3 years from ages 57 years through ages 64 or 45 years with a history of 3 consecutive normal Pap tests. Testing can be stopped between 65 and 70 years with 3 consecutive normal Pap tests and no abnormal Pap or HPV tests in the past 10 years.  Fecal occult blood test (FOBT) of stool. / Every year beginning at age 15 years and continuing until age 17 years. You may not need to do this test if you get a colonoscopy every 10 years.  Flexible sigmoidoscopy or colonoscopy.** / Every 5 years for a flexible sigmoidoscopy or every 10 years for a colonoscopy beginning at age 86 years and continuing until age 71 years.  Hepatitis C blood test.** / For all people born from [birth year] through 1966 and any individual with known risks for hepatitis C.  Osteoporosis screening.** / A one-time screening for women ages 83 years and over and women at risk for fractures or osteoporosis.  Skin self-exam. / Monthly.  Influenza vaccine. / Every year.  Tetanus, diphtheria, and acellular pertussis (Tdap/Td) vaccine.** / 1 dose of Td every 10 years.  Varicella vaccine.** / Consult your health care provider.  Zoster vaccine.** / 1 dose for adults aged 61 years or older.  Pneumococcal 13-valent conjugate (PCV13) vaccine.** / Consult your health care provider.  Pneumococcal  polysaccharide (PPSV23) vaccine.** / 1 dose for all adults aged 28 years and older.  Meningococcal vaccine.** / Consult your health care provider.  Hepatitis A vaccine.** / Consult your health care provider.  Hepatitis B vaccine.** / Consult your health care provider.  Haemophilus influenzae type b (Hib) vaccine.** / Consult your health care provider. ** Family history and personal history of risk and conditions may change your health care provider's recommendations. Document Released: 10/10/2001 Document Revised: 12/29/2013 Document Reviewed: 01/09/2011 [facility] Patient Information 2015 [place], [place]. This information is not intended to replace advice given to you by your health care provider. Make sure you discuss any questions you have with your health care provider.

## 2014-12-08 LAB — THYROID PANEL WITH TSH
Free Thyroxine Index: 1.2 — ABNORMAL LOW (ref 1.4–3.8)
T3 Uptake: 27 % (ref 22–35)
T4, Total: 4.6 ug/dL (ref 4.5–12.0)
TSH: 1.34 u[IU]/mL (ref 0.350–4.500)

## 2014-12-10 ENCOUNTER — Other Ambulatory Visit: Payer: Self-pay

## 2014-12-10 MED ORDER — FENOFIBRATE 160 MG PO TABS: 160.0000 mg | ORAL_TABLET | Freq: Every day | ORAL | Status: DC

## 2014-12-30 ENCOUNTER — Other Ambulatory Visit: Payer: Self-pay | Admitting: Family Medicine

## 2014-12-30 DIAGNOSIS — Z1231 Encounter for screening mammogram for malignant neoplasm of breast: Secondary | ICD-10-CM

## 2015-01-12 ENCOUNTER — Ambulatory Visit (HOSPITAL_BASED_OUTPATIENT_CLINIC_OR_DEPARTMENT_OTHER): Payer: Self-pay

## 2015-01-12 DIAGNOSIS — R921 Mammographic calcification found on diagnostic imaging of breast: Secondary | ICD-10-CM | POA: Diagnosis not present

## 2015-01-18 ENCOUNTER — Telehealth: Payer: Self-pay | Admitting: Family Medicine

## 2015-01-18 ENCOUNTER — Ambulatory Visit (INDEPENDENT_AMBULATORY_CARE_PROVIDER_SITE_OTHER): Payer: Medicare Other | Admitting: Medical

## 2015-01-18 ENCOUNTER — Ambulatory Visit (HOSPITAL_BASED_OUTPATIENT_CLINIC_OR_DEPARTMENT_OTHER)
Admission: RE | Admit: 2015-01-18 | Discharge: 2015-01-18 | Disposition: A | Discharge status: Home / Self Care | Payer: Medicare Other | Source: Ambulatory Visit | Attending: Medical | Admitting: Medical

## 2015-01-18 ENCOUNTER — Other Ambulatory Visit: Payer: Self-pay | Admitting: Medical

## 2015-01-18 ENCOUNTER — Encounter: Payer: Self-pay | Admitting: Medical

## 2015-01-18 VITALS — BP 120/72 | HR 72 | Temp 96.8°F | Wt 121.6 lb

## 2015-01-18 DIAGNOSIS — M25861 Other specified joint disorders, right knee: Secondary | ICD-10-CM | POA: Diagnosis not present

## 2015-01-18 DIAGNOSIS — M25561 Pain in right knee: Secondary | ICD-10-CM

## 2015-01-18 DIAGNOSIS — M25569 Pain in unspecified knee: Secondary | ICD-10-CM | POA: Insufficient documentation

## 2015-01-18 DIAGNOSIS — S8991XA Unspecified injury of right lower leg, initial encounter: Secondary | ICD-10-CM | POA: Diagnosis not present

## 2015-01-18 MED ORDER — KETOROLAC TROMETHAMINE 60 MG/2ML IM SOLN
60.0000 mg | Freq: Once | INTRAMUSCULAR | Status: AC
  Administered 2015-01-18: 60 mg via INTRAMUSCULAR

## 2015-01-18 MED ORDER — DICLOFENAC POTASSIUM 50 MG PO TABS: 50.0000 mg | ORAL_TABLET | Freq: Two times a day (BID) | ORAL | Status: DC

## 2015-01-18 NOTE — Addendum Note (Signed)
Addended by: Eduard Roux E on: 01/18/2015 10:21 AM   Modules accepted: Orders

## 2015-01-18 NOTE — Telephone Encounter (Signed)
[name] name: Emmalea Treanor Relationship to patient: self Can be reached: [phone]  Reason for call: Pt called to see if she can change to Percell Miller being her primary provider. I advised pt that both providers would have to agree and she will be notified.

## 2015-01-18 NOTE — Progress Notes (Signed)
Pre visit review using our clinic review tool, if applicable. No additional management support is needed unless otherwise documented below in the visit note. 

## 2015-01-18 NOTE — Assessment & Plan Note (Addendum)
Xray of rt knee. Toradol 60 mg im. Diclofenac  short term rx. If pain perists consider referral to ortho sports med.  Make sure not taking any other nsaid rx or otc.

## 2015-01-18 NOTE — Progress Notes (Signed)
   Subjective:    Patient ID: Monica Copeland, female    DOB: 12/24/1940, 74 y.o.   MRN: [id number]  HPI  Pt fell 3 wks ago. She has rt knee pain in past. Hx of some cortisone injections in the past. 2 injections in past. Last one maybe 6 months ago.   At time she fell she landed on her rt knee.   Also had her arms outstretched to stop fall. Moderate pain left axillary area since then on movment. But has decreased great bit. Only residual now and on movement. No chest pain. Pt states this pain is now faint barely noticeable. Her concern is her knee.    Review of Systems  Constitutional: Negative for fever, chills and fatigue.  Respiratory: Negative for cough, chest tightness, shortness of breath and wheezing.   Cardiovascular: Negative for chest pain and palpitations.  Gastrointestinal: Negative for abdominal pain.  Musculoskeletal: Negative for back pain.       Rt knee pain.  Neurological: Negative for dizziness, tremors, seizures, syncope, speech difficulty, weakness, light-headedness, numbness and headaches.  Hematological: Negative for adenopathy. Does not bruise/bleed easily.       Objective:   Physical Exam  General- No acute distress. Pleasant patient. Neck- Full range of motion, no jvd Lungs- Clear, even and unlabored. Heart- regular rate and rhythm. Neurologic- CNII- XII grossly intact.  Rt knee-moderate swelling. Medial aspect tender to palpation. No instability. No crepitus. No calf swelling and pedal edema. Negative homans signs.  Lt axillary/pec area- on lifting arm she has pain is this area. At rest no pain.  Lt shoulder-  No pain on palpation. When I palpate axillary area mild pain. When she lifts arm mild pain in axillary and upper pectoralis area.      Assessment & Plan:

## 2015-01-18 NOTE — Patient Instructions (Addendum)
Knee pain Xray of rt knee. Toradol 60 mg im. Diclofenac  short term rx. If pain perists consider referral to ortho sports med.  Make sure not taking any other nsaid rx or otc.    Regarding your left pec/axillary pain. This occurred at time of falls as your outstretched your arms. Gradually improving and only on movment(consistent with muscle strain). If pain without movment or any chest pain then ED evaluation.   Follow up in 7 days or as needed. But by wed if not feeling better with knee could refer to specialist.

## 2015-01-18 NOTE — Telephone Encounter (Signed)
Patient called in stating that she would like to stay with Dr. Etter Sjogren

## 2015-01-27 ENCOUNTER — Other Ambulatory Visit: Payer: Self-pay | Admitting: Family Medicine

## 2015-01-27 ENCOUNTER — Telehealth: Payer: Self-pay | Admitting: Family Medicine

## 2015-01-27 DIAGNOSIS — M25569 Pain in unspecified knee: Secondary | ICD-10-CM

## 2015-01-27 NOTE — Telephone Encounter (Signed)
Relation to pt: self Call back number: [phone]  Reason for call:  Pt was last seen 01/18/2015 due to knee pain. Pt states symptoms have not improved. Please advise

## 2015-01-28 ENCOUNTER — Ambulatory Visit: Payer: Medicare Other | Admitting: Medical

## 2015-01-28 NOTE — Telephone Encounter (Signed)
Let pt know will refer her to sports medicine. Degenerative changes with calcific tendon.

## 2015-01-28 NOTE — Telephone Encounter (Signed)
Last seen 4/11/6 and filled 08/13/14 #60 with 2 refills. UDS 4/11/6 low risk  Please advise     KP

## 2015-01-28 NOTE — Addendum Note (Signed)
Addended by: Anabel Halon on: 01/28/2015 08:18 AM   Modules accepted: Orders

## 2015-01-28 NOTE — Telephone Encounter (Signed)
Pt should see sports medicine. Can they schedule her for tomorrow or Monday?

## 2015-01-29 ENCOUNTER — Encounter: Payer: Self-pay | Admitting: Family Medicine

## 2015-01-29 ENCOUNTER — Ambulatory Visit: Payer: Medicare Other | Admitting: Family Medicine

## 2015-02-02 NOTE — Telephone Encounter (Signed)
Yes can have doc of day appointment.

## 2015-02-03 NOTE — Telephone Encounter (Signed)
Called patient about trying to be seen for knee pain. Advised her to call office to schedule appointment when she knows she can get here. Tried to make appointment for patient on several occasions but patient can never get transportation.

## 2015-02-09 ENCOUNTER — Ambulatory Visit: Payer: Self-pay | Admitting: Family Medicine

## 2015-02-11 ENCOUNTER — Ambulatory Visit: Payer: Self-pay | Admitting: Family Medicine

## 2015-02-18 ENCOUNTER — Ambulatory Visit (INDEPENDENT_AMBULATORY_CARE_PROVIDER_SITE_OTHER): Payer: Medicare Other | Admitting: Family Medicine

## 2015-02-18 ENCOUNTER — Encounter: Payer: Self-pay | Admitting: Family Medicine

## 2015-02-18 VITALS — BP 108/68 | HR 79 | Temp 98.0°F | Ht <= 58 in | Wt 123.6 lb

## 2015-02-18 DIAGNOSIS — M25561 Pain in right knee: Secondary | ICD-10-CM

## 2015-02-18 NOTE — Progress Notes (Signed)
Patient ID: Monica Copeland, female    DOB: 05/31/1941  Age: 74 y.o. MRN: [id number]    Subjective:  Subjective HPI [name] presents for R knee pain.  No new injury.  See last ov.  She has seen ortho in past-- 2 years ago for injection and it has recently started bothering her again.    Review of Systems  Constitutional: Negative for activity change, appetite change, fatigue and unexpected weight change.  Respiratory: Negative for cough and shortness of breath.   Cardiovascular: Negative for chest pain and palpitations.  Musculoskeletal: Positive for arthralgias and gait problem.  Psychiatric/Behavioral: Negative for behavioral problems and dysphoric mood. The patient is not nervous/anxious.     History Past Medical History  Diagnosis Date  . Hypertension   . Hyperlipemia   . Asthma   . Diabetes mellitus without complication   . Asthma   . Back pain   . Seizures   . Anxiety   . Cushing disease   . GERD (gastroesophageal reflux disease) 01/24/2012  . Emphysema of lung   . Hyperlipidemia   . Ulcer   . Hemorrhoid     She has past surgical history that includes Brain surgery (1987); Colon surgery; Appendectomy; candida esophagitis; pylorus ulcer; and Abdominal hysterectomy.   Her family history includes Asthma in her father and son; Diabetes in her father; Esophageal cancer in her brother; Heart attack in her mother; Hypertension in her brother and mother; Liver cancer in her brother; Lung cancer in her father. There is no history of Colon cancer.She reports that she has been smoking Cigarettes.  She has been smoking about 0.75 packs per day. She has never used smokeless tobacco. She reports that she does not drink alcohol or use illicit drugs.  Current Outpatient Prescriptions on File Prior to Visit  Medication Sig Dispense Refill  . alendronate (FOSAMAX) 70 MG tablet Take 1 tablet (70 mg total) by mouth every 7 (seven) days. Take with a full glass of water on an  empty stomach. 4 tablet 11  . ALPRAZolam (XANAX) 0.5 MG tablet TAKE 1/2 TO 1 TABLET BY MOUTH TWICE DAILY AS NEEDED 60 tablet 0  . atorvastatin (LIPITOR) 40 MG tablet TAKE 1 TABLET BY MOUTH DAILY 30 tablet 2  . budesonide-formoterol (SYMBICORT) 80-4.5 MCG/ACT inhaler Inhale 2 puffs into the lungs as needed.    . cyclobenzaprine (FLEXERIL) 10 MG tablet 1/2 tab po tid prn 60 tablet 0  . diclofenac (CATAFLAM) 50 MG tablet Take 1 tablet (50 mg total) by mouth 2 (two) times daily. 14 tablet 0  . famotidine (PEPCID) 20 MG tablet Take 1 tablet (20 mg total) by mouth 2 (two) times daily. 60 tablet 0  . feeding supplement, ENSURE COMPLETE, (ENSURE COMPLETE) LIQD Take 237 mLs by mouth 2 (two) times daily between meals. 237 mL 0  . fenofibrate 160 MG tablet Take 1 tablet (160 mg total) by mouth daily. 30 tablet 5  . ferrous sulfate 325 (65 FE) MG tablet TAKE 1 TABLET BY MOUTH TWICE DAILY BEFORE LUNCH AND SUPPER 60 tablet 11  . FLUoxetine (PROZAC) 20 MG tablet Take 1 tablet (20 mg total) by mouth daily. 30 tablet 5  . hyoscyamine (LEVBID) 0.375 MG 12 hr tablet Take 1 tablet (0.375 mg total) by mouth 2 (two) times daily. 60 tablet 0  . levothyroxine (SYNTHROID, LEVOTHROID) 25 MCG tablet Take 1 tablet (25 mcg total) by mouth daily before breakfast. 30 tablet 0  . meloxicam (MOBIC) 7.5 MG tablet  Take 1 tablet (7.5 mg total) by mouth daily. 30 tablet 5  . potassium chloride SA (K-DUR,KLOR-CON) 20 MEQ tablet Take 1 tablet (20 mEq total) by mouth daily. 30 tablet 2  . pramipexole (MIRAPEX) 1 MG tablet TAKE 1 TABLET BY MOUTH EVERY NIGHT AT BEDTIME 60 tablet 1  . predniSONE (DELTASONE) 5 MG tablet TAKE 1 TABLET BY MOUTH ONCE DAILY. 30 tablet 11  . PROAIR HFA 108 (90 BASE) MCG/ACT inhaler Inhale 1-2 puffs into the lungs every 6 (six) hours as needed for wheezing or shortness of breath.     . Tdap (BOOSTRIX) 5-2.5-18.5 LF-MCG/0.5 injection Inject 0.5 mLs into the muscle once. 0.5 mL 0   No current facility-administered  medications on file prior to visit.     Objective:  Objective Physical Exam  Constitutional: She is oriented to person, place, and time. She appears well-developed and well-nourished.  HENT:  Head: Normocephalic and atraumatic.  Eyes: Conjunctivae and EOM are normal.  Neck: Normal range of motion. Neck supple. No JVD present. Carotid bruit is not present. No thyromegaly present.  Cardiovascular: Normal rate, regular rhythm and normal heart sounds.   No murmur heard. Pulmonary/Chest: Effort normal and breath sounds normal. No respiratory distress. She has no wheezes. She has no rales. She exhibits no tenderness.  Musculoskeletal: She exhibits edema and tenderness.       Right knee: She exhibits swelling. Tenderness found. Medial joint line tenderness noted.  Neurological: She is alert and oriented to person, place, and time.  Psychiatric: She has a normal mood and affect.   BP 108/68 mmHg  Pulse 79  Temp(Src) 98 F (36.7 C) (Oral)  Ht 4\' 10"  (1.473 m)  Wt 123 lb 9.6 oz (56.065 kg)  BMI 25.84 kg/m2  SpO2 98% Wt Readings from Last 3 Encounters:  02/18/15 123 lb 9.6 oz (56.065 kg)  01/18/15 121 lb 9.6 oz (55.157 kg)  12/07/14 117 lb 6.4 oz (53.252 kg)     Lab Results  Component Value Date   WBC 14.4* 12/07/2014   HGB 15.6* 12/07/2014   HCT 45.9 12/07/2014   PLT 218.0 12/07/2014   GLUCOSE 97 12/07/2014   CHOL 230* 12/07/2014   TRIG 161.0* 12/07/2014   HDL 53.10 12/07/2014   LDLDIRECT 128.6 05/28/2014   LDLCALC 145* 12/07/2014   ALT 18 12/07/2014   AST 20 12/07/2014   NA 138 12/07/2014   K 3.6 12/07/2014   CL 101 12/07/2014   CREATININE 1.01 12/07/2014   BUN 21 12/07/2014   CO2 26 12/07/2014   TSH 2.04 12/07/2014   TSH 1.340 12/07/2014   INR 1.02 04/24/2012   HGBA1C 6.3 10/28/2013   MICROALBUR 0.9 12/07/2014    Dg Knee Ap/lat W/sunrise Right  01/18/2015   CLINICAL DATA:  Fall 3 weeks ago.  Pain .  EXAM: RIGHT KNEE 3 VIEWS  COMPARISON:  None.  FINDINGS:  Patellofemoral and medial compartment degenerative change noted. Calcification noted in the region of the superior portion of the medial collateral ligament consistent with old injury. Subcutaneous soft tissue calcification noted.  IMPRESSION: 1. No acute abnormality. 2. Patellofemoral and medial compartment degenerative change. 3. Medial collateral ligamentous calcification consistent with old injury .   Electronically Signed   By: Marcello Moores  Register   On: 01/18/2015 10:31     Assessment & Plan:  Plan I am having Ms. Pritchard maintain her alendronate, PROAIR HFA, budesonide-formoterol, potassium chloride SA, predniSONE, ferrous sulfate, pramipexole, hyoscyamine, feeding supplement (ENSURE COMPLETE), levothyroxine, famotidine, meloxicam, cyclobenzaprine, FLUoxetine, atorvastatin,  Tdap, fenofibrate, diclofenac, and ALPRAZolam.  No orders of the defined types were placed in this encounter.    Problem List Items Addressed This Visit    None    Visit Diagnoses    Right knee pain    -  Primary    Relevant Orders    Ambulatory referral to Sports Medicine     ice, elevated, rest  Follow-up: No Follow-up on file.  Garnet Koyanagi, DO

## 2015-02-18 NOTE — Progress Notes (Signed)
Pre visit review using our clinic review tool, if applicable. No additional management support is needed unless otherwise documented below in the visit note. 

## 2015-02-18 NOTE — Patient Instructions (Signed)

## 2015-02-19 ENCOUNTER — Encounter: Payer: Self-pay | Admitting: Family Medicine

## 2015-02-19 ENCOUNTER — Ambulatory Visit (INDEPENDENT_AMBULATORY_CARE_PROVIDER_SITE_OTHER): Payer: Medicare Other | Admitting: Family Medicine

## 2015-02-19 VITALS — BP 138/76 | Ht 61.0 in | Wt 120.0 lb

## 2015-02-19 DIAGNOSIS — M25561 Pain in right knee: Secondary | ICD-10-CM | POA: Diagnosis not present

## 2015-02-19 MED ORDER — METHYLPREDNISOLONE ACETATE 40 MG/ML IJ SUSP
40.0000 mg | Freq: Once | INTRAMUSCULAR | Status: AC
  Administered 2015-02-19: 40 mg via INTRA_ARTICULAR

## 2015-02-19 NOTE — Patient Instructions (Signed)
Your knee pain is due to arthritis and a knee contusion. These are the 4 classes of medicine you can take for this: Tylenol 500mg  1-2 tabs three times a day for pain. Aleve 1-2 tabs twice a day with food Glucosamine sulfate 750mg  twice a day is a supplement that may help. Capsaicin, aspercreme, or biofreeze topically up to four times a day may also help with pain. Cortisone injections are an option - you were given one of these today. If cortisone injections do not help, there are different types of shots that may help but they take longer to take effect. It's important that you continue to stay active. Straight leg raises, knee extensions 3 sets of 10 once a day (add ankle weight if these become too easy). Consider physical therapy to strengthen muscles around the joint that hurts to take pressure off of the joint itself. Shoe inserts with good arch support may be helpful. Walker or cane if needed. Heat or ice 15 minutes at a time 3-4 times a day as needed to help with pain. Water aerobics and cycling with low resistance are the best two types of exercise for arthritis. Follow up with me in 1 month.

## 2015-02-22 NOTE — Assessment & Plan Note (Signed)
2/2 contusion and flare of underlying DJD.  Discussed tylenol, nsaids, glucosamine, topiocal medications.  Injection given today. Shown home exercises to do daily.  Consider PT if not improving.  F/u in 1 month.  After informed written consent, patient was seated on exam table. Right knee was prepped with alcohol swab and utilizing anteromedial approach, patient's right knee was injected intraarticularly with 3:1 marcaine: depomedrol. Patient tolerated the procedure well without immediate complications.

## 2015-02-22 NOTE — Progress Notes (Signed)
PCP: Garnet Koyanagi, DO  Subjective:   HPI: Patient is a 74 y.o. female here for right knee pain.  Patient reports she's had medial right knee pain for years. Intermittently has received injections which have helped her - last one at least 6 months ago. Recalls about 3 months ago she fell forward and worsened medial right knee pain. Not taking anything currently - had been taking occasional aleve. 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  Also has pellegrini-stieda lesion. No catching, locking, giving out.  Past Medical History  Diagnosis Date  . Hypertension   . Hyperlipemia   . Asthma   . Diabetes mellitus without complication   . Asthma   . Back pain   . Seizures   . Anxiety   . Cushing disease   . GERD (gastroesophageal reflux disease) 01/24/2012  . Emphysema of lung   . Hyperlipidemia   . Ulcer   . Hemorrhoid     Current Outpatient Prescriptions on File Prior to Visit  Medication Sig Dispense Refill  . alendronate (FOSAMAX) 70 MG tablet Take 1 tablet (70 mg total) by mouth every 7 (seven) days. Take with a full glass of water on an empty stomach. 4 tablet 11  . ALPRAZolam (XANAX) 0.5 MG tablet TAKE 1/2 TO 1 TABLET BY MOUTH TWICE DAILY AS NEEDED 60 tablet 0  . atorvastatin (LIPITOR) 40 MG tablet TAKE 1 TABLET BY MOUTH DAILY 30 tablet 2  . budesonide-formoterol (SYMBICORT) 80-4.5 MCG/ACT inhaler Inhale 2 puffs into the lungs as needed.    . cyclobenzaprine (FLEXERIL) 10 MG tablet 1/2 tab po tid prn 60 tablet 0  . diclofenac (CATAFLAM) 50 MG tablet Take 1 tablet (50 mg total) by mouth 2 (two) times daily. 14 tablet 0  . famotidine (PEPCID) 20 MG tablet Take 1 tablet (20 mg total) by mouth 2 (two) times daily. 60 tablet 0  . feeding supplement, ENSURE COMPLETE, (ENSURE COMPLETE) LIQD Take 237 mLs by mouth 2 (two) times daily between meals. 237 mL 0  . fenofibrate 160 MG tablet Take 1 tablet (160 mg total) by mouth daily. 30 tablet 5  .  ferrous sulfate 325 (65 FE) MG tablet TAKE 1 TABLET BY MOUTH TWICE DAILY BEFORE LUNCH AND SUPPER 60 tablet 11  . FLUoxetine (PROZAC) 20 MG tablet Take 1 tablet (20 mg total) by mouth daily. 30 tablet 5  . hyoscyamine (LEVBID) 0.375 MG 12 hr tablet Take 1 tablet (0.375 mg total) by mouth 2 (two) times daily. 60 tablet 0  . levothyroxine (SYNTHROID, LEVOTHROID) 25 MCG tablet Take 1 tablet (25 mcg total) by mouth daily before breakfast. 30 tablet 0  . meloxicam (MOBIC) 7.5 MG tablet Take 1 tablet (7.5 mg total) by mouth daily. 30 tablet 5  . potassium chloride SA (K-DUR,KLOR-CON) 20 MEQ tablet Take 1 tablet (20 mEq total) by mouth daily. 30 tablet 2  . pramipexole (MIRAPEX) 1 MG tablet TAKE 1 TABLET BY MOUTH EVERY NIGHT AT BEDTIME 60 tablet 1  . predniSONE (DELTASONE) 5 MG tablet TAKE 1 TABLET BY MOUTH ONCE DAILY. 30 tablet 11  . PROAIR HFA 108 (90 BASE) MCG/ACT inhaler Inhale 1-2 puffs into the lungs every 6 (six) hours as needed for wheezing or shortness of breath.     . Tdap (BOOSTRIX) 5-2.5-18.5 LF-MCG/0.5 injection Inject 0.5 mLs into the muscle once. 0.5 mL 0   No current facility-administered medications on file prior to visit.    Past Surgical History  Procedure Laterality Date  .  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 Colon surgery      colon perforation  . Appendectomy    . Candida esophagitis    . Pylorus ulcer    . [place]    Allergies  Allergen Reactions  . Tomato Rash    History   Social History  . Marital Status: Widowed    Spouse Name: N/A  . Number of Children: N/A  . Years of Education: N/A   Occupational History  . Not on file.   Social History Main Topics  . Smoking status: Current Every Day Smoker -- 0.75 packs/day    Types: Cigarettes  . Smokeless tobacco: Never Used     Comment: Started smoking at age 70.form given 04-02-13  . Alcohol Use: No  . Drug Use: No  . Sexual Activity: Not Currently   Other Topics Concern  .  Not on file   Social History Narrative   ** Merged History Encounter **        Family History  Problem Relation Age of Onset  . Hypertension Mother   . Hypertension Brother   . Heart attack Mother   . Diabetes Father   . Asthma Father   . Asthma Son   . Lung cancer Father   . Esophageal cancer Brother   . Liver cancer Brother     mets from esophagus  . Colon cancer Neg Hx     BP 138/76 mmHg  Ht 5\' 1"  (1.549 m)  Wt 120 lb (54.432 kg)  BMI 22.69 kg/m2  Review of Systems: See HPI above.    Objective:  Physical Exam:  Gen: NAD  Right knee: No gross deformity, ecchymoses, effusion. TTP medial joint line. FROM. Negative ant/post drawers. Negative valgus/varus testing. Negative lachmanns. Negative mcmurrays, apleys, patellar apprehension. NV intact distally.    Assessment & Plan:  1. Right knee pain - 2/2 contusion and flare of underlying DJD.  Discussed tylenol, nsaids, glucosamine, topiocal medications.  Injection given today. Shown home exercises to do daily.  Consider PT if not improving.  F/u in 1 month.  After informed written consent, patient was seated on exam table. Right knee was prepped with alcohol swab and utilizing anteromedial approach, patient's right knee was injected intraarticularly with 3:1 marcaine: depomedrol. Patient tolerated the procedure well without immediate complications.

## 2015-02-27 ENCOUNTER — Other Ambulatory Visit: Payer: Self-pay | Admitting: Family Medicine

## 2015-03-02 NOTE — Telephone Encounter (Signed)
Pt is requesting refill on Alprazolam. Dr. Etter Sjogren Pt.  Last OV: 02/18/2015  Last Fill: 01/28/2015 #60 0RF UDS: 12/07/2014 Low risk  Please advise.

## 2015-03-02 NOTE — Telephone Encounter (Signed)
Okay #60, no refills 

## 2015-03-02 NOTE — Telephone Encounter (Signed)
Rx faxed to [facility] pharmacy.  

## 2015-03-02 NOTE — Telephone Encounter (Signed)
Requesting:ALPRAZolam (XANAX) 0.5 MG tablet Contract not on file UDS 12/07/2014--low risk per Dr Etter Sjogren Last OV 02/18/2015 Last Refill #60 with 0 refills 01/28/2015  Please Advise

## 2015-03-22 ENCOUNTER — Encounter: Payer: Self-pay | Admitting: Family Medicine

## 2015-03-22 ENCOUNTER — Ambulatory Visit (INDEPENDENT_AMBULATORY_CARE_PROVIDER_SITE_OTHER): Payer: Medicare Other | Admitting: Family Medicine

## 2015-03-22 VITALS — BP 119/75 | HR 65 | Ht 60.0 in | Wt 115.0 lb

## 2015-03-22 DIAGNOSIS — M25561 Pain in right knee: Secondary | ICD-10-CM

## 2015-03-22 NOTE — Patient Instructions (Signed)
Your knee pain is due to arthritis and a knee contusion. These are the 4 classes of medicine you can take for this: Tylenol 500mg  1-2 tabs three times a day for pain. Aleve 1-2 tabs twice a day with food Glucosamine sulfate 750mg  twice a day is a supplement that may help. Capsaicin, aspercreme, or biofreeze topically up to four times a day may also help with pain. You can get cortisone shots every 3 months. If cortisone injections do not help, there are different types of shots that may help but they take longer to take effect- call me if you're struggling in the next month or so and want to try these. It's important that you continue to stay active. Straight leg raises, knee extensions 3 sets of 10 once a day (add ankle weight if these become too easy). Walker or cane if needed. Heat or ice 15 minutes at a time 3-4 times a day as needed to help with pain. Follow up with me as needed.

## 2015-03-23 NOTE — Assessment & Plan Note (Signed)
2/2 contusion and flare of underlying DJD.  Feels improved symptomatically following injection.  Again discussed tylenol, nsaids, glucosamine, topiocal medications.  Discussed cortisone injections can be done every 3 months, viscosupplementation is an option if she worsens before that time.  F/u prn.

## 2015-03-23 NOTE — Progress Notes (Signed)
PCP: Garnet Koyanagi, DO  Subjective:   HPI: Patient is a 74 y.o. female here for right knee pain.  6/24: Patient reports she's had medial right knee pain for years. Intermittently has received injections which have helped her - last one at least 6 months ago. Recalls about 3 months ago she fell forward and worsened medial right knee pain. Not taking anything currently - had been taking occasional aleve. 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  Also has pellegrini-stieda lesion. No catching, locking, giving out.  7/25: Patient reports she continues to have right worse than left pain but does feel improved after right cortisone injection. No swelling. Not taking anything for pain. Pain level 5/10 at the worst in right. Currently no pain in left knee. Difficulty extending the knee at times. No catching, locking, giving out.  Past Medical History  Diagnosis Date  . Hypertension   . Hyperlipemia   . Asthma   . Diabetes mellitus without complication   . Asthma   . Back pain   . Seizures   . Anxiety   . Cushing disease   . GERD (gastroesophageal reflux disease) 01/24/2012  . Emphysema of lung   . Hyperlipidemia   . Ulcer   . Hemorrhoid     Current Outpatient Prescriptions on File Prior to Visit  Medication Sig Dispense Refill  . alendronate (FOSAMAX) 70 MG tablet Take 1 tablet (70 mg total) by mouth every 7 (seven) days. Take with a full glass of water on an empty stomach. 4 tablet 11  . ALPRAZolam (XANAX) 0.5 MG tablet Take 0.5-1 tablets (0.25-0.5 mg total) by mouth 2 (two) times daily as needed. 60 tablet 0  . atorvastatin (LIPITOR) 40 MG tablet TAKE 1 TABLET BY MOUTH DAILY 30 tablet 2  . budesonide-formoterol (SYMBICORT) 80-4.5 MCG/ACT inhaler Inhale 2 puffs into the lungs as needed.    . cyclobenzaprine (FLEXERIL) 10 MG tablet 1/2 tab po tid prn 60 tablet 0  . diclofenac (CATAFLAM) 50 MG tablet Take 1 tablet (50 mg total) by mouth 2  (two) times daily. 14 tablet 0  . famotidine (PEPCID) 20 MG tablet Take 1 tablet (20 mg total) by mouth 2 (two) times daily. 60 tablet 0  . feeding supplement, ENSURE COMPLETE, (ENSURE COMPLETE) LIQD Take 237 mLs by mouth 2 (two) times daily between meals. 237 mL 0  . fenofibrate 160 MG tablet Take 1 tablet (160 mg total) by mouth daily. 30 tablet 5  . ferrous sulfate 325 (65 FE) MG tablet TAKE 1 TABLET BY MOUTH TWICE DAILY BEFORE LUNCH AND SUPPER 60 tablet 11  . FLUoxetine (PROZAC) 20 MG tablet Take 1 tablet (20 mg total) by mouth daily. 30 tablet 5  . hyoscyamine (LEVBID) 0.375 MG 12 hr tablet Take 1 tablet (0.375 mg total) by mouth 2 (two) times daily. 60 tablet 0  . levothyroxine (SYNTHROID, LEVOTHROID) 25 MCG tablet Take 1 tablet (25 mcg total) by mouth daily before breakfast. 30 tablet 0  . meloxicam (MOBIC) 7.5 MG tablet Take 1 tablet (7.5 mg total) by mouth daily. 30 tablet 5  . potassium chloride SA (K-DUR,KLOR-CON) 20 MEQ tablet Take 1 tablet (20 mEq total) by mouth daily. 30 tablet 2  . pramipexole (MIRAPEX) 1 MG tablet TAKE 1 TABLET BY MOUTH EVERY NIGHT AT BEDTIME 60 tablet 1  . predniSONE (DELTASONE) 5 MG tablet TAKE 1 TABLET BY MOUTH ONCE DAILY. 30 tablet 11  . PROAIR HFA 108 (90 BASE) MCG/ACT inhaler Inhale 1-2 puffs  into the lungs every 6 (six) hours as needed for wheezing or shortness of breath.     . Tdap (BOOSTRIX) 5-2.5-18.5 LF-MCG/0.5 injection Inject 0.5 mLs into the muscle once. 0.5 mL 0   No current facility-administered medications on file prior to visit.    Past Surgical History  Procedure Laterality Date  . 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 Colon surgery      colon perforation  . Appendectomy    . Candida esophagitis    . Pylorus ulcer    . [place]    Allergies  Allergen Reactions  . Tomato Rash    History   Social History  . Marital Status: Widowed    Spouse Name: N/A  . Number of Children: N/A  . Years of  Education: N/A   Occupational History  . Not on file.   Social History Main Topics  . Smoking status: Current Every Day Smoker -- 0.75 packs/day    Types: Cigarettes  . Smokeless tobacco: Never Used     Comment: Started smoking at age 4.form given 04-02-13  . Alcohol Use: No  . Drug Use: No  . Sexual Activity: Not Currently   Other Topics Concern  . Not on file   Social History Narrative   ** Merged History Encounter **        Family History  Problem Relation Age of Onset  . Hypertension Mother   . Hypertension Brother   . Heart attack Mother   . Diabetes Father   . Asthma Father   . Asthma Son   . Lung cancer Father   . Esophageal cancer Brother   . Liver cancer Brother     mets from esophagus  . Colon cancer Neg Hx     BP 119/75 mmHg  Pulse 65  Ht 5' (1.524 m)  Wt 115 lb (52.164 kg)  BMI 22.46 kg/m2  Review of Systems: See HPI above.    Objective:  Physical Exam:  Gen: NAD  Right knee: No gross deformity, ecchymoses, effusion. TTP medial > lateral joint line. FROM. Negative ant/post drawers. Negative valgus/varus testing. Negative lachmanns. Negative mcmurrays, apleys, patellar apprehension. NV intact distally.    Assessment & Plan:  1. Right knee pain - 2/2 contusion and flare of underlying DJD.  Feels improved symptomatically following injection.  Again discussed tylenol, nsaids, glucosamine, topiocal medications.  Discussed cortisone injections can be done every 3 months, viscosupplementation is an option if she worsens before that time.  F/u prn.

## 2015-03-30 ENCOUNTER — Ambulatory Visit: Payer: Medicare Other | Admitting: Family Medicine

## 2015-03-30 ENCOUNTER — Telehealth: Payer: Self-pay | Admitting: Family Medicine

## 2015-04-01 NOTE — Telephone Encounter (Signed)
Pt was no show 03/30/15 2:00pm, follow up appt 6 mo, called and rescheduled pt for 04/19/15, pt states she didn't know she had an appointment, charge for no show?

## 2015-04-01 NOTE — Telephone Encounter (Signed)
no

## 2015-04-02 ENCOUNTER — Other Ambulatory Visit: Payer: Self-pay | Admitting: Internal Medicine

## 2015-04-04 ENCOUNTER — Other Ambulatory Visit: Payer: Self-pay | Admitting: Family Medicine

## 2015-04-13 ENCOUNTER — Telehealth: Payer: Self-pay | Admitting: Family Medicine

## 2015-04-13 NOTE — Telephone Encounter (Signed)
[name] name:Risa Goldsmith Relationship to patient: Self  Can be reached: [phone] Pharmacy:  Reason for call: pt says that her landlord is giving her a hard time about keeping her wheelchair in the inside of her home. Pt says that she need Dr. Etter Sjogren to provide her with a note showing the importance of her chair.    Landlord # if needed. [phone]

## 2015-04-13 NOTE — Telephone Encounter (Signed)
Please advise      KP 

## 2015-04-13 NOTE — Telephone Encounter (Signed)
Ok to give letter

## 2015-04-14 NOTE — Telephone Encounter (Signed)
Patient aware the letter will be ready tomorrow.     KP

## 2015-04-19 ENCOUNTER — Ambulatory Visit (INDEPENDENT_AMBULATORY_CARE_PROVIDER_SITE_OTHER): Payer: Medicare Other | Admitting: Family Medicine

## 2015-04-19 ENCOUNTER — Encounter: Payer: Self-pay | Admitting: Family Medicine

## 2015-04-19 VITALS — BP 116/72 | HR 83 | Temp 98.4°F | Ht 60.0 in | Wt 122.2 lb

## 2015-04-19 DIAGNOSIS — M5440 Lumbago with sciatica, unspecified side: Secondary | ICD-10-CM | POA: Diagnosis not present

## 2015-04-19 DIAGNOSIS — R29818 Other symptoms and signs involving the nervous system: Secondary | ICD-10-CM

## 2015-04-19 DIAGNOSIS — E785 Hyperlipidemia, unspecified: Secondary | ICD-10-CM

## 2015-04-19 DIAGNOSIS — R2689 Other abnormalities of gait and mobility: Secondary | ICD-10-CM | POA: Insufficient documentation

## 2015-04-19 NOTE — Patient Instructions (Signed)

## 2015-04-19 NOTE — Progress Notes (Signed)
Patient ID: Monica Copeland, female   DOB: [birth year], 74 y.o.   MRN: [id number]   Subjective:    Patient ID: Monica Copeland, female    DOB: May 26, 1941, 74 y.o.   MRN: [id number]  Chief Complaint  Patient presents with  . Hyperlipidemia    6 mo follow up-- Fasting for labs    HPI Patient is in today for f/u cholesterol..  No complaints.    Past Medical History  Diagnosis Date  . Hypertension   . Hyperlipemia   . Asthma   . Diabetes mellitus without complication   . Asthma   . Back pain   . Seizures   . Anxiety   . Cushing disease   . GERD (gastroesophageal reflux disease) 01/24/2012  . Emphysema of lung   . Hyperlipidemia   . Ulcer   . Hemorrhoid     Past Surgical History  Procedure Laterality Date  . 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 Colon surgery      colon perforation  . Appendectomy    . Candida esophagitis    . Pylorus ulcer    . [place]      [place]    Family History  Problem Relation Age of Onset  . Hypertension Mother   . Hypertension Brother   . Heart attack Mother   . Diabetes Father   . Asthma Father   . Asthma Son   . Lung cancer Father   . Esophageal cancer Brother   . Liver cancer Brother     mets from esophagus  . Colon cancer Neg Hx     Social History   Social History  . Marital Status: Widowed    Spouse Name: N/A  . Number of Children: N/A  . Years of Education: N/A   Occupational History  . Not on file.   Social History Main Topics  . Smoking status: Current Every Day Smoker -- 0.75 packs/day    Types: Cigarettes  . Smokeless tobacco: Never Used     Comment: Started smoking at age 33.form given 04-02-13  . Alcohol Use: No  . Drug Use: No  . Sexual Activity: Not Currently   Other Topics Concern  . Not on file   Social History Narrative   ** Merged History Encounter **        Outpatient Prescriptions Prior to Visit  Medication Sig Dispense Refill  . alendronate (FOSAMAX) 70 MG tablet Take  1 tablet (70 mg total) by mouth every 7 (seven) days. Take with a full glass of water on an empty stomach. 4 tablet 11  . ALPRAZolam (XANAX) 0.5 MG tablet TAKE 1/2 TO 1 TABLET BY MOUTH TWICE DAILY AS NEEDED 60 tablet 0  . atorvastatin (LIPITOR) 40 MG tablet TAKE 1 TABLET BY MOUTH DAILY 30 tablet 2  . budesonide-formoterol (SYMBICORT) 80-4.5 MCG/ACT inhaler Inhale 2 puffs into the lungs as needed.    . cyclobenzaprine (FLEXERIL) 10 MG tablet 1/2 tab po tid prn 60 tablet 0  . diclofenac (CATAFLAM) 50 MG tablet Take 1 tablet (50 mg total) by mouth 2 (two) times daily. 14 tablet 0  . famotidine (PEPCID) 20 MG tablet Take 1 tablet (20 mg total) by mouth 2 (two) times daily. 60 tablet 0  . feeding supplement, ENSURE COMPLETE, (ENSURE COMPLETE) LIQD Take 237 mLs by mouth 2 (two) times daily between meals. 237 mL 0  . fenofibrate 160 MG tablet Take 1 tablet (160 mg total) by mouth  daily. 30 tablet 5  . ferrous sulfate 325 (65 FE) MG tablet TAKE 1 TABLET BY MOUTH TWICE DAILY BEFORE LUNCH AND SUPPER 60 tablet 11  . FLUoxetine (PROZAC) 20 MG capsule TAKE 1 CAPSULE BY MOUTH DAILY 30 capsule 11  . hyoscyamine (LEVBID) 0.375 MG 12 hr tablet Take 1 tablet (0.375 mg total) by mouth 2 (two) times daily. 60 tablet 0  . levothyroxine (SYNTHROID, LEVOTHROID) 25 MCG tablet Take 1 tablet (25 mcg total) by mouth daily before breakfast. 30 tablet 0  . meloxicam (MOBIC) 7.5 MG tablet Take 1 tablet (7.5 mg total) by mouth daily. 30 tablet 5  . potassium chloride SA (K-DUR,KLOR-CON) 20 MEQ tablet Take 1 tablet (20 mEq total) by mouth daily. 30 tablet 2  . pramipexole (MIRAPEX) 1 MG tablet TAKE 1 TABLET BY MOUTH EVERY NIGHT AT BEDTIME 60 tablet 1  . predniSONE (DELTASONE) 5 MG tablet TAKE 1 TABLET BY MOUTH ONCE DAILY. 30 tablet 11  . PROAIR HFA 108 (90 BASE) MCG/ACT inhaler Inhale 1-2 puffs into the lungs every 6 (six) hours as needed for wheezing or shortness of breath.     . Tdap (BOOSTRIX) 5-2.5-18.5 LF-MCG/0.5 injection  Inject 0.5 mLs into the muscle once. 0.5 mL 0   No facility-administered medications prior to visit.    Allergies  Allergen Reactions  . Tomato Rash    Review of Systems  Constitutional: Negative for fever and malaise/fatigue.  HENT: Negative for congestion.   Eyes: Negative for discharge.  Respiratory: Negative for shortness of breath.   Cardiovascular: Negative for chest pain, palpitations and leg swelling.  Gastrointestinal: Negative for nausea and abdominal pain.  Genitourinary: Negative for dysuria.  Musculoskeletal: Negative for falls.  Skin: Negative for rash.  Neurological: Negative for loss of consciousness and headaches.  Endo/Heme/Allergies: Negative for environmental allergies.  Psychiatric/Behavioral: Negative for depression. The patient is not nervous/anxious.        Objective:    Physical Exam  Constitutional: She is oriented to person, place, and time. She appears well-developed and well-nourished.  HENT:  Head: Normocephalic and atraumatic.  Eyes: Conjunctivae and EOM are normal.  Neck: Normal range of motion. Neck supple. No JVD present. Carotid bruit is not present. No thyromegaly present.  Cardiovascular: Normal rate, regular rhythm and normal heart sounds.   No murmur heard. Pulmonary/Chest: Effort normal and breath sounds normal. No respiratory distress. She has no wheezes. She has no rales. She exhibits no tenderness.  Musculoskeletal: She exhibits no edema.  Neurological: She is alert and oriented to person, place, and time.  Psychiatric: She has a normal mood and affect. Her behavior is normal.    BP 116/72 mmHg  Pulse 83  Temp(Src) 98.4 F (36.9 C) (Oral)  Ht 5' (1.524 m)  Wt 122 lb 3.2 oz (55.43 kg)  BMI 23.87 kg/m2  SpO2 97% Wt Readings from Last 3 Encounters:  04/19/15 122 lb 3.2 oz (55.43 kg)  03/22/15 115 lb (52.164 kg)  02/19/15 120 lb (54.432 kg)     Lab Results  Component Value Date   WBC 14.4* 12/07/2014   HGB 15.6*  12/07/2014   HCT 45.9 12/07/2014   PLT 218.0 12/07/2014   GLUCOSE 88 04/19/2015   CHOL 280* 04/19/2015   TRIG 117.0 04/19/2015   HDL 62.30 04/19/2015   LDLDIRECT 128.6 05/28/2014   LDLCALC 195* 04/19/2015   ALT 17 04/19/2015   AST 28 04/19/2015   NA 137 04/19/2015   K 4.3 04/19/2015   CL 100 04/19/2015  CREATININE 1.07 04/19/2015   BUN 22 04/19/2015   CO2 28 04/19/2015   TSH 2.04 12/07/2014   TSH 1.340 12/07/2014   INR 1.02 04/24/2012   HGBA1C 6.3 10/28/2013   MICROALBUR 0.9 12/07/2014    Lab Results  Component Value Date   TSH 2.04 12/07/2014   TSH 1.340 12/07/2014   Lab Results  Component Value Date   WBC 14.4* 12/07/2014   HGB 15.6* 12/07/2014   HCT 45.9 12/07/2014   MCV 88.0 12/07/2014   PLT 218.0 12/07/2014   Lab Results  Component Value Date   NA 137 04/19/2015   K 4.3 04/19/2015   CO2 28 04/19/2015   GLUCOSE 88 04/19/2015   BUN 22 04/19/2015   CREATININE 1.07 04/19/2015   BILITOT 0.6 04/19/2015   ALKPHOS 43 04/19/2015   AST 28 04/19/2015   ALT 17 04/19/2015   PROT 7.2 04/19/2015   ALBUMIN 4.1 04/19/2015   CALCIUM 9.4 04/19/2015   ANIONGAP 14 08/05/2014   GFR 53.18* 04/19/2015   Lab Results  Component Value Date   CHOL 280* 04/19/2015   Lab Results  Component Value Date   HDL 62.30 04/19/2015   Lab Results  Component Value Date   LDLCALC 195* 04/19/2015   Lab Results  Component Value Date   TRIG 117.0 04/19/2015   Lab Results  Component Value Date   CHOLHDL 5 04/19/2015   Lab Results  Component Value Date   HGBA1C 6.3 10/28/2013       Assessment & Plan:   Problem List Items Addressed This Visit    Hyperlipidemia - Primary    Check labs Lab Results  Component Value Date   CHOL 280* 04/19/2015   HDL 62.30 04/19/2015   LDLCALC 195* 04/19/2015   LDLDIRECT 128.6 05/28/2014   TRIG 117.0 04/19/2015   CHOLHDL 5 04/19/2015   con't lipitor      Relevant Orders   Lipid panel (Completed)   Comprehensive metabolic panel  (Completed)   Balance problem    Pt uses a power wheelchair in and out of the house but mostly outside house       Other Visit Diagnoses    Low back pain with sciatica, sciatica laterality unspecified, unspecified back pain laterality           I have discontinued Ms. Roller's Tdap. I am also having her maintain her alendronate, PROAIR HFA, budesonide-formoterol, potassium chloride SA, predniSONE, ferrous sulfate, pramipexole, hyoscyamine, feeding supplement (ENSURE COMPLETE), levothyroxine, famotidine, meloxicam, cyclobenzaprine, atorvastatin, fenofibrate, diclofenac, ALPRAZolam, and FLUoxetine.  No orders of the defined types were placed in this encounter.     Garnet Koyanagi, DO

## 2015-04-19 NOTE — Progress Notes (Signed)
Pre visit review using our clinic review tool, if applicable. No additional management support is needed unless otherwise documented below in the visit note. 

## 2015-04-20 LAB — LIPID PANEL
Cholesterol: 280 mg/dL — ABNORMAL HIGH (ref 0–200)
HDL: 62.3 mg/dL (ref >39.00)
LDL CALC: 195 mg/dL — AB (ref 0–99)
NONHDL: 218.15
TRIGLYCERIDES: 117 mg/dL (ref 0.0–149.0)
Total CHOL/HDL Ratio: 5
VLDL: 23.4 mg/dL (ref 0.0–40.0)

## 2015-04-20 LAB — COMPREHENSIVE METABOLIC PANEL
ALT: 17 U/L (ref 0–35)
AST: 28 U/L (ref 0–37)
Albumin: 4.1 g/dL (ref 3.5–5.2)
Alkaline Phosphatase: 43 U/L (ref 39–117)
BILIRUBIN TOTAL: 0.6 mg/dL (ref 0.2–1.2)
BUN: 22 mg/dL (ref 6–23)
CALCIUM: 9.4 mg/dL (ref 8.4–10.5)
CO2: 28 mEq/L (ref 19–32)
Chloride: 100 mEq/L (ref 96–112)
Creatinine, Ser: 1.07 mg/dL (ref 0.40–1.20)
GFR: 53.18 mL/min — AB (ref >60.00)
Glucose, Bld: 88 mg/dL (ref 70–99)
Potassium: 4.3 mEq/L (ref 3.5–5.1)
Sodium: 137 mEq/L (ref 135–145)
Total Protein: 7.2 g/dL (ref 6.0–8.3)

## 2015-04-20 NOTE — Assessment & Plan Note (Signed)
Pt uses a power wheelchair in and out of the house but mostly outside house

## 2015-04-20 NOTE — Assessment & Plan Note (Signed)
Check labs Lab Results  Component Value Date   CHOL 280* 04/19/2015   HDL 62.30 04/19/2015   LDLCALC 195* 04/19/2015   LDLDIRECT 128.6 05/28/2014   TRIG 117.0 04/19/2015   CHOLHDL 5 04/19/2015   con't lipitor

## 2015-04-23 ENCOUNTER — Telehealth: Payer: Self-pay

## 2015-04-23 MED ORDER — PREDNISONE 5 MG PO TABS: ORAL_TABLET | ORAL | Status: DC

## 2015-04-23 NOTE — Telephone Encounter (Signed)
Please refill x 1 Ov is due  

## 2015-04-23 NOTE — Telephone Encounter (Signed)
Pt is requesting a refill on her prednisone 5 mg. Last office visit was 05/05/2014. Please advise if ok to refill. Thanks!

## 2015-04-23 NOTE — Telephone Encounter (Signed)
Pt contacted and scheduled for office visit on 05/07/2015.

## 2015-04-24 ENCOUNTER — Other Ambulatory Visit: Payer: Self-pay | Admitting: Endocrinology

## 2015-04-28 ENCOUNTER — Telehealth: Payer: Self-pay | Admitting: Family Medicine

## 2015-04-28 NOTE — Telephone Encounter (Signed)
Pt is requesting a call back from you in regards to her cholesterol medication.    CB#: [phone]

## 2015-04-29 NOTE — Telephone Encounter (Signed)
Spoke with patient and she stated she did not have any questions, she said she did not call here.    KP

## 2015-04-30 ENCOUNTER — Other Ambulatory Visit: Payer: Self-pay

## 2015-04-30 MED ORDER — EZETIMIBE 10 MG PO TABS: 10.0000 mg | ORAL_TABLET | Freq: Every day | ORAL | Status: DC

## 2015-05-04 ENCOUNTER — Telehealth: Payer: Self-pay | Admitting: Family Medicine

## 2015-05-04 ENCOUNTER — Other Ambulatory Visit: Payer: Self-pay | Admitting: Family Medicine

## 2015-05-04 NOTE — Telephone Encounter (Signed)
Last seen 04/19/15 and filled 04/05/15 #60 UDS 12/07/14 low risk   Please advise    KP

## 2015-05-04 NOTE — Telephone Encounter (Signed)
Relation to HY:HOOI Call back [facility]: [facility] [address] - [place], [place] [phone]) [phone] (Fax)         Reason for call: Patient does not know the name of her cholesterol medication requesting a refill and requesting a refill ALPRAZolam (XANAX) 0.5 MG tablet

## 2015-05-05 MED ORDER — ATORVASTATIN CALCIUM 40 MG PO TABS: 40.0000 mg | ORAL_TABLET | Freq: Every day | ORAL | Status: DC

## 2015-05-05 NOTE — Telephone Encounter (Signed)
Rx faxed.    KP 

## 2015-05-07 ENCOUNTER — Ambulatory Visit (INDEPENDENT_AMBULATORY_CARE_PROVIDER_SITE_OTHER): Payer: Medicare Other | Admitting: Endocrinology

## 2015-05-07 VITALS — BP 126/72 | HR 71 | Temp 98.2°F | Ht 60.0 in | Wt 123.0 lb

## 2015-05-07 DIAGNOSIS — Z23 Encounter for immunization: Secondary | ICD-10-CM | POA: Diagnosis not present

## 2015-05-07 NOTE — Progress Notes (Signed)
Subjective:    Patient ID: Monica Copeland, female    DOB: Jul 21, 1941, 74 y.o.   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  Last MRI of the brain was in 2013, and made no mention of the pituitary).  denies headache.   Past Medical History  Diagnosis Date  . Hypertension   . Hyperlipemia   . Asthma   . Diabetes mellitus without complication   . Asthma   . Back pain   . Seizures   . Anxiety   . Cushing disease   . GERD (gastroesophageal reflux disease) 01/24/2012  . Emphysema of lung   . Hyperlipidemia   . Ulcer   . Hemorrhoid     Past Surgical History  Procedure Laterality Date  . 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 Colon surgery      colon perforation  . Appendectomy    . Candida esophagitis    . Pylorus ulcer    . [place]      [place]    Social History   Social History  . Marital Status: Widowed    Spouse Name: N/A  . Number of Children: N/A  . Years of Education: N/A   Occupational History  . Not on file.   Social History Main Topics  . Smoking status: Current Every Day Smoker -- 0.75 packs/day    Types: Cigarettes  . Smokeless tobacco: Never Used     Comment: Started smoking at age 72.form given 04-02-13  . Alcohol Use: No  . Drug Use: No  . Sexual Activity: Not Currently   Other Topics Concern  . Not on file   Social History Narrative   ** Merged History Encounter **        Current Outpatient Prescriptions on File Prior to Visit  Medication Sig Dispense Refill  . alendronate (FOSAMAX) 70 MG tablet Take 1 tablet (70 mg total) by mouth every 7 (seven) days. Take with a full glass of water on an empty stomach. 4 tablet  11  . ALPRAZolam (XANAX) 0.5 MG tablet TAKE 1/2 TO 1 TABLET BY MOUTH TWICE DAILY AS NEEDED 60 tablet 0  . atorvastatin (LIPITOR) 40 MG tablet Take 1 tablet (40 mg total) by mouth daily. 30 tablet 2  . budesonide-formoterol (SYMBICORT) 80-4.5 MCG/ACT inhaler Inhale 2 puffs into the lungs as needed.    . cyclobenzaprine (FLEXERIL) 10 MG tablet 1/2 tab po tid prn 60 tablet 0  . diclofenac (CATAFLAM) 50 MG tablet Take 1 tablet (50 mg total) by mouth 2 (two) times daily. 14 tablet 0  . ezetimibe (ZETIA) 10 MG tablet Take 1 tablet (10 mg total) by mouth daily. 30 tablet 2  . famotidine (PEPCID) 20 MG tablet Take 1 tablet (20 mg total) by mouth 2 (two) times daily. 60 tablet 0  . feeding supplement, ENSURE COMPLETE, (ENSURE COMPLETE) LIQD Take 237 mLs by mouth 2 (two) times daily between meals. 237 mL 0  . fenofibrate 160 MG tablet Take 1 tablet (160 mg total) by mouth daily. 30 tablet 5  . ferrous sulfate 325 (65 FE) MG tablet TAKE 1 TABLET BY MOUTH TWICE DAILY BEFORE LUNCH AND  SUPPER 60 tablet 11  . FLUoxetine (PROZAC) 20 MG capsule TAKE 1 CAPSULE BY MOUTH DAILY 30 capsule 11  . hyoscyamine (LEVBID) 0.375 MG 12 hr tablet Take 1 tablet (0.375 mg total) by mouth 2 (two) times daily. 60 tablet 0  . levothyroxine (SYNTHROID, LEVOTHROID) 25 MCG tablet Take 1 tablet (25 mcg total) by mouth daily before breakfast. 30 tablet 0  . meloxicam (MOBIC) 7.5 MG tablet Take 1 tablet (7.5 mg total) by mouth daily. 30 tablet 5  . potassium chloride SA (K-DUR,KLOR-CON) 20 MEQ tablet Take 1 tablet (20 mEq total) by mouth daily. 30 tablet 2  . pramipexole (MIRAPEX) 1 MG tablet TAKE 1 TABLET BY MOUTH EVERY NIGHT AT BEDTIME 60 tablet 1  . predniSONE (DELTASONE) 5 MG tablet TAKE 1 TABLET BY MOUTH ONCE DAILY. 30 tablet 0  . PROAIR HFA 108 (90 BASE) MCG/ACT inhaler Inhale 1-2 puffs into the lungs every 6 (six) hours as needed for wheezing or shortness of breath.      No current facility-administered medications on file prior  to visit.    Allergies  Allergen Reactions  . Tomato Rash    Family History  Problem Relation Age of Onset  . Hypertension Mother   . Hypertension Brother   . Heart attack Mother   . Diabetes Father   . Asthma Father   . Asthma Son   . Lung cancer Father   . Esophageal cancer Brother   . Liver cancer Brother     mets from esophagus  . Colon cancer Neg Hx     BP 126/72 mmHg  Pulse 71  Temp(Src) 98.2 F (36.8 C) (Oral)  Ht 5' (1.524 m)  Wt 123 lb (55.792 kg)  BMI 24.02 kg/m2  SpO2 94%  Review of Systems Denies visual loss and falls.      Objective:   Physical Exam VITAL SIGNS:  See vs page GENERAL: no distress Gait: normal and steady   Lab Results  Component Value Date   TSH 2.04 12/07/2014   TSH 1.340 12/07/2014   T4TOTAL 4.6 12/07/2014      Assessment & Plan:  Postop pituitary insufficiency, well-replaced.  Patient is advised the following: Patient Instructions  Please continue the same medications.   Please continue to get the TSH and free T4 blood tests each year, as Dr Etter Sjogren is doing. I would be happy to see you back here as necessary.

## 2015-05-07 NOTE — Patient Instructions (Addendum)
Please continue the same medications.   Please continue to get the TSH and free T4 blood tests each year, as Dr Etter Sjogren is doing. I would be happy to see you back here as necessary.

## 2015-05-08 ENCOUNTER — Other Ambulatory Visit: Payer: Self-pay | Admitting: Family Medicine

## 2015-05-10 ENCOUNTER — Telehealth: Payer: Self-pay | Admitting: Endocrinology

## 2015-05-10 ENCOUNTER — Telehealth: Payer: Self-pay | Admitting: Family Medicine

## 2015-05-10 NOTE — Telephone Encounter (Signed)
[name] name:Cynthia Relation to UV:[id number] Call back [phone] Pharmacy:[facility]  Reason for call: pt's daughter states that [facility] stated that they do not have the rx for pt's xanax that you sent on 05/04/15, daughter would like for you to send to [facility] on [address] states they have been having trouble getting her moms rx through [facility]

## 2015-05-10 NOTE — Telephone Encounter (Signed)
Rx was faxed on 05/04/15.     KP

## 2015-05-10 NOTE — Telephone Encounter (Signed)
Please call pt back concerning medaications

## 2015-05-11 NOTE — Telephone Encounter (Signed)
Pt unavailable. Will try at a later time.

## 2015-05-11 NOTE — Telephone Encounter (Signed)
[name] name:Mayfield,Cynthia Relation to pt: daughter  Call back number: [phone] Pharmacy: [facility] [address] - [place], [place] [phone]) [phone] (Fax)         Reason for call:  Mother checking on status of ALPRAZolam (XANAX) 0.5 MG tablet

## 2015-05-12 NOTE — Telephone Encounter (Signed)
Attempted to reach the pt. Will try again at a later time.  

## 2015-05-13 ENCOUNTER — Telehealth: Payer: Self-pay | Admitting: *Deleted

## 2015-05-13 NOTE — Telephone Encounter (Signed)
PA for fluoxetine 20 mg tablet initiated. Awaiting determination. JG//CMA

## 2015-05-13 NOTE — Telephone Encounter (Signed)
DO NOT USE [facility]. PLEASE SEND ALL MED ORDERS TO [facility] ON [address].  Pt has been without xanax since last 05/05/15. Daughter is very upset she is going back and forth. Mostly upset with the pharmacy. Please send to [facility] [address].

## 2015-05-13 NOTE — Telephone Encounter (Signed)
Rx phone into the pharmacy. I have tried to call Caren Griffins. Message left to call.      KP

## 2015-05-13 NOTE — Telephone Encounter (Signed)
Pharmacy has been updated, and VM has been left for Caren Griffins to pick up at [facility] or call the pharmacy and have it transferred.      KP

## 2015-05-14 NOTE — Telephone Encounter (Signed)
Left voicemail advising the pt's Daughter to have the pt call our office.

## 2015-05-18 ENCOUNTER — Encounter: Payer: Self-pay | Admitting: Family Medicine

## 2015-05-18 NOTE — Telephone Encounter (Signed)
PA resubmitted with dx of anxiety and depression. Awaiting determination. JG//CMA

## 2015-05-18 NOTE — Telephone Encounter (Signed)
I used anxiety as the diagnosis.

## 2015-05-18 NOTE — Telephone Encounter (Signed)
PA denied for fluoxetine because the use is not supported by the FDA. Any other medications for the same dx will also require a PA. Please advise.

## 2015-05-18 NOTE — Telephone Encounter (Signed)
Use  The dx anxiety and depression

## 2015-05-18 NOTE — Telephone Encounter (Signed)
What dx was used?--- it should have been anxiety

## 2015-05-29 ENCOUNTER — Other Ambulatory Visit: Payer: Self-pay | Admitting: Endocrinology

## 2015-05-31 ENCOUNTER — Telehealth: Payer: Self-pay

## 2015-05-31 NOTE — Telephone Encounter (Signed)
Refill x1 

## 2015-05-31 NOTE — Telephone Encounter (Signed)
Last seen 04/19/15 and Xanax filled 05/04/15 #60   Please advise     KP

## 2015-06-01 ENCOUNTER — Other Ambulatory Visit: Payer: Self-pay | Admitting: Family Medicine

## 2015-06-01 ENCOUNTER — Other Ambulatory Visit: Payer: Self-pay | Admitting: Endocrinology

## 2015-06-01 MED ORDER — ALPRAZOLAM 0.5 MG PO TABS: ORAL_TABLET | ORAL | Status: DC

## 2015-06-01 NOTE — Telephone Encounter (Signed)
Rx faxed.    KP 

## 2015-06-03 ENCOUNTER — Other Ambulatory Visit: Payer: Self-pay | Admitting: Family Medicine

## 2015-06-04 ENCOUNTER — Ambulatory Visit: Payer: Self-pay | Admitting: Endocrinology

## 2015-06-04 NOTE — Telephone Encounter (Signed)
Appeal approved effective 05/13/15 through 08/28/15. Case Number: [id number] SL.

## 2015-06-11 ENCOUNTER — Ambulatory Visit: Payer: Self-pay | Admitting: Endocrinology

## 2015-06-23 ENCOUNTER — Emergency Department (HOSPITAL_BASED_OUTPATIENT_CLINIC_OR_DEPARTMENT_OTHER)
Admission: EM | Admit: 2015-06-23 | Discharge: 2015-06-23 | Disposition: A | Discharge status: Home / Self Care | Payer: Medicare Other | Attending: Emergency Medicine | Admitting: Emergency Medicine

## 2015-06-23 ENCOUNTER — Emergency Department (HOSPITAL_BASED_OUTPATIENT_CLINIC_OR_DEPARTMENT_OTHER): Payer: Medicare Other

## 2015-06-23 ENCOUNTER — Encounter (HOSPITAL_BASED_OUTPATIENT_CLINIC_OR_DEPARTMENT_OTHER): Payer: Self-pay

## 2015-06-23 DIAGNOSIS — I10 Essential (primary) hypertension: Secondary | ICD-10-CM | POA: Diagnosis not present

## 2015-06-23 DIAGNOSIS — E119 Type 2 diabetes mellitus without complications: Secondary | ICD-10-CM | POA: Insufficient documentation

## 2015-06-23 DIAGNOSIS — Z720 Tobacco use: Secondary | ICD-10-CM | POA: Diagnosis not present

## 2015-06-23 DIAGNOSIS — Y998 Other external cause status: Secondary | ICD-10-CM | POA: Insufficient documentation

## 2015-06-23 DIAGNOSIS — Z7952 Long term (current) use of systemic steroids: Secondary | ICD-10-CM | POA: Diagnosis not present

## 2015-06-23 DIAGNOSIS — Y9389 Activity, other specified: Secondary | ICD-10-CM | POA: Insufficient documentation

## 2015-06-23 DIAGNOSIS — Z7951 Long term (current) use of inhaled steroids: Secondary | ICD-10-CM | POA: Insufficient documentation

## 2015-06-23 DIAGNOSIS — Y9289 Other specified places as the place of occurrence of the external cause: Secondary | ICD-10-CM | POA: Insufficient documentation

## 2015-06-23 DIAGNOSIS — Z79899 Other long term (current) drug therapy: Secondary | ICD-10-CM | POA: Diagnosis not present

## 2015-06-23 DIAGNOSIS — F419 Anxiety disorder, unspecified: Secondary | ICD-10-CM | POA: Diagnosis not present

## 2015-06-23 DIAGNOSIS — K219 Gastro-esophageal reflux disease without esophagitis: Secondary | ICD-10-CM | POA: Diagnosis not present

## 2015-06-23 DIAGNOSIS — S4992XA Unspecified injury of left shoulder and upper arm, initial encounter: Secondary | ICD-10-CM | POA: Diagnosis not present

## 2015-06-23 DIAGNOSIS — J45909 Unspecified asthma, uncomplicated: Secondary | ICD-10-CM | POA: Insufficient documentation

## 2015-06-23 DIAGNOSIS — M25512 Pain in left shoulder: Secondary | ICD-10-CM | POA: Diagnosis not present

## 2015-06-23 DIAGNOSIS — S3992XA Unspecified injury of lower back, initial encounter: Secondary | ICD-10-CM | POA: Diagnosis not present

## 2015-06-23 DIAGNOSIS — W010XXA Fall on same level from slipping, tripping and stumbling without subsequent striking against object, initial encounter: Secondary | ICD-10-CM | POA: Diagnosis not present

## 2015-06-23 DIAGNOSIS — E785 Hyperlipidemia, unspecified: Secondary | ICD-10-CM | POA: Insufficient documentation

## 2015-06-23 DIAGNOSIS — W19XXXA Unspecified fall, initial encounter: Secondary | ICD-10-CM

## 2015-06-23 DIAGNOSIS — R0781 Pleurodynia: Secondary | ICD-10-CM | POA: Diagnosis not present

## 2015-06-23 MED ORDER — ACETAMINOPHEN 325 MG PO TABS
650.0000 mg | ORAL_TABLET | Freq: Once | ORAL | Status: AC
  Administered 2015-06-23: 650 mg via ORAL
  Filled 2015-06-23: qty 2

## 2015-06-23 NOTE — Discharge Instructions (Signed)
No broken bones today. Take Tylenol as directed for pain. You will likely be sore for several days. Call Dr. Etter Sjogren to schedule an office visit if your pain is not well controlled or if you are not feeling better by next week.

## 2015-06-23 NOTE — ED Notes (Signed)
Patient transported to X-ray 

## 2015-06-23 NOTE — ED Notes (Signed)
MD at bedside. 

## 2015-06-23 NOTE — ED Notes (Signed)
Per son pt ripped while walking dog, pain to left shoulder-denies head/neck injury and LOC-steady gait

## 2015-06-23 NOTE — ED Provider Notes (Signed)
CSN: [id number]     Arrival date & time 06/23/15  1346 History   First MD Initiated Contact with Patient 06/23/15 1405     Chief Complaint  Patient presents with  . Fall     (Consider location/radiation/quality/duration/timing/severity/associated sxs/prior Treatment) HPI Patient was feeling well until she tripped and fell while walking her dog on uneven pavement 2.5 hours ago. She complains of left shoulder pain, nonradiating and left upper anterior rib pain, nonradiating. Denies shortness of breath. Denies hitting her head. No other injury. No loss of consciousness. No treatment prior to coming here. Pain is worse with changing position improved with remaining still. No other associated symptoms Past Medical History  Diagnosis Date  . Hypertension   . Hyperlipemia   . Asthma   . Diabetes mellitus without complication ([place])   . Asthma   . Back pain   . Seizures ([place])   . Anxiety   . 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 GERD (gastroesophageal reflux disease) 01/24/2012  . Emphysema of lung ([place])   . Hyperlipidemia   . Ulcer   . Hemorrhoid    Past Surgical History  Procedure Laterality Date  . 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 Colon surgery      colon perforation  . Appendectomy    . Candida esophagitis    . Pylorus ulcer    . [place]   Family History  Problem Relation Age of Onset  . Hypertension Mother   . Hypertension Brother   . Heart attack Mother   . Diabetes Father   . Asthma Father   . Asthma Son   . Lung cancer Father   . Esophageal cancer Brother   . Liver cancer Brother     mets from esophagus  . Colon cancer Neg Hx    Social History  Substance Use Topics  . Smoking status: Current Every Day Smoker -- 0.75 packs/day    Types: Cigarettes  . Smokeless tobacco: Never Used     Comment: Started smoking at age 4.form given 04-02-13  . Alcohol Use: No   OB History    No data available     Review of Systems  Constitutional:  Negative.   HENT: Negative.   Respiratory: Negative.   Cardiovascular: Positive for chest pain.       Left rib pain  Gastrointestinal: Negative.   Musculoskeletal: Positive for arthralgias.       Left shoulder pain  Skin: Negative.   Neurological: Negative.   Psychiatric/Behavioral: Negative.   All other systems reviewed and are negative.     Allergies  Tomato  Home Medications   Prior to Admission medications   Medication Sig Start Date End Date Taking? Authorizing Provider  alendronate (FOSAMAX) 70 MG tablet Take 1 tablet (70 mg total) by mouth every 7 (seven) days. Take with a full glass of water on an empty stomach. 09/11/12   Rosalita Chessman, DO  ALPRAZolam (XANAX) 0.5 MG tablet TAKE 1/2 TO 1 TABLET BY MOUTH TWICE DAILY AS NEEDED 06/01/15   Rosalita Chessman, DO  atorvastatin (LIPITOR) 40 MG tablet Take 1 tablet (40 mg total) by mouth daily. 05/05/15   Rosalita Chessman, DO  budesonide-formoterol (SYMBICORT) 80-4.5 MCG/ACT inhaler Inhale 2 puffs into the lungs as needed. 02/19/13   Rosalita Chessman, DO  cyclobenzaprine (FLEXERIL) 10 MG tablet 1/2 tab po tid prn 08/13/14   Rosalita Chessman, DO  diclofenac (  CATAFLAM) 50 MG tablet Take 1 tablet (50 mg total) by mouth 2 (two) times daily. 01/18/15   Percell Miller Saguier, PA-C  ezetimibe (ZETIA) 10 MG tablet Take 1 tablet (10 mg total) by mouth daily. 04/30/15   Rosalita Chessman, DO  famotidine (PEPCID) 20 MG tablet Take 1 tablet (20 mg total) by mouth 2 (two) times daily. 08/07/14   Robbie Lis, MD  feeding supplement, ENSURE COMPLETE, (ENSURE COMPLETE) LIQD Take 237 mLs by mouth 2 (two) times daily between meals. 08/06/14   Robbie Lis, MD  fenofibrate 160 MG tablet Take 1 tablet (160 mg total) by mouth daily. 12/10/14   Rosalita Chessman, DO  ferrous sulfate 325 (65 FE) MG tablet TAKE 1 TABLET BY MOUTH TWICE DAILY BEFORE LUNCH AND SUPPER 06/22/14   Rosalita Chessman, DO  FLUoxetine (PROZAC) 20 MG capsule TAKE 1 CAPSULE BY MOUTH DAILY 04/05/15   Rosalita Chessman,  DO  hyoscyamine (LEVBID) 0.375 MG 12 hr tablet Take 1 tablet (0.375 mg total) by mouth 2 (two) times daily. 07/28/14   Rosalita Chessman, DO  levothyroxine (SYNTHROID, LEVOTHROID) 25 MCG tablet Take 1 tablet (25 mcg total) by mouth daily before breakfast. 08/06/14   Robbie Lis, MD  meloxicam (MOBIC) 7.5 MG tablet Take 1 tablet (7.5 mg total) by mouth daily. 08/13/14   Rosalita Chessman, DO  potassium chloride SA (K-DUR,KLOR-CON) 20 MEQ tablet Take 1 tablet (20 mEq total) by mouth daily. 04/15/13   Rosalita Chessman, DO  pramipexole (MIRAPEX) 1 MG tablet TAKE 1 TABLET BY MOUTH EVERY NIGHT AT BEDTIME 07/20/14   Rosalita Chessman, DO  predniSONE (DELTASONE) 5 MG tablet TAKE 1 TABLET BY MOUTH EVERY DAY 05/31/15   Renato Shin, MD  predniSONE (DELTASONE) 5 MG tablet Take 1 tablet by mouth daily 06/01/15   Renato Shin, MD  [facility] HFA 108 (90 BASE) MCG/ACT inhaler Inhale 1-2 puffs into the lungs every 6 (six) hours as needed for wheezing or shortness of breath.  09/20/12   Historical Provider, MD   BP 127/72 mmHg  Pulse 70  Temp(Src) 97.4 F (36.3 C) (Oral)  Resp 16  Ht 5' (1.524 m)  Wt 114 lb (51.71 kg)  BMI 22.26 kg/m2  SpO2 98% Physical Exam  Constitutional: She is oriented to person, place, and time. She appears well-developed and well-nourished.  HENT:  Head: Normocephalic and atraumatic.  Eyes: Conjunctivae are normal. Pupils are equal, round, and reactive to light.  Neck: Neck supple. No tracheal deviation present. No thyromegaly present.  Cardiovascular: Normal rate and regular rhythm.   No murmur heard. Pulmonary/Chest: Effort normal and breath sounds normal.  Left anterior chest wall tender at upper ribs. No deformity no ecchymosis is  Abdominal: Soft. Bowel sounds are normal. She exhibits no distension. There is no tenderness.  Musculoskeletal: Normal range of motion. She exhibits no edema or tenderness.  Entire spine nontender. Pelvis stable and nontender Left upper extremity tender at  shoulder. Full range of motion, with pain. Radial pulse 2+. All other extremities without contusion abrasion or tenderness neurovascularly intact  Neurological: She is alert and oriented to person, place, and time. Coordination normal.  Gait normal  Skin: Skin is warm and dry. No rash noted.  Psychiatric: She has a normal mood and affect.  Nursing note and vitals reviewed.   ED Course  Procedures (including critical care time) Labs Review Labs Reviewed - No data to display  Imaging Review No results found. I have personally  reviewed and evaluated these images and lab results as part of my medical decision-making.   EKG Interpretation None     3:10 PM pain improved after treatment with Tylenol. She feels ready to go home X-rays viewed by me. Results for orders placed or performed in visit on 04/19/15  Lipid panel  Result Value Ref Range   Cholesterol 280 (H) 0 - 200 mg/dL   Triglycerides 117.0 0.0 - 149.0 mg/dL   HDL 62.30 >39.00 mg/dL   VLDL 23.4 0.0 - 40.0 mg/dL   LDL Cholesterol 195 (H) 0 - 99 mg/dL   Total CHOL/HDL Ratio 5    NonHDL 218.15   Comprehensive metabolic panel  Result Value Ref Range   Sodium 137 135 - 145 mEq/L   Potassium 4.3 3.5 - 5.1 mEq/L   Chloride 100 96 - 112 mEq/L   CO2 28 19 - 32 mEq/L   Glucose, Bld 88 70 - 99 mg/dL   BUN 22 6 - 23 mg/dL   Creatinine, Ser 1.07 0.40 - 1.20 mg/dL   Total Bilirubin 0.6 0.2 - 1.2 mg/dL   Alkaline Phosphatase 43 39 - 117 U/L   AST 28 0 - 37 U/L   ALT 17 0 - 35 U/L   Total Protein 7.2 6.0 - 8.3 g/dL   Albumin 4.1 3.5 - 5.2 g/dL   Calcium 9.4 8.4 - 10.5 mg/dL   GFR 53.18 (L) >60.00 mL/min   Dg Ribs Unilateral W/chest Left  06/23/2015  CLINICAL DATA:  Fall, injury, left rib pain EXAM: LEFT RIBS AND CHEST - 3+ VIEW COMPARISON:  08/04/2014 FINDINGS: Stable heart size and vascularity. Lungs remain clear. No focal pneumonia, collapse or consolidation. No edema, effusion, or pneumothorax. Mild scoliosis of the spine.  Remote left fifth and sixth rib fractures. No definite acute osseous finding or rib abnormality. Normal bowel gas pattern. IMPRESSION: Remote left rib fractures. No acute finding or pneumothorax by plain radiography. Electronically Signed   By: Jerilynn Mages.  Shick M.D.   On: 06/23/2015 14:43   Dg Shoulder Left  06/23/2015  CLINICAL DATA:  Status post trip and fall 06/22/2015 with a left shoulder injury. Left shoulder pain. Initial encounter. EXAM: LEFT SHOULDER - 2+ VIEW COMPARISON:  None. FINDINGS: There is no evidence of fracture or dislocation. There is no evidence of arthropathy or other focal bone abnormality. Soft tissues are unremarkable. IMPRESSION: Negative exam. Electronically Signed   By: Inge Rise M.D.   On: 06/23/2015 14:38    MDM  Plan tylenol prn pain.  F/u with Dr. Etter Sjogren if signifibant pain by next week Dx #1 fall #2 contusions to multiple sites Final diagnoses:  None        Orlie Dakin, MD 06/23/15 1516

## 2015-07-19 ENCOUNTER — Other Ambulatory Visit: Payer: Self-pay | Admitting: Family Medicine

## 2015-07-23 ENCOUNTER — Other Ambulatory Visit: Payer: Self-pay | Admitting: Family Medicine

## 2015-07-26 ENCOUNTER — Telehealth: Payer: Self-pay | Admitting: *Deleted

## 2015-07-26 MED ORDER — ALPRAZOLAM 0.5 MG PO TABS: ORAL_TABLET | ORAL | Status: DC

## 2015-07-26 NOTE — Telephone Encounter (Signed)
rx refill -  xanax 0.5mg  Last OV- 04/19/15 Last refilled - 06/01/15 #60 / 0 rf  UDS- 12/07/14 Low risk

## 2015-07-26 NOTE — Telephone Encounter (Signed)
Alprazolam Refill: Last Rx: 06/01/15, #60. Next f/u:  08/10/15 Last UDS:  12/07/14 Next UDS due 05/2015. Will need to provide UDS when she picks up Rx. Rx printed and forwarded to NP, Inda Castle.

## 2015-07-26 NOTE — Telephone Encounter (Signed)
See rx. 

## 2015-07-27 ENCOUNTER — Other Ambulatory Visit: Payer: Self-pay | Admitting: Family Medicine

## 2015-07-27 NOTE — Telephone Encounter (Signed)
Rx placed at front desk and pt has been made aware of below and voices understanding.

## 2015-07-27 NOTE — Telephone Encounter (Signed)
See 07/23/15 phone note / response.

## 2015-07-28 NOTE — Telephone Encounter (Signed)
Medication filled to pharmacy as requested.   

## 2015-07-30 ENCOUNTER — Other Ambulatory Visit: Payer: Self-pay | Admitting: Family Medicine

## 2015-07-30 NOTE — Telephone Encounter (Signed)
Pt called stating she could not come today. Advised RX will be at desk when she can come in for UDS.

## 2015-08-10 ENCOUNTER — Encounter: Payer: Self-pay | Admitting: Family Medicine

## 2015-08-10 ENCOUNTER — Ambulatory Visit (INDEPENDENT_AMBULATORY_CARE_PROVIDER_SITE_OTHER): Payer: Medicare Other | Admitting: Family Medicine

## 2015-08-10 VITALS — BP 116/80 | HR 86 | Temp 98.7°F | Ht 60.0 in | Wt 130.2 lb

## 2015-08-10 DIAGNOSIS — F17200 Nicotine dependence, unspecified, uncomplicated: Secondary | ICD-10-CM

## 2015-08-10 DIAGNOSIS — F419 Anxiety disorder, unspecified: Secondary | ICD-10-CM

## 2015-08-10 DIAGNOSIS — F418 Other specified anxiety disorders: Secondary | ICD-10-CM | POA: Diagnosis not present

## 2015-08-10 DIAGNOSIS — Z23 Encounter for immunization: Secondary | ICD-10-CM | POA: Diagnosis not present

## 2015-08-10 DIAGNOSIS — E038 Other specified hypothyroidism: Secondary | ICD-10-CM

## 2015-08-10 DIAGNOSIS — J45909 Unspecified asthma, uncomplicated: Secondary | ICD-10-CM

## 2015-08-10 DIAGNOSIS — J4489 Other specified chronic obstructive pulmonary disease: Secondary | ICD-10-CM

## 2015-08-10 DIAGNOSIS — J449 Chronic obstructive pulmonary disease, unspecified: Secondary | ICD-10-CM

## 2015-08-10 DIAGNOSIS — F32A Depression, unspecified: Secondary | ICD-10-CM

## 2015-08-10 DIAGNOSIS — F329 Major depressive disorder, single episode, unspecified: Secondary | ICD-10-CM

## 2015-08-10 DIAGNOSIS — E785 Hyperlipidemia, unspecified: Secondary | ICD-10-CM

## 2015-08-10 MED ORDER — SERTRALINE HCL 25 MG PO TABS: 25.0000 mg | ORAL_TABLET | Freq: Every day | ORAL | Status: DC

## 2015-08-10 NOTE — Assessment & Plan Note (Signed)
D/c prozac Start zoloft F/u 6 months or sooner prn

## 2015-08-10 NOTE — Progress Notes (Signed)
Pre visit review using our clinic review tool, if applicable. No additional management support is needed unless otherwise documented below in the visit note. 

## 2015-08-10 NOTE — Assessment & Plan Note (Signed)
con't zetia and lipitor Check labs today

## 2015-08-10 NOTE — Progress Notes (Signed)
Patient ID: Monica Copeland, female    DOB: 1941/03/25  Age: 74 y.o. MRN: [id number]    Subjective:  Subjective HPI Monica Copeland presents for f/u cholesterol and her ins wants her to change her prozac.  She has brought in forms for alternative.  zoloft is acceptable.     Review of Systems  Constitutional: Negative for diaphoresis, appetite change, fatigue and unexpected weight change.  Eyes: Negative for pain, redness and visual disturbance.  Respiratory: Negative for cough, chest tightness, shortness of breath and wheezing.   Cardiovascular: Negative for chest pain, palpitations and leg swelling.  Endocrine: Negative for cold intolerance, heat intolerance, polydipsia, polyphagia and polyuria.  Genitourinary: Negative for dysuria, frequency and difficulty urinating.  Neurological: Negative for dizziness, light-headedness, numbness and headaches.  All other systems reviewed and are negative.   History Past Medical History  Diagnosis Date  . Hypertension   . Hyperlipemia   . Asthma   . Diabetes mellitus without complication ([place])   . Asthma   . Back pain   . Seizures ([place])   . Anxiety   . 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 GERD (gastroesophageal reflux disease) 01/24/2012  . Emphysema of lung ([place])   . Hyperlipidemia   . Ulcer   . Hemorrhoid     She has past surgical history that includes Brain surgery (1987); Colon surgery; Appendectomy; candida esophagitis; pylorus ulcer; and Abdominal hysterectomy.   Her family history includes Asthma in her father and son; Diabetes in her father; Esophageal cancer in her brother; Heart attack in her mother; Hypertension in her brother and mother; Liver cancer in her brother; Lung cancer in her father. There is no history of Colon cancer.She reports that she has been smoking Cigarettes.  She has been smoking about 0.75 packs per day. She has never used smokeless tobacco. She reports that she does not drink alcohol or use illicit  drugs.  Current Outpatient Prescriptions on File Prior to Visit  Medication Sig Dispense Refill  . ALPRAZolam (XANAX) 0.5 MG tablet TAKE 1/2 TO 1 TABLET BY MOUTH 2 TIMES A DAY AS NEEDED. 60 tablet 0  . atorvastatin (LIPITOR) 40 MG tablet Take 1 tablet (40 mg total) by mouth daily. 30 tablet 2  . budesonide-formoterol (SYMBICORT) 80-4.5 MCG/ACT inhaler Inhale 2 puffs into the lungs as needed.    . cyclobenzaprine (FLEXERIL) 10 MG tablet 1/2 tab po tid prn 60 tablet 0  . diclofenac (CATAFLAM) 50 MG tablet Take 1 tablet (50 mg total) by mouth 2 (two) times daily. 14 tablet 0  . famotidine (PEPCID) 20 MG tablet Take 1 tablet (20 mg total) by mouth 2 (two) times daily. 60 tablet 0  . feeding supplement, ENSURE COMPLETE, (ENSURE COMPLETE) LIQD Take 237 mLs by mouth 2 (two) times daily between meals. 237 mL 0  . fenofibrate 160 MG tablet TAKE 1 TABLET BY MOUTH ONCE DAILY 30 tablet 4  . ferrous sulfate 325 (65 FE) MG tablet TAKE 1 TABLET BY MOUTH TWICE A DAY BEFORE LUNCH AND SUPPER 60 tablet 5  . hyoscyamine (LEVBID) 0.375 MG 12 hr tablet Take 1 tablet (0.375 mg total) by mouth 2 (two) times daily. 60 tablet 0  . levothyroxine (SYNTHROID, LEVOTHROID) 25 MCG tablet Take 1 tablet (25 mcg total) by mouth daily before breakfast. 30 tablet 0  . meloxicam (MOBIC) 7.5 MG tablet Take 1 tablet (7.5 mg total) by mouth daily. 30 tablet 5  . potassium chloride SA (K-DUR,KLOR-CON) 20 MEQ tablet Take 1 tablet (  20 mEq total) by mouth daily. 30 tablet 2  . pramipexole (MIRAPEX) 1 MG tablet TAKE 1 TABLET BY MOUTH EVERY NIGHT AT BEDTIME 60 tablet 1  . predniSONE (DELTASONE) 5 MG tablet TAKE 1 TABLET BY MOUTH EVERY DAY 30 tablet 0  . PROAIR HFA 108 (90 BASE) MCG/ACT inhaler Inhale 1-2 puffs into the lungs every 6 (six) hours as needed for wheezing or shortness of breath.     Marland Kitchen ZETIA 10 MG tablet TAKE 1 TABLET BY MOUTH DAILY 30 tablet 2  . alendronate (FOSAMAX) 70 MG tablet Take 1 tablet (70 mg total) by mouth every 7  (seven) days. Take with a full glass of water on an empty stomach. 4 tablet 11   No current facility-administered medications on file prior to visit.     Objective:  Objective Physical Exam  Constitutional: She is oriented to person, place, and time. She appears well-developed and well-nourished.  HENT:  Head: Normocephalic and atraumatic.  Eyes: Conjunctivae and EOM are normal.  Neck: Normal range of motion. Neck supple. No JVD present. Carotid bruit is not present. No thyromegaly present.  Cardiovascular: Normal rate, regular rhythm and normal heart sounds.   No murmur heard. Pulmonary/Chest: Effort normal and breath sounds normal. No respiratory distress. She has no wheezes. She has no rales. She exhibits no tenderness.  Musculoskeletal: She exhibits no edema.  Neurological: She is alert and oriented to person, place, and time.  Psychiatric: She has a normal mood and affect. Her behavior is normal.   BP 116/80 mmHg  Pulse 86  Temp(Src) 98.7 F (37.1 C) (Oral)  Ht 5' (1.524 m)  Wt 130 lb 3.2 oz (59.058 kg)  BMI 25.43 kg/m2  SpO2 97% Wt Readings from Last 3 Encounters:  08/10/15 130 lb 3.2 oz (59.058 kg)  06/23/15 114 lb (51.71 kg)  05/07/15 123 lb (55.792 kg)     Lab Results  Component Value Date   WBC 14.4* 12/07/2014   HGB 15.6* 12/07/2014   HCT 45.9 12/07/2014   PLT 218.0 12/07/2014   GLUCOSE 88 04/19/2015   CHOL 280* 04/19/2015   TRIG 117.0 04/19/2015   HDL 62.30 04/19/2015   LDLDIRECT 128.6 05/28/2014   LDLCALC 195* 04/19/2015   ALT 17 04/19/2015   AST 28 04/19/2015   NA 137 04/19/2015   K 4.3 04/19/2015   CL 100 04/19/2015   CREATININE 1.07 04/19/2015   BUN 22 04/19/2015   CO2 28 04/19/2015   TSH 2.04 12/07/2014   TSH 1.340 12/07/2014   INR 1.02 04/24/2012   HGBA1C 6.3 10/28/2013   MICROALBUR 0.9 12/07/2014    Dg Ribs Unilateral W/chest Left  06/23/2015  CLINICAL DATA:  Fall, injury, left rib pain EXAM: LEFT RIBS AND CHEST - 3+ VIEW COMPARISON:   08/04/2014 FINDINGS: Stable heart size and vascularity. Lungs remain clear. No focal pneumonia, collapse or consolidation. No edema, effusion, or pneumothorax. Mild scoliosis of the spine. Remote left fifth and sixth rib fractures. No definite acute osseous finding or rib abnormality. Normal bowel gas pattern. IMPRESSION: Remote left rib fractures. No acute finding or pneumothorax by plain radiography. Electronically Signed   By: Jerilynn Mages.  Shick M.D.   On: 06/23/2015 14:43   Dg Shoulder Left  06/23/2015  CLINICAL DATA:  Status post trip and fall 06/22/2015 with a left shoulder injury. Left shoulder pain. Initial encounter. EXAM: LEFT SHOULDER - 2+ VIEW COMPARISON:  None. FINDINGS: There is no evidence of fracture or dislocation. There is no evidence of arthropathy or  other focal bone abnormality. Soft tissues are unremarkable. IMPRESSION: Negative exam. Electronically Signed   By: Inge Rise M.D.   On: 06/23/2015 14:38     Assessment & Plan:  Plan I have discontinued Ms. Rottenberg's FLUoxetine. I am also having her start on sertraline. Additionally, I am having her maintain her alendronate, PROAIR HFA, budesonide-formoterol, potassium chloride SA, pramipexole, hyoscyamine, feeding supplement (ENSURE COMPLETE), levothyroxine, famotidine, meloxicam, cyclobenzaprine, diclofenac, atorvastatin, predniSONE, ferrous sulfate, fenofibrate, ALPRAZolam, and ZETIA.  Meds ordered this encounter  Medications  . sertraline (ZOLOFT) 25 MG tablet    Sig: Take 1 tablet (25 mg total) by mouth daily.    Dispense:  30 tablet    Refill:  5    Problem List Items Addressed This Visit    Tobacco use disorder    Ho given to pt Pt will try to cut down      Hyperlipidemia    con't zetia and lipitor Check labs today      Relevant Orders   Comp Met (CMET)   CBC with Differential/Platelet   Lipid panel   COPD with asthma ([place])    D/w pt smoking cessation-- she states she will try to cut down F/u pulmonary       Anxiety and depression    D/c prozac Start zoloft F/u 6 months or sooner prn       Other Visit Diagnoses    Other specified anxiety disorders    -  Primary    Relevant Medications    sertraline (ZOLOFT) 25 MG tablet    Other specified hypothyroidism        Relevant Orders    TSH    Need for vaccination with 13-polyvalent pneumococcal conjugate vaccine        Relevant Orders    Pneumococcal conjugate vaccine 13-valent (Completed)       Follow-up: Return in about 6 months (around 02/08/2016), or if symptoms worsen or fail to improve, for annual exam, fasting.  Garnet Koyanagi, DO

## 2015-08-10 NOTE — Patient Instructions (Addendum)
Cholesterol Cholesterol is a white, waxy, fat-like substance needed by your body in small amounts. The liver makes all the cholesterol you need. Cholesterol is carried from the liver by the blood through the blood vessels. Deposits of cholesterol (plaque) may build up on blood vessel walls. These make the arteries narrower and stiffer. Cholesterol plaques increase the risk for heart attack and stroke.  You cannot feel your cholesterol level even if it is very high. The only way to know it is high is with a blood test. Once you know your cholesterol levels, you should keep a record of the test results. Work with your health care provider to keep your levels in the desired range.  WHAT DO THE RESULTS MEAN?  Total cholesterol is a rough measure of all the cholesterol in your blood.   LDL is the so-called bad cholesterol. This is the type that deposits cholesterol in the walls of the arteries. You want this level to be low.   HDL is the good cholesterol because it cleans the arteries and carries the LDL away. You want this level to be high.  Triglycerides are fat that the body can either burn for energy or store. High levels are closely linked to heart disease.  WHAT ARE THE DESIRED LEVELS OF CHOLESTEROL?  Total cholesterol below 200.   LDL below 100 for people at risk, below 70 for those at very high risk.   HDL above 50 is good, above 60 is best.   Triglycerides below 150.  HOW CAN I LOWER MY CHOLESTEROL?  Diet. Follow your diet programs as directed by your health care provider.   Choose fish or white meat chicken and [place], roasted or baked. Limit fatty cuts of red meat, fried foods, and processed meats, such as sausage and lunch meats.   Eat lots of fresh fruits and vegetables.  Choose whole grains, beans, pasta, potatoes, and cereals.   Use only small amounts of olive, corn, or canola oils.   Avoid butter, mayonnaise, shortening, or palm kernel oils.  Avoid foods with  trans fats.   Drink skim or nonfat milk and eat low-fat or nonfat yogurt and cheeses. Avoid whole milk, cream, ice cream, egg yolks, and full-fat cheeses.   Healthy desserts include angel food cake, ginger snaps, animal crackers, hard candy, popsicles, and low-fat or nonfat frozen yogurt. Avoid pastries, cakes, pies, and cookies.   Exercise. Follow your exercise programs as directed by your health care provider.   A regular program helps decrease LDL and raise HDL.   A regular program helps with weight control.   Do things that increase your activity level like gardening, walking, or taking the stairs. Ask your health care provider about how you can be more active in your daily life.   Medicine. Take medicine only as directed by your health care provider.   Medicine may be prescribed by your health care provider to help lower cholesterol and decrease the risk for heart disease.   If you have several risk factors, you may need medicine even if your levels are normal.   This information is not intended to replace advice given to you by your health care provider. Make sure you discuss any questions you have with your health care provider.   Document Released: 05/09/2001 Document Revised: 09/04/2014 Document Reviewed: 05/28/2013 Elsevier Interactive Patient Education 2016 [employer].  Smoking Cessation, Tips for Success If you are ready to quit smoking, congratulations! You have chosen to help yourself be healthier. Cigarettes  bring nicotine, tar, carbon monoxide, and other irritants into your body. Your lungs, heart, and blood vessels will be able to work better without these poisons. There are many different ways to quit smoking. Nicotine gum, nicotine patches, a nicotine inhaler, or nicotine nasal spray can help with physical craving. Hypnosis, support groups, and medicines help break the habit of smoking. WHAT THINGS CAN I DO TO MAKE QUITTING EASIER?  Here are some tips to  help you quit for good:  Pick a date when you will quit smoking completely. Tell all of your friends and family about your plan to quit on that date.  Do not try to slowly cut down on the number of cigarettes you are smoking. Pick a quit date and quit smoking completely starting on that day.  Throw away all cigarettes.   Clean and remove all ashtrays from your home, work, and car.  On a card, write down your reasons for quitting. Carry the card with you and read it when you get the urge to smoke.  Cleanse your body of nicotine. Drink enough water and fluids to keep your urine clear or pale yellow. Do this after quitting to flush the nicotine from your body.  Learn to predict your moods. Do not let a bad situation be your excuse to have a cigarette. Some situations in your life might tempt you into wanting a cigarette.  Never have "just one" cigarette. It leads to wanting another and another. Remind yourself of your decision to quit.  Change habits associated with smoking. If you smoked while driving or when feeling stressed, try other activities to replace smoking. Stand up when drinking your coffee. Brush your teeth after eating. Sit in a different chair when you read the paper. Avoid alcohol while trying to quit, and try to drink fewer caffeinated beverages. Alcohol and caffeine may urge you to smoke.  Avoid foods and drinks that can trigger a desire to smoke, such as sugary or spicy foods and alcohol.  Ask people who smoke not to smoke around you.  Have something planned to do right after eating or having a cup of coffee. For example, plan to take a walk or exercise.  Try a relaxation exercise to calm you down and decrease your stress. Remember, you may be tense and nervous for the first 2 weeks after you quit, but this will pass.  Find new activities to keep your hands busy. Play with a pen, coin, or rubber band. Doodle or draw things on paper.  Brush your teeth right after eating.  This will help cut down on the craving for the taste of tobacco after meals. You can also try mouthwash.   Use oral substitutes in place of cigarettes. Try using lemon drops, carrots, cinnamon sticks, or chewing gum. Keep them handy so they are available when you have the urge to smoke.  When you have the urge to smoke, try deep breathing.  Designate your home as a nonsmoking area.  If you are a heavy smoker, ask your health care provider about a prescription for nicotine chewing gum. It can ease your withdrawal from nicotine.  Reward yourself. Set aside the cigarette money you save and buy yourself something nice.  Look for support from others. Join a support group or smoking cessation program. Ask someone at home or at work to help you with your plan to quit smoking.  Always ask yourself, "Do I need this cigarette or is this just a reflex?" Tell yourself, "Today,  I choose not to smoke," or "I do not want to smoke." You are reminding yourself of your decision to quit.  Do not replace cigarette smoking with electronic cigarettes (commonly called e-cigarettes). The safety of e-cigarettes is unknown, and some may contain harmful chemicals.  If you relapse, do not give up! Plan ahead and think about what you will do the next time you get the urge to smoke. HOW WILL I FEEL WHEN I QUIT SMOKING? You may have symptoms of withdrawal because your body is used to nicotine (the addictive substance in cigarettes). You may crave cigarettes, be irritable, feel very hungry, cough often, get headaches, or have difficulty concentrating. The withdrawal symptoms are only temporary. They are strongest when you first quit but will go away within 10-14 days. When withdrawal symptoms occur, stay in control. Think about your reasons for quitting. Remind yourself that these are signs that your body is healing and getting used to being without cigarettes. Remember that withdrawal symptoms are easier to treat than the  major diseases that smoking can cause.  Even after the withdrawal is over, expect periodic urges to smoke. However, these cravings are generally short lived and will go away whether you smoke or not. Do not smoke! WHAT RESOURCES ARE AVAILABLE TO HELP ME QUIT SMOKING? Your health care provider can direct you to community resources or hospitals for support, which may include:  Group support.  Education.  Hypnosis.  Therapy.   This information is not intended to replace advice given to you by your health care provider. Make sure you discuss any questions you have with your health care provider.   Document Released: 05/12/2004 Document Revised: 09/04/2014 Document Reviewed: 01/30/2013 Elsevier Interactive Patient Education [employer].

## 2015-08-10 NOTE — Assessment & Plan Note (Signed)
D/w pt smoking cessation-- she states she will try to cut down F/u pulmonary

## 2015-08-10 NOTE — Assessment & Plan Note (Signed)
Ho given to pt Pt will try to cut down

## 2015-08-19 ENCOUNTER — Other Ambulatory Visit: Payer: Self-pay | Admitting: Family Medicine

## 2015-08-21 ENCOUNTER — Other Ambulatory Visit: Payer: Self-pay | Admitting: Family Medicine

## 2015-08-27 ENCOUNTER — Telehealth: Payer: Self-pay | Admitting: *Deleted

## 2015-08-27 MED ORDER — ALPRAZOLAM 0.5 MG PO TABS: 0.2500 mg | ORAL_TABLET | Freq: Two times a day (BID) | ORAL | Status: DC | PRN

## 2015-08-27 NOTE — Telephone Encounter (Signed)
Okay #60, no refills 

## 2015-08-27 NOTE — Telephone Encounter (Signed)
Rx printed, awaiting MD signature. Rx faxed to pharmacy.

## 2015-08-27 NOTE — Telephone Encounter (Signed)
Requesting Alprazolam 0.5mg -Take 1/2 to 1 tablet by mouth 2 times a day as needed. Last refill:07/28/15;#60,0 Last OV:08/10/15 UDS:12/07/14-Low risk-Next screen Please advise.//AB/CMA

## 2015-09-13 ENCOUNTER — Telehealth: Payer: Self-pay | Admitting: Endocrinology

## 2015-09-13 NOTE — Telephone Encounter (Signed)
Patient Name: Monica Copeland Gender: Female DOB: 1941-04-22 Age: 75 Y 1 M 6 D Return Phone Number: [phone] (Primary) Address: City/State/Zip: [place] Client [facility] - Client Client Site [facility] Physician Renato Shin Contact Type Call [place] Name [place] Phone Number [phone] Relationship To Patient Self Is this call to report lab results? No Call Type General Information Initial Comment [name] states she needs to reschedule appt General Information Type Appointment  Called patient to let her know her appointment was canceled, she stated tha tshe was sick and will cal back. pb

## 2015-09-13 NOTE — Telephone Encounter (Signed)
Pt will call back to reschedule appointment.

## 2015-09-13 NOTE — Telephone Encounter (Signed)
error 

## 2015-09-13 NOTE — Telephone Encounter (Signed)
Noted  

## 2015-09-13 NOTE — Telephone Encounter (Signed)
Patient Name: Monica Copeland Gender: Female DOB: 09-Aug-1941 Age: 75 Y 1 M 6 D Return Phone Number: [phone] (Primary) Address: City/State/Zip: [place] Client [facility] - Client Client Site [facility] Physician Renato Shin Contact Type Call [place] Name [place] Phone Number [phone] Relationship To Patient Self Is this call to report lab results? No Call Type General Information Initial Comment [name] states she needs to reschedule appt General Information Type Appointment

## 2015-09-15 ENCOUNTER — Ambulatory Visit: Payer: Self-pay | Admitting: Endocrinology

## 2015-09-21 ENCOUNTER — Encounter: Payer: Self-pay | Admitting: Family Medicine

## 2015-09-21 ENCOUNTER — Telehealth: Payer: Self-pay | Admitting: Family Medicine

## 2015-09-21 ENCOUNTER — Ambulatory Visit (INDEPENDENT_AMBULATORY_CARE_PROVIDER_SITE_OTHER): Payer: Medicare Other | Admitting: Family Medicine

## 2015-09-21 VITALS — BP 110/68 | HR 62 | Temp 97.7°F | Wt 133.2 lb

## 2015-09-21 DIAGNOSIS — J209 Acute bronchitis, unspecified: Secondary | ICD-10-CM | POA: Diagnosis not present

## 2015-09-21 DIAGNOSIS — R109 Unspecified abdominal pain: Secondary | ICD-10-CM

## 2015-09-21 DIAGNOSIS — K589 Irritable bowel syndrome without diarrhea: Secondary | ICD-10-CM | POA: Diagnosis not present

## 2015-09-21 LAB — POCT URINALYSIS DIPSTICK
BILIRUBIN UA: NEGATIVE
Blood, UA: NEGATIVE
GLUCOSE UA: NEGATIVE
Ketones, UA: NEGATIVE
LEUKOCYTES UA: NEGATIVE
NITRITE UA: NEGATIVE
PH UA: 6
PROTEIN UA: NEGATIVE
SPEC GRAV UA: 1.015
Urobilinogen, UA: 0.2

## 2015-09-21 MED ORDER — HYDROCODONE-HOMATROPINE 5-1.5 MG/5ML PO SYRP: ORAL_SOLUTION | ORAL | Status: DC

## 2015-09-21 MED ORDER — HYOSCYAMINE SULFATE 0.125 MG SL SUBL: 0.1250 mg | SUBLINGUAL_TABLET | SUBLINGUAL | Status: DC | PRN

## 2015-09-21 MED ORDER — AZITHROMYCIN 250 MG PO TABS: ORAL_TABLET | ORAL | Status: DC

## 2015-09-21 NOTE — Telephone Encounter (Signed)
Pharmacy: [facility] [address] [place], [place], [place] [address] [phone]    Reason for call:  Patient dropped off Rx to [facility], pharmacy in need of clarification regarding HYDROcodone-homatropine (HYCODAN) 5-1.5 MG/5ML syrup

## 2015-09-21 NOTE — Progress Notes (Signed)
Pre visit review using our clinic review tool, if applicable. No additional management support is needed unless otherwise documented below in the visit note. 

## 2015-09-21 NOTE — Patient Instructions (Signed)
Try alighn or other probiotice prn   Diet for Irritable Bowel Syndrome When you have irritable bowel syndrome (IBS), the foods you eat and your eating habits are very important. IBS may cause various symptoms, such as abdominal pain, constipation, or diarrhea. Choosing the right foods can help ease discomfort caused by these symptoms. Work with your health care provider and dietitian to find the best eating plan to help control your symptoms. WHAT GENERAL GUIDELINES DO I NEED TO FOLLOW?  Keep a food diary. This will help you identify foods that cause symptoms. Write down:  What you eat and when.  What symptoms you have.  When symptoms occur in relation to your meals.  Avoid foods that cause symptoms. Talk with your dietitian about other ways to get the same nutrients that are in these foods.  Eat more foods that contain fiber. Take a fiber supplement if directed by your dietitian.  Eat your meals slowly, in a relaxed setting.  Aim to eat 5-6 small meals per day. Do not skip meals.  Drink enough fluids to keep your urine clear or pale yellow.  Ask your health care provider if you should take an over-the-counter probiotic during flare-ups to help restore healthy gut bacteria.  If you have cramping or diarrhea, try making your meals low in fat and high in carbohydrates. Examples of carbohydrates are pasta, rice, whole grain breads and cereals, fruits, and vegetables.  If dairy products cause your symptoms to flare up, try eating less of them. You might be able to handle yogurt better than other dairy products because it contains bacteria that help with digestion. WHAT FOODS ARE NOT RECOMMENDED? The following are some foods and drinks that may worsen your symptoms:  Fatty foods, such as [place] fries.  Milk products, such as cheese or ice cream.  Chocolate.  Alcohol.  Products with caffeine, such as coffee.  Carbonated drinks, such as soda. The items listed above may not be a  complete list of foods and beverages to avoid. Contact your dietitian for more information. WHAT FOODS ARE GOOD SOURCES OF FIBER? Your health care provider or dietitian may recommend that you eat more foods that contain fiber. Fiber can help reduce constipation and other IBS symptoms. Add foods with fiber to your diet a little at a time so that your body can get used to them. Too much fiber at once might cause gas and swelling of your abdomen. The following are some foods that are good sources of fiber:  Apples.  Peaches.  Pears.  Berries.  Figs.  Broccoli (raw).  Cabbage.  Carrots.  Raw peas.  Kidney beans.  Lima beans.  Whole grain bread.  Whole grain cereal. FOR MORE INFORMATION  International Foundation for Functional Gastrointestinal Disorders: www.iffgd.[employer] of Diabetes and Digestive and Kidney Diseases: NetworkAffair.co.za.aspx   This information is not intended to replace advice given to you by your health care provider. Make sure you discuss any questions you have with your health care provider.   Document Released: 11/04/2003 Document Revised: 09/04/2014 Document Reviewed: 11/14/2013 Elsevier Interactive Patient Education [employer].

## 2015-09-21 NOTE — Telephone Encounter (Signed)
Spoke with pharmacy and they needed the correct sig: I advised 5-10 ml qhs prn.    KP

## 2015-09-21 NOTE — Progress Notes (Signed)
Patient ID: Monica Copeland, female    DOB: [birth year]  Age: 75 y.o. MRN: [id number]    Subjective:  Subjective HPI Monica Copeland presents for C/O cough and congestion , no fever.  Symptoms x 1 week She also c/o abd pain x few weeks.  No nausea or vomitingl,  Review of Systems  Constitutional: Positive for chills. Negative for fever, diaphoresis, appetite change, fatigue and unexpected weight change.  HENT: Positive for congestion. Negative for nosebleeds, postnasal drip, rhinorrhea and sinus pressure.   Eyes: Negative for pain, redness and visual disturbance.  Respiratory: Positive for cough. Negative for chest tightness, shortness of breath and wheezing.   Cardiovascular: Negative for chest pain, palpitations and leg swelling.  Gastrointestinal: Negative for blood in stool and anal bleeding.  Endocrine: Negative for cold intolerance, heat intolerance, polydipsia, polyphagia and polyuria.  Genitourinary: Negative for dysuria, frequency and difficulty urinating.  Allergic/Immunologic: Negative for environmental allergies.  Neurological: Negative for dizziness, light-headedness, numbness and headaches.    History Past Medical History  Diagnosis Date  . Hypertension   . Hyperlipemia   . Asthma   . Diabetes mellitus without complication ([place])   . Asthma   . Back pain   . Seizures ([place])   . Anxiety   . 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 GERD (gastroesophageal reflux disease) 01/24/2012  . Emphysema of lung ([place])   . Hyperlipidemia   . Ulcer   . Hemorrhoid     She has past surgical history that includes Brain surgery (1987); Colon surgery; Appendectomy; candida esophagitis; pylorus ulcer; and Abdominal hysterectomy.   Her family history includes Asthma in her father and son; Diabetes in her father; Esophageal cancer in her brother; Heart attack in her mother; Hypertension in her brother and mother; Liver cancer in her brother; Lung cancer in her father. There is no history of  Colon cancer.She reports that she has been smoking Cigarettes.  She has been smoking about 0.75 packs per day. She has never used smokeless tobacco. She reports that she does not drink alcohol or use illicit drugs.  Current Outpatient Prescriptions on File Prior to Visit  Medication Sig Dispense Refill  . ALPRAZolam (XANAX) 0.5 MG tablet Take 0.5-1 tablets (0.25-0.5 mg total) by mouth 2 (two) times daily as needed for anxiety. 60 tablet 0  . atorvastatin (LIPITOR) 40 MG tablet TAKE 1 TABLET BY MOUTH ONCE DAILY 30 tablet 2  . budesonide-formoterol (SYMBICORT) 80-4.5 MCG/ACT inhaler Inhale 2 puffs into the lungs as needed.    . cyclobenzaprine (FLEXERIL) 10 MG tablet 1/2 tab po tid prn 60 tablet 0  . diclofenac (CATAFLAM) 50 MG tablet Take 1 tablet (50 mg total) by mouth 2 (two) times daily. 14 tablet 0  . famotidine (PEPCID) 20 MG tablet Take 1 tablet (20 mg total) by mouth 2 (two) times daily. 60 tablet 0  . feeding supplement, ENSURE COMPLETE, (ENSURE COMPLETE) LIQD Take 237 mLs by mouth 2 (two) times daily between meals. 237 mL 0  . fenofibrate 160 MG tablet TAKE 1 TABLET BY MOUTH ONCE DAILY 30 tablet 4  . ferrous sulfate 325 (65 FE) MG tablet TAKE 1 TABLET BY MOUTH TWICE A DAY BEFORE LUNCH AND SUPPER 60 tablet 5  . FLUoxetine (PROZAC) 20 MG capsule TAKE 1 CAPSULE BY MOUTH ONCE DAILY 30 capsule 5  . levothyroxine (SYNTHROID, LEVOTHROID) 25 MCG tablet Take 1 tablet (25 mcg total) by mouth daily before breakfast. 30 tablet 0  . meloxicam (MOBIC) 7.5 MG tablet Take  1 tablet (7.5 mg total) by mouth daily. 30 tablet 5  . potassium chloride SA (K-DUR,KLOR-CON) 20 MEQ tablet Take 1 tablet (20 mEq total) by mouth daily. 30 tablet 2  . pramipexole (MIRAPEX) 1 MG tablet TAKE 1 TABLET BY MOUTH EVERY NIGHT AT BEDTIME 60 tablet 1  . predniSONE (DELTASONE) 5 MG tablet TAKE 1 TABLET BY MOUTH EVERY DAY 30 tablet 0  . PROAIR HFA 108 (90 BASE) MCG/ACT inhaler Inhale 1-2 puffs into the lungs every 6 (six) hours as  needed for wheezing or shortness of breath.     . sertraline (ZOLOFT) 25 MG tablet Take 1 tablet (25 mg total) by mouth daily. 30 tablet 5  . ZETIA 10 MG tablet TAKE 1 TABLET BY MOUTH DAILY 30 tablet 2  . alendronate (FOSAMAX) 70 MG tablet Take 1 tablet (70 mg total) by mouth every 7 (seven) days. Take with a full glass of water on an empty stomach. (Patient not taking: Reported on 09/21/2015) 4 tablet 11   No current facility-administered medications on file prior to visit.     Objective:  Objective Physical Exam  Constitutional: She is oriented to person, place, and time. She appears well-developed and well-nourished.  HENT:  Right Ear: External ear normal.  Left Ear: External ear normal.  + PND + errythema  Eyes: Conjunctivae are normal. Right eye exhibits no discharge. Left eye exhibits no discharge.  Cardiovascular: Normal rate, regular rhythm and normal heart sounds.   No murmur heard. Pulmonary/Chest: Effort normal and breath sounds normal. No respiratory distress. She has no wheezes. She has no rales. She exhibits no tenderness.  Musculoskeletal: She exhibits no edema.  Lymphadenopathy:    She has cervical adenopathy.  Neurological: She is alert and oriented to person, place, and time.  Psychiatric: She has a normal mood and affect. Her behavior is normal. Judgment and thought content normal.   BP 110/68 mmHg  Pulse 62  Temp(Src) 97.7 F (36.5 C) (Oral)  Wt 133 lb 3.2 oz (60.419 kg)  SpO2 98% Wt Readings from Last 3 Encounters:  09/21/15 133 lb 3.2 oz (60.419 kg)  08/10/15 130 lb 3.2 oz (59.058 kg)  06/23/15 114 lb (51.71 kg)     Lab Results  Component Value Date   WBC 11.9* 09/21/2015   HGB 14.3 09/21/2015   HCT 44.0 09/21/2015   PLT 272.0 09/21/2015   GLUCOSE 80 09/21/2015   CHOL 280* 04/19/2015   TRIG 117.0 04/19/2015   HDL 62.30 04/19/2015   LDLDIRECT 128.6 05/28/2014   LDLCALC 195* 04/19/2015   ALT 12 09/21/2015   AST 18 09/21/2015   NA 135 09/21/2015    K 4.7 09/21/2015   CL 98 09/21/2015   CREATININE 1.16 09/21/2015   BUN 21 09/21/2015   CO2 30 09/21/2015   TSH 2.04 12/07/2014   TSH 1.340 12/07/2014   INR 1.02 04/24/2012   HGBA1C 6.3 10/28/2013   MICROALBUR 0.9 12/07/2014    Dg Ribs Unilateral W/chest Left  06/23/2015  CLINICAL DATA:  Fall, injury, left rib pain EXAM: LEFT RIBS AND CHEST - 3+ VIEW COMPARISON:  08/04/2014 FINDINGS: Stable heart size and vascularity. Lungs remain clear. No focal pneumonia, collapse or consolidation. No edema, effusion, or pneumothorax. Mild scoliosis of the spine. Remote left fifth and sixth rib fractures. No definite acute osseous finding or rib abnormality. Normal bowel gas pattern. IMPRESSION: Remote left rib fractures. No acute finding or pneumothorax by plain radiography. Electronically Signed   By: Jerilynn Mages.  Shick M.D.  On: 06/23/2015 14:43   Dg Shoulder Left  06/23/2015  CLINICAL DATA:  Status post trip and fall 06/22/2015 with a left shoulder injury. Left shoulder pain. Initial encounter. EXAM: LEFT SHOULDER - 2+ VIEW COMPARISON:  None. FINDINGS: There is no evidence of fracture or dislocation. There is no evidence of arthropathy or other focal bone abnormality. Soft tissues are unremarkable. IMPRESSION: Negative exam. Electronically Signed   By: Inge Rise M.D.   On: 06/23/2015 14:38     Assessment & Plan:  Plan I have discontinued Ms. Sweetin's hyoscyamine. I am also having her start on hyoscyamine, azithromycin, and HYDROcodone-homatropine. Additionally, I am having her maintain her alendronate, PROAIR HFA, budesonide-formoterol, potassium chloride SA, pramipexole, feeding supplement (ENSURE COMPLETE), levothyroxine, famotidine, meloxicam, cyclobenzaprine, diclofenac, predniSONE, ferrous sulfate, fenofibrate, ZETIA, sertraline, FLUoxetine, atorvastatin, and ALPRAZolam.  Meds ordered this encounter  Medications  . hyoscyamine (LEVSIN/SL) 0.125 MG SL tablet    Sig: Place 1 tablet (0.125 mg  total) under the tongue every 4 (four) hours as needed.    Dispense:  30 tablet    Refill:  0  . azithromycin (ZITHROMAX Z-PAK) 250 MG tablet    Sig: As directed    Dispense:  6 each    Refill:  0  . HYDROcodone-homatropine (HYCODAN) 5-1.5 MG/5ML syrup    Sig: 1 po qhs prn    Dispense:  120 mL    Refill:  0    Problem List Items Addressed This Visit    None    Visit Diagnoses    IBS (irritable bowel syndrome)    -  Primary    Relevant Medications    hyoscyamine (LEVSIN/SL) 0.125 MG SL tablet    Other Relevant Orders    Ambulatory referral to Gastroenterology    Monica Abdomen Complete    CBC with Differential/Platelet (Completed)    POCT urinalysis dipstick (Completed)    Comp Met (CMET) (Completed)    Amylase (Completed)    Lipase (Completed)    Acute bronchitis, unspecified organism        Relevant Medications    azithromycin (ZITHROMAX Z-PAK) 250 MG tablet    HYDROcodone-homatropine (HYCODAN) 5-1.5 MG/5ML syrup    Abdominal pain, unspecified abdominal location        Relevant Orders    Monica Abdomen Complete    CBC with Differential/Platelet (Completed)    POCT urinalysis dipstick (Completed)    Comp Met (CMET) (Completed)    Amylase (Completed)    Lipase (Completed)       Follow-up: Return in about 6 months (around 03/20/2016), or if symptoms worsen or fail to improve.  Garnet Koyanagi, DO

## 2015-09-22 LAB — COMPREHENSIVE METABOLIC PANEL
ALT: 12 U/L (ref 0–35)
AST: 18 U/L (ref 0–37)
Albumin: 3.8 g/dL (ref 3.5–5.2)
Alkaline Phosphatase: 47 U/L (ref 39–117)
BUN: 21 mg/dL (ref 6–23)
CHLORIDE: 98 meq/L (ref 96–112)
CO2: 30 meq/L (ref 19–32)
CREATININE: 1.16 mg/dL (ref 0.40–1.20)
Calcium: 9.2 mg/dL (ref 8.4–10.5)
GFR: 48.39 mL/min — ABNORMAL LOW (ref >60.00)
Glucose, Bld: 80 mg/dL (ref 70–99)
Potassium: 4.7 mEq/L (ref 3.5–5.1)
SODIUM: 135 meq/L (ref 135–145)
Total Bilirubin: 0.5 mg/dL (ref 0.2–1.2)
Total Protein: 6.7 g/dL (ref 6.0–8.3)

## 2015-09-22 LAB — CBC WITH DIFFERENTIAL/PLATELET
BASOS ABS: 0 10*3/uL (ref 0.0–0.1)
BASOS PCT: 0.3 % (ref 0.0–3.0)
EOS ABS: 0 10*3/uL (ref 0.0–0.7)
Eosinophils Relative: 0.4 % (ref 0.0–5.0)
HCT: 44 % (ref 36.0–46.0)
HEMOGLOBIN: 14.3 g/dL (ref 12.0–15.0)
Lymphocytes Relative: 21.1 % (ref 12.0–46.0)
Lymphs Abs: 2.5 10*3/uL (ref 0.7–4.0)
MCHC: 32.4 g/dL (ref 30.0–36.0)
MCV: 91.8 fl (ref 78.0–100.0)
MONO ABS: 0.5 10*3/uL (ref 0.1–1.0)
Monocytes Relative: 4.4 % (ref 3.0–12.0)
NEUTROS ABS: 8.8 10*3/uL — AB (ref 1.4–7.7)
Neutrophils Relative %: 73.8 % (ref 43.0–77.0)
PLATELETS: 272 10*3/uL (ref 150.0–400.0)
RBC: 4.8 Mil/uL (ref 3.87–5.11)
RDW: 14.1 % (ref 11.5–15.5)
WBC: 11.9 10*3/uL — AB (ref 4.0–10.5)

## 2015-09-22 LAB — LIPASE: Lipase: 11 U/L (ref 11.0–59.0)

## 2015-09-22 LAB — AMYLASE: Amylase: 23 U/L — ABNORMAL LOW (ref 27–131)

## 2015-09-23 ENCOUNTER — Ambulatory Visit (HOSPITAL_BASED_OUTPATIENT_CLINIC_OR_DEPARTMENT_OTHER)
Admission: RE | Admit: 2015-09-23 | Discharge: 2015-09-23 | Disposition: A | Discharge status: Home / Self Care | Payer: Medicare Other | Source: Ambulatory Visit | Attending: Family Medicine | Admitting: Family Medicine

## 2015-09-23 DIAGNOSIS — K589 Irritable bowel syndrome without diarrhea: Secondary | ICD-10-CM

## 2015-09-23 DIAGNOSIS — R109 Unspecified abdominal pain: Secondary | ICD-10-CM | POA: Diagnosis not present

## 2015-09-23 DIAGNOSIS — E119 Type 2 diabetes mellitus without complications: Secondary | ICD-10-CM | POA: Insufficient documentation

## 2015-09-23 DIAGNOSIS — Z9049 Acquired absence of other specified parts of digestive tract: Secondary | ICD-10-CM | POA: Insufficient documentation

## 2015-09-23 DIAGNOSIS — R140 Abdominal distension (gaseous): Secondary | ICD-10-CM | POA: Insufficient documentation

## 2015-09-25 ENCOUNTER — Other Ambulatory Visit: Payer: Self-pay | Admitting: Internal Medicine

## 2015-09-25 ENCOUNTER — Other Ambulatory Visit: Payer: Self-pay | Admitting: Family Medicine

## 2015-09-28 NOTE — Telephone Encounter (Addendum)
Last seen 09/21/15 and filled 08/27/15 #60  Please advise     KP

## 2015-10-08 ENCOUNTER — Ambulatory Visit (INDEPENDENT_AMBULATORY_CARE_PROVIDER_SITE_OTHER): Payer: Medicare Other | Admitting: Internal Medicine

## 2015-10-08 ENCOUNTER — Encounter: Payer: Self-pay | Admitting: Internal Medicine

## 2015-10-08 VITALS — BP 124/68 | HR 64 | Ht <= 58 in | Wt 132.0 lb

## 2015-10-08 DIAGNOSIS — K591 Functional diarrhea: Secondary | ICD-10-CM | POA: Diagnosis not present

## 2015-10-08 DIAGNOSIS — Z720 Tobacco use: Secondary | ICD-10-CM

## 2015-10-08 DIAGNOSIS — R1033 Periumbilical pain: Secondary | ICD-10-CM | POA: Diagnosis not present

## 2015-10-08 NOTE — Progress Notes (Signed)
HISTORY OF PRESENT ILLNESS:  Monica Copeland is a 75 y.o. female with multiple medical problems as listed below and chronic tobacco abuse. She is referred today by her primary care physician with a chief complaint of abdominal pain and loose stools. She has a history of Candida esophagitis, GERD, ulcer disease, and remote sigmoid resection for diverticular disease. She was evaluated in August 2014 for iron deficiency anemia. Colonoscopy was unremarkable. Upper endoscopy revealed nonbleeding proximal gastric AVM. Duodenal biopsies were negative. Current complaint is 3 months of mid abdominal pain which fluctuates in intensity. Can be severe at times. No associated symptoms such as nausea, vomiting, fever, or change in bowel habits. She cannot identify any factors which either exacerbate or relieve the pain. Specifically, no effect from meals, activity, or defecation. She did undergo evaluation with her primary provider in late January. I have reviewed blood work and imaging. Abdominal ultrasound 09/23/2015 was unremarkable with some limitation due to bowel gas. No gallstones. Laboratories including comprehensive metabolic panel with amylase and lipase as well as CBC with differential were unremarkable. She did have a CT scan of the abdomen and pelvis November 2015 for left lower quadrant pain. This revealed small abdominal wall hernia. She stays on famotidine. She denies reflux symptoms or dysphagia. In terms of her bowels, intermittent loose stools. Occasional urgency. She does take Levsin as needed. This helps. No bleeding or weight loss. She actually had some weight gain over the past year.  REVIEW OF SYSTEMS:  All non-GI ROS negative except for back pain, insomnia  Past Medical History  Diagnosis Date  . Hypertension   . Hyperlipemia   . Asthma   . Diabetes mellitus without complication ([place])   . Asthma   . Back pain   . Seizures ([place])   . Anxiety   . 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 GERD  (gastroesophageal reflux disease) 01/24/2012  . Emphysema of lung ([place])   . Hyperlipidemia   . Ulcer   . Hemorrhoid   . IBS (irritable bowel syndrome)   . Iron deficiency anemia   . Gastric AVM     Past Surgical History  Procedure Laterality Date  . 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 Colon surgery      colon perforation  . Appendectomy    . Candida esophagitis    . Pylorus ulcer    . [place]    Social History Monica Copeland  reports that she has been smoking Cigarettes.  She has been smoking about 0.75 packs per day. She has never used smokeless tobacco. She reports that she does not drink alcohol or use illicit drugs.  family history includes Asthma in her father and son; Diabetes in her father; Esophageal cancer in her brother; Heart attack in her mother; Hypertension in her brother and mother; Liver cancer in her brother; Lung cancer in her father. There is no history of Colon cancer.  Allergies  Allergen Reactions  . Tomato Rash       PHYSICAL EXAMINATION: Vital signs: BP 124/68 mmHg  Pulse 64  Ht 4\' 9"  (1.448 m)  Wt 132 lb (59.875 kg)  BMI 28.56 kg/m2  Constitutional: generally well-appearing, no acute distress Psychiatric: alert and oriented x3, cooperative Eyes: extraocular movements intact, anicteric, conjunctiva pink Mouth: oral pharynx moist, no lesions Neck: supple no lymphadenopathy Cardiovascular: heart regular rate and rhythm, no murmur Lungs: clear to auscultation bilaterally Abdomen: soft, and there in  the midportion over her abdominal incision. Possible hernia defect but nondistended, no obvious ascites, no peritoneal signs, normal bowel sounds, no organomegaly Rectal: Omitted Extremities: no clubbing cyanosis or lower extremity edema bilaterally Skin: no lesions on visible extremities Neuro: No focal deficits. Normal DTRs. No asterixis.   ASSESSMENT:  #1. 3 month history of mid abdominal pain. May be  secondary to abdominal wall hernia. Rule out ulcer disease #2. Intermittent loose stools consistent with known IBS #3. GERD. Controlled with famotidine #4. History of iron deficiency anemia with gastric AVM on EGD. Last colonoscopy unremarkable post sigmoid colon resection for diverticular disease #5. Tobacco abuse.  PLAN:  #1. Schedule upper endoscopy to evaluate abdominal pain.The nature of the procedure, as well as the risks, benefits, and alternatives were carefully and thoroughly reviewed with the patient. Ample time for discussion and questions allowed. The patient understood, was satisfied, and agreed to proceed. #2. Schedule contrast-enhanced CT scan of the abdomen and pelvis to evaluate abdominal pain and incisional hernia. Rule out complication #3. Stop smoking. Advised

## 2015-10-08 NOTE — Patient Instructions (Signed)
You have been scheduled for a CT scan of the abdomen and pelvis at [facility] ([address]---this is in the same building as [facility]).   You are scheduled on 2/13/2017at 4:00am. You should arrive 15 minutes prior to your appointment time for registration. Please follow the written instructions below on the day of your exam:  WARNING: IF YOU ARE ALLERGIC TO IODINE/X-RAY DYE, PLEASE NOTIFY RADIOLOGY IMMEDIATELY AT [phone]! YOU WILL BE GIVEN A 13 HOUR PREMEDICATION PREP.  1) Do not eat or drink anything after 12:00pm (4 hours prior to your test) 2) You have been given 2 bottles of oral contrast to drink. The solution may taste better if refrigerated, but do NOT add ice or any other liquid to this solution. Shake well before drinking.    Drink 1 bottle of contrast @ 2:00pm (2 hours prior to your exam)  Drink 1 bottle of contrast @ 3:00pm (1 hour prior to your exam)  You may take any medications as prescribed with a small amount of water except for the following: Metformin, Glucophage, Glucovance, Avandamet, Riomet, Fortamet, Actoplus Met, Janumet, Glumetza or Metaglip. The above medications must be held the day of the exam AND 48 hours after the exam.  The purpose of you drinking the oral contrast is to aid in the visualization of your intestinal tract. The contrast solution may cause some diarrhea. Before your exam is started, you will be given a small amount of fluid to drink. Depending on your individual set of symptoms, you may also receive an intravenous injection of x-ray contrast/dye. Plan on being at [facility] for 30 minutes or long, depending on the type of exam you are having performed.     If you have any questions regarding your exam or if you need to reschedule, you may call the CT department at [phone] between the hours of 8:00 am and 5:00 pm, Monday-Friday.   You have been scheduled for an endoscopy. Please follow written instructions  given to you at your visit today. If you use inhalers (even only as needed), please bring them with you on the day of your procedure.

## 2015-10-11 ENCOUNTER — Other Ambulatory Visit: Payer: Self-pay | Admitting: Endocrinology

## 2015-10-11 ENCOUNTER — Ambulatory Visit (INDEPENDENT_AMBULATORY_CARE_PROVIDER_SITE_OTHER)
Admission: RE | Admit: 2015-10-11 | Discharge: 2015-10-11 | Disposition: A | Discharge status: Home / Self Care | Payer: Medicare Other | Source: Ambulatory Visit | Attending: Internal Medicine | Admitting: Internal Medicine

## 2015-10-11 DIAGNOSIS — R1033 Periumbilical pain: Secondary | ICD-10-CM

## 2015-10-11 DIAGNOSIS — K591 Functional diarrhea: Secondary | ICD-10-CM

## 2015-10-11 DIAGNOSIS — K439 Ventral hernia without obstruction or gangrene: Secondary | ICD-10-CM | POA: Diagnosis not present

## 2015-10-11 DIAGNOSIS — Z720 Tobacco use: Secondary | ICD-10-CM | POA: Diagnosis not present

## 2015-10-11 MED ORDER — IOHEXOL 300 MG/ML  SOLN
100.0000 mL | Freq: Once | INTRAMUSCULAR | Status: AC | PRN
  Administered 2015-10-11: 100 mL via INTRAVENOUS

## 2015-10-12 ENCOUNTER — Ambulatory Visit (INDEPENDENT_AMBULATORY_CARE_PROVIDER_SITE_OTHER): Payer: Medicare Other | Admitting: Endocrinology

## 2015-10-12 ENCOUNTER — Ambulatory Visit: Payer: Self-pay | Admitting: Endocrinology

## 2015-10-12 ENCOUNTER — Encounter: Payer: Self-pay | Admitting: Endocrinology

## 2015-10-12 ENCOUNTER — Telehealth: Payer: Self-pay | Admitting: Internal Medicine

## 2015-10-12 VITALS — BP 127/60 | HR 78 | Temp 97.6°F | Ht <= 58 in | Wt 130.0 lb

## 2015-10-12 DIAGNOSIS — E230 Hypopituitarism: Secondary | ICD-10-CM | POA: Diagnosis not present

## 2015-10-12 MED ORDER — PREDNISONE 5 MG PO TABS: 5.0000 mg | ORAL_TABLET | Freq: Every day | ORAL | Status: AC

## 2015-10-12 NOTE — Patient Instructions (Signed)
Thyroid blood tests are requested for you today.  We'll let you know about the results.  Please continue the same prednisone.  Please come back for a follow-up appointment in 6 months.

## 2015-10-12 NOTE — Telephone Encounter (Signed)
A user error has taken place.

## 2015-10-12 NOTE — Progress Notes (Signed)
Subjective:    Patient ID: Monica Copeland, female    DOB: 1941/04/06, 75 y.o.   MRN: [id number]  HPI Pt returns for f/u of chronic pituitary insufficiency (pt says she was dx'ed with Cushing's disease in approx 1984; she had transsphenoidal resection of a pituitary tumor then; she has been on glucocorticoid supplementation since then; she is also on synthroid; LH was only 4; prolactin was normal in 2014; she has no h/o bony fracture.  Last MRI of the brain was in 2013, and made no mention of the pituitary).  denies headache.  Med list shows synthroid is only 25 mcg/day.   Past Medical History  Diagnosis Date  . Hypertension   . Hyperlipemia   . Asthma   . Diabetes mellitus without complication ([place])   . Asthma   . Back pain   . Seizures ([place])   . Anxiety   . 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 GERD (gastroesophageal reflux disease) 01/24/2012  . Emphysema of lung ([place])   . Hyperlipidemia   . Ulcer   . Hemorrhoid   . IBS (irritable bowel syndrome)   . Iron deficiency anemia   . Gastric AVM     Past Surgical History  Procedure Laterality Date  . 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 Colon surgery      colon perforation  . Appendectomy    . Candida esophagitis    . Pylorus ulcer    . [place]dominal [place]      [place]    Social History   Social History  . Marital Status: Widowed    Spouse Name: N/A  . Number of Children: N/A  . Years of Education: N/A   Occupational History  . Not on file.   Social History Main Topics  . Smoking status: Current Every Day Smoker -- 0.75 packs/day    Types: Cigarettes  . Smokeless tobacco: Never Used     Comment: Started smoking at age 50.form given 04-02-13  . Alcohol Use: No  . Drug Use: No  . Sexual Activity: Not on file   Other Topics Concern  . Not on file   Social History Narrative   ** Merged History Encounter **        Current Outpatient Prescriptions on File Prior to Visit  Medication Sig Dispense Refill  .  ALPRAZolam (XANAX) 0.5 MG tablet TAKE 1/2 TO 1 TABLET BY MOUTH 2 TIMES A DAY AS NEEDED FOR ANXIETY. 60 tablet 0  . atorvastatin (LIPITOR) 40 MG tablet TAKE 1 TABLET BY MOUTH ONCE DAILY 30 tablet 2  . budesonide-formoterol (SYMBICORT) 80-4.5 MCG/ACT inhaler Inhale 2 puffs into the lungs as needed.    . famotidine (PEPCID) 20 MG tablet Take 1 tablet (20 mg total) by mouth 2 (two) times daily. 60 tablet 0  . feeding supplement, ENSURE COMPLETE, (ENSURE COMPLETE) LIQD Take 237 mLs by mouth 2 (two) times daily between meals. 237 mL 0  . fenofibrate 160 MG tablet TAKE 1 TABLET BY MOUTH ONCE DAILY 30 tablet 4  . ferrous sulfate 325 (65 FE) MG tablet TAKE 1 TABLET BY MOUTH TWICE A DAY BEFORE LUNCH AND SUPPER 60 tablet 5  . FLUoxetine (PROZAC) 20 MG capsule TAKE 1 CAPSULE BY MOUTH ONCE DAILY 30 capsule 5  . hyoscyamine (LEVSIN SL) 0.125 MG SL tablet PLACE 1 TABLET UNDER THE TONGUE EVERY 4 HOURS AS NEEDED. 30 tablet 0  . levothyroxine (SYNTHROID, LEVOTHROID) 25 MCG tablet Take 1 tablet (25  mcg total) by mouth daily before breakfast. 30 tablet 0  . potassium chloride SA (K-DUR,KLOR-CON) 20 MEQ tablet Take 1 tablet (20 mEq total) by mouth daily. 30 tablet 2  . PROAIR HFA 108 (90 BASE) MCG/ACT inhaler Inhale 1-2 puffs into the lungs every 6 (six) hours as needed for wheezing or shortness of breath.     Marland Kitchen ZETIA 10 MG tablet TAKE 1 TABLET BY MOUTH DAILY 30 tablet 2   No current facility-administered medications on file prior to visit.    Allergies  Allergen Reactions  . Tomato Rash    Family History  Problem Relation Age of Onset  . Hypertension Mother   . Hypertension Brother   . Heart attack Mother   . Diabetes Father   . Asthma Father   . Asthma Son   . Lung cancer Father   . Esophageal cancer Brother   . Liver cancer Brother     mets from esophagus  . Colon cancer Neg Hx     BP 127/60 mmHg  Pulse 78  Temp(Src) 97.6 F (36.4 C) (Oral)  Ht 4\' 9"  (1.448 m)  Wt 130 lb (58.968 kg)  BMI  28.12 kg/m2  SpO2 93%  Review of Systems Denies edema and dizziness.      Objective:   Physical Exam VITAL SIGNS:  See vs page GENERAL: no distress Ext: no edema.  Lab Results  Component Value Date   CREATININE 1.16 09/21/2015   BUN 21 09/21/2015   NA 135 09/21/2015   K 4.7 09/21/2015   CL 98 09/21/2015   CO2 30 09/21/2015      Assessment & Plan:  Pituitary insufficiency: HPA insufficiency is clinically well-controlled.  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    Patient is advised the following: Patient Instructions  Thyroid blood tests are requested for you today.  We'll let you know about the results.  Please continue the same prednisone.  Please come back for a follow-up appointment in 6 months.

## 2015-10-13 ENCOUNTER — Ambulatory Visit (AMBULATORY_SURGERY_CENTER): Payer: Medicare Other | Admitting: Internal Medicine

## 2015-10-13 ENCOUNTER — Encounter: Payer: Self-pay | Admitting: Internal Medicine

## 2015-10-13 VITALS — BP 121/70 | HR 65 | Temp 97.1°F | Resp 18 | Ht <= 58 in | Wt 132.0 lb

## 2015-10-13 DIAGNOSIS — K2100 Gastro-esophageal reflux disease with esophagitis, without bleeding: Secondary | ICD-10-CM

## 2015-10-13 DIAGNOSIS — R1033 Periumbilical pain: Secondary | ICD-10-CM | POA: Diagnosis not present

## 2015-10-13 MED ORDER — SODIUM CHLORIDE 0.9 % IV SOLN: 500.0000 mL | INTRAVENOUS | Status: DC

## 2015-10-13 MED ORDER — OMEPRAZOLE 20 MG PO CPDR: 20.0000 mg | DELAYED_RELEASE_CAPSULE | Freq: Every day | ORAL | Status: DC

## 2015-10-13 NOTE — Op Note (Signed)
[facility]  [address] [place], [address]   ENDOSCOPY PROCEDURE REPORT  PATIENT: Monica, Copeland  MR#: [id number] BIRTHDATE: [birth year] , 34  yrs. old GENDER: female ENDOSCOPIST: Eustace Quail, MD REFERRED BY:  .  Self / Office PROCEDURE DATE:  10/13/2015 PROCEDURE:  EGD, diagnostic ASA CLASS:     Class II INDICATIONS:  abdominal pain, mid abdomen. Has ventral hernia. Recent CT otherwise negative. MEDICATIONS: Monitored anesthesia care and Propofol 150 mg IV TOPICAL ANESTHETIC: none  DESCRIPTION OF PROCEDURE: After the risks benefits and alternatives of the procedure were thoroughly explained, informed consent was obtained.  The [id number] endoscope was introduced through the mouth and advanced to the second portion of the duodenum , Without limitations.  The instrument was slowly withdrawn as the mucosa was fully examined.   EXAM:The esophagus revealed mild distal esophagitis as manifested by diminutive erosions at the gastroesophageal junction.  The stomach was normal.  The duodenum was normal.  Retroflexed views revealed no abnormalities.     The scope was then withdrawn from the patient and the procedure completed.  COMPLICATIONS: There were no immediate complications.  ENDOSCOPIC IMPRESSION: 1. GERD with endoscopic evidence of esophagitis 2. Ventral hernia without complicating features on CT. May cause abdominal discomfort, however.  RECOMMENDATIONS: 1.  Anti-reflux regimen to be followed 2.  Prescribe omeprazole 20 mg daily; #30; 11 refills 3. If abdominal discomfort persists despite omeprazole therapy, recommend surgical opinion regarding ventral hernia as cause for discomfort and potential need for repair  REPEAT EXAM:  eSigned:  Eustace Quail, MD 10/13/2015 10:34 AM    CC:The Patient and Rosalita Chessman, DO

## 2015-10-13 NOTE — Progress Notes (Signed)
To Pacu  Pt awake and ALert  Report to RN

## 2015-10-13 NOTE — Patient Instructions (Signed)
Discharge instructions given. Handout on esophagitis. Resume previous medications. YOU HAD AN ENDOSCOPIC PROCEDURE TODAY AT [facility]:   Refer to the procedure report that was given to you for any specific questions about what was found during the examination.  If the procedure report does not answer your questions, please call your gastroenterologist to clarify.  If you requested that your care partner not be given the details of your procedure findings, then the procedure report has been included in a sealed envelope for you to review at your convenience later.  YOU SHOULD EXPECT: Some feelings of bloating in the abdomen. Passage of more gas than usual.  Walking can help get rid of the air that was put into your GI tract during the procedure and reduce the bloating. If you had a lower endoscopy (such as a colonoscopy or flexible sigmoidoscopy) you may notice spotting of blood in your stool or on the toilet paper. If you underwent a bowel prep for your procedure, you may not have a normal bowel movement for a few days.  Please Note:  You might notice some irritation and congestion in your nose or some drainage.  This is from the oxygen used during your procedure.  There is no need for concern and it should clear up in a day or so.  SYMPTOMS TO REPORT IMMEDIATELY:    Following upper endoscopy (EGD)  Vomiting of blood or coffee ground material  New chest pain or pain under the shoulder blades  Painful or persistently difficult swallowing  New shortness of breath  Fever of 100F or higher  Black, tarry-looking stools  For urgent or emergent issues, a gastroenterologist can be reached at any hour by calling [phone].   DIET: Your first meal following the procedure should be a small meal and then it is ok to progress to your normal diet. Heavy or fried foods are harder to digest and may make you feel nauseous or bloated.  Likewise, meals heavy in dairy and vegetables can  increase bloating.  Drink plenty of fluids but you should avoid alcoholic beverages for 24 hours.  ACTIVITY:  You should plan to take it easy for the rest of today and you should NOT DRIVE or use heavy machinery until tomorrow (because of the sedation medicines used during the test).    FOLLOW UP: Our staff will call the number listed on your records the next business day following your procedure to check on you and address any questions or concerns that you may have regarding the information given to you following your procedure. If we do not reach you, we will leave a message.  However, if you are feeling well and you are not experiencing any problems, there is no need to return our call.  We will assume that you have returned to your regular daily activities without incident.  If any biopsies were taken you will be contacted by phone or by letter within the next 1-3 weeks.  Please call us at [phone] if you have not heard about the biopsies in 3 weeks.    SIGNATURES/CONFIDENTIALITY: You and/or your care partner have signed paperwork which will be entered into your electronic medical record.  These signatures attest to the fact that that the information above on your After Visit Summary has been reviewed and is understood.  Full responsibility of the confidentiality of this discharge information lies with you and/or your care-partner.

## 2015-10-14 ENCOUNTER — Telehealth: Payer: Self-pay

## 2015-10-14 NOTE — Telephone Encounter (Signed)
Left a message at [phone] for the pt to call us back if any questions or concerns. maw

## 2015-10-16 ENCOUNTER — Other Ambulatory Visit: Payer: Self-pay | Admitting: Family Medicine

## 2015-10-18 NOTE — Telephone Encounter (Signed)
Last seen 09/21/15 and filled 09/28/15 #60   Please advise   KP

## 2015-11-02 ENCOUNTER — Telehealth: Payer: Self-pay

## 2015-11-02 MED ORDER — ALPRAZOLAM 0.5 MG PO TABS: ORAL_TABLET | ORAL | Status: DC

## 2015-11-02 NOTE — Telephone Encounter (Signed)
Rx faxed.    KP 

## 2015-11-02 NOTE — Telephone Encounter (Signed)
Refill x1 

## 2015-11-02 NOTE — Telephone Encounter (Signed)
Last seen 09/21/15 and filled 10/18/15 #60    Please advise     KP

## 2015-11-05 ENCOUNTER — Telehealth: Payer: Self-pay | Admitting: *Deleted

## 2015-11-05 NOTE — Telephone Encounter (Signed)
Received Statement of Medical Necessity for order; forwarded to provider/SLS 03/10

## 2015-11-10 DIAGNOSIS — E249 Cushing's syndrome, unspecified: Secondary | ICD-10-CM | POA: Diagnosis not present

## 2015-11-10 DIAGNOSIS — R05 Cough: Secondary | ICD-10-CM | POA: Diagnosis not present

## 2015-11-10 DIAGNOSIS — G8929 Other chronic pain: Secondary | ICD-10-CM | POA: Diagnosis not present

## 2015-11-10 DIAGNOSIS — J45909 Unspecified asthma, uncomplicated: Secondary | ICD-10-CM | POA: Diagnosis not present

## 2015-12-03 ENCOUNTER — Telehealth: Payer: Self-pay | Admitting: Family Medicine

## 2015-12-03 DIAGNOSIS — M25561 Pain in right knee: Secondary | ICD-10-CM

## 2015-12-03 NOTE — Telephone Encounter (Signed)
Ref placed.      KP 

## 2015-12-03 NOTE — Telephone Encounter (Signed)
Pt says that she is still having knee pain. Pt says that she has had a referral to [facility] in [place] before. Pt would like to know if PCP could refer her back to the same location if possible.    CB: [phone]

## 2015-12-04 ENCOUNTER — Other Ambulatory Visit: Payer: Self-pay | Admitting: Family Medicine

## 2015-12-04 DIAGNOSIS — M25561 Pain in right knee: Secondary | ICD-10-CM

## 2015-12-04 NOTE — Telephone Encounter (Signed)
Referral in

## 2015-12-09 DIAGNOSIS — M1711 Unilateral primary osteoarthritis, right knee: Secondary | ICD-10-CM | POA: Diagnosis not present

## 2015-12-16 ENCOUNTER — Other Ambulatory Visit: Payer: Self-pay | Admitting: Family Medicine

## 2015-12-16 NOTE — Telephone Encounter (Signed)
Last seen 09/21/15 and filled 11/02/15 #60   Please advise    KP

## 2015-12-27 ENCOUNTER — Telehealth: Payer: Self-pay | Admitting: *Deleted

## 2015-12-27 NOTE — Telephone Encounter (Signed)
Pt has been scheduled.  °

## 2015-12-27 NOTE — Telephone Encounter (Signed)
Pt has surgery scheduled for 01/17/16 for RTK replacement. Please call pt and schedule surgical clearance appt with Dr. Carollee Herter. Thanks, JG//CMA

## 2015-12-27 NOTE — Telephone Encounter (Signed)
LVM advising patient to call and schedule 30 minute surgical clearance appointment

## 2016-01-05 ENCOUNTER — Encounter (HOSPITAL_COMMUNITY)
Admission: EM | Disposition: A | Discharge status: Home Health | Payer: Self-pay | Source: Home / Self Care | Attending: Cardiovascular Disease

## 2016-01-05 ENCOUNTER — Ambulatory Visit (HOSPITAL_COMMUNITY): Admit: 2016-01-05 | Payer: Self-pay | Admitting: Cardiovascular Disease

## 2016-01-05 ENCOUNTER — Inpatient Hospital Stay (HOSPITAL_COMMUNITY): Payer: Medicare Other

## 2016-01-05 ENCOUNTER — Encounter (HOSPITAL_COMMUNITY): Payer: Self-pay | Admitting: Cardiology

## 2016-01-05 ENCOUNTER — Inpatient Hospital Stay (HOSPITAL_COMMUNITY)
Admission: EM | Admit: 2016-01-05 | Discharge: 2016-01-08 | DRG: 246 | Disposition: A | Discharge status: Home Health | Payer: Medicare Other | Attending: Cardiovascular Disease | Admitting: Cardiovascular Disease

## 2016-01-05 DIAGNOSIS — F17210 Nicotine dependence, cigarettes, uncomplicated: Secondary | ICD-10-CM | POA: Diagnosis present

## 2016-01-05 DIAGNOSIS — R079 Chest pain, unspecified: Secondary | ICD-10-CM

## 2016-01-05 DIAGNOSIS — F17200 Nicotine dependence, unspecified, uncomplicated: Secondary | ICD-10-CM | POA: Diagnosis present

## 2016-01-05 DIAGNOSIS — I4901 Ventricular fibrillation: Secondary | ICD-10-CM | POA: Diagnosis not present

## 2016-01-05 DIAGNOSIS — K219 Gastro-esophageal reflux disease without esophagitis: Secondary | ICD-10-CM | POA: Diagnosis not present

## 2016-01-05 DIAGNOSIS — E785 Hyperlipidemia, unspecified: Secondary | ICD-10-CM | POA: Diagnosis not present

## 2016-01-05 DIAGNOSIS — I2111 ST elevation (STEMI) myocardial infarction involving right coronary artery: Secondary | ICD-10-CM | POA: Diagnosis not present

## 2016-01-05 DIAGNOSIS — R001 Bradycardia, unspecified: Secondary | ICD-10-CM | POA: Diagnosis not present

## 2016-01-05 DIAGNOSIS — I213 ST elevation (STEMI) myocardial infarction of unspecified site: Secondary | ICD-10-CM | POA: Diagnosis not present

## 2016-01-05 DIAGNOSIS — I2119 ST elevation (STEMI) myocardial infarction involving other coronary artery of inferior wall: Principal | ICD-10-CM | POA: Diagnosis present

## 2016-01-05 DIAGNOSIS — I10 Essential (primary) hypertension: Secondary | ICD-10-CM | POA: Diagnosis not present

## 2016-01-05 DIAGNOSIS — R0602 Shortness of breath: Secondary | ICD-10-CM | POA: Diagnosis not present

## 2016-01-05 DIAGNOSIS — E249 Cushing's syndrome, unspecified: Secondary | ICD-10-CM | POA: Diagnosis not present

## 2016-01-05 DIAGNOSIS — Z7952 Long term (current) use of systemic steroids: Secondary | ICD-10-CM

## 2016-01-05 DIAGNOSIS — I959 Hypotension, unspecified: Secondary | ICD-10-CM | POA: Diagnosis not present

## 2016-01-05 DIAGNOSIS — M25569 Pain in unspecified knee: Secondary | ICD-10-CM | POA: Diagnosis present

## 2016-01-05 DIAGNOSIS — Z955 Presence of coronary angioplasty implant and graft: Secondary | ICD-10-CM

## 2016-01-05 DIAGNOSIS — E119 Type 2 diabetes mellitus without complications: Secondary | ICD-10-CM | POA: Diagnosis present

## 2016-01-05 DIAGNOSIS — J449 Chronic obstructive pulmonary disease, unspecified: Secondary | ICD-10-CM | POA: Diagnosis present

## 2016-01-05 DIAGNOSIS — I2510 Atherosclerotic heart disease of native coronary artery without angina pectoris: Secondary | ICD-10-CM | POA: Diagnosis not present

## 2016-01-05 HISTORY — PX: CARDIAC CATHETERIZATION: SHX172

## 2016-01-05 LAB — COMPREHENSIVE METABOLIC PANEL
ALK PHOS: 39 U/L (ref 38–126)
ALT: 19 U/L (ref 14–54)
ANION GAP: 13 (ref 5–15)
AST: 21 U/L (ref 15–41)
Albumin: 3.1 g/dL — ABNORMAL LOW (ref 3.5–5.0)
BUN: 22 mg/dL — ABNORMAL HIGH (ref 6–20)
CALCIUM: 8.4 mg/dL — AB (ref 8.9–10.3)
CHLORIDE: 105 mmol/L (ref 101–111)
CO2: 20 mmol/L — AB (ref 22–32)
Creatinine, Ser: 1.21 mg/dL — ABNORMAL HIGH (ref 0.44–1.00)
GFR calc non Af Amer: 43 mL/min — ABNORMAL LOW (ref >60)
GFR, EST AFRICAN AMERICAN: 49 mL/min — AB (ref >60)
GLUCOSE: 137 mg/dL — AB (ref 65–99)
POTASSIUM: 3.4 mmol/L — AB (ref 3.5–5.1)
SODIUM: 138 mmol/L (ref 135–145)
Total Bilirubin: 0.5 mg/dL (ref 0.3–1.2)
Total Protein: 5.7 g/dL — ABNORMAL LOW (ref 6.5–8.1)

## 2016-01-05 LAB — POCT ACTIVATED CLOTTING TIME
ACTIVATED CLOTTING TIME: 131 s
ACTIVATED CLOTTING TIME: 173 s
Activated Clotting Time: 353 seconds
Activated Clotting Time: 935 seconds

## 2016-01-05 LAB — POCT I-STAT, CHEM 8
BUN: 23 mg/dL — ABNORMAL HIGH (ref 6–20)
Calcium, Ion: 1.15 mmol/L (ref 1.13–1.30)
Chloride: 100 mmol/L — ABNORMAL LOW (ref 101–111)
Creatinine, Ser: 1 mg/dL (ref 0.44–1.00)
Glucose, Bld: 132 mg/dL — ABNORMAL HIGH (ref 65–99)
HCT: 39 % (ref 36.0–46.0)
Hemoglobin: 13.3 g/dL (ref 12.0–15.0)
Potassium: 3.4 mmol/L — ABNORMAL LOW (ref 3.5–5.1)
Sodium: 138 mmol/L (ref 135–145)
TCO2: 23 mmol/L (ref 0–100)

## 2016-01-05 LAB — URINALYSIS, ROUTINE W REFLEX MICROSCOPIC
BILIRUBIN URINE: NEGATIVE
Glucose, UA: NEGATIVE mg/dL
Ketones, ur: NEGATIVE mg/dL
Leukocytes, UA: NEGATIVE
NITRITE: NEGATIVE
PH: 6 (ref 5.0–8.0)
Protein, ur: NEGATIVE mg/dL
SPECIFIC GRAVITY, URINE: 1.03 (ref 1.005–1.030)

## 2016-01-05 LAB — CBC
HEMATOCRIT: 32.9 % — AB (ref 36.0–46.0)
HEMATOCRIT: 29.7 % — AB (ref 36.0–46.0)
HEMOGLOBIN: 10 g/dL — AB (ref 12.0–15.0)
Hemoglobin: 10.6 g/dL — ABNORMAL LOW (ref 12.0–15.0)
MCH: 29.3 pg (ref 26.0–34.0)
MCH: 30.8 pg (ref 26.0–34.0)
MCHC: 32.2 g/dL (ref 30.0–36.0)
MCHC: 33.7 g/dL (ref 30.0–36.0)
MCV: 90.9 fL (ref 78.0–100.0)
MCV: 91.4 fL (ref 78.0–100.0)
Platelets: 261 10*3/uL (ref 150–400)
Platelets: 251 10*3/uL (ref 150–400)
RBC: 3.62 MIL/uL — ABNORMAL LOW (ref 3.87–5.11)
RBC: 3.25 MIL/uL — AB (ref 3.87–5.11)
RDW: 14.5 % (ref 11.5–15.5)
RDW: 14.7 % (ref 11.5–15.5)
WBC: 21.3 10*3/uL — ABNORMAL HIGH (ref 4.0–10.5)
WBC: 19 10*3/uL — AB (ref 4.0–10.5)

## 2016-01-05 LAB — URINE MICROSCOPIC-ADD ON: WBC UA: NONE SEEN WBC/hpf (ref 0–5)

## 2016-01-05 LAB — CBC WITH DIFFERENTIAL/PLATELET
Basophils Absolute: 0 10*3/uL (ref 0.0–0.1)
Basophils Relative: 0 %
Eosinophils Absolute: 0 10*3/uL (ref 0.0–0.7)
Eosinophils Relative: 0 %
HEMATOCRIT: 35.6 % — AB (ref 36.0–46.0)
Hemoglobin: 11.8 g/dL — ABNORMAL LOW (ref 12.0–15.0)
LYMPHS ABS: 2.1 10*3/uL (ref 0.7–4.0)
LYMPHS PCT: 22 %
MCH: 30.7 pg (ref 26.0–34.0)
MCHC: 33.1 g/dL (ref 30.0–36.0)
MCV: 92.7 fL (ref 78.0–100.0)
MONOS PCT: 6 %
Monocytes Absolute: 0.6 10*3/uL (ref 0.1–1.0)
NEUTROS ABS: 7 10*3/uL (ref 1.7–7.7)
NEUTROS PCT: 72 %
Platelets: 264 10*3/uL (ref 150–400)
RBC: 3.84 MIL/uL — AB (ref 3.87–5.11)
RDW: 14.6 % (ref 11.5–15.5)
WBC: 9.8 10*3/uL (ref 4.0–10.5)

## 2016-01-05 LAB — PROTIME-INR
INR: 1.07 (ref 0.00–1.49)
PROTHROMBIN TIME: 14.1 s (ref 11.6–15.2)

## 2016-01-05 LAB — TROPONIN I
TROPONIN I: 0.04 ng/mL — AB (ref <0.031)
TROPONIN I: 4.55 ng/mL — AB (ref <0.031)
TROPONIN I: 10.19 ng/mL — AB (ref <0.031)

## 2016-01-05 LAB — LIPID PANEL
CHOL/HDL RATIO: 4.5 ratio
Cholesterol: 152 mg/dL (ref 0–200)
HDL: 34 mg/dL — AB (ref >40)
LDL CALC: 89 mg/dL (ref 0–99)
TRIGLYCERIDES: 144 mg/dL (ref <150)
VLDL: 29 mg/dL (ref 0–40)

## 2016-01-05 LAB — MAGNESIUM: Magnesium: 1.7 mg/dL (ref 1.7–2.4)

## 2016-01-05 LAB — MRSA PCR SCREENING: MRSA by PCR: NEGATIVE

## 2016-01-05 LAB — APTT: aPTT: 29 seconds (ref 24–37)

## 2016-01-05 LAB — TSH: TSH: 1.372 u[IU]/mL (ref 0.350–4.500)

## 2016-01-05 LAB — CK: CK TOTAL: 54 U/L (ref 38–234)

## 2016-01-05 SURGERY — LEFT HEART CATH AND CORONARY ANGIOGRAPHY

## 2016-01-05 MED ORDER — PREDNISONE 10 MG PO TABS: 5.0000 mg | ORAL_TABLET | Freq: Every day | ORAL | Status: DC

## 2016-01-05 MED ORDER — PREDNISONE 20 MG PO TABS: 20.0000 mg | ORAL_TABLET | Freq: Once | ORAL | Status: DC

## 2016-01-05 MED ORDER — PREDNISONE 20 MG PO TABS: 40.0000 mg | ORAL_TABLET | Freq: Every day | ORAL | Status: DC

## 2016-01-05 MED ORDER — NITROGLYCERIN 1 MG/10 ML FOR IR/CATH LAB
INTRA_ARTERIAL | Status: AC
  Filled 2016-01-05: qty 10

## 2016-01-05 MED ORDER — MIDAZOLAM HCL 2 MG/2ML IJ SOLN
INTRAMUSCULAR | Status: AC
  Filled 2016-01-05: qty 2

## 2016-01-05 MED ORDER — PREDNISONE 10 MG PO TABS
10.0000 mg | ORAL_TABLET | Freq: Once | ORAL | Status: AC
  Administered 2016-01-07: 10 mg via ORAL
  Filled 2016-01-05: qty 1

## 2016-01-05 MED ORDER — PANTOPRAZOLE SODIUM 40 MG PO TBEC
40.0000 mg | DELAYED_RELEASE_TABLET | Freq: Every day | ORAL | Status: DC
  Administered 2016-01-06 – 2016-01-08 (×3): 40 mg via ORAL
  Filled 2016-01-05 (×3): qty 1

## 2016-01-05 MED ORDER — LEVOTHYROXINE SODIUM 25 MCG PO TABS
25.0000 ug | ORAL_TABLET | Freq: Every day | ORAL | Status: DC
  Administered 2016-01-06 – 2016-01-08 (×3): 25 ug via ORAL
  Filled 2016-01-05 (×3): qty 1

## 2016-01-05 MED ORDER — ALBUTEROL SULFATE (2.5 MG/3ML) 0.083% IN NEBU: 3.0000 mL | INHALATION_SOLUTION | Freq: Four times a day (QID) | RESPIRATORY_TRACT | Status: DC | PRN

## 2016-01-05 MED ORDER — HYDRALAZINE HCL 20 MG/ML IJ SOLN: 10.0000 mg | Freq: Once | INTRAMUSCULAR | Status: DC

## 2016-01-05 MED ORDER — TICAGRELOR 90 MG PO TABS
ORAL_TABLET | ORAL | Status: AC
  Filled 2016-01-05: qty 1

## 2016-01-05 MED ORDER — IOPAMIDOL (ISOVUE-370) INJECTION 76%
INTRAVENOUS | Status: AC
  Filled 2016-01-05: qty 125

## 2016-01-05 MED ORDER — HEPARIN (PORCINE) IN NACL 2-0.9 UNIT/ML-% IJ SOLN
INTRAMUSCULAR | Status: AC
  Filled 2016-01-05: qty 1000

## 2016-01-05 MED ORDER — LIDOCAINE HCL (PF) 1 % IJ SOLN
INTRAMUSCULAR | Status: AC
  Filled 2016-01-05: qty 30

## 2016-01-05 MED ORDER — ENSURE COMPLETE PO LIQD: 237.0000 mL | Freq: Two times a day (BID) | ORAL | Status: DC

## 2016-01-05 MED ORDER — TICAGRELOR 90 MG PO TABS
90.0000 mg | ORAL_TABLET | Freq: Two times a day (BID) | ORAL | Status: DC
  Administered 2016-01-05 – 2016-01-08 (×6): 90 mg via ORAL
  Filled 2016-01-05 (×6): qty 1

## 2016-01-05 MED ORDER — TICAGRELOR 90 MG PO TABS
ORAL_TABLET | ORAL | Status: DC | PRN
  Administered 2016-01-05: 180 mg via ORAL

## 2016-01-05 MED ORDER — ACETAMINOPHEN 325 MG PO TABS: 650.0000 mg | ORAL_TABLET | ORAL | Status: DC | PRN

## 2016-01-05 MED ORDER — FLUOXETINE HCL 20 MG PO CAPS
20.0000 mg | ORAL_CAPSULE | Freq: Every day | ORAL | Status: DC
  Administered 2016-01-06 – 2016-01-08 (×3): 20 mg via ORAL
  Filled 2016-01-05 (×3): qty 1

## 2016-01-05 MED ORDER — ALPRAZOLAM 0.5 MG PO TABS
0.5000 mg | ORAL_TABLET | Freq: Two times a day (BID) | ORAL | Status: DC | PRN
  Administered 2016-01-06 – 2016-01-07 (×3): 0.5 mg via ORAL
  Filled 2016-01-05 (×3): qty 1

## 2016-01-05 MED ORDER — SODIUM CHLORIDE 0.9% FLUSH: 3.0000 mL | INTRAVENOUS | Status: DC | PRN

## 2016-01-05 MED ORDER — TIROFIBAN HCL IV 12.5 MG/250 ML
0.0750 ug/kg/min | INTRAVENOUS | Status: AC
  Administered 2016-01-05 (×2): 0.075 ug/kg/min via INTRAVENOUS
  Filled 2016-01-05: qty 250

## 2016-01-05 MED ORDER — FERROUS SULFATE 325 (65 FE) MG PO TABS
325.0000 mg | ORAL_TABLET | Freq: Every day | ORAL | Status: DC
Start: 2016-01-06 — End: 2016-01-08
  Administered 2016-01-06 – 2016-01-08 (×3): 325 mg via ORAL
  Filled 2016-01-05 (×3): qty 1

## 2016-01-05 MED ORDER — PREDNISONE 20 MG PO TABS
40.0000 mg | ORAL_TABLET | Freq: Once | ORAL | Status: AC
  Administered 2016-01-05: 40 mg via ORAL
  Filled 2016-01-05: qty 2

## 2016-01-05 MED ORDER — DIAZEPAM 5 MG PO TABS
5.0000 mg | ORAL_TABLET | Freq: Four times a day (QID) | ORAL | Status: DC | PRN
  Administered 2016-01-05 – 2016-01-06 (×2): 5 mg via ORAL
  Filled 2016-01-05 (×2): qty 1

## 2016-01-05 MED ORDER — MOMETASONE FURO-FORMOTEROL FUM 100-5 MCG/ACT IN AERO
2.0000 | INHALATION_SPRAY | Freq: Two times a day (BID) | RESPIRATORY_TRACT | Status: DC
  Administered 2016-01-05 – 2016-01-08 (×6): 2 via RESPIRATORY_TRACT
  Filled 2016-01-05: qty 8.8

## 2016-01-05 MED ORDER — FENTANYL CITRATE (PF) 100 MCG/2ML IJ SOLN
INTRAMUSCULAR | Status: DC | PRN
  Administered 2016-01-05: 25 ug via INTRAVENOUS

## 2016-01-05 MED ORDER — SODIUM CHLORIDE 0.9 % IV SOLN
0.2500 mg/kg/h | INTRAVENOUS | Status: AC
  Administered 2016-01-05: 0.25 mg/kg/h via INTRAVENOUS
  Filled 2016-01-05: qty 250

## 2016-01-05 MED ORDER — TIROFIBAN HCL IN NACL 5-0.9 MG/100ML-% IV SOLN
INTRAVENOUS | Status: AC
  Filled 2016-01-05: qty 100

## 2016-01-05 MED ORDER — PREDNISONE 10 MG PO TABS
5.0000 mg | ORAL_TABLET | Freq: Every day | ORAL | Status: DC
  Administered 2016-01-08: 5 mg via ORAL
  Filled 2016-01-05: qty 1

## 2016-01-05 MED ORDER — SODIUM CHLORIDE 0.9 % IV SOLN: 250.0000 mL | INTRAVENOUS | Status: DC | PRN

## 2016-01-05 MED ORDER — MIDAZOLAM HCL 2 MG/2ML IJ SOLN
INTRAMUSCULAR | Status: DC | PRN
  Administered 2016-01-05 (×2): 1 mg via INTRAVENOUS

## 2016-01-05 MED ORDER — ASPIRIN EC 81 MG PO TBEC
81.0000 mg | DELAYED_RELEASE_TABLET | Freq: Every day | ORAL | Status: DC
  Administered 2016-01-06 – 2016-01-08 (×3): 81 mg via ORAL
  Filled 2016-01-05 (×3): qty 1

## 2016-01-05 MED ORDER — ATORVASTATIN CALCIUM 80 MG PO TABS
80.0000 mg | ORAL_TABLET | Freq: Every day | ORAL | Status: DC
  Administered 2016-01-05 – 2016-01-07 (×3): 80 mg via ORAL
  Filled 2016-01-05 (×3): qty 1

## 2016-01-05 MED ORDER — PREDNISONE 20 MG PO TABS
20.0000 mg | ORAL_TABLET | Freq: Once | ORAL | Status: AC
  Administered 2016-01-06: 20 mg via ORAL
  Filled 2016-01-05: qty 1

## 2016-01-05 MED ORDER — IOPAMIDOL (ISOVUE-370) INJECTION 76%
INTRAVENOUS | Status: DC | PRN
  Administered 2016-01-05: 210 mL via INTRAVENOUS

## 2016-01-05 MED ORDER — IOPAMIDOL (ISOVUE-370) INJECTION 76%
INTRAVENOUS | Status: AC
  Filled 2016-01-05: qty 100

## 2016-01-05 MED ORDER — NITROGLYCERIN 1 MG/10 ML FOR IR/CATH LAB
INTRA_ARTERIAL | Status: DC | PRN
  Administered 2016-01-05: 200 ug via INTRACORONARY

## 2016-01-05 MED ORDER — ONDANSETRON HCL 4 MG/2ML IJ SOLN: 4.0000 mg | Freq: Four times a day (QID) | INTRAMUSCULAR | Status: DC | PRN

## 2016-01-05 MED ORDER — LIDOCAINE HCL (PF) 1 % IJ SOLN
INTRAMUSCULAR | Status: DC | PRN
  Administered 2016-01-05: 20 mL

## 2016-01-05 MED ORDER — SODIUM CHLORIDE 0.9 % IV SOLN
INTRAVENOUS | Status: AC
  Administered 2016-01-05: 14:00:00 via INTRAVENOUS

## 2016-01-05 MED ORDER — TIROFIBAN HCL IN NACL 5-0.9 MG/100ML-% IV SOLN
INTRAVENOUS | Status: DC | PRN
  Administered 2016-01-05: 0.15 ug/kg/min via INTRAVENOUS

## 2016-01-05 MED ORDER — TIROFIBAN (AGGRASTAT) BOLUS VIA INFUSION
INTRAVENOUS | Status: DC | PRN
  Administered 2016-01-05: 1475 ug via INTRAVENOUS

## 2016-01-05 MED ORDER — BIVALIRUDIN BOLUS VIA INFUSION - CUPID
INTRAVENOUS | Status: DC | PRN
  Administered 2016-01-05: 44.25 mg via INTRAVENOUS

## 2016-01-05 MED ORDER — HEPARIN (PORCINE) IN NACL 2-0.9 UNIT/ML-% IJ SOLN
INTRAMUSCULAR | Status: DC | PRN
  Administered 2016-01-05: 1000 mL

## 2016-01-05 MED ORDER — SODIUM CHLORIDE 0.9 % IV SOLN
INTRAVENOUS | Status: DC | PRN
  Administered 2016-01-05: 1 mL
  Administered 2016-01-05: 10 mL/h via INTRAVENOUS

## 2016-01-05 MED ORDER — SODIUM CHLORIDE 0.9% FLUSH
3.0000 mL | Freq: Two times a day (BID) | INTRAVENOUS | Status: DC
  Administered 2016-01-05 – 2016-01-07 (×5): 3 mL via INTRAVENOUS

## 2016-01-05 MED ORDER — SODIUM CHLORIDE 0.9 % IV SOLN
250.0000 mg | INTRAVENOUS | Status: DC | PRN
  Administered 2016-01-05: 1.75 mg/kg/h via INTRAVENOUS

## 2016-01-05 MED ORDER — FENTANYL CITRATE (PF) 100 MCG/2ML IJ SOLN
INTRAMUSCULAR | Status: AC
  Filled 2016-01-05: qty 2

## 2016-01-05 MED ORDER — ENSURE ENLIVE PO LIQD
237.0000 mL | Freq: Two times a day (BID) | ORAL | Status: DC
  Administered 2016-01-05 – 2016-01-08 (×5): 237 mL via ORAL

## 2016-01-05 MED ORDER — PREDNISONE 10 MG PO TABS: 10.0000 mg | ORAL_TABLET | Freq: Once | ORAL | Status: DC

## 2016-01-05 MED ORDER — BIVALIRUDIN 250 MG IV SOLR
INTRAVENOUS | Status: AC
  Filled 2016-01-05: qty 250

## 2016-01-05 SURGICAL SUPPLY — 25 items
BALLN EMERGE MR 2.0X12 (BALLOONS) ×3
BALLN EUPHORA RX 2.0X12 (BALLOONS)
BALLN EUPHORA RX 2.5X15 (BALLOONS) ×3
BALLN ~~LOC~~ EUPHORA RX 3.25X20 (BALLOONS) ×3
BALLOON EMERGE MR 2.0X12 (BALLOONS) ×1 IMPLANT
BALLOON EUPHORA RX 2.0X12 (BALLOONS) IMPLANT
BALLOON EUPHORA RX 2.5X15 (BALLOONS) ×1 IMPLANT
BALLOON ~~LOC~~ EUPHORA RX 3.25X20 (BALLOONS) ×1 IMPLANT
CATH EXTRAC PRONTO LP 6F RND (CATHETERS) ×3 IMPLANT
CATH INFINITI 5FR MULTPACK ANG (CATHETERS) ×3 IMPLANT
CATH S G BIP PACING (SET/KITS/TRAYS/PACK) ×3 IMPLANT
ELECT DEFIB PAD ADLT CADENCE (PAD) ×3 IMPLANT
GUIDE CATH RUNWAY 6FR FR4 (CATHETERS) ×3 IMPLANT
KIT ENCORE 26 ADVANTAGE (KITS) ×3 IMPLANT
KIT HEART LEFT (KITS) ×3 IMPLANT
PACK CARDIAC CATHETERIZATION (CUSTOM PROCEDURE TRAY) ×3 IMPLANT
SHEATH PINNACLE 6F 10CM (SHEATH) ×6 IMPLANT
SLEEVE REPOSITIONING LENGTH 30 (MISCELLANEOUS) ×3 IMPLANT
STENT SYNERGY DES 3X24 (Permanent Stent) ×3 IMPLANT
SYR MEDRAD MARK V 150ML (SYRINGE) ×3 IMPLANT
TRANSDUCER W/STOPCOCK (MISCELLANEOUS) ×3 IMPLANT
TUBING CIL FLEX 10 FLL-RA (TUBING) ×3 IMPLANT
WIRE EMERALD 3MM-J .035X150CM (WIRE) ×3 IMPLANT
WIRE EMERALD ST .035X150CM (WIRE) IMPLANT
WIRE PT2 MS 185 (WIRE) ×3 IMPLANT

## 2016-01-05 NOTE — Progress Notes (Signed)
Orthopedic Tech Progress Note Patient Details:  Monica Copeland 1940-11-22 [id number] Delivered Velcro knee immobilizer to pt.'s nurse.     Darrol Poke 01/05/2016, 2:07 PM

## 2016-01-05 NOTE — ED Provider Notes (Signed)
MSE was initiated and I personally evaluated the patient and placed orders (if any) at  11:13 AM on Jan 05, 2016.  The patient appears stable so that the remainder of the MSE may be completed by another provider.  Stopped briefly in ER for EKG check since it was unable to transmit. EKG does show inferior ST elevation. His bradycardic which is also go along with an inferior MI. Patient will go up directed to the Cath Lab.  Davonna Belling, MD 01/05/16 1114

## 2016-01-05 NOTE — Progress Notes (Signed)
14:00-Right femoral arterial and venous sheaths oozing post-cath.  Leg immobilizer applied and patient requires frequent reminders to keep right left straight and to not sit up greater than 30 degrees.  Site reinforced with gauze and Ysidro Evert from cath lab assisted with placing modified-pressure dressing until sheaths could be removed.    17:30-ACT 131.  Right femoral arterial sheath removed and pressure held for 25 minutes until hemostasis achieved.  Right venous sheath removed and pressure held for 15 minutes until hemostasis achieved.  Site level 1 from previous bruising.  Pedal pulses +2 bilaterally.  Pressure dressing applied.  Will continue to monitor. Monica Copeland, Ardeth Sportsman

## 2016-01-05 NOTE — H&P (Signed)
Monica Copeland is an 75 y.o. female.    Primary Cardiologist: new (was Dr. Ron Parker)    PCP: Ann Held, DO   Internal med:  Dr. Cordelia Pen  Chief Complaint: pt developed chest pain and was brought emergently to Cath lab for STEMI with ST elevation inf leads  HPI:  75 year old female was in her usual state of health this AM and after walking she developed SOB and chest pain.  She called her neighbor who called 911.  She was given NTG by EMS per pt.  She was bradycardic prior to the lab.   No prior cardiac hx.   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  Also hx of gastric AVMs, DM2 HTN, and hyperlipidemia.    EGD in 09/2015 with GERD and esophagitis. She was also to have TKR by Dt. Wainer on 01/17/16.   Past Medical History  Diagnosis Date  . Hypertension   . Hyperlipemia   . Asthma   . Diabetes mellitus without complication ([place])   . Asthma   . Back pain   . Seizures ([place])   . Anxiety   . Cushing disease ([place])   . GERD (gastroesophageal reflux disease) 01/24/2012  . Emphysema of lung ([place])   . Hyperlipidemia   . Ulcer   . Hemorrhoid   . IBS (irritable bowel syndrome)   . Iron deficiency anemia   . Gastric AVM     Past Surgical History  Procedure Laterality Date  . 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 Colon surgery      colon perforation  . Appendectomy    . Candida esophagitis    . Pylorus ulcer    . [place]    Family History  Problem Relation Age of Onset  . Hypertension Mother   . Hypertension Brother   . Heart attack Mother   . Diabetes Father   . Asthma Father   . Asthma Son   . Lung  cancer Father   . Esophageal cancer Brother   . Liver cancer Brother     mets from esophagus  . Colon cancer Neg Hx    Social History:  reports that she has been smoking Cigarettes.  She has been smoking about 0.75 packs per day. She has never used smokeless tobacco. She reports that she does not drink alcohol or use illicit drugs.  Allergies:  Allergies  Allergen Reactions  . Tomato Rash    Medications Prior to Admission  Medication Sig Dispense Refill  . ALPRAZolam (XANAX) 0.5 MG tablet TAKE 1/2 TO 1 TABLET BY MOUTH 2 TIMES A DAY AS NEEDED FOR ANXIETY. 60 tablet 0  . atorvastatin (LIPITOR) 40 MG tablet TAKE 1 TABLET BY MOUTH ONCE DAILY 30 tablet 2  . budesonide-formoterol (SYMBICORT) 80-4.5 MCG/ACT inhaler Inhale 2 puffs into the lungs as needed.    . famotidine (PEPCID) 20 MG tablet Take 1 tablet (20 mg total) by mouth 2 (two) times daily. 60 tablet 0  . feeding supplement, ENSURE COMPLETE, (ENSURE COMPLETE) LIQD Take 237 mLs by mouth 2 (two)  times daily between meals. 237 mL 0  . fenofibrate 160 MG tablet TAKE 1 TABLET BY MOUTH ONCE DAILY 30 tablet 4  . ferrous sulfate 325 (65 FE) MG tablet TAKE 1 TABLET BY MOUTH TWICE A DAY BEFORE LUNCH AND SUPPER 60 tablet 5  . FLUoxetine (PROZAC) 20 MG capsule TAKE 1 CAPSULE BY MOUTH ONCE DAILY 30 capsule 5  . hyoscyamine (LEVSIN SL) 0.125 MG SL tablet PLACE 1 TABLET UNDER THE TONGUE EVERY 4 HOURS AS NEEDED. 30 tablet 0  . levothyroxine (SYNTHROID, LEVOTHROID) 25 MCG tablet Take 1 tablet (25 mcg total) by mouth daily before breakfast. 30 tablet 0  . omeprazole (PRILOSEC) 20 MG capsule Take 1 capsule (20 mg total) by mouth daily. 30 capsule 11  . potassium chloride SA (K-DUR,KLOR-CON) 20 MEQ tablet Take 1 tablet (20 mEq total) by mouth daily. 30 tablet 2  . predniSONE (DELTASONE) 5 MG tablet Take 1 tablet (5 mg total) by mouth daily. 90 tablet 3  . PROAIR HFA 108 (90 BASE) MCG/ACT inhaler Inhale 1-2 puffs into the lungs every 6 (six) hours as  needed for wheezing or shortness of breath.     Marland Kitchen ZETIA 10 MG tablet TAKE 1 TABLET BY MOUTH DAILY 30 tablet 2    Results for orders placed or performed during the hospital encounter of 01/05/16 (from the past 48 hour(s))  CBC with Differential/Platelet     Status: Abnormal   Collection Time: 01/05/16 11:40 AM  Result Value Ref Range   WBC 9.8 4.0 - 10.5 K/uL   RBC 3.84 (L) 3.87 - 5.11 MIL/uL   Hemoglobin 11.8 (L) 12.0 - 15.0 g/dL   HCT 35.6 (L) 36.0 - 46.0 %   MCV 92.7 78.0 - 100.0 fL   MCH 30.7 26.0 - 34.0 pg   MCHC 33.1 30.0 - 36.0 g/dL   RDW 14.6 11.5 - 15.5 %   Platelets 264 150 - 400 K/uL   Neutrophils Relative % 72 %   Neutro Abs 7.0 1.7 - 7.7 K/uL   Lymphocytes Relative 22 %   Lymphs Abs 2.1 0.7 - 4.0 K/uL   Monocytes Relative 6 %   Monocytes Absolute 0.6 0.1 - 1.0 K/uL   Eosinophils Relative 0 %   Eosinophils Absolute 0.0 0.0 - 0.7 K/uL   Basophils Relative 0 %   Basophils Absolute 0.0 0.0 - 0.1 K/uL  CK     Status: None   Collection Time: 01/05/16 11:40 AM  Result Value Ref Range   Total CK 54 38 - 234 U/L  Comprehensive metabolic panel     Status: Abnormal   Collection Time: 01/05/16 11:40 AM  Result Value Ref Range   Sodium 138 135 - 145 mmol/L   Potassium 3.4 (L) 3.5 - 5.1 mmol/L   Chloride 105 101 - 111 mmol/L   CO2 20 (L) 22 - 32 mmol/L   Glucose, Bld 137 (H) 65 - 99 mg/dL   BUN 22 (H) 6 - 20 mg/dL   Creatinine, Ser 1.21 (H) 0.44 - 1.00 mg/dL   Calcium 8.4 (L) 8.9 - 10.3 mg/dL   Total Protein 5.7 (L) 6.5 - 8.1 g/dL   Albumin 3.1 (L) 3.5 - 5.0 g/dL   AST 21 15 - 41 U/L   ALT 19 14 - 54 U/L   Alkaline Phosphatase 39 38 - 126 U/L   Total Bilirubin 0.5 0.3 - 1.2 mg/dL   GFR calc non Af Amer 43 (L) >60 mL/min   GFR calc Af Wyvonnia Lora  49 (L) >60 mL/min    Comment: (NOTE) The eGFR has been calculated using the CKD EPI equation. This calculation has not been validated in all clinical situations. eGFR's persistently <60 mL/min signify possible Chronic  Kidney Disease.    Anion gap 13 5 - 15  Lipid panel     Status: Abnormal   Collection Time: 01/05/16 11:40 AM  Result Value Ref Range   Cholesterol 152 0 - 200 mg/dL   Triglycerides 144 <150 mg/dL   HDL 34 (L) >40 mg/dL   Total CHOL/HDL Ratio 4.5 RATIO   VLDL 29 0 - 40 mg/dL   LDL Cholesterol 89 0 - 99 mg/dL    Comment:        Total Cholesterol/HDL:CHD Risk Coronary Heart Disease Risk Table                     Men   Women  1/2 Average Risk   3.4   3.3  Average Risk       5.0   4.4  2 X Average Risk   9.6   7.1  3 X Average Risk  23.4   11.0        Use the calculated Patient Ratio above and the CHD Risk Table to determine the patient's CHD Risk.        ATP III CLASSIFICATION (LDL):  <100     mg/dL   Optimal  100-129  mg/dL   Near or Above                    Optimal  130-159  mg/dL   Borderline  160-189  mg/dL   High  >190     mg/dL   Very High   Protime-INR     Status: None   Collection Time: 01/05/16 11:40 AM  Result Value Ref Range   Prothrombin Time 14.1 11.6 - 15.2 seconds   INR 1.07 0.00 - 1.49  APTT     Status: None   Collection Time: 01/05/16 11:40 AM  Result Value Ref Range   aPTT 29 24 - 37 seconds  Troponin I     Status: Abnormal   Collection Time: 01/05/16 11:40 AM  Result Value Ref Range   Troponin I 0.04 (H) <0.031 ng/mL    Comment:        PERSISTENTLY INCREASED TROPONIN VALUES IN THE RANGE OF 0.04-0.49 ng/mL CAN BE SEEN IN:       -UNSTABLE ANGINA       -CONGESTIVE HEART FAILURE       -MYOCARDITIS       -CHEST TRAUMA       -ARRYHTHMIAS       -LATE PRESENTING MYOCARDIAL INFARCTION       -COPD   CLINICAL FOLLOW-UP RECOMMENDED.   I-STAT, chem 8     Status: Abnormal   Collection Time: 01/05/16 11:42 AM  Result Value Ref Range   Sodium 138 135 - 145 mmol/L   Potassium 3.4 (L) 3.5 - 5.1 mmol/L   Chloride 100 (L) 101 - 111 mmol/L   BUN 23 (H) 6 - 20 mg/dL   Creatinine, Ser 1.00 0.44 - 1.00 mg/dL   Glucose, Bld 132 (H) 65 - 99 mg/dL   Calcium,  Ion 1.15 1.13 - 1.30 mmol/L   TCO2 23 0 - 100 mmol/L   Hemoglobin 13.3 12.0 - 15.0 g/dL   HCT 39.0 36.0 - 46.0 %  POCT Activated clotting time  Status: None   Collection Time: 01/05/16 11:51 AM  Result Value Ref Range   Activated Clotting Time 353 seconds   No results found.  ROS: General:no colds or fevers, no weight changes Skin:no rashes or ulcers HEENT:no blurred vision, no congestion CV:see HPI PUL:see HPI GI:no diarrhea constipation or melena, no indigestion GU:no hematuria, no dysuria MS:+ joint pain especially knee, no claudication Neuro:no syncope, no lightheadedness Endo:+ diabetes, no thyroid disease  Blood pressure 118/58, pulse 73, temperature 97.7 F (36.5 C), temperature source Oral, resp. rate 21, height 5' (1.524 m), weight 136 lb 7.4 oz (61.9 kg), SpO2 100 %. PE: General:Pleasant affect, NAD Skin:Warm and dry, brisk capillary refill HEENT:normocephalic, sclera clear, mucus membranes moist, glasses in place Neck:supple, no JVD, no bruits  Heart:S1S2 RRR without murmur, gallup, rub or click Lungs:clear, ant without rales, rhonchi, or wheezes XBJ:YNWG, non tender, + BS, do not palpate liver spleen or masses Ext:no lower ext edema, 1+ pedal pulses, 2+ radial pulses Neuro:alert and oriented X 3, MAE, follows commands, + facial symmetry    Assessment/Plan Principal Problem:   ST elevation (STEMI) myocardial infarction involving right coronary artery (HCC)- s/p stent to mRCA with DES  Active Problems:   Hyperlipidemia on zetia and lipitor at home   COPD with asthma ([place])   Tobacco use disorder about 1/2 ppd but she states she is stopping now   ST elevation myocardial infarction involving right coronary artery (HCC)   VF (ventricular fibrillation) (HCC) during the cath    Bradycardia improved   HTN controlled   GERD    Cushing syndrome on steroids    Knee pain for for total knee on the 22nd this month will let Dr. Noemi Chapel know to cancel.         Dr.  Claiborne Billings has seen and examined.  ADDENDUM once in 2H unit pt restless and could not void, oozing at cath site rt groin.  We placed a catheter for now and knee immobilizer.     [place] Nurse Practitioner Certified [place] Pager [phone] or after 5pm or weekends call [phone] 01/05/2016, 2:13 PM  Patient seen and examined. Agree with assessment and plan.  Ms. Monica Copeland is a 75 year old female who has a history of Cushing's disease for which she has been on chronic steroid supplementation, as well as Synthroid.  She has a history of hypertension, hyperlipidemia, asthma, and tobacco use.  This morning she developed significant chest pain associated with shortness of breath.  She was brought by [facility] and was encoded as an inferior wall ST segment elevation MI.  In transit, she became hypotensive and bradycardic and required transient external pacing.  She is now brought acutely to the cardiac catheterization laboratory for emergent catheterization and probable RCA intervention.   Troy Sine, MD, [facility] 01/05/2016 5:14 PM

## 2016-01-06 ENCOUNTER — Other Ambulatory Visit: Payer: Self-pay

## 2016-01-06 ENCOUNTER — Ambulatory Visit: Payer: Medicare Other | Admitting: Family Medicine

## 2016-01-06 ENCOUNTER — Telehealth: Payer: Self-pay | Admitting: Family Medicine

## 2016-01-06 ENCOUNTER — Encounter (HOSPITAL_COMMUNITY): Payer: Self-pay | Admitting: Cardiovascular Disease

## 2016-01-06 DIAGNOSIS — E249 Cushing's syndrome, unspecified: Secondary | ICD-10-CM

## 2016-01-06 DIAGNOSIS — I10 Essential (primary) hypertension: Secondary | ICD-10-CM

## 2016-01-06 LAB — BASIC METABOLIC PANEL
ANION GAP: 11 (ref 5–15)
BUN: 14 mg/dL (ref 6–20)
CALCIUM: 8 mg/dL — AB (ref 8.9–10.3)
CO2: 18 mmol/L — AB (ref 22–32)
CREATININE: 1.03 mg/dL — AB (ref 0.44–1.00)
Chloride: 110 mmol/L (ref 101–111)
GFR, EST NON AFRICAN AMERICAN: 52 mL/min — AB (ref >60)
Glucose, Bld: 131 mg/dL — ABNORMAL HIGH (ref 65–99)
Potassium: 3.8 mmol/L (ref 3.5–5.1)
SODIUM: 139 mmol/L (ref 135–145)

## 2016-01-06 LAB — HEMOGLOBIN A1C
HEMOGLOBIN A1C: 6.1 % — AB (ref 4.8–5.6)
MEAN PLASMA GLUCOSE: 128 mg/dL

## 2016-01-06 LAB — TROPONIN I: TROPONIN I: 9.59 ng/mL — AB (ref <0.031)

## 2016-01-06 LAB — CBC
HCT: 29.6 % — ABNORMAL LOW (ref 36.0–46.0)
Hemoglobin: 9.7 g/dL — ABNORMAL LOW (ref 12.0–15.0)
MCH: 29.6 pg (ref 26.0–34.0)
MCHC: 32.8 g/dL (ref 30.0–36.0)
MCV: 90.2 fL (ref 78.0–100.0)
Platelets: 252 10*3/uL (ref 150–400)
RBC: 3.28 MIL/uL — ABNORMAL LOW (ref 3.87–5.11)
RDW: 14.7 % (ref 11.5–15.5)
WBC: 14.4 10*3/uL — ABNORMAL HIGH (ref 4.0–10.5)

## 2016-01-06 NOTE — Progress Notes (Signed)
Pt comfortable   Spoke with nursing re chest discomfort earlier   Pt very anxious at time . I am not convinced of acitve ischemia Reassured pt Plan to tx to foor tomorrow.  Ambulate  Follow response

## 2016-01-06 NOTE — Progress Notes (Signed)
Hematoma noticed on patient at 9pm. Manual pressure held for another ten minutes until site soft. Large bruise in area. VSS throughout. Patient does not report any back pain. [facility] ordered follow up CBC and another in the morning. Will continue to monitor.

## 2016-01-06 NOTE — Telephone Encounter (Signed)
Pt's son called in at 11:30 to inform PCP that pt will not be at her appt today. She had a heart attack and is in the hospital.

## 2016-01-06 NOTE — Progress Notes (Signed)
.  CARDIAC REHAB PHASE I   PRE:  Rate/Rhythm: 78 SR   BP:  Supine: 111/53  Sitting:   Standing:    SaO2: 100%RA  MODE:  Ambulation: 350 ft   POST:  Rate/Rhythm: 113 ST  BP:  Supine:   Sitting: 130/60  Standing:    SaO2: 100%RA 0930-1050 Pt walked 350 ft with rolling walker and asst x 1. Very impulsive and does not wait for instruction. Tired to get pt to slow down. To recliner with call bell. Instructed not to get up by herself. No DOE noted but pt stated she felt SOB during walk. No CP. Sons were present for ed. Pt seems to have a little difficulty comprehending. They said they would organize her meds for her at home and re enforce ed. Reviewed importance of brilinta with stent . Pt will need brilinta card before she leaves. Reviewed smoking cessation and gave fake cigarette and gave handout. Reviewed NTG use, MI restrictions and heart healthy diet. Discussed CRP 2 and will refer to [place].   Graylon Good, RN BSN  01/06/2016 10:49 AM

## 2016-01-06 NOTE — Progress Notes (Signed)
Patient Name: Monica Copeland Date of Encounter: 01/06/2016  Primary Cardiologist: Dr. Claiborne Billings   Principal Problem:   ST elevation (STEMI) myocardial infarction involving right coronary artery [facility]) Active Problems:   Hyperlipidemia   Cushing syndrome ([place])   COPD with asthma ([place])   Tobacco use disorder   Knee pain   VF (ventricular fibrillation) (HCC)   Bradycardia   HTN (hypertension)    SUBJECTIVE  Only had mild chest discomfort after cath, did not last very long, otherwise feeling ok. Determined to quit smoking now.   CURRENT MEDS . aspirin EC  81 mg Oral Daily  . atorvastatin  80 mg Oral q1800  . feeding supplement (ENSURE ENLIVE)  237 mL Oral BID BM  . ferrous sulfate  325 mg Oral Q breakfast  . FLUoxetine  20 mg Oral Daily  . hydrALAZINE  10 mg Intravenous Once  . levothyroxine  25 mcg Oral QAC breakfast  . mometasone-formoterol  2 puff Inhalation BID  . pantoprazole  40 mg Oral Daily  . [START ON 01/07/2016] predniSONE  10 mg Oral Once   Followed by  . [START ON 01/08/2016] predniSONE  5 mg Oral Q breakfast  . sodium chloride flush  3 mL Intravenous Q12H  . ticagrelor  90 mg Oral BID    OBJECTIVE  Filed Vitals:   01/06/16 0400 01/06/16 0500 01/06/16 0615 01/06/16 0750  BP: 108/46 100/46 109/52   Pulse: 73 52 63   Temp:  97.9 F (36.6 C)  97.3 F (36.3 C)  TempSrc:  Axillary  Oral  Resp: 21 14 26    Height:      Weight:      SpO2: 99% 100% 80%     Intake/Output Summary (Last 24 hours) at 01/06/16 0830 Last data filed at 01/06/16 0600  Gross per 24 hour  Intake 3001.63 ml  Output   1500 ml  Net 1501.63 ml   Filed Weights   01/05/16 1307 01/05/16 1321  Weight: 136 lb 7.4 oz (61.9 kg) 136 lb 7.4 oz (61.9 kg)    PHYSICAL EXAM  General: Pleasant, NAD. Neuro: Alert and oriented X 3. Moves all extremities spontaneously. Psych: Normal affect. HEENT:  Normal  Neck: Supple without bruits or JVD. Lungs:  Resp regular and unlabored, CTA. Heart:  RRR no s3, s4, or murmurs. R femoral cath site bruised, mildly tender, but no bruit on exam. Abdomen: Soft, non-tender, non-distended, BS + x 4.  Extremities: No clubbing, cyanosis or edema. DP/PT/Radials 2+ and equal bilaterally.  Accessory Clinical Findings  CBC  Recent Labs  01/05/16 1140  01/05/16 2214 01/06/16 0222  WBC 9.8  < > 19.0* 14.4*  NEUTROABS 7.0  --   --   --   HGB 11.8*  < > 10.0* 9.7*  HCT 35.6*  < > 29.7* 29.6*  MCV 92.7  < > 91.4 90.2  PLT 264  < > 251 252  < > = values in this interval not displayed. Basic Metabolic Panel  Recent Labs  01/05/16 1140 01/05/16 1142 01/05/16 1600 01/06/16 0222  NA 138 138  --  139  K 3.4* 3.4*  --  3.8  CL 105 100*  --  110  CO2 20*  --   --  18*  GLUCOSE 137* 132*  --  131*  BUN 22* 23*  --  14  CREATININE 1.21* 1.00  --  1.03*  CALCIUM 8.4*  --   --  8.0*  MG  --   --  1.7  --    Liver Function Tests  Recent Labs  01/05/16 1140  AST 21  ALT 19  ALKPHOS 39  BILITOT 0.5  PROT 5.7*  ALBUMIN 3.1*   Cardiac Enzymes  Recent Labs  01/05/16 1140 01/05/16 1600 01/05/16 2053 01/06/16 0222  CKTOTAL 54  --   --   --   TROPONINI 0.04* 4.55* 10.19* 9.59*   Hemoglobin A1C  Recent Labs  01/05/16 1140  HGBA1C 6.1*   Fasting Lipid Panel  Recent Labs  01/05/16 1140  CHOL 152  HDL 34*  LDLCALC 89  TRIG 144  CHOLHDL 4.5   Thyroid Function Tests  Recent Labs  01/05/16 1600  TSH 1.372    TELE NSR with HR 60s, no significant bradycardia or ventricular ectopy    ECG  NSR with TWI in inferior leads, ST elevation has resolved  Cardiac cath 01/05/2016  Conclusion     Prox RCA to Mid RCA lesion, 99% stenosed. Post intervention, there is a 0% residual stenosis. The lesion was not previously treated.  The left ventricular systolic function is normal.  RPDA lesion, 100% stenosed. Post intervention, there is a 0% residual stenosis.  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   Normal left main, LAD and ramus intermediate vessel arising from the left coronary cusp.  Insertion of a temporary pacemaker for symptomatically bradycardia and hypotension.  Ventricular fibrillation requiring CPR and successful defibrillation to restore sinus rhythm.  Successful PCI to the RCA requiring PTCA, thrombectomy with significant thrombus removal, and ultimate stenting with a 3.024 mm Synergy DES stent postdilated to 3.25 mm with the subtotal stenosis. Reduced to 0 and restoration of TIMI-3 flow throughout the entire dominant RCA distribution.  Anticoagulation utilizing Angiomax, Brilinta 180 mg orally, and intravenous Aggrastat.  RECOMMENDATION: The patient will require dual antiplatelet therapy for minimum of a year. She has been on chronic prednisone and may require several days of increased dose prior to resuming her daily low dose. Looking cessation is imperative.      Radiology/Studies  Dg Chest Port 1 View  01/05/2016  CLINICAL DATA:  Chest pain and shortness of breath for 1 day. Initial encounter. EXAM: PORTABLE CHEST 1 VIEW COMPARISON:  Single-view of the chest 06/23/2015. PA and lateral chest 08/04/2014. FINDINGS: Heart size is mildly enlarged. Lungs are clear. No pneumothorax or pleural effusion. IMPRESSION: No acute disease. Electronically Signed   By: Inge Rise M.D.   On: 01/05/2016 16:06    ASSESSMENT AND PLAN  1. Inferior STEMI  - In transit by EMS, she became hypotensive and bradycardic and required transient external pacing  - cath 01/05/2016 EF 55%, 99% prox to mid RCA treated with PTCA/thrombectomy/3.0x23mm Synergy DES postdilated to 3.86mm, 100% RPDA treated with  ?PTCA?  - cath complicated by symptomatic bradycardia and hypotension requiring placement of temp pacer and VF requiring CPR and successful defibrillation  - continue ASA and brilinta, recent bradycardia likely triggered by RCA stenosis, however will hold off on starting any BB at this point. Continue high dose statin. BP borderline low on no BP meds. May consider ambulate today and transfer to telemetry if doing ok.   2. VF during cath: see above  3. 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 HTN: BP borderline low  5. HLD: on statin  Dose increased to 80 mg    6. Tobacco abuse: patient states she has decided to quit after this MI  Signed, [place]ward Ku Pager: [phone]   Pt seen and examined  I agree with findings as noted by Janan Ridge above   I have amended to reflect my findings   Pt denies CP  Breathing is good  BP is improving  HR 60s to 70    On exam:  LUngs are clear  Cardiac:  RRR  No murmurs  R groin without hematoma or bruit  No edema Agree with holding b blocker for right now  Follow HR   I would like to get patient up some before transferring to floor ON steroid taper (Hx cushings dz)    Dorris Carnes

## 2016-01-06 NOTE — Telephone Encounter (Signed)
Noted, provider aware

## 2016-01-06 NOTE — Progress Notes (Signed)
Pt c/o chest pain on left upper chest. Worse to touch and with a deep breath. She has also been experiencing some intermittent SOB. EKG negative and VSS. Pt became tearful and admitted to feeling scared. Emotional support provided and PRN valium given. Will continue to monitor and provided support.

## 2016-01-07 ENCOUNTER — Inpatient Hospital Stay (HOSPITAL_COMMUNITY)
Admission: RE | Admit: 2016-01-07 | Discharge: 2016-01-07 | Disposition: A | Discharge status: Home / Self Care | Payer: Self-pay | Source: Ambulatory Visit

## 2016-01-07 DIAGNOSIS — F17200 Nicotine dependence, unspecified, uncomplicated: Secondary | ICD-10-CM

## 2016-01-07 DIAGNOSIS — E785 Hyperlipidemia, unspecified: Secondary | ICD-10-CM

## 2016-01-07 DIAGNOSIS — R001 Bradycardia, unspecified: Secondary | ICD-10-CM

## 2016-01-07 NOTE — Progress Notes (Addendum)
CARDIAC REHAB PHASE I   PRE:  Rate/Rhythm: 71 SR    BP: sitting 92/41    SaO2: 100 RA  MODE:  Ambulation: 250 ft   POST:  Rate/Rhythm: 92 SR    BP: sitting 104/43     SaO2: 100 RA  Pt anxious to walk therefore no time to get RW. Used gait belt, assist x1. C/o left knee pain, limping during walk. Tired on return to room. Encouraged her to use RW at home. She thinks she can walk with RW and walk her dog on the leash. Discussed how this is unsafe with her son present but she seems slow to comprehend. Suggest HHPT for safety eval and planning. Also encouraged son to give her 24 hr supervision at first. Encouraged her to walk what she can at home given knee pain. No further questions from pt or son. Planning to attend CRPII. [place], ACSM 01/07/2016 11:44 AM

## 2016-01-07 NOTE — Progress Notes (Signed)
PATIENT ID: [id number] with hypertension, hyperlipidemia, and ongoing tobacco abuse here with inferior STEMI complicated by symptomatic bradycardia requiring transient external pacing and VF during cath.   SUBJECTIVE:  Feels well.  Up several times overnight to urinate so she didn't sleep well. Denies chest pain or shortness of breath.    PHYSICAL EXAM Filed Vitals:   01/07/16 0100 01/07/16 0200 01/07/16 0300 01/07/16 0745  BP: 112/48 102/45 101/58 119/62  Pulse: 84 62 61 61  Temp:    97.8 F (36.6 C)  TempSrc:    Oral  Resp: 24 19 17 20   Height:      Weight:      SpO2: 98% 97% 96% 98%   General:  Well-appearing.  No acute distress.  Neck: No JVD Lungs:  Coarse rhonchi throughout Heart:  RRR.  No m/r/g.  Abdomen:  Soft, NT, ND.  +BS  Extremities:  WWP.  No edema.  R femoral cath site bruising.  No hematoma or bruit.   LABS: Lab Results  Component Value Date   TROPONINI 9.59* 01/06/2016   No results found for this or any previous visit (from the past 24 hour(s)).  Intake/Output Summary (Last 24 hours) at 01/07/16 0840 Last data filed at 01/07/16 0742  Gross per 24 hour  Intake   1070 ml  Output   1281 ml  Net   -211 ml    Telemetry: sinus rhythm.  Occasional PVCs  Cardiac cath 01/05/2016  Conclusion     Prox RCA to Mid RCA lesion, 99% stenosed. Post intervention, there is a 0% residual stenosis. The lesion was not previously treated.  The left ventricular systolic function is normal.  RPDA lesion, 100% stenosed. Post intervention, there is a 0% residual stenosis.  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   Normal left main, LAD and ramus intermediate vessel arising from the  left coronary cusp.  Insertion of a temporary pacemaker for symptomatically bradycardia and hypotension.  Ventricular fibrillation requiring CPR and successful defibrillation to restore sinus rhythm.  Successful PCI to the RCA requiring PTCA, thrombectomy with significant thrombus removal, and ultimate stenting with a 3.024 mm Synergy DES stent postdilated to 3.25 mm with the subtotal stenosis. Reduced to 0 and restoration of TIMI-3 flow throughout the entire dominant RCA distribution.  Anticoagulation utilizing Angiomax, Brilinta 180 mg orally, and intravenous Aggrastat.  RECOMMENDATION: The patient will require dual antiplatelet therapy for minimum of a year. She has been on chronic prednisone and may require several days of increased dose prior to resuming her daily low dose. Looking cessation is imperative.           ASSESSMENT AND PLAN:  Principal Problem:   ST elevation (STEMI) myocardial infarction involving right coronary artery (HCC) Active Problems:   Hyperlipidemia   Cushing syndrome (HCC)   COPD with asthma ([place])   Tobacco use disorder   Knee pain   VF (ventricular fibrillation) (HCC)   Bradycardia   HTN (hypertension)   # CAD s/p inferior STEMI: Stable and chest pain free.  - In transit by EMS, she became hypotensive and bradycardic and required transient external pacing - cath 01/05/2016 EF 55%, 99% prox to mid RCA treated with PTCA/thrombectomy/3.0x39mm Synergy DES postdilated to 3.20mm, 100%  RPDA treated with ?PTCA? - cath complicated by symptomatic bradycardia and hypotension requiring placement of temp pacer and VF requiring CPR and successful defibrillation - continue ASA and brilinta, recent bradycardia likely triggered by RCA stenosis, however will hold off on starting any BB at this point. Continue high dose statin. BP borderline low on no BP meds.   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  Likely home tomorrow if able to ambulate  Ahmeer Tuman C. Oval Linsey, MD, [facility] 01/07/2016 8:40 AM

## 2016-01-08 ENCOUNTER — Inpatient Hospital Stay (HOSPITAL_COMMUNITY): Payer: Medicare Other

## 2016-01-08 DIAGNOSIS — I213 ST elevation (STEMI) myocardial infarction of unspecified site: Secondary | ICD-10-CM

## 2016-01-08 LAB — BASIC METABOLIC PANEL
ANION GAP: 11 (ref 5–15)
BUN: 27 mg/dL — ABNORMAL HIGH (ref 6–20)
CALCIUM: 8.7 mg/dL — AB (ref 8.9–10.3)
CO2: 23 mmol/L (ref 22–32)
Chloride: 104 mmol/L (ref 101–111)
Creatinine, Ser: 1.47 mg/dL — ABNORMAL HIGH (ref 0.44–1.00)
GFR calc Af Amer: 39 mL/min — ABNORMAL LOW (ref >60)
GFR calc non Af Amer: 34 mL/min — ABNORMAL LOW (ref >60)
GLUCOSE: 127 mg/dL — AB (ref 65–99)
POTASSIUM: 3.5 mmol/L (ref 3.5–5.1)
Sodium: 138 mmol/L (ref 135–145)

## 2016-01-08 LAB — CBC
HEMATOCRIT: 27.8 % — AB (ref 36.0–46.0)
HEMOGLOBIN: 8.9 g/dL — AB (ref 12.0–15.0)
MCH: 29.5 pg (ref 26.0–34.0)
MCHC: 32 g/dL (ref 30.0–36.0)
MCV: 92.1 fL (ref 78.0–100.0)
Platelets: 214 10*3/uL (ref 150–400)
RBC: 3.02 MIL/uL — ABNORMAL LOW (ref 3.87–5.11)
RDW: 15.6 % — ABNORMAL HIGH (ref 11.5–15.5)
WBC: 17.4 10*3/uL — ABNORMAL HIGH (ref 4.0–10.5)

## 2016-01-08 LAB — ECHOCARDIOGRAM COMPLETE
HEIGHTINCHES: 60 in
Weight: 2257.51 oz

## 2016-01-08 MED ORDER — ASPIRIN 81 MG PO TBEC: 81.0000 mg | DELAYED_RELEASE_TABLET | Freq: Every day | ORAL | Status: AC

## 2016-01-08 MED ORDER — LISINOPRIL 2.5 MG PO TABS: 2.5000 mg | ORAL_TABLET | Freq: Every day | ORAL | Status: AC

## 2016-01-08 MED ORDER — ATORVASTATIN CALCIUM 80 MG PO TABS: 80.0000 mg | ORAL_TABLET | Freq: Every day | ORAL | Status: DC

## 2016-01-08 MED ORDER — TICAGRELOR 90 MG PO TABS: 90.0000 mg | ORAL_TABLET | Freq: Two times a day (BID) | ORAL | Status: AC

## 2016-01-08 MED ORDER — PANTOPRAZOLE SODIUM 40 MG PO TBEC: 40.0000 mg | DELAYED_RELEASE_TABLET | Freq: Every day | ORAL | Status: DC

## 2016-01-08 MED ORDER — LISINOPRIL 2.5 MG PO TABS
2.5000 mg | ORAL_TABLET | Freq: Every day | ORAL | Status: DC
  Filled 2016-01-08: qty 1

## 2016-01-08 NOTE — Discharge Summary (Signed)
Discharge Summary    Patient ID: Monica Copeland,  MRN: [name], DOB/AGE: 11-21-1940 75 y.o.  Admit date: 01/05/2016 Discharge date: 01/08/2016  Primary Care Provider: Ann Held Primary Cardiologist: Dr. Claiborne Billings  Discharge Diagnoses    Principal Problem:   ST elevation (STEMI) myocardial infarction involving right coronary artery [facility]) Active Problems:   Hyperlipidemia   Cushing syndrome ([place])   COPD with asthma ([place])   Tobacco use disorder   Knee pain   VF (ventricular fibrillation) (HCC)   Bradycardia   HTN (hypertension)   Allergies Allergies  Allergen Reactions  . Tomato Rash    Diagnostic Studies/Procedures  Coronary Stent Intervention 01/05/16 Left Heart Cath and Coronary Angiography Temporary Pacemaker     Prox RCA to Mid RCA lesion, 99% stenosed. Post intervention, there is a 0% residual stenosis. The lesion was not previously treated.  The left ventricular systolic function is normal.  RPDA lesion, 100% stenosed. Post intervention, there is a 0% residual stenosis.  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   Normal left main, LAD and ramus intermediate vessel arising from the left coronary cusp.  Insertion of a temporary pacemaker for symptomatically bradycardia and hypotension.  Ventricular fibrillation requiring CPR and successful defibrillation to restore sinus rhythm.  Successful PCI to the RCA requiring PTCA, thrombectomy with significant thrombus removal, and ultimate stenting with a 3.024 mm Synergy DES stent postdilated to 3.25 mm with the subtotal stenosis. Reduced to 0 and restoration of TIMI-3 flow throughout the entire dominant RCA  distribution.  Anticoagulation utilizing Angiomax, Brilinta 180 mg orally, and intravenous Aggrastat.  RECOMMENDATION: The patient will require dual antiplatelet therapy for minimum of a year. She has been on chronic prednisone and may require several days of increased dose prior to resuming her daily low dose. Looking cessation is imperative. _____________   History of Present Illness   [name] is a 75 year old female with a past medical history of HTN, Cushing's disease, HLD, DM, ongoing tobacco abuse, and GERD.  She presented to the ED on 5/10/17with chest pain. She had ST elevation in her inferior leads, code STEMI was called and he was taken urgently to the cath lab.   Hospital Course  [name] developed significant chest pain and SOB on the morning of 01/05/16, EMS was called. EKG showed inferior ST elevation and Code STEMI was called. While in transport to the hospital, she required transient external pacing.   Left heart cath revealed mid and proximal RCA 99% stenosis, there was also distal embolization to the mid PLA and PDA vessels. Patient had successful PCI with 3.024 mm Synergy DES stent to the RCA.  There was restoration of TIMI-3 flow throughout the RCA distribution.   She developed symptomatic bradycardia while in the cath lab, a temporary wire was placed. She also had one episode of ventricular fibrillation and was defibrillated to NSR.  Troponin peaked at 10.19.   Her temporary wire was discontinued, patient still had some bradycardia for which beta blocker initiation was held off. She did have a small hematoma at her right groin site that has some bruising.   She has ambulated with cardiac rehab. She will need DAPT with ASA and Brilinta for  one year. She was started on low dose ACEI and high dose statin. Her LDL was 89, she is on fenofibrate at home, will continue and follow up with LFT's at office visit.   _____________  Discharge Vitals Blood pressure 97/47,  pulse 67, temperature 98.2 F (36.8 C), temperature source Oral, resp. rate 21, height 5' (1.524 m), weight 141 lb 1.5 oz (64 kg), SpO2 100 %.  Filed Weights   01/05/16 1307 01/05/16 1321 01/08/16 0405  Weight: 136 lb 7.4 oz (61.9 kg) 136 lb 7.4 oz (61.9 kg) 141 lb 1.5 oz (64 kg)    Labs & Radiologic Studies     CBC  Recent Labs  01/05/16 2214 01/06/16 0222  WBC 19.0* 14.4*  HGB 10.0* 9.7*  HCT 29.7* 29.6*  MCV 91.4 90.2  PLT 251 [id number]   Basic Metabolic Panel  Recent Labs  01/05/16 1600 01/06/16 0222  NA  --  139  K  --  3.8  CL  --  110  CO2  --  18*  GLUCOSE  --  131*  BUN  --  14  CREATININE  --  1.03*  CALCIUM  --  8.0*  MG 1.7  --    Cardiac Enzymes  Recent Labs  01/05/16 1600 01/05/16 2053 01/06/16 0222  TROPONINI 4.55* 10.19* 9.59*   Thyroid Function Tests  Recent Labs  01/05/16 1600  TSH 1.372    Dg Chest Port 1 View  01/05/2016  CLINICAL DATA:  Chest pain and shortness of breath for 1 day. Initial encounter. EXAM: PORTABLE CHEST 1 VIEW COMPARISON:  Single-view of the chest 06/23/2015. PA and lateral chest 08/04/2014. FINDINGS: Heart size is mildly enlarged. Lungs are clear. No pneumothorax or pleural effusion. IMPRESSION: No acute disease. Electronically Signed   By: Inge Rise M.D.   On: 01/05/2016 16:06    Disposition   Pt is being discharged home today in good condition.  Follow-up Plans & Appointments  Follow up in 10-14 days, office will call to schedule.    Discharge Instructions    Amb Referral to Cardiac Rehabilitation    Complete by:  As directed   Diagnosis:   STEMI Coronary Stents             Discharge Medications   Current Discharge Medication List    START taking these medications   Details  aspirin EC 81 MG EC tablet Take 1 tablet (81 mg total) by mouth daily.    lisinopril (PRINIVIL,ZESTRIL) 2.5 MG tablet Take 1 tablet (2.5 mg total) by mouth daily. Qty: 30 tablet, Refills: 12    pantoprazole  (PROTONIX) 40 MG tablet Take 1 tablet (40 mg total) by mouth daily. Qty: 30 tablet, Refills: 12    ticagrelor (BRILINTA) 90 MG TABS tablet Take 1 tablet (90 mg total) by mouth 2 (two) times daily. Qty: 60 tablet, Refills: 12      CONTINUE these medications which have CHANGED   Details  atorvastatin (LIPITOR) 80 MG tablet Take 1 tablet (80 mg total) by mouth daily at 6 PM. Qty: 30 tablet, Refills: 12      CONTINUE these medications which have NOT CHANGED   Details  ALPRAZolam (XANAX) 0.5 MG tablet TAKE 1/2 TO 1 TABLET BY MOUTH 2 TIMES A DAY AS NEEDED FOR ANXIETY. Qty: 60 tablet, Refills: 0    budesonide-formoterol (SYMBICORT) 80-4.5 MCG/ACT inhaler Inhale 2 puffs into the lungs daily as needed. For shortness of breath    diphenhydramine-acetaminophen (TYLENOL PM) 25-500 MG TABS  tablet Take 1 tablet by mouth at bedtime as needed. For sleep    famotidine (PEPCID) 20 MG tablet Take 1 tablet (20 mg total) by mouth 2 (two) times daily. Qty: 60 tablet, Refills: 0    feeding supplement, ENSURE COMPLETE, (ENSURE COMPLETE) LIQD Take 237 mLs by mouth 2 (two) times daily between meals. Qty: 237 mL, Refills: 0    fenofibrate 160 MG tablet TAKE 1 TABLET BY MOUTH ONCE DAILY Qty: 30 tablet, Refills: 4    ferrous sulfate 325 (65 FE) MG tablet TAKE 1 TABLET BY MOUTH TWICE A DAY BEFORE LUNCH AND SUPPER Qty: 60 tablet, Refills: 5    FLUoxetine (PROZAC) 20 MG capsule TAKE 1 CAPSULE BY MOUTH ONCE DAILY Qty: 30 capsule, Refills: 5    levothyroxine (SYNTHROID, LEVOTHROID) 25 MCG tablet Take 1 tablet (25 mcg total) by mouth daily before breakfast. Qty: 30 tablet, Refills: 0    predniSONE (DELTASONE) 5 MG tablet Take 1 tablet (5 mg total) by mouth daily. Qty: 90 tablet, Refills: 3    PROAIR HFA 108 (90 BASE) MCG/ACT inhaler Inhale 1-2 puffs into the lungs every 6 (six) hours as needed for wheezing or shortness of breath.     hyoscyamine (LEVSIN SL) 0.125 MG SL tablet PLACE 1 TABLET UNDER THE  TONGUE EVERY 4 HOURS AS NEEDED. Qty: 30 tablet, Refills: 0      STOP taking these medications     omeprazole (PRILOSEC) 20 MG capsule      potassium chloride SA (K-DUR,KLOR-CON) 20 MEQ tablet      ZETIA 10 MG tablet          Aspirin prescribed at discharge?Yes High Intensity Statin Prescribed? Yes Beta Blocker Prescribed? No, pt. Is bradycardic For EF 45% or less, Was ACEI/ARB Prescribed? Yes ADP Receptor Inhibitor Prescribed? yes For EF <40%, Aldosterone Inhibitor Prescribed? No  Was EF assessed during THIS hospitalization? By cath, echo scheduled 01/08/16 Was Cardiac Rehab II ordered? Yes  Outstanding Labs/Studies  BMP, CBC, hepatic function  Duration of Discharge Encounter   Greater than 30 minutes including physician time.  Signed, Arbutus Leas NP 01/08/2016, 12:03 PM

## 2016-01-08 NOTE — Care Management Note (Signed)
Case Management Note  Patient Details  Name: Monica Copeland MRN: [id number] Date of Birth: 1941-03-10  Subjective/Objective:         Discharge planning           Action/Plan: CM contacted patient and family to discuss discharge recommendations for HHPT services.  Patient and family agreeable to services offered choice. [facility] selected. CM verified patient's contact information. [place] liaison contacted to confirm services. No further question or concerns verbalized. Teach back done. No further CM need identified.  Expected Discharge Date:    01/08/2016             Expected Discharge Plan:  [place]  In-House Referral:     Discharge planning Services  CM Consult  Post Acute Care Choice:    Choice offered to:  Adult Children, Patient  DME Arranged:    DME Agency:  NA  HH Arranged:  PT [place] Agency:  [place]  Status of Service:     Medicare Important Message Given:    Date Medicare IM Given:    Medicare IM give by:    Date Additional Medicare IM Given:    Additional Medicare Important Message give by:     If discussed at [place] of Stay Meetings, dates discussed:    Additional CommentsLaurena Slimmer, RN 01/08/2016, 1:35 PM

## 2016-01-08 NOTE — Progress Notes (Signed)
Patient ID: Monica Copeland, female   DOB: 1940-12-03, 75 y.o.   MRN: [id number]   PATIENT ID: [id number] with hypertension, hyperlipidemia, and ongoing tobacco abuse here with inferior STEMI complicated by symptomatic bradycardia requiring transient external pacing and VF during cath.   SUBJECTIVE:  Doing ok.  SBP 90s-110s, no lightheadedness.  Walked with PT yesterday, recommend home heath/PT.  No chest pain, no dypnea, no further bradycardia.    PHYSICAL EXAM Filed Vitals:   01/07/16 2354 01/08/16 0405 01/08/16 0811 01/08/16 0851  BP: 99/39 124/49 97/47 97/47   Pulse: 65 77 73 67  Temp: 98 F (36.7 C) 97.9 F (36.6 C) 98.2 F (36.8 C)   TempSrc: Oral Oral Oral   Resp: 16 19 23 21   Height:      Weight:  141 lb 1.5 oz (64 kg)    SpO2: 97% 99% 100% 100%   General:  Well-appearing.  No acute distress.  Neck: No JVD Lungs:  CTAB Heart:  RRR.  No m/r/g.  Abdomen:  Soft, NT, ND.  +BS  Extremities:  WWP.  No edema.  R femoral cath site bruising.  No hematoma or bruit.   LABS: Lab Results  Component Value Date   TROPONINI 9.59* 01/06/2016   No results found for this or any previous visit (from the past 24 hour(s)).  Intake/Output Summary (Last 24 hours) at 01/08/16 1106 Last data filed at 01/08/16 0917  Gross per 24 hour  Intake    457 ml  Output    925 ml  Net   -468 ml    Telemetry: sinus rhythm.    Cardiac cath 01/05/2016  Conclusion     Prox RCA to Mid RCA lesion, 99% stenosed. Post intervention, there is a 0% residual stenosis. The lesion was not previously treated.  The left ventricular systolic function is normal.  RPDA lesion, 100% stenosed. Post intervention, there is a 0% residual stenosis.  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   Normal left main, LAD and ramus intermediate vessel arising from the left coronary cusp.  Insertion of a temporary pacemaker for symptomatically bradycardia and hypotension.  Ventricular fibrillation requiring CPR and successful defibrillation to restore sinus rhythm.  Successful PCI to the RCA requiring PTCA, thrombectomy with significant thrombus removal, and ultimate stenting with a 3.024 mm Synergy DES stent postdilated to 3.25 mm with the subtotal stenosis. Reduced to 0 and restoration of TIMI-3 flow throughout the entire dominant RCA distribution.  Anticoagulation utilizing Angiomax, Brilinta 180 mg orally, and intravenous Aggrastat.  RECOMMENDATION: The patient will require dual antiplatelet therapy for minimum of a year. She has been on chronic prednisone and may require several days of increased dose prior to resuming her daily low dose. Looking cessation is imperative.           ASSESSMENT AND PLAN:  Principal Problem:   ST elevation (STEMI) myocardial infarction involving right coronary artery (HCC) Active Problems:   Hyperlipidemia   Cushing syndrome (HCC)   COPD with asthma ([place])   Tobacco use disorder   Knee pain   VF (ventricular fibrillation) (HCC)   Bradycardia   HTN (hypertension)   # CAD s/p inferior STEMI: Stable and chest pain free.  - In transit by  EMS, she became hypotensive and bradycardic and required transient external pacing - cath 01/05/2016 EF 55%, 99% prox to mid RCA treated with PTCA/thrombectomy/3.0x65mm Synergy DES postdilated to 3.72mm, 100% RPDA treated with ?PTCA? - cath complicated by symptomatic bradycardia and hypotension requiring placement of temp pacer and VF requiring CPR and successful defibrillation - continue ASA and brilinta, recent bradycardia likely triggered by RCA stenosis, however will hold off on starting  any BB at this point. Continue high dose statin.              - Will try her on very low dose ACEI, lisinopril 2.5 mg daily.              - Needs BMET/CBC today, will send.    #  VF during cath: see above  #  Cushing's disease on chronic steroid  #  HTN: BP borderline low  #  HLD: Atorvastatin increased to 80 mg   # Tobacco abuse: patient states she has decided to quit after this MI   Dispo: She needs labs drawn (none since 5/11) => CBC/BMET.  If these look ok, think she can go home today.  Will need followup in 10-14 days with cardiology.  She will need home health/PT.  Should have family member stay with her this weekend as she lives at home alone.   Cardiac meds for home: ASA 81, atorvastatin 80, Brilinta 90 bid, lisinopril 2.5 daily.  Continue home prednisone dosing for Cushing's disease.  Loralie Champagne  01/08/2016 11:06 AM

## 2016-01-08 NOTE — Progress Notes (Signed)
*  PRELIMINARY RESULTS* Echocardiogram 2D Echocardiogram has been performed.  Monica Copeland 01/08/2016, 3:37 PM

## 2016-01-10 ENCOUNTER — Telehealth: Payer: Self-pay | Admitting: Family Medicine

## 2016-01-10 ENCOUNTER — Telehealth: Payer: Self-pay | Admitting: *Deleted

## 2016-01-10 NOTE — Telephone Encounter (Signed)
Transition Care Management Follow-up Telephone Call  Discharge Summary   Patient ID: Monica Copeland,  MRN: [id number], DOB/AGE: Feb 21, 1941 75 y.o.  Admit date: 01/05/2016 Discharge date: 01/08/2016  Primary Care Provider: Ann Held Primary Cardiologist: Dr. Claiborne Billings  Discharge Diagnoses   Principal Problem:  ST elevation (STEMI) myocardial infarction involving right coronary artery [facility]) Active Problems:  Hyperlipidemia  Cushing syndrome ([place])  COPD with asthma ([place])  Tobacco use disorder  Knee pain  VF (ventricular fibrillation) (HCC)  Bradycardia  HTN (hypertension)           How have you been since you were released from the hospital? "I'm still weak. I have some pain in my chest. Not really pain, more soreness."   Do you understand why you were in the hospital? yes   Do you understand the discharge instructions? yes   Where were you discharged to? Home   Items Reviewed:  Medications reviewed: yes  Allergies reviewed: yes  Dietary changes reviewed: yes, heart healthy  Referrals reviewed: yes, cardiac rehab   Functional Questionnaire:  Activities of Daily Living (ADLs):   She states they are independent in the following: ambulation, bathing and hygiene, feeding, continence, grooming, toileting and dressing States they require assistance with the following: ambulation -"I can walk, just not very far."   Any transportation issues/concerns?: no, children will drive her to appts   Any patient concerns? no   Confirmed importance and date/time of follow-up visits scheduled yes  Provider Appointment booked with Dr. Roma Schanz 01/21/16 @ 11:30am  Confirmed with patient if condition begins to worsen call PCP or go to the ER.  Patient was given the office number and encouraged to call back with question or concerns.  : yes

## 2016-01-10 NOTE — Telephone Encounter (Signed)
Can be reached: [phone] Pharmacy:     [facility] [place]          Reason for call: Pt states she got Dulera 100mg  inhaler from the hospital. She uses it as many times as she needs to she stated. Pt is asking for a refill to be sent in. Please f/u with pt as needed.

## 2016-01-11 ENCOUNTER — Telehealth: Payer: Self-pay | Admitting: Family Medicine

## 2016-01-11 ENCOUNTER — Telehealth: Payer: Self-pay | Admitting: Endocrinology

## 2016-01-11 ENCOUNTER — Telehealth: Payer: Self-pay | Admitting: Cardiology

## 2016-01-11 DIAGNOSIS — Z720 Tobacco use: Secondary | ICD-10-CM | POA: Diagnosis not present

## 2016-01-11 DIAGNOSIS — R079 Chest pain, unspecified: Secondary | ICD-10-CM | POA: Diagnosis not present

## 2016-01-11 DIAGNOSIS — Z7952 Long term (current) use of systemic steroids: Secondary | ICD-10-CM | POA: Diagnosis not present

## 2016-01-11 DIAGNOSIS — Z7951 Long term (current) use of inhaled steroids: Secondary | ICD-10-CM | POA: Diagnosis not present

## 2016-01-11 DIAGNOSIS — J449 Chronic obstructive pulmonary disease, unspecified: Secondary | ICD-10-CM | POA: Diagnosis not present

## 2016-01-11 DIAGNOSIS — I10 Essential (primary) hypertension: Secondary | ICD-10-CM | POA: Diagnosis not present

## 2016-01-11 DIAGNOSIS — Z7901 Long term (current) use of anticoagulants: Secondary | ICD-10-CM | POA: Diagnosis not present

## 2016-01-11 DIAGNOSIS — E785 Hyperlipidemia, unspecified: Secondary | ICD-10-CM | POA: Diagnosis not present

## 2016-01-11 DIAGNOSIS — I2111 ST elevation (STEMI) myocardial infarction involving right coronary artery: Secondary | ICD-10-CM | POA: Diagnosis not present

## 2016-01-11 DIAGNOSIS — E249 Cushing's syndrome, unspecified: Secondary | ICD-10-CM | POA: Diagnosis not present

## 2016-01-11 NOTE — Telephone Encounter (Signed)
[name] name: Andria Frames  Relation to pt: Physical Therapy  [facility]  Call back number: [phone]   Reason for call:  Requesting verbal orders for skilled nurse consultation. Please advise

## 2016-01-11 NOTE — Telephone Encounter (Signed)
Pt was in hospital and her prednisone was increased to 2 a day instead of one we may need to call in the increased rx to [facility]

## 2016-01-11 NOTE — Telephone Encounter (Signed)
Patient Name: Monica Copeland  DOB: 02/11/1941    Initial Comment [name] states she's having trouble breathing. States she had a heart attack last week.   Nurse Assessment  Nurse: Thad Ranger RN, Denise Date/Time (Eastern Time): 01/11/2016 2:30:18 PM  Confirm and document reason for call. If symptomatic, describe symptoms. You must click the next button to save text entered. ---[name] states she's having trouble breathing. States she had a heart attack last week.  Has the patient traveled out of the country within the last 30 days? ---Not Applicable  Does the patient have any new or worsening symptoms? ---Yes  Will a triage be completed? ---Yes  Related visit to physician within the last 2 weeks? ---Yes  Does the PT have any chronic conditions? (i.e. diabetes, asthma, etc.) ---Yes  List chronic conditions. ---CAD, MI  Is this a behavioral health or substance abuse call? ---No     Guidelines    Guideline Title Affirmed Question Affirmed Notes  Breathing Difficulty [1] MODERATE difficulty breathing (e.g., speaks in phrases, SOB even at rest, pulse 100-120) AND [2] NEW-onset or WORSE than normal    Final Disposition User   Go to ED Now [place], RN, Langley Gauss    Comments  Pt states she does not have transportation to the hospital ER. Advised to call 911 and will cb to ck on her soon.   Referrals  GO TO FACILITY UNDECIDED   Disagree/Comply: Comply

## 2016-01-11 NOTE — Telephone Encounter (Signed)
D/c summary says take 5 mg qd. That is the amount you should take for the pituitary If you need a higher amount for another reason, please ask your PCP.

## 2016-01-11 NOTE — Telephone Encounter (Signed)
Spoke w/ pt. She is currently not experiencing CP or SOB, but has been experiencing some CP and SOB intermittently since d/c from hospital. She called 911 as directed and was evaluated by paramedics. Her and her son both state that they said nothing was wrong with her and that she is not having a heart attack. They did not transport her to hospital. She is requesting a refill of Dulera inhaler for SOB. States the last time she needed to use it was 4-5 days ago, and she is completely out. Advised pt that given her symptoms and recent hospitalization, she is at risk for another heart attack, and again advised ED eval. Pt declined stating that she didn't have a ride to the hospital and asked if she needed to go today. Advised yes, and offered to call 911 for pt, she declined. Also spoke w/ pt's son and advised him of same. I told him I would pass along information to Dr. Etter Sjogren, but that I was doubtful she would be willing to prescribe medication without seeing the pt, at which point pt's son disconnected the call.   Discussed w/ Dr. Etter Sjogren, she agrees no refill of PRN inhalers that have been prescribed since 2014 w/o OV. Attempted to return call to pt, line is busy at time of call. Will attempt to call back later.

## 2016-01-11 NOTE — Telephone Encounter (Signed)
See previous phone note dated for same. Per Dr. Etter Sjogren, no refills w/o OV or ED eval as advised by [facility].

## 2016-01-11 NOTE — Telephone Encounter (Signed)
See previous phone notes dated for same. Called pt, explained that Dr. Carollee Herter will not refill her inhaler that has not been filled since 2014 for acute SOB s/p hospital d/c on 01/08/16 for STEMI without an OV. Offered pt an appt in office tomorrow w/ another provider, she declined. Spoke w/ pt's daughter, who stated, "She's not going to the ED just because she's short of breath because she's out her inhaler. I guess she'll just have to do without it until her appt on the 26th, and if something happens to her, then it just does." Eventually was able to schedule pt for appt 01/13/16 at 4pm w/ Dr. Carollee Herter. Pt declined earlier appt d/t only wanting to see PCP and having [place] visit scheduled in AM of 01/13/16.

## 2016-01-11 NOTE — Telephone Encounter (Signed)
See note below and please advise, thanks!  

## 2016-01-11 NOTE — Telephone Encounter (Signed)
Patient contacted regarding discharge from [facility] on 01/08/16.  Patient understands to follow up with provider Lyda Jester, PA on 01/19/16 at [place] at [facility]. Patient understands discharge instructions? no Patient understands medications and regiment? no Patient understands to bring all medications to this visit? Yes  Asked patient several times if she had any questions or if she needed me to explain the discharge summary/medications. Patient said no.

## 2016-01-11 NOTE — Telephone Encounter (Signed)
Relation to [id number] Call back [place]: [place], [place] - [place] [phone] (Phone) [phone] (Fax)         Reason for call:   patient requesting a refill "dulera inhaler", does not reflect patient chart. Please advise

## 2016-01-11 NOTE — Telephone Encounter (Signed)
i agree with er since pt just d/c from hospital s/p stemi----- i'm not comfortable just calling in inhaler without seeing the patient

## 2016-01-11 NOTE — Telephone Encounter (Signed)
[name] name: Ronalee Belts @ [facility] Can be reached: [phone]   Reason for call: Pt just called for proair rescue inhaler that was prescribed 2 years ago by Dr. Alta Corning. Please send in urgent RX. Pt did not go to ER.

## 2016-01-11 NOTE — Telephone Encounter (Signed)
New message       TCM appt on 01-19-16 with M[facility] Loan per Junie Panning Smith--pt saw Dr Claiborne Billings in [place]

## 2016-01-12 NOTE — Telephone Encounter (Signed)
I contacted the pt advised of note below. Pt voiced understanding.

## 2016-01-12 NOTE — Telephone Encounter (Signed)
Called and spoke with Andria Frames and per Dr. Etter Sjogren okay to give verbal orders for skillled nurse consultation.//AB/CMA

## 2016-01-12 NOTE — Telephone Encounter (Signed)
See 01/11/16 phone notes.

## 2016-01-13 ENCOUNTER — Encounter: Payer: Self-pay | Admitting: Family Medicine

## 2016-01-13 ENCOUNTER — Ambulatory Visit (INDEPENDENT_AMBULATORY_CARE_PROVIDER_SITE_OTHER): Payer: Medicare Other | Admitting: Family Medicine

## 2016-01-13 VITALS — BP 110/58 | HR 72 | Temp 98.2°F | Wt 136.2 lb

## 2016-01-13 DIAGNOSIS — J441 Chronic obstructive pulmonary disease with (acute) exacerbation: Secondary | ICD-10-CM

## 2016-01-13 DIAGNOSIS — M1711 Unilateral primary osteoarthritis, right knee: Secondary | ICD-10-CM | POA: Diagnosis not present

## 2016-01-13 MED ORDER — ALBUTEROL SULFATE HFA 108 (90 BASE) MCG/ACT IN AERS: 1.0000 | INHALATION_SPRAY | Freq: Four times a day (QID) | RESPIRATORY_TRACT | Status: AC | PRN

## 2016-01-13 MED ORDER — DICLOFENAC SODIUM 1 % TD GEL: 4.0000 g | Freq: Four times a day (QID) | TRANSDERMAL | Status: AC

## 2016-01-13 MED ORDER — IPRATROPIUM-ALBUTEROL 0.5-2.5 (3) MG/3ML IN SOLN
3.0000 mL | Freq: Once | RESPIRATORY_TRACT | Status: AC
  Administered 2016-01-13: 3 mL via RESPIRATORY_TRACT

## 2016-01-13 MED ORDER — IPRATROPIUM-ALBUTEROL 0.5-2.5 (3) MG/3ML IN SOLN: 3.0000 mL | Freq: Four times a day (QID) | RESPIRATORY_TRACT | Status: DC

## 2016-01-13 MED ORDER — BUDESONIDE-FORMOTEROL FUMARATE 80-4.5 MCG/ACT IN AERO: 2.0000 | INHALATION_SPRAY | Freq: Every day | RESPIRATORY_TRACT | Status: AC | PRN

## 2016-01-13 NOTE — Patient Instructions (Signed)
Chronic Obstructive Pulmonary Disease Chronic obstructive pulmonary disease (COPD) is a common lung condition in which airflow from the lungs is limited. COPD is a general term that can be used to describe many different lung problems that limit airflow, including both chronic bronchitis and emphysema. If you have COPD, your lung function will probably never return to normal, but there are measures you can take to improve lung function and make yourself feel better. CAUSES   Smoking (common).  Exposure to secondhand smoke.  Genetic problems.  Chronic inflammatory lung diseases or recurrent infections. SYMPTOMS  Shortness of breath, especially with physical activity.  Deep, persistent (chronic) cough with a large amount of thick mucus.  Wheezing.  Rapid breaths (tachypnea).  Gray or bluish discoloration (cyanosis) of the skin, especially in your fingers, toes, or lips.  Fatigue.  Weight loss.  Frequent infections or episodes when breathing symptoms become much worse (exacerbations).  Chest tightness. DIAGNOSIS Your health care provider will take a medical history and perform a physical examination to diagnose COPD. Additional tests for COPD may include:  Lung (pulmonary) function tests.  Chest X-ray.  CT scan.  Blood tests. TREATMENT  Treatment for COPD may include:  Inhaler and nebulizer medicines. These help manage the symptoms of COPD and make your breathing more comfortable.  Supplemental oxygen. Supplemental oxygen is only helpful if you have a low oxygen level in your blood.  Exercise and physical activity. These are beneficial for nearly all people with COPD.  Lung surgery or transplant.  Nutrition therapy to gain weight, if you are underweight.  Pulmonary rehabilitation. This may involve working with a team of health care providers and specialists, such as respiratory, occupational, and physical therapists. HOME CARE INSTRUCTIONS  Take all medicines  (inhaled or pills) as directed by your health care provider.  Avoid over-the-counter medicines or cough syrups that dry up your airway (such as antihistamines) and slow down the elimination of secretions unless instructed otherwise by your health care provider.  If you are a smoker, the most important thing that you can do is stop smoking. Continuing to smoke will cause further lung damage and breathing trouble. Ask your health care provider for help with quitting smoking. He or she can direct you to community resources or hospitals that provide support.  Avoid exposure to irritants such as smoke, chemicals, and fumes that aggravate your breathing.  Use oxygen therapy and pulmonary rehabilitation if directed by your health care provider. If you require home oxygen therapy, ask your health care provider whether you should purchase a pulse oximeter to measure your oxygen level at home.  Avoid contact with individuals who have a contagious illness.  Avoid extreme temperature and humidity changes.  Eat healthy foods. Eating smaller, more frequent meals and resting before meals may help you maintain your strength.  Stay active, but balance activity with periods of rest. Exercise and physical activity will help you maintain your ability to do things you want to do.  Preventing infection and hospitalization is very important when you have COPD. Make sure to receive all the vaccines your health care provider recommends, especially the pneumococcal and influenza vaccines. Ask your health care provider whether you need a pneumonia vaccine.  Learn and use relaxation techniques to manage stress.  Learn and use controlled breathing techniques as directed by your health care provider. Controlled breathing techniques include:  Pursed lip breathing. Start by breathing in (inhaling) through your nose for 1 second. Then, purse your lips as if you were   going to whistle and breathe out (exhale) through the  pursed lips for 2 seconds.  Diaphragmatic breathing. Start by putting one hand on your abdomen just above your waist. Inhale slowly through your nose. The hand on your abdomen should move out. Then purse your lips and exhale slowly. You should be able to feel the hand on your abdomen moving in as you exhale.  Learn and use controlled coughing to clear mucus from your lungs. Controlled coughing is a series of short, progressive coughs. The steps of controlled coughing are: 1. Lean your head slightly forward. 2. Breathe in deeply using diaphragmatic breathing. 3. Try to hold your breath for 3 seconds. 4. Keep your mouth slightly open while coughing twice. 5. Spit any mucus out into a tissue. 6. Rest and repeat the steps once or twice as needed. SEEK MEDICAL CARE IF:  You are coughing up more mucus than usual.  There is a change in the color or thickness of your mucus.  Your breathing is more labored than usual.  Your breathing is faster than usual. SEEK IMMEDIATE MEDICAL CARE IF:  You have shortness of breath while you are resting.  You have shortness of breath that prevents you from:  Being able to talk.  Performing your usual physical activities.  You have chest pain lasting longer than 5 minutes.  Your skin color is more cyanotic than usual.  You measure low oxygen saturations for longer than 5 minutes with a pulse oximeter. MAKE SURE YOU:  Understand these instructions.  Will watch your condition.  Will get help right away if you are not doing well or get worse.   This information is not intended to replace advice given to you by your health care provider. Make sure you discuss any questions you have with your health care provider.   Document Released: 05/24/2005 Document Revised: 09/04/2014 Document Reviewed: 04/10/2013 Elsevier Interactive Patient Education 2016 Elsevier Inc.  

## 2016-01-13 NOTE — Progress Notes (Signed)
Pre visit review using our clinic review tool, if applicable. No additional management support is needed unless otherwise documented below in the visit note. 

## 2016-01-13 NOTE — Progress Notes (Signed)
Patient ID: Monica Copeland, female    DOB: 08-18-1941  Age: 75 y.o. MRN: [id number]    Subjective:  Subjective HPI JENNIGER LIGGON presents for f/u hosp for MI.  Pt has been having a hard time with sob because of the hot weather.  Hospital d/c her with albuterol inh.  Her symbicort ran out.  She has quit smoking since being in the hospital.    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   Review of Systems  Constitutional: Negative for fever and chills.  HENT: Negative for congestion, postnasal drip, rhinorrhea and sinus pressure.   Respiratory: Positive for cough, chest tightness, shortness of breath and wheezing.   Cardiovascular: Negative for chest pain, palpitations and leg swelling.  Allergic/Immunologic: Negative for environmental allergies.    History Past Medical History  Diagnosis Date  . Hypertension   . Hyperlipemia   . Asthma   . Diabetes mellitus without complication ([place])   . Asthma   . Back pain   . Seizures ([place])   . Anxiety   . 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 GERD (gastroesophageal reflux disease) 01/24/2012  . Emphysema of lung ([place])   . Hyperlipidemia   . Ulcer   . Hemorrhoid   . IBS (irritable bowel syndrome)   . Iron deficiency anemia   . Gastric AVM     She has past surgical history that includes Brain surgery (1987); Colon surgery; Appendectomy; candida esophagitis; pylorus ulcer; Abdominal hysterectomy; Cardiac catheterization (N/A, 01/05/2016); Cardiac catheterization (N/A, 01/05/2016); and Cardiac catheterization (N/A, 01/05/2016).   Her family history includes Asthma in her father and son; Diabetes in her father; Esophageal cancer in her brother; Heart attack in her mother; Hypertension in her brother and mother; Liver cancer in her brother; Lung cancer in her father. There is no history of Colon cancer.She reports that she has quit smoking. Her smoking use included Cigarettes. She smoked 0.00 packs per day.  She has never used smokeless tobacco. She reports that she does not drink alcohol or use illicit drugs.  Current Outpatient Prescriptions on File Prior to Visit  Medication Sig Dispense Refill  . ALPRAZolam (XANAX) 0.5 MG tablet TAKE 1/2 TO 1 TABLET BY MOUTH 2 TIMES A DAY AS NEEDED FOR ANXIETY. 60 tablet 0  . aspirin EC 81 MG EC tablet Take 1 tablet (81 mg total) by mouth daily.    . diphenhydramine-acetaminophen (TYLENOL PM) 25-500 MG TABS tablet Take 1 tablet by mouth at bedtime as needed. For sleep    . famotidine (PEPCID) 20 MG tablet Take 1 tablet (20 mg total) by mouth 2 (two) times daily. 60 tablet 0  . feeding supplement, ENSURE COMPLETE, (ENSURE COMPLETE) LIQD Take 237 mLs by mouth 2 (two) times daily between meals. 237 mL 0  . ferrous sulfate 325 (65 FE) MG tablet TAKE 1 TABLET BY MOUTH TWICE A DAY BEFORE LUNCH AND SUPPER 60 tablet 5  . FLUoxetine (PROZAC) 20 MG capsule TAKE 1 CAPSULE BY MOUTH ONCE DAILY 30 capsule 5  . hyoscyamine (LEVSIN SL) 0.125 MG SL tablet PLACE 1 TABLET UNDER THE TONGUE EVERY 4 HOURS AS NEEDED. 30 tablet 0  . levothyroxine (SYNTHROID, LEVOTHROID) 25 MCG tablet Take 1 tablet (25 mcg total) by mouth daily before breakfast. 30 tablet 0  . lisinopril (PRINIVIL,ZESTRIL) 2.5 MG tablet Take 1 tablet (2.5 mg total) by mouth daily. 30 tablet 12  . pantoprazole (PROTONIX) 40 MG tablet  Take 1 tablet (40 mg total) by mouth daily. 30 tablet 12  . predniSONE (DELTASONE) 5 MG tablet Take 1 tablet (5 mg total) by mouth daily. 90 tablet 3  . ticagrelor (BRILINTA) 90 MG TABS tablet Take 1 tablet (90 mg total) by mouth 2 (two) times daily. 60 tablet 12   No current facility-administered medications on file prior to visit.     Objective:  Objective Physical Exam  Constitutional: She is oriented to person, place, and time. She appears well-developed and well-nourished.  HENT:  Head: Normocephalic and atraumatic.  Eyes: Conjunctivae and EOM are normal.  Neck: Normal range of  motion. Neck supple. No JVD present. Carotid bruit is not present. No thyromegaly present.  Cardiovascular: Normal rate, regular rhythm and normal heart sounds.   No murmur heard. Pulmonary/Chest: Effort normal. No respiratory distress. She has decreased breath sounds. She has wheezes. She has no rales. She exhibits no tenderness.  Musculoskeletal: She exhibits no edema.  Neurological: She is alert and oriented to person, place, and time.  Psychiatric: She has a normal mood and affect.  Nursing note and vitals reviewed.  BP 110/58 mmHg  Pulse 72  Temp(Src) 98.2 F (36.8 C) (Oral)  Wt 136 lb 3.2 oz (61.78 kg)  SpO2 98% Wt Readings from Last 3 Encounters:  01/13/16 136 lb 3.2 oz (61.78 kg)  01/08/16 141 lb 1.5 oz (64 kg)  10/13/15 132 lb (59.875 kg)     Lab Results  Component Value Date   WBC 17.4* 01/08/2016   HGB 8.9* 01/08/2016   HCT 27.8* 01/08/2016   PLT 214 01/08/2016   GLUCOSE 127* 01/08/2016   CHOL 152 01/05/2016   TRIG 144 01/05/2016   HDL 34* 01/05/2016   LDLDIRECT 128.6 05/28/2014   LDLCALC 89 01/05/2016   ALT 19 01/05/2016   AST 21 01/05/2016   NA 138 01/08/2016   K 3.5 01/08/2016   CL 104 01/08/2016   CREATININE 1.47* 01/08/2016   BUN 27* 01/08/2016   CO2 23 01/08/2016   TSH 1.372 01/05/2016   INR 1.07 01/05/2016   HGBA1C 6.1* 01/05/2016   MICROALBUR 0.9 12/07/2014    Dg Chest Port 1 View  01/05/2016  CLINICAL DATA:  Chest pain and shortness of breath for 1 day. Initial encounter. EXAM: PORTABLE CHEST 1 VIEW COMPARISON:  Single-view of the chest 06/23/2015. PA and lateral chest 08/04/2014. FINDINGS: Heart size is mildly enlarged. Lungs are clear. No pneumothorax or pleural effusion. IMPRESSION: No acute disease. Electronically Signed   By: Inge Rise M.D.   On: 01/05/2016 16:06     Assessment & Plan:  Plan I have changed Ms. Buchberger's PROAIR HFA to albuterol. I am also having her start on diclofenac sodium. Additionally, I am having her maintain  her feeding supplement (ENSURE COMPLETE), levothyroxine, famotidine, ferrous sulfate, FLUoxetine, hyoscyamine, predniSONE, ALPRAZolam, diphenhydramine-acetaminophen, aspirin, lisinopril, pantoprazole, ticagrelor, and budesonide-formoterol. We administered ipratropium-albuterol.  Meds ordered this encounter  Medications  . albuterol (PROAIR HFA) 108 (90 Base) MCG/ACT inhaler    Sig: Inhale 1-2 puffs into the lungs every 6 (six) hours as needed for wheezing or shortness of breath.    Dispense:  1 Inhaler    Refill:  3  . budesonide-formoterol (SYMBICORT) 80-4.5 MCG/ACT inhaler    Sig: Inhale 2 puffs into the lungs daily as needed. For shortness of breath    Dispense:  1 Inhaler    Refill:  3  . diclofenac sodium (VOLTAREN) 1 % GEL    Sig: Apply 4  g topically 4 (four) times daily.    Dispense:  100 g    Refill:  3  . DISCONTD: ipratropium-albuterol (DUONEB) 0.5-2.5 (3) MG/3ML nebulizer solution 3 mL    Sig:   . ipratropium-albuterol (DUONEB) 0.5-2.5 (3) MG/3ML nebulizer solution 3 mL    Sig:     Problem List Items Addressed This Visit    None    Visit Diagnoses    COPD exacerbation (HCC)    -  Primary    Relevant Medications    albuterol (PROAIR HFA) 108 (90 Base) MCG/ACT inhaler    budesonide-formoterol (SYMBICORT) 80-4.5 MCG/ACT inhaler    ipratropium-albuterol (DUONEB) 0.5-2.5 (3) MG/3ML nebulizer solution 3 mL (Completed)    Primary osteoarthritis of right knee        Relevant Medications    diclofenac sodium (VOLTAREN) 1 % GEL       Follow-up: Return in about 3 months (around 04/14/2016), or if symptoms worsen or fail to improve, for F/U.  Ann Held, DO

## 2016-01-14 ENCOUNTER — Other Ambulatory Visit: Payer: Self-pay | Admitting: Family Medicine

## 2016-01-14 DIAGNOSIS — I2111 ST elevation (STEMI) myocardial infarction involving right coronary artery: Secondary | ICD-10-CM | POA: Diagnosis not present

## 2016-01-14 DIAGNOSIS — Z7901 Long term (current) use of anticoagulants: Secondary | ICD-10-CM | POA: Diagnosis not present

## 2016-01-14 DIAGNOSIS — Z7952 Long term (current) use of systemic steroids: Secondary | ICD-10-CM | POA: Diagnosis not present

## 2016-01-14 DIAGNOSIS — E249 Cushing's syndrome, unspecified: Secondary | ICD-10-CM | POA: Diagnosis not present

## 2016-01-14 DIAGNOSIS — Z720 Tobacco use: Secondary | ICD-10-CM | POA: Diagnosis not present

## 2016-01-14 DIAGNOSIS — J449 Chronic obstructive pulmonary disease, unspecified: Secondary | ICD-10-CM | POA: Diagnosis not present

## 2016-01-14 DIAGNOSIS — Z7951 Long term (current) use of inhaled steroids: Secondary | ICD-10-CM | POA: Diagnosis not present

## 2016-01-14 DIAGNOSIS — E785 Hyperlipidemia, unspecified: Secondary | ICD-10-CM | POA: Diagnosis not present

## 2016-01-14 DIAGNOSIS — I10 Essential (primary) hypertension: Secondary | ICD-10-CM | POA: Diagnosis not present

## 2016-01-16 DIAGNOSIS — I10 Essential (primary) hypertension: Secondary | ICD-10-CM | POA: Diagnosis not present

## 2016-01-16 DIAGNOSIS — I2111 ST elevation (STEMI) myocardial infarction involving right coronary artery: Secondary | ICD-10-CM | POA: Diagnosis not present

## 2016-01-16 DIAGNOSIS — Z7901 Long term (current) use of anticoagulants: Secondary | ICD-10-CM | POA: Diagnosis not present

## 2016-01-16 DIAGNOSIS — E249 Cushing's syndrome, unspecified: Secondary | ICD-10-CM | POA: Diagnosis not present

## 2016-01-16 DIAGNOSIS — Z7952 Long term (current) use of systemic steroids: Secondary | ICD-10-CM | POA: Diagnosis not present

## 2016-01-16 DIAGNOSIS — E785 Hyperlipidemia, unspecified: Secondary | ICD-10-CM | POA: Diagnosis not present

## 2016-01-16 DIAGNOSIS — J449 Chronic obstructive pulmonary disease, unspecified: Secondary | ICD-10-CM | POA: Diagnosis not present

## 2016-01-16 DIAGNOSIS — Z7951 Long term (current) use of inhaled steroids: Secondary | ICD-10-CM | POA: Diagnosis not present

## 2016-01-16 DIAGNOSIS — Z720 Tobacco use: Secondary | ICD-10-CM | POA: Diagnosis not present

## 2016-01-17 ENCOUNTER — Encounter (HOSPITAL_COMMUNITY): Admission: RE | Payer: Self-pay | Source: Ambulatory Visit

## 2016-01-17 ENCOUNTER — Encounter: Payer: Self-pay | Admitting: Cardiology

## 2016-01-17 ENCOUNTER — Inpatient Hospital Stay (HOSPITAL_COMMUNITY): Admission: RE | Admit: 2016-01-17 | Payer: Medicare Other | Source: Ambulatory Visit | Admitting: Orthopedic Surgery

## 2016-01-17 DIAGNOSIS — I2111 ST elevation (STEMI) myocardial infarction involving right coronary artery: Secondary | ICD-10-CM | POA: Diagnosis not present

## 2016-01-17 DIAGNOSIS — Z7901 Long term (current) use of anticoagulants: Secondary | ICD-10-CM | POA: Diagnosis not present

## 2016-01-17 DIAGNOSIS — Z720 Tobacco use: Secondary | ICD-10-CM | POA: Diagnosis not present

## 2016-01-17 DIAGNOSIS — Z7952 Long term (current) use of systemic steroids: Secondary | ICD-10-CM | POA: Diagnosis not present

## 2016-01-17 DIAGNOSIS — J449 Chronic obstructive pulmonary disease, unspecified: Secondary | ICD-10-CM | POA: Diagnosis not present

## 2016-01-17 DIAGNOSIS — I10 Essential (primary) hypertension: Secondary | ICD-10-CM | POA: Diagnosis not present

## 2016-01-17 DIAGNOSIS — Z7951 Long term (current) use of inhaled steroids: Secondary | ICD-10-CM | POA: Diagnosis not present

## 2016-01-17 DIAGNOSIS — E249 Cushing's syndrome, unspecified: Secondary | ICD-10-CM | POA: Diagnosis not present

## 2016-01-17 DIAGNOSIS — E785 Hyperlipidemia, unspecified: Secondary | ICD-10-CM | POA: Diagnosis not present

## 2016-01-17 SURGERY — ARTHROPLASTY, KNEE, TOTAL
Anesthesia: General | Laterality: Right

## 2016-01-19 ENCOUNTER — Encounter: Payer: Self-pay | Admitting: Physician Assistant

## 2016-01-19 ENCOUNTER — Ambulatory Visit (INDEPENDENT_AMBULATORY_CARE_PROVIDER_SITE_OTHER): Payer: Medicare Other | Admitting: Physician Assistant

## 2016-01-19 VITALS — BP 108/62 | HR 67 | Ht 59.0 in | Wt 138.8 lb

## 2016-01-19 DIAGNOSIS — E785 Hyperlipidemia, unspecified: Secondary | ICD-10-CM | POA: Diagnosis not present

## 2016-01-19 DIAGNOSIS — I213 ST elevation (STEMI) myocardial infarction of unspecified site: Secondary | ICD-10-CM

## 2016-01-19 DIAGNOSIS — R001 Bradycardia, unspecified: Secondary | ICD-10-CM | POA: Diagnosis not present

## 2016-01-19 LAB — HEPATIC FUNCTION PANEL
ALT: 22 U/L (ref 6–29)
AST: 25 U/L (ref 10–35)
Albumin: 4 g/dL (ref 3.6–5.1)
Alkaline Phosphatase: 45 U/L (ref 33–130)
BILIRUBIN DIRECT: 0.2 mg/dL (ref <=0.2)
BILIRUBIN TOTAL: 0.7 mg/dL (ref 0.2–1.2)
Indirect Bilirubin: 0.5 mg/dL (ref 0.2–1.2)
Total Protein: 6.6 g/dL (ref 6.1–8.1)

## 2016-01-19 LAB — LIPID PANEL
CHOL/HDL RATIO: 3.3 ratio (ref <=5.0)
CHOLESTEROL: 174 mg/dL (ref 125–200)
HDL: 52 mg/dL (ref >=46)
LDL Cholesterol: 90 mg/dL (ref <130)
Triglycerides: 159 mg/dL — ABNORMAL HIGH (ref <150)
VLDL: 32 mg/dL — AB (ref <30)

## 2016-01-19 NOTE — Addendum Note (Signed)
Addended by: Eulis Foster on: 01/19/2016 11:36 AM   Modules accepted: Orders

## 2016-01-19 NOTE — Progress Notes (Signed)
Patient ID: Monica Copeland, female   DOB: 1940/10/19, 75 y.o.   MRN: [id number]     Cardiology Office Note    Date:  01/19/2016   ID:  Monica Copeland, DOB May 09, 1941, MRN [id number]  PCP:  Ann Held, DO  Cardiologist:  Dr. Claiborne Billings  Chief complaint: hospital follow up s/p STEMI   History of Present Illness:  Monica Copeland is a 75 y.o. female CAD,HTN, Cushing's disease, HLD, DM, GERD and prior  tobacco abuse who came to clinic for hospital follow up.   She presented to the ED on 5/10/17with chest pain. She had ST elevation in her inferior leads, code STEMI was called and he was taken urgently to the cath lab which showed EF 55%, 99% prox to mid RCA treated with PTCA/thrombectomy/3.0x61mm Synergy DES postdilated to 3.48mm, 100% RPDA treated with ?PTCA?Marland Kitchen Cath complicated by symptomatic bradycardia and hypotension requiring placement of temp pacer and VF requiring CPR and successful defibrillation. Her  temporary wire was discontinued. Echo showed LV EF of 60-65%.  discharged home on stable condition.   The patient was seen by PCP 01/13/16 for SOB and wheezing. Her Proair HFA changed to albuterol and given refill rx of symbicort. She was also started on Voltaren gel at that time as her R knee replacement has been  cancelled secondary to the MI and she was in a lot of pain.   She presenting to clinic today for hospital follow up. The patient denies chest pain, palpitations, shortness of breath, orthopnea, PND, dizziness, syncope, cough, congestion, abdominal pain, hematochezia, melena, lower extremity edema. Ambulating well. No complains. Her only complains is R knee pain which is proving with gel.    Past Medical History  Diagnosis Date  . Hypertension   . Hyperlipemia   . Asthma   . Diabetes mellitus without complication ([facility])   . Asthma   . Back pain   . Seizures ([place])   . Anxiety   . 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 GERD (gastroesophageal reflux disease) 01/24/2012  .  Emphysema of lung ([place])   . Hyperlipidemia   . Ulcer   . Hemorrhoid   . IBS (irritable bowel syndrome)   . Iron deficiency anemia   . Gastric AVM     Past Surgical History  Procedure Laterality Date  . 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 Colon surgery      colon perforation  . Appendectomy    . Candida esophagitis    . Pylorus ulcer    . [place]dominal [place]  . Cardiac catheterization N/A 01/05/2016    Procedure: Left Heart Cath and Coronary Angiography;  Surgeon: Troy Sine, MD;  Location: [facility];  Service: Cardiovascular;  Laterality: N/A;  . Cardiac catheterization N/A 01/05/2016    Procedure: Coronary Stent Intervention;  Surgeon: Troy Sine, MD;  Location: [facility];  Service: Cardiovascular;  Laterality: N/A;  . Cardiac catheterization N/A 01/05/2016    Procedure: Temporary Pacemaker;  Surgeon: Troy Sine, MD;  Location: [facility];  Service: Cardiovascular;  Laterality: N/A;    Current Medications: Outpatient Prescriptions Prior to Visit  Medication Sig Dispense Refill  . albuterol (PROAIR HFA) 108 (90 Base) MCG/ACT inhaler Inhale 1-2 puffs into the lungs every 6 (six) hours as needed for wheezing or shortness of breath. 1 Inhaler 3  . ALPRAZolam (XANAX) 0.5 MG tablet TAKE 1/2 TO 1  TABLET BY MOUTH 2 TIMES A DAY AS NEEDED FOR ANXIETY. 60 tablet 0  . aspirin EC 81 MG EC tablet Take 1 tablet (81 mg total) by mouth daily.    Marland Kitchen atorvastatin (LIPITOR) 40 MG tablet TAKE 1 TABLET BY MOUTH ONCE DAILY 30 tablet 3  . budesonide-formoterol (SYMBICORT) 80-4.5 MCG/ACT inhaler Inhale 2 puffs into the lungs daily as needed. For shortness of breath 1 Inhaler 3  . diclofenac sodium (VOLTAREN) 1 % GEL Apply 4 g topically 4 (four) times daily. 100 g 3  . diphenhydramine-acetaminophen (TYLENOL PM) 25-500 MG TABS tablet Take 1 tablet by mouth at bedtime as needed. For sleep    . famotidine (PEPCID) 20 MG tablet Take 1 tablet (20 mg total)  by mouth 2 (two) times daily. 60 tablet 0  . feeding supplement, ENSURE COMPLETE, (ENSURE COMPLETE) LIQD Take 237 mLs by mouth 2 (two) times daily between meals. 237 mL 0  . fenofibrate 160 MG tablet TAKE 1 TABLET BY MOUTH ONCE DAILY 30 tablet 5  . ferrous sulfate 325 (65 FE) MG tablet TAKE 1 TABLET BY MOUTH TWICE A DAY BEFORE LUNCH AND SUPPER 60 tablet 5  . FLUoxetine (PROZAC) 20 MG capsule TAKE 1 CAPSULE BY MOUTH ONCE DAILY 30 capsule 5  . hyoscyamine (LEVSIN SL) 0.125 MG SL tablet PLACE 1 TABLET UNDER THE TONGUE EVERY 4 HOURS AS NEEDED. 30 tablet 0  . levothyroxine (SYNTHROID, LEVOTHROID) 25 MCG tablet Take 1 tablet (25 mcg total) by mouth daily before breakfast. 30 tablet 0  . lisinopril (PRINIVIL,ZESTRIL) 2.5 MG tablet Take 1 tablet (2.5 mg total) by mouth daily. 30 tablet 12  . pantoprazole (PROTONIX) 40 MG tablet Take 1 tablet (40 mg total) by mouth daily. 30 tablet 12  . predniSONE (DELTASONE) 5 MG tablet Take 1 tablet (5 mg total) by mouth daily. 90 tablet 3  . ticagrelor (BRILINTA) 90 MG TABS tablet Take 1 tablet (90 mg total) by mouth 2 (two) times daily. 60 tablet 12   No facility-administered medications prior to visit.     Allergies:   Tomato   Social History   Social History  . Marital Status: Widowed    Spouse Name: N/A  . Number of Children: N/A  . Years of Education: N/A   Social History Main Topics  . Smoking status: Former Smoker -- 0.00 packs/day    Types: Cigarettes  . Smokeless tobacco: Never Used     Comment: Started smoking at age 83.form given 04-02-13  . Alcohol Use: No  . Drug Use: No  . Sexual Activity: Not Asked   Other Topics Concern  . None   Social History Narrative   ** Merged History Encounter **         Family History:  The patient's family history includes Asthma in her father and son; Diabetes in her father; Esophageal cancer in her brother; Heart attack in her mother; Hypertension in her brother and mother; Liver cancer in her brother;  Lung cancer in her father. There is no history of Colon cancer.   ROS:   Please see the history of present illness.    Review of Systems  Musculoskeletal: Positive for joint pain.   All other systems reviewed and are negative.   PHYSICAL EXAM:   VS:  BP 108/62 mmHg  Pulse 67  Ht 4\' 11"  (1.499 m)  Wt 138 lb 12.8 oz (62.959 kg)  BMI 28.02 kg/m2  SpO2 99%   GEN: Well nourished, well developed, in no  acute distress HEENT: normal Neck: no JVD, carotid bruits, or masses Cardiac:  RRR; no murmurs, rubs, or gallops,no edema  Respiratory: normal work of breathing, Diffuse wheezing throught GI: soft, nontender, nondistended, + BS MS: no deformity or atrophy Skin: warm and dry, no rash Neuro:  Alert and Oriented x 3, Strength and sensation are intact Psych: euthymic mood, full affect  Wt Readings from Last 3 Encounters:  01/19/16 138 lb 12.8 oz (62.959 kg)  01/13/16 136 lb 3.2 oz (61.78 kg)  01/08/16 141 lb 1.5 oz (64 kg)      Studies/Labs Reviewed:   EKG:  EKG is ordered today.  The ekg ordered today demonstrates NSR. 67bpm.   Recent Labs: 01/05/2016: ALT 19; Magnesium 1.7; TSH 1.372 01/08/2016: BUN 27*; Creatinine, Ser 1.47*; Hemoglobin 8.9*; Platelets 214; Potassium 3.5; Sodium 138   Lipid Panel    Component Value Date/Time   CHOL 152 01/05/2016 1140   TRIG 144 01/05/2016 1140   HDL 34* 01/05/2016 1140   CHOLHDL 4.5 01/05/2016 1140   VLDL 29 01/05/2016 1140   LDLCALC 89 01/05/2016 1140   LDLDIRECT 128.6 05/28/2014 1713    Additional studies/ records that were reviewed today include:   Echo 01/08/16:  LV EF of 60-65%, no WM abnormality, PA peak pressure of 32 mm HG.   Coronary Stent Intervention 01/05/16 Left Heart Cath and Coronary Angiography Temporary Pacemaker     Prox RCA to Mid RCA lesion, 99% stenosed. Post intervention, there is a 0% residual stenosis. The lesion was not previously treated.  The left ventricular systolic function is normal.  RPDA  lesion, 100% stenosed. Post intervention, there is a 0% residual stenosis.  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   Normal left main, LAD and ramus intermediate vessel arising from the left coronary cusp.  Insertion of a temporary pacemaker for symptomatically bradycardia and hypotension.  Ventricular fibrillation requiring CPR and successful defibrillation to restore sinus rhythm.  Successful PCI to the RCA requiring PTCA, thrombectomy with significant thrombus removal, and ultimate stenting with a 3.024 mm Synergy DES stent postdilated to 3.25 mm with the subtotal stenosis. Reduced to 0 and restoration of TIMI-3 flow throughout the entire dominant RCA distribution.  Anticoagulation utilizing Angiomax, Brilinta 180 mg orally, and intravenous Aggrastat.  RECOMMENDATION: The patient will require dual antiplatelet therapy for minimum of a year. She has been on chronic prednisone and may require several days of increased dose prior to resuming her daily low dose. Looking cessation is imperative.       Assessment and Plan:  In order of problems listed above:  1.  CAD s/p inferior STEMI: No chest pain or sob. Continue current regimen. DAPT with ASA and brillinta. Will give rx of SL nitro. Her blood pressure is borderline soft. HR of 67. Will hold adding BB today, consider adding during next office visit. She will get CRP 2.   2. Hx of Symptomatic bradycardia: Her cath complicated by symptomatic bradycardia and hypotension requiring placement of temp pacer (removed later) and VF requiring CPR and successful defibrillation. No dizziness, orthopnea or syncope. HR of 67 today. As above.   3.  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 HTN: BP borderline low. Continue to monitor.   5.  HLD:  Continue Atorvastatin 80 mg. Check Lipid and hepatic panel in 6 weeks. LDL goal less than 70.  6. Tobacco abuse: Quit since MI. Congratulated.   7. Elevated Creatinine: Her Scr was 1.47 on 01/08/16 and lisinopril was added. Will get BMET today.   8. R knee pain: Surgery postponed for at least 1 year. Her pain is proved on Voltaren gel.     Medication Adjustments/Labs and Tests Ordered: Current medicines are reviewed at length with the patient today.  Concerns regarding medicines are outlined above.  Medication changes, Labs and Tests ordered today are listed in the Patient Instructions below. Patient Instructions  Medication Instructions:  Your physician has recommended you make the following change in your medication:  An Rx has been sent to your pharmacy to sublingual Nitroglycerin. Please use as needed as directed   Labwork: Bmet today  Your physician recommends that you return for a FASTING lipid profile and lft in 6-8 weeks  Testing/Procedures: None ordered  Follow-Up: Your physician recommends that you schedule a follow-up appointment in: 6-8 weeks with Dr.Kelly   Any Other Special Instructions Will Be Listed Below (If Applicable).     If you need a refill on your cardiac medications before your next appointment, please call your pharmacy.       Jarrett Soho, [place]  01/19/2016 11:28 AM    [facility] [place], [place], [place]  [address] Phone: [phone]; Fax: [phone]

## 2016-01-19 NOTE — Addendum Note (Signed)
Addended by: Eulis Foster on: 01/19/2016 11:35 AM   Modules accepted: Orders

## 2016-01-19 NOTE — Patient Instructions (Addendum)
Medication Instructions:  Your physician has recommended you make the following change in your medication:  An Rx has been sent to your pharmacy to sublingual Nitroglycerin. Please use as needed as directed   Labwork: Bmet today  Your physician recommends that you return for a FASTING lipid profile and lft in 6-8 weeks  Testing/Procedures: None ordered  Follow-Up: Your physician recommends that you schedule a follow-up appointment in: 6-8 weeks with Dr.Kelly   Any Other Special Instructions Will Be Listed Below (If Applicable).     If you need a refill on your cardiac medications before your next appointment, please call your pharmacy.

## 2016-01-21 ENCOUNTER — Inpatient Hospital Stay: Payer: Self-pay | Admitting: Family Medicine

## 2016-01-21 DIAGNOSIS — E785 Hyperlipidemia, unspecified: Secondary | ICD-10-CM | POA: Diagnosis not present

## 2016-01-21 DIAGNOSIS — I2111 ST elevation (STEMI) myocardial infarction involving right coronary artery: Secondary | ICD-10-CM | POA: Diagnosis not present

## 2016-01-21 DIAGNOSIS — E249 Cushing's syndrome, unspecified: Secondary | ICD-10-CM | POA: Diagnosis not present

## 2016-01-21 DIAGNOSIS — J449 Chronic obstructive pulmonary disease, unspecified: Secondary | ICD-10-CM | POA: Diagnosis not present

## 2016-01-21 DIAGNOSIS — Z7951 Long term (current) use of inhaled steroids: Secondary | ICD-10-CM | POA: Diagnosis not present

## 2016-01-21 DIAGNOSIS — Z720 Tobacco use: Secondary | ICD-10-CM | POA: Diagnosis not present

## 2016-01-21 DIAGNOSIS — Z7901 Long term (current) use of anticoagulants: Secondary | ICD-10-CM | POA: Diagnosis not present

## 2016-01-21 DIAGNOSIS — Z7952 Long term (current) use of systemic steroids: Secondary | ICD-10-CM | POA: Diagnosis not present

## 2016-01-21 DIAGNOSIS — I10 Essential (primary) hypertension: Secondary | ICD-10-CM | POA: Diagnosis not present

## 2016-01-25 ENCOUNTER — Telehealth: Payer: Self-pay | Admitting: *Deleted

## 2016-01-25 DIAGNOSIS — Z7952 Long term (current) use of systemic steroids: Secondary | ICD-10-CM | POA: Diagnosis not present

## 2016-01-25 DIAGNOSIS — Z7901 Long term (current) use of anticoagulants: Secondary | ICD-10-CM | POA: Diagnosis not present

## 2016-01-25 DIAGNOSIS — E785 Hyperlipidemia, unspecified: Secondary | ICD-10-CM | POA: Diagnosis not present

## 2016-01-25 DIAGNOSIS — I2111 ST elevation (STEMI) myocardial infarction involving right coronary artery: Secondary | ICD-10-CM | POA: Diagnosis not present

## 2016-01-25 DIAGNOSIS — J449 Chronic obstructive pulmonary disease, unspecified: Secondary | ICD-10-CM | POA: Diagnosis not present

## 2016-01-25 DIAGNOSIS — Z720 Tobacco use: Secondary | ICD-10-CM | POA: Diagnosis not present

## 2016-01-25 DIAGNOSIS — I10 Essential (primary) hypertension: Secondary | ICD-10-CM | POA: Diagnosis not present

## 2016-01-25 DIAGNOSIS — Z7951 Long term (current) use of inhaled steroids: Secondary | ICD-10-CM | POA: Diagnosis not present

## 2016-01-25 DIAGNOSIS — E249 Cushing's syndrome, unspecified: Secondary | ICD-10-CM | POA: Diagnosis not present

## 2016-01-25 NOTE — Telephone Encounter (Signed)
Forwarded to Dr. Lowne-Chase. JG//CMA 

## 2016-01-27 NOTE — Telephone Encounter (Signed)
Signed forms faxed to [facility] successfully, sent for scanning. JG//CMA  

## 2016-01-31 DIAGNOSIS — I2111 ST elevation (STEMI) myocardial infarction involving right coronary artery: Secondary | ICD-10-CM | POA: Diagnosis not present

## 2016-01-31 DIAGNOSIS — E785 Hyperlipidemia, unspecified: Secondary | ICD-10-CM | POA: Diagnosis not present

## 2016-01-31 DIAGNOSIS — I10 Essential (primary) hypertension: Secondary | ICD-10-CM | POA: Diagnosis not present

## 2016-01-31 DIAGNOSIS — Z720 Tobacco use: Secondary | ICD-10-CM | POA: Diagnosis not present

## 2016-01-31 DIAGNOSIS — J449 Chronic obstructive pulmonary disease, unspecified: Secondary | ICD-10-CM | POA: Diagnosis not present

## 2016-01-31 DIAGNOSIS — Z7951 Long term (current) use of inhaled steroids: Secondary | ICD-10-CM | POA: Diagnosis not present

## 2016-01-31 DIAGNOSIS — E249 Cushing's syndrome, unspecified: Secondary | ICD-10-CM | POA: Diagnosis not present

## 2016-01-31 DIAGNOSIS — Z7952 Long term (current) use of systemic steroids: Secondary | ICD-10-CM | POA: Diagnosis not present

## 2016-01-31 DIAGNOSIS — Z7901 Long term (current) use of anticoagulants: Secondary | ICD-10-CM | POA: Diagnosis not present

## 2016-02-08 ENCOUNTER — Other Ambulatory Visit: Payer: Self-pay | Admitting: Family Medicine

## 2016-02-08 NOTE — Telephone Encounter (Signed)
Last alprazolam refill: 12/16/15, #60 Last OV: 01/13/16 (acute) Next OV: 04/14/16 UDS: low risk, 11/2014. Needs to repeat at upcoming appt in August.  Rx printed and forwarded to PCP for signature.

## 2016-02-22 ENCOUNTER — Encounter: Payer: Self-pay | Admitting: Family Medicine

## 2016-02-22 ENCOUNTER — Ambulatory Visit: Payer: Medicare Other | Admitting: Family Medicine

## 2016-02-22 ENCOUNTER — Ambulatory Visit (INDEPENDENT_AMBULATORY_CARE_PROVIDER_SITE_OTHER): Payer: Medicare Other | Admitting: Family Medicine

## 2016-02-22 ENCOUNTER — Ambulatory Visit: Payer: Self-pay | Admitting: Family Medicine

## 2016-02-22 VITALS — BP 116/76 | HR 70 | Temp 98.1°F | Ht 59.0 in | Wt 130.8 lb

## 2016-02-22 DIAGNOSIS — E785 Hyperlipidemia, unspecified: Secondary | ICD-10-CM

## 2016-02-22 DIAGNOSIS — I2111 ST elevation (STEMI) myocardial infarction involving right coronary artery: Secondary | ICD-10-CM

## 2016-02-22 DIAGNOSIS — F17200 Nicotine dependence, unspecified, uncomplicated: Secondary | ICD-10-CM | POA: Diagnosis not present

## 2016-02-22 DIAGNOSIS — M79601 Pain in right arm: Secondary | ICD-10-CM

## 2016-02-22 MED ORDER — NICOTINE 10 MG IN INHA: 1.0000 | RESPIRATORY_TRACT | Status: DC | PRN

## 2016-02-22 NOTE — Assessment & Plan Note (Signed)
Cardiology has labs scheduled for pt to f/u

## 2016-02-22 NOTE — Patient Instructions (Signed)
Smoking Cessation, Tips for Success If you are ready to quit smoking, congratulations! You have chosen to help yourself be healthier. Cigarettes bring nicotine, tar, carbon monoxide, and other irritants into your body. Your lungs, heart, and blood vessels will be able to work better without these poisons. There are many different ways to quit smoking. Nicotine gum, nicotine patches, a nicotine inhaler, or nicotine nasal spray can help with physical craving. Hypnosis, support groups, and medicines help break the habit of smoking. WHAT THINGS CAN I DO TO MAKE QUITTING EASIER?  Here are some tips to help you quit for good:  Pick a date when you will quit smoking completely. Tell all of your friends and family about your plan to quit on that date.  Do not try to slowly cut down on the number of cigarettes you are smoking. Pick a quit date and quit smoking completely starting on that day.  Throw away all cigarettes.   Clean and remove all ashtrays from your home, work, and car.  On a card, write down your reasons for quitting. Carry the card with you and read it when you get the urge to smoke.  Cleanse your body of nicotine. Drink enough water and fluids to keep your urine clear or pale yellow. Do this after quitting to flush the nicotine from your body.  Learn to predict your moods. Do not let a bad situation be your excuse to have a cigarette. Some situations in your life might tempt you into wanting a cigarette.  Never have "just one" cigarette. It leads to wanting another and another. Remind yourself of your decision to quit.  Change habits associated with smoking. If you smoked while driving or when feeling stressed, try other activities to replace smoking. Stand up when drinking your coffee. Brush your teeth after eating. Sit in a different chair when you read the paper. Avoid alcohol while trying to quit, and try to drink fewer caffeinated beverages. Alcohol and caffeine may urge you to  smoke.  Avoid foods and drinks that can trigger a desire to smoke, such as sugary or spicy foods and alcohol.  Ask people who smoke not to smoke around you.  Have something planned to do right after eating or having a cup of coffee. For example, plan to take a walk or exercise.  Try a relaxation exercise to calm you down and decrease your stress. Remember, you may be tense and nervous for the first 2 weeks after you quit, but this will pass.  Find new activities to keep your hands busy. Play with a pen, coin, or rubber band. Doodle or draw things on paper.  Brush your teeth right after eating. This will help cut down on the craving for the taste of tobacco after meals. You can also try mouthwash.   Use oral substitutes in place of cigarettes. Try using lemon drops, carrots, cinnamon sticks, or chewing gum. Keep them handy so they are available when you have the urge to smoke.  When you have the urge to smoke, try deep breathing.  Designate your home as a nonsmoking area.  If you are a heavy smoker, ask your health care provider about a prescription for nicotine chewing gum. It can ease your withdrawal from nicotine.  Reward yourself. Set aside the cigarette money you save and buy yourself something nice.  Look for support from others. Join a support group or smoking cessation program. Ask someone at home or at work to help you with your plan   to quit smoking.  Always ask yourself, "Do I need this cigarette or is this just a reflex?" Tell yourself, "Today, I choose not to smoke," or "I do not want to smoke." You are reminding yourself of your decision to quit.  Do not replace cigarette smoking with electronic cigarettes (commonly called e-cigarettes). The safety of e-cigarettes is unknown, and some may contain harmful chemicals.  If you relapse, do not give up! Plan ahead and think about what you will do the next time you get the urge to smoke. HOW WILL I FEEL WHEN I QUIT SMOKING? You  may have symptoms of withdrawal because your body is used to nicotine (the addictive substance in cigarettes). You may crave cigarettes, be irritable, feel very hungry, cough often, get headaches, or have difficulty concentrating. The withdrawal symptoms are only temporary. They are strongest when you first quit but will go away within 10-14 days. When withdrawal symptoms occur, stay in control. Think about your reasons for quitting. Remind yourself that these are signs that your body is healing and getting used to being without cigarettes. Remember that withdrawal symptoms are easier to treat than the major diseases that smoking can cause.  Even after the withdrawal is over, expect periodic urges to smoke. However, these cravings are generally short lived and will go away whether you smoke or not. Do not smoke! WHAT RESOURCES ARE AVAILABLE TO HELP ME QUIT SMOKING? Your health care provider can direct you to community resources or hospitals for support, which may include:  Group support.  Education.  Hypnosis.  Therapy.   This information is not intended to replace advice given to you by your health care provider. Make sure you discuss any questions you have with your health care provider.   Document Released: 05/12/2004 Document Revised: 09/04/2014 Document Reviewed: 01/30/2013 Elsevier Interactive Patient Education 2016 [employer]  

## 2016-02-22 NOTE — Progress Notes (Signed)
Patient ID: Monica Copeland, female    DOB: Nov 02, 1940  Age: 75 y.o. MRN: [id number]    Subjective:  Subjective HPI RONNETTA FAUNCE presents for f/u MI -- she is doing well.  No further chest pain-- she has already had cardiology f/u.   She also c/o R arm pain.  Her dog jerked her arm while walking about 3 weeks ago and pain is getting worse.  Pt is holding her arm close to her body and is unable to move it without significant pain.  Her son is with her.    Review of Systems  Constitutional: Negative for diaphoresis, appetite change, fatigue and unexpected weight change.  Eyes: Negative for pain, redness and visual disturbance.  Respiratory: Negative for cough, chest tightness, shortness of breath and wheezing.   Cardiovascular: Negative for chest pain, palpitations and leg swelling.  Endocrine: Negative for cold intolerance, heat intolerance, polydipsia, polyphagia and polyuria.  Genitourinary: Negative for dysuria, frequency and difficulty urinating.  Musculoskeletal: Positive for joint swelling.       R arm pain  Neurological: Negative for dizziness, light-headedness, numbness and headaches.    History Past Medical History  Diagnosis Date  . Hypertension   . Hyperlipemia   . Asthma   . Diabetes mellitus without complication ([place])   . Asthma   . Back pain   . Seizures ([place])   . Anxiety   . 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 GERD (gastroesophageal reflux disease) 01/24/2012  . Emphysema of lung ([place])   . Hyperlipidemia   . Ulcer   . Hemorrhoid   . IBS (irritable bowel syndrome)   . Iron deficiency anemia   . Gastric AVM     She has past surgical history that includes Brain surgery (1987); Colon surgery; Appendectomy; candida esophagitis; pylorus ulcer; Abdominal hysterectomy; Cardiac catheterization (N/A, 01/05/2016); Cardiac catheterization (N/A, 01/05/2016); and Cardiac catheterization (N/A, 01/05/2016).   Her family history includes Asthma in her father and son; Diabetes in  her father; Esophageal cancer in her brother; Heart attack in her mother; Hypertension in her brother and mother; Liver cancer in her brother; Lung cancer in her father. There is no history of Colon cancer.She reports that she has been smoking Cigarettes.  She has been smoking about 0.00 packs per day. She has never used smokeless tobacco. She reports that she does not drink alcohol or use illicit drugs.  Current Outpatient Prescriptions on File Prior to Visit  Medication Sig Dispense Refill  . albuterol (PROAIR HFA) 108 (90 Base) MCG/ACT inhaler Inhale 1-2 puffs into the lungs every 6 (six) hours as needed for wheezing or shortness of breath. 1 Inhaler 3  . ALPRAZolam (XANAX) 0.5 MG tablet TAKE 1/2 TO 1 TABLET BY MOUTH 2 TIMES A DAY AS NEEDED FOR ANXIETY. 60 tablet 0  . aspirin EC 81 MG EC tablet Take 1 tablet (81 mg total) by mouth daily.    Marland Kitchen atorvastatin (LIPITOR) 80 MG tablet Take 80 mg by mouth daily at 6 PM.     . budesonide-formoterol (SYMBICORT) 80-4.5 MCG/ACT inhaler Inhale 2 puffs into the lungs daily as needed. For shortness of breath 1 Inhaler 3  . diclofenac sodium (VOLTAREN) 1 % GEL Apply 4 g topically 4 (four) times daily. 100 g 3  . diphenhydramine-acetaminophen (TYLENOL PM) 25-500 MG TABS tablet Take 1 tablet by mouth at bedtime as needed. For sleep    . famotidine (PEPCID) 20 MG tablet Take 1 tablet (20 mg total) by mouth 2 (two) times  daily. 60 tablet 0  . feeding supplement, ENSURE COMPLETE, (ENSURE COMPLETE) LIQD Take 237 mLs by mouth 2 (two) times daily between meals. 237 mL 0  . fenofibrate 160 MG tablet TAKE 1 TABLET BY MOUTH ONCE DAILY 30 tablet 5  . ferrous sulfate 325 (65 FE) MG tablet TAKE 1 TABLET BY MOUTH TWICE A DAY BEFORE LUNCH AND SUPPER 60 tablet 5  . FLUoxetine (PROZAC) 20 MG capsule TAKE 1 CAPSULE BY MOUTH ONCE DAILY 30 capsule 5  . hyoscyamine (LEVSIN SL) 0.125 MG SL tablet PLACE 1 TABLET UNDER THE TONGUE EVERY 4 HOURS AS NEEDED. 30 tablet 0  . levothyroxine  (SYNTHROID, LEVOTHROID) 25 MCG tablet Take 1 tablet (25 mcg total) by mouth daily before breakfast. 30 tablet 0  . lisinopril (PRINIVIL,ZESTRIL) 2.5 MG tablet Take 1 tablet (2.5 mg total) by mouth daily. 30 tablet 12  . pantoprazole (PROTONIX) 40 MG tablet Take 1 tablet (40 mg total) by mouth daily. 30 tablet 12  . predniSONE (DELTASONE) 5 MG tablet Take 1 tablet (5 mg total) by mouth daily. 90 tablet 3  . ticagrelor (BRILINTA) 90 MG TABS tablet Take 1 tablet (90 mg total) by mouth 2 (two) times daily. 60 tablet 12   No current facility-administered medications on file prior to visit.     Objective:  Objective Physical Exam  Constitutional: She is oriented to person, place, and time. She appears well-developed and well-nourished.  HENT:  Head: Normocephalic and atraumatic.  Eyes: Conjunctivae and EOM are normal.  Neck: Normal range of motion. Neck supple. No JVD present. Carotid bruit is not present. No thyromegaly present.  Cardiovascular: Normal rate, regular rhythm and normal heart sounds.   No murmur heard. Pulmonary/Chest: Effort normal and breath sounds normal. No respiratory distress. She has no wheezes. She has no rales. She exhibits no tenderness.  Musculoskeletal: She exhibits tenderness. She exhibits no edema.       Right elbow: She exhibits decreased range of motion and swelling. Tenderness found.       Arms: Neurological: She is alert and oriented to person, place, and time.  Psychiatric: She has a normal mood and affect. Her behavior is normal. Judgment and thought content normal.  Nursing note and vitals reviewed.  BP 116/76 mmHg  Pulse 70  Temp(Src) 98.1 F (36.7 C) (Oral)  Ht 4\' 11"  (1.499 m)  Wt 130 lb 12.8 oz (59.33 kg)  BMI 26.40 kg/m2  SpO2 98% Wt Readings from Last 3 Encounters:  02/22/16 130 lb 12.8 oz (59.33 kg)  01/19/16 138 lb 12.8 oz (62.959 kg)  01/13/16 136 lb 3.2 oz (61.78 kg)     Lab Results  Component Value Date   WBC 17.4* 01/08/2016   HGB  8.9* 01/08/2016   HCT 27.8* 01/08/2016   PLT 214 01/08/2016   GLUCOSE 127* 01/08/2016   CHOL 174 01/19/2016   TRIG 159* 01/19/2016   HDL 52 01/19/2016   LDLDIRECT 128.6 05/28/2014   LDLCALC 90 01/19/2016   ALT 22 01/19/2016   AST 25 01/19/2016   NA 138 01/08/2016   K 3.5 01/08/2016   CL 104 01/08/2016   CREATININE 1.47* 01/08/2016   BUN 27* 01/08/2016   CO2 23 01/08/2016   TSH 1.372 01/05/2016   INR 1.07 01/05/2016   HGBA1C 6.1* 01/05/2016   MICROALBUR 0.9 12/07/2014    No results found.   Assessment & Plan:  Plan I am having Ms. Stubbe start on nicotine. I am also having her maintain her feeding supplement (  ENSURE COMPLETE), levothyroxine, famotidine, ferrous sulfate, FLUoxetine, hyoscyamine, predniSONE, diphenhydramine-acetaminophen, aspirin, lisinopril, pantoprazole, ticagrelor, albuterol, budesonide-formoterol, diclofenac sodium, fenofibrate, atorvastatin, and ALPRAZolam.  Meds ordered this encounter  Medications  . nicotine (NICOTROL) 10 MG inhaler    Sig: Inhale 1 cartridge (1 continuous puffing total) into the lungs as needed for smoking cessation.    Dispense:  42 each    Refill:  0    Problem List Items Addressed This Visit      Unprioritized   Hyperlipidemia    Cardiology has labs scheduled for pt to f/u      Right arm pain    Sling Refer to sport med ? Tear       Relevant Orders   AMB referral to sports medicine   ST elevation (STEMI) myocardial infarction involving right coronary artery [facility])    Per cardiology      Tobacco use disorder - Primary    Pt will try inhaler.  Encouraged pt to quit completely Pt is motivated to quit--- son is present.  They both understand the importance of quitting rto prn      Relevant Medications   nicotine (NICOTROL) 10 MG inhaler      Follow-up: Return in about 6 months (around 08/23/2016), or if symptoms worsen or fail to improve, for annual exam, fasting.  Ann Held, DO

## 2016-02-22 NOTE — Assessment & Plan Note (Signed)
Sling Refer to sport med ? Tear

## 2016-02-22 NOTE — Assessment & Plan Note (Signed)
Per cardiology 

## 2016-02-22 NOTE — Assessment & Plan Note (Signed)
Pt will try inhaler.  Encouraged pt to quit completely Pt is motivated to quit--- son is present.  They both understand the importance of quitting rto prn

## 2016-02-22 NOTE — Progress Notes (Signed)
Pre visit review using our clinic review tool, if applicable. No additional management support is needed unless otherwise documented below in the visit note. 

## 2016-02-23 ENCOUNTER — Encounter: Payer: Self-pay | Admitting: Family Medicine

## 2016-02-23 ENCOUNTER — Ambulatory Visit (INDEPENDENT_AMBULATORY_CARE_PROVIDER_SITE_OTHER): Payer: Medicare Other | Admitting: Family Medicine

## 2016-02-23 ENCOUNTER — Ambulatory Visit: Payer: Self-pay | Admitting: Family Medicine

## 2016-02-23 VITALS — BP 116/72 | HR 80 | Ht 60.0 in | Wt 130.0 lb

## 2016-02-23 DIAGNOSIS — M79601 Pain in right arm: Secondary | ICD-10-CM | POA: Diagnosis not present

## 2016-02-23 NOTE — Patient Instructions (Addendum)
Your ultrasound is reassuring. You have multiple muscle strains of this arm (forearm, biceps, rotator cuff) but no tears. Do the exercises I showed you - 3 sets of 5 of each once a day: Wrist flexion/extension, bicep curls, hammer rotation, rotator cuff exercises. Call me if you want to do physical therapy. Heat 15 minutes at a time 3-4 times a day for spasms, pain. Voltaren gel up to 4 times a day regularly for 7-10 days then as needed. Follow up with me in 1 month.

## 2016-02-24 NOTE — Assessment & Plan Note (Signed)
diffuse tenderness but exam, ultrasound reassuring that she does not have any high grade muscle or tendon tears.  Reassured patient.  Shown home exercises to do daily.  Consider physical therapy.  Heat, voltaren gel.  F/u in 1 month.  Expect total of 6-8 weeks to completely resolve.

## 2016-02-24 NOTE — Progress Notes (Signed)
PCP: Ann Held, DO  Subjective:   HPI: Patient is a 75 y.o. female here for right arm pain.  Patient reports she had her dog on a leash about 3 weeks ago when the dog lunged away pulling on her right arm. Pain throughout the right arm as a result and she is right handed. Can be 10/10 level and sharp but not localized - feels in shoulder, elbow, forearm. Bruising of the upper arm. No prior injuries she is aware of. No other skin changes, numbness.  Past Medical History  Diagnosis Date  . Hypertension   . Hyperlipemia   . Asthma   . Diabetes mellitus without complication ([place])   . Asthma   . Back pain   . Seizures ([place])   . Anxiety   . 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 GERD (gastroesophageal reflux disease) 01/24/2012  . Emphysema of lung ([place])   . Hyperlipidemia   . Ulcer   . Hemorrhoid   . IBS (irritable bowel syndrome)   . Iron deficiency anemia   . Gastric AVM     Current Outpatient Prescriptions on File Prior to Visit  Medication Sig Dispense Refill  . albuterol (PROAIR HFA) 108 (90 Base) MCG/ACT inhaler Inhale 1-2 puffs into the lungs every 6 (six) hours as needed for wheezing or shortness of breath. 1 Inhaler 3  . ALPRAZolam (XANAX) 0.5 MG tablet TAKE 1/2 TO 1 TABLET BY MOUTH 2 TIMES A DAY AS NEEDED FOR ANXIETY. 60 tablet 0  . aspirin EC 81 MG EC tablet Take 1 tablet (81 mg total) by mouth daily.    Marland Kitchen atorvastatin (LIPITOR) 80 MG tablet Take 80 mg by mouth daily at 6 PM.     . budesonide-formoterol (SYMBICORT) 80-4.5 MCG/ACT inhaler Inhale 2 puffs into the lungs daily as needed. For shortness of breath 1 Inhaler 3  . diclofenac sodium (VOLTAREN) 1 % GEL Apply 4 g topically 4 (four) times daily. 100 g 3  . diphenhydramine-acetaminophen (TYLENOL PM) 25-500 MG TABS tablet Take 1 tablet by mouth at bedtime as needed. For sleep    . famotidine (PEPCID) 20 MG tablet Take 1 tablet (20 mg total) by mouth 2 (two) times daily. 60 tablet 0  . feeding supplement, ENSURE  COMPLETE, (ENSURE COMPLETE) LIQD Take 237 mLs by mouth 2 (two) times daily between meals. 237 mL 0  . fenofibrate 160 MG tablet TAKE 1 TABLET BY MOUTH ONCE DAILY 30 tablet 5  . ferrous sulfate 325 (65 FE) MG tablet TAKE 1 TABLET BY MOUTH TWICE A DAY BEFORE LUNCH AND SUPPER 60 tablet 5  . FLUoxetine (PROZAC) 20 MG capsule TAKE 1 CAPSULE BY MOUTH ONCE DAILY 30 capsule 5  . hyoscyamine (LEVSIN SL) 0.125 MG SL tablet PLACE 1 TABLET UNDER THE TONGUE EVERY 4 HOURS AS NEEDED. 30 tablet 0  . levothyroxine (SYNTHROID, LEVOTHROID) 25 MCG tablet Take 1 tablet (25 mcg total) by mouth daily before breakfast. 30 tablet 0  . lisinopril (PRINIVIL,ZESTRIL) 2.5 MG tablet Take 1 tablet (2.5 mg total) by mouth daily. 30 tablet 12  . nicotine (NICOTROL) 10 MG inhaler Inhale 1 cartridge (1 continuous puffing total) into the lungs as needed for smoking cessation. 42 each 0  . pantoprazole (PROTONIX) 40 MG tablet Take 1 tablet (40 mg total) by mouth daily. 30 tablet 12  . predniSONE (DELTASONE) 5 MG tablet Take 1 tablet (5 mg total) by mouth daily. 90 tablet 3  . ticagrelor (BRILINTA) 90 MG TABS tablet Take 1  tablet (90 mg total) by mouth 2 (two) times daily. 60 tablet 12   No current facility-administered medications on file prior to visit.    Past Surgical History  Procedure Laterality Date  . 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 Colon surgery      colon perforation  . Appendectomy    . Candida esophagitis    . Pylorus ulcer    . [place]  . Cardiac catheterization N/A 01/05/2016    Procedure: Left Heart Cath and Coronary Angiography;  Surgeon: Troy Sine, MD;  Location: [place] [facility];  Service: Cardiovascular;  Laterality: N/A;  . Cardiac catheterization N/A 01/05/2016    Procedure: Coronary Stent Intervention;  Surgeon: Troy Sine, MD;  Location: [place] [facility];  Service: Cardiovascular;  Laterality: N/A;  . Cardiac catheterization N/A 01/05/2016    Procedure:  Temporary Pacemaker;  Surgeon: Troy Sine, MD;  Location: [place] [facility];  Service: Cardiovascular;  Laterality: N/A;    Allergies  Allergen Reactions  . Tomato Rash    Social History   Social History  . Marital Status: Widowed    Spouse Name: N/A  . Number of Children: N/A  . Years of Education: N/A   Occupational History  . Not on file.   Social History Main Topics  . Smoking status: Current Some Day Smoker -- 0.00 packs/day    Types: Cigarettes  . Smokeless tobacco: Never Used     Comment: Started smoking at age 69.form given 04-02-13  . Alcohol Use: No  . Drug Use: No  . Sexual Activity: Not on file   Other Topics Concern  . Not on file   Social History Narrative   ** Merged History Encounter **        Family History  Problem Relation Age of Onset  . Hypertension Mother   . Hypertension Brother   . Heart attack Mother   . Diabetes Father   . Asthma Father   . Asthma Son   . Lung cancer Father   . Esophageal cancer Brother   . Liver cancer Brother     mets from esophagus  . Colon cancer Neg Hx     BP 116/72 mmHg  Pulse 80  Ht 5' (1.524 m)  Wt 130 lb (58.968 kg)  BMI 25.39 kg/m2  Review of Systems: See HPI above.    Objective:  Physical Exam:  Gen: NAD, comfortable in exam room  Right shoulder: Small bruise right upper arm.  No swelling, gross deformity. Diffuse tenderness about shoulder, upper arm, elbow and forearm. FROM with painful arc. Positive Hawkins, Neers. Negative Speeds, Yergasons. Strength 5/5 with empty can and resisted internal/external rotation.  Pain resisted empty can, ER > IR Negative apprehension. NV intact distally.  Right elbow: No gross deformity, swelling, bruising. Tenderness as noted above. FROM. Collateral ligaments intact.  MSK u/s elbow and shoulder:  No evidence abnormalities or tears of biceps, flexor and extensor tendons at elbow, supraspinatus, infraspinatus, subscapularis.  Assessment & Plan:   1. Right arm injury - diffuse tenderness but exam, ultrasound reassuring that she does not have any high grade muscle or tendon tears.  Reassured patient.  Shown home exercises to do daily.  Consider physical therapy.  Heat, voltaren gel.  F/u in 1 month.  Expect total of 6-8 weeks to completely resolve.

## 2016-02-25 ENCOUNTER — Ambulatory Visit: Payer: Medicare Other | Admitting: Family Medicine

## 2016-03-03 ENCOUNTER — Other Ambulatory Visit: Payer: Self-pay | Admitting: Family Medicine

## 2016-03-08 ENCOUNTER — Other Ambulatory Visit: Payer: Self-pay | Admitting: Family Medicine

## 2016-03-09 ENCOUNTER — Other Ambulatory Visit: Payer: Self-pay

## 2016-03-09 MED ORDER — ALPRAZOLAM 0.5 MG PO TABS: ORAL_TABLET | ORAL | Status: AC

## 2016-03-09 NOTE — Telephone Encounter (Signed)
Last seen 02/22/16 and filled 02/08/16 #60 UDS 12/07/14 low risk   Please advise     KP

## 2016-03-09 NOTE — Telephone Encounter (Signed)
Lipitor 80 mg, is on the medication list. Please advise     KP

## 2016-03-09 NOTE — Telephone Encounter (Signed)
It says - historical provider-- maybe cardiology or it was increased in hospital?

## 2016-03-23 ENCOUNTER — Encounter (HOSPITAL_COMMUNITY): Payer: Self-pay

## 2016-03-23 ENCOUNTER — Other Ambulatory Visit: Payer: Self-pay

## 2016-03-23 ENCOUNTER — Observation Stay (HOSPITAL_COMMUNITY)
Admission: EM | Admit: 2016-03-23 | Discharge: 2016-03-26 | Disposition: A | Discharge status: Home Health | Payer: Medicare Other | Attending: Internal Medicine | Admitting: Internal Medicine

## 2016-03-23 ENCOUNTER — Emergency Department (HOSPITAL_COMMUNITY): Payer: Medicare Other

## 2016-03-23 DIAGNOSIS — R4182 Altered mental status, unspecified: Principal | ICD-10-CM | POA: Insufficient documentation

## 2016-03-23 DIAGNOSIS — R778 Other specified abnormalities of plasma proteins: Secondary | ICD-10-CM

## 2016-03-23 DIAGNOSIS — F329 Major depressive disorder, single episode, unspecified: Secondary | ICD-10-CM | POA: Diagnosis not present

## 2016-03-23 DIAGNOSIS — I2582 Chronic total occlusion of coronary artery: Secondary | ICD-10-CM | POA: Insufficient documentation

## 2016-03-23 DIAGNOSIS — R1110 Vomiting, unspecified: Secondary | ICD-10-CM | POA: Diagnosis present

## 2016-03-23 DIAGNOSIS — I252 Old myocardial infarction: Secondary | ICD-10-CM | POA: Diagnosis not present

## 2016-03-23 DIAGNOSIS — I959 Hypotension, unspecified: Secondary | ICD-10-CM | POA: Diagnosis not present

## 2016-03-23 DIAGNOSIS — E860 Dehydration: Secondary | ICD-10-CM | POA: Diagnosis not present

## 2016-03-23 DIAGNOSIS — Z7951 Long term (current) use of inhaled steroids: Secondary | ICD-10-CM | POA: Insufficient documentation

## 2016-03-23 DIAGNOSIS — R112 Nausea with vomiting, unspecified: Secondary | ICD-10-CM | POA: Diagnosis not present

## 2016-03-23 DIAGNOSIS — N179 Acute kidney failure, unspecified: Secondary | ICD-10-CM | POA: Insufficient documentation

## 2016-03-23 DIAGNOSIS — I2510 Atherosclerotic heart disease of native coronary artery without angina pectoris: Secondary | ICD-10-CM | POA: Insufficient documentation

## 2016-03-23 DIAGNOSIS — E785 Hyperlipidemia, unspecified: Secondary | ICD-10-CM | POA: Diagnosis not present

## 2016-03-23 DIAGNOSIS — K219 Gastro-esophageal reflux disease without esophagitis: Secondary | ICD-10-CM | POA: Diagnosis not present

## 2016-03-23 DIAGNOSIS — I10 Essential (primary) hypertension: Secondary | ICD-10-CM | POA: Diagnosis not present

## 2016-03-23 DIAGNOSIS — F419 Anxiety disorder, unspecified: Secondary | ICD-10-CM | POA: Diagnosis not present

## 2016-03-23 DIAGNOSIS — R079 Chest pain, unspecified: Secondary | ICD-10-CM | POA: Diagnosis not present

## 2016-03-23 DIAGNOSIS — E119 Type 2 diabetes mellitus without complications: Secondary | ICD-10-CM | POA: Diagnosis not present

## 2016-03-23 DIAGNOSIS — K2970 Gastritis, unspecified, without bleeding: Secondary | ICD-10-CM | POA: Diagnosis not present

## 2016-03-23 DIAGNOSIS — D509 Iron deficiency anemia, unspecified: Secondary | ICD-10-CM | POA: Diagnosis not present

## 2016-03-23 DIAGNOSIS — R1111 Vomiting without nausea: Secondary | ICD-10-CM | POA: Diagnosis not present

## 2016-03-23 DIAGNOSIS — R7989 Other specified abnormal findings of blood chemistry: Secondary | ICD-10-CM

## 2016-03-23 DIAGNOSIS — Z955 Presence of coronary angioplasty implant and graft: Secondary | ICD-10-CM | POA: Insufficient documentation

## 2016-03-23 DIAGNOSIS — Z79899 Other long term (current) drug therapy: Secondary | ICD-10-CM | POA: Insufficient documentation

## 2016-03-23 DIAGNOSIS — E039 Hypothyroidism, unspecified: Secondary | ICD-10-CM | POA: Insufficient documentation

## 2016-03-23 DIAGNOSIS — R748 Abnormal levels of other serum enzymes: Secondary | ICD-10-CM | POA: Diagnosis not present

## 2016-03-23 DIAGNOSIS — R1031 Right lower quadrant pain: Secondary | ICD-10-CM | POA: Insufficient documentation

## 2016-03-23 DIAGNOSIS — Z7902 Long term (current) use of antithrombotics/antiplatelets: Secondary | ICD-10-CM | POA: Insufficient documentation

## 2016-03-23 DIAGNOSIS — K589 Irritable bowel syndrome without diarrhea: Secondary | ICD-10-CM | POA: Diagnosis not present

## 2016-03-23 DIAGNOSIS — J449 Chronic obstructive pulmonary disease, unspecified: Secondary | ICD-10-CM | POA: Diagnosis not present

## 2016-03-23 DIAGNOSIS — R4781 Slurred speech: Secondary | ICD-10-CM | POA: Diagnosis not present

## 2016-03-23 DIAGNOSIS — Z7952 Long term (current) use of systemic steroids: Secondary | ICD-10-CM | POA: Insufficient documentation

## 2016-03-23 DIAGNOSIS — E876 Hypokalemia: Secondary | ICD-10-CM | POA: Diagnosis not present

## 2016-03-23 DIAGNOSIS — R109 Unspecified abdominal pain: Secondary | ICD-10-CM | POA: Diagnosis not present

## 2016-03-23 DIAGNOSIS — F17210 Nicotine dependence, cigarettes, uncomplicated: Secondary | ICD-10-CM | POA: Insufficient documentation

## 2016-03-23 LAB — I-STAT CG4 LACTIC ACID, ED: Lactic Acid, Venous: 2.16 mmol/L (ref 0.5–1.9)

## 2016-03-23 LAB — URINALYSIS, ROUTINE W REFLEX MICROSCOPIC
Bilirubin Urine: NEGATIVE
Glucose, UA: NEGATIVE mg/dL
Hgb urine dipstick: NEGATIVE
KETONES UR: NEGATIVE mg/dL
LEUKOCYTES UA: NEGATIVE
NITRITE: NEGATIVE
PH: 6.5 (ref 5.0–8.0)
Protein, ur: NEGATIVE mg/dL
SPECIFIC GRAVITY, URINE: 1.009 (ref 1.005–1.030)

## 2016-03-23 LAB — CBG MONITORING, ED: Glucose-Capillary: 86 mg/dL (ref 65–99)

## 2016-03-23 LAB — CBC
HCT: 42.9 % (ref 36.0–46.0)
Hemoglobin: 14.1 g/dL (ref 12.0–15.0)
MCH: 30.8 pg (ref 26.0–34.0)
MCHC: 32.9 g/dL (ref 30.0–36.0)
MCV: 93.7 fL (ref 78.0–100.0)
Platelets: 313 10*3/uL (ref 150–400)
RBC: 4.58 MIL/uL (ref 3.87–5.11)
RDW: 13.4 % (ref 11.5–15.5)
WBC: 9.9 10*3/uL (ref 4.0–10.5)

## 2016-03-23 LAB — COMPREHENSIVE METABOLIC PANEL
ALT: 22 U/L (ref 14–54)
AST: 41 U/L (ref 15–41)
Albumin: 3 g/dL — ABNORMAL LOW (ref 3.5–5.0)
Alkaline Phosphatase: 66 U/L (ref 38–126)
Anion gap: 8 (ref 5–15)
BUN: 18 mg/dL (ref 6–20)
CO2: 21 mmol/L — ABNORMAL LOW (ref 22–32)
Calcium: 8.6 mg/dL — ABNORMAL LOW (ref 8.9–10.3)
Chloride: 107 mmol/L (ref 101–111)
Creatinine, Ser: 1.15 mg/dL — ABNORMAL HIGH (ref 0.44–1.00)
GFR calc Af Amer: 53 mL/min — ABNORMAL LOW (ref >60)
GFR calc non Af Amer: 45 mL/min — ABNORMAL LOW (ref >60)
Glucose, Bld: 88 mg/dL (ref 65–99)
Potassium: 3.5 mmol/L (ref 3.5–5.1)
Sodium: 136 mmol/L (ref 135–145)
Total Bilirubin: 0.4 mg/dL (ref 0.3–1.2)
Total Protein: 5.9 g/dL — ABNORMAL LOW (ref 6.5–8.1)

## 2016-03-23 LAB — I-STAT TROPONIN, ED: Troponin i, poc: 0.1 ng/mL (ref 0.00–0.08)

## 2016-03-23 LAB — BRAIN NATRIURETIC PEPTIDE: B NATRIURETIC PEPTIDE 5: 41.5 pg/mL (ref 0.0–100.0)

## 2016-03-23 LAB — LIPASE, BLOOD: LIPASE: 21 U/L (ref 11–51)

## 2016-03-23 LAB — TROPONIN I: TROPONIN I: 0.1 ng/mL — AB (ref <0.03)

## 2016-03-23 MED ORDER — IOPAMIDOL (ISOVUE-300) INJECTION 61%
INTRAVENOUS | Status: AC
  Administered 2016-03-24: 75 mL
  Filled 2016-03-23: qty 75

## 2016-03-23 MED ORDER — SODIUM CHLORIDE 0.9 % IV BOLUS (SEPSIS)
500.0000 mL | Freq: Once | INTRAVENOUS | Status: AC
  Administered 2016-03-23: 500 mL via INTRAVENOUS

## 2016-03-23 MED ORDER — METOCLOPRAMIDE HCL 5 MG/ML IJ SOLN
5.0000 mg | Freq: Once | INTRAMUSCULAR | Status: AC
  Administered 2016-03-23: 5 mg via INTRAVENOUS
  Filled 2016-03-23: qty 2

## 2016-03-23 NOTE — ED Notes (Signed)
Pt UA that resulted at 2206, does not belong to pt, urine has not been collected and sent on this pt yet. Called lab, this results belong to the pt in D32.

## 2016-03-23 NOTE — ED Provider Notes (Signed)
[facility] DEPT Provider Note   CSN: [id number] Arrival date & time: 03/23/16  2042  First Provider Contact:  None       History   Chief Complaint Chief Complaint  Patient presents with  . Altered Mental Status  . Abdominal Pain  . Chest Pain    HPI Monica Copeland is a 75 y.o. female.  The history is provided by the patient.  Emesis   This is a new problem. The current episode started 3 to 5 hours ago. The problem occurs continuously. The problem has not changed since onset.The emesis has an appearance of stomach contents. There has been no fever. Associated symptoms include abdominal pain. Pertinent negatives include no arthralgias, no chills, no cough and no fever.    Past Medical History:  Diagnosis Date  . Anxiety   . Asthma   . Asthma   . Back pain   . 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 Diabetes mellitus without complication ([place])   . Emphysema of lung ([place])   . Gastric AVM   . GERD (gastroesophageal reflux disease) 01/24/2012  . Hemorrhoid   . Hyperlipemia   . Hyperlipidemia   . Hypertension   . IBS (irritable bowel syndrome)   . Iron deficiency anemia   . Seizures ([place])   . Ulcer     Patient Active Problem List   Diagnosis Date Noted  . Emesis 03/24/2016  . Right arm pain 02/22/2016  . Symptomatic bradycardia 01/19/2016  . ST elevation (STEMI) myocardial infarction involving right coronary artery ([place]) 01/05/2016  . HTN (hypertension) 01/05/2016  . VF (ventricular fibrillation) ([place])   . Bradycardia   . Balance problem 04/19/2015  . Knee pain 01/18/2015  . Breast pain in female 11/16/2014  . Malnutrition of moderate degree ([place]) 08/05/2014  . Abdominal pain 08/04/2014  . Altered mental status 08/04/2014  . Acute encephalopathy 08/04/2014  . UTI (urinary tract infection) 07/15/2014  . Diverticulitis of colon without hemorrhage 07/15/2014  . Diarrhea 07/15/2014  . Tobacco use disorder 10/28/2013  . Plantar fasciitis, bilateral 05/08/2013  .  Memory loss 04/10/2013  . 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 Renal insufficiency 12/12/2012  . Anxiety and depression 11/29/2012  . Bronchitis with asthma, acute 10/26/2012  . Hip pain, bilateral 09/12/2012  . Hallucination 09/12/2012  . TIA (transient ischemic attack) 04/25/2012  . Hemiplegia, unspecified, affecting nondominant side 04/25/2012  . 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 Gastric ulcer with hemorrhage 01/24/2012  . Candida esophagitis ([place]) 01/24/2012  . Esophageal candidiasis ([place]) 01/24/2012  . Pyloric ulcer 01/24/2012  . Blood in stool 01/23/2012  . Acute posthemorrhagic anemia 01/23/2012  . Dysphagia, unspecified(787.20) 01/23/2012  . Tachycardia 01/21/2012  . Chronic back pain 01/21/2012  . H/O brain surgery 01/21/2012  . Hyperlipidemia 01/21/2012    Past Surgical History:  Procedure Laterality Date  . ABD[place]CT[place]Y     [place]  . APPENDECTOMY    . 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 candida esophagitis    . CARDIAC CATHETERIZATION N/A 01/05/2016   Procedure: Left Heart Cath and Coronary Angiography;  Surgeon: Troy Sine, MD;  Location: [facility];  Service: Cardiovascular;  Laterality: N/A;  . CARDIAC CATHETERIZATION N/A 01/05/2016   Procedure: Coronary Stent Intervention;  Surgeon: Troy Sine, MD;  Location: [facility];  Service: Cardiovascular;  Laterality: N/A;  . CARDIAC CATHETERIZATION N/A 01/05/2016   Procedure: Temporary Pacemaker;  Surgeon: Joyice Faster  Claiborne Billings, MD;  Location: [facility];  Service: Cardiovascular;  Laterality: N/A;  . COLON SURGERY     colon perforation  . pylorus ulcer      OB History    No data available       Home Medications    Prior to Admission medications   Medication Sig Start Date End Date Taking? Authorizing Provider  albuterol (PROAIR HFA) 108 (90 Base) MCG/ACT inhaler Inhale 1-2 puffs into the lungs every 6 (six) hours as needed for  wheezing or shortness of breath. 01/13/16   Yvonne R Lowne Chase, DO  ALPRAZolam (XANAX) 0.5 MG tablet TAKE 1/2 TO 1 TABLET BY MOUTH 2 TIMES A DAY AS NEEDED FOR ANXIETY. 03/09/16   Rosalita Chessman Chase, DO  aspirin EC 81 MG EC tablet Take 1 tablet (81 mg total) by mouth daily. 01/08/16   Arbutus Leas, NP  atorvastatin (LIPITOR) 80 MG tablet Take 80 mg by mouth daily at 6 PM.  01/08/16   Historical Provider, MD  budesonide-formoterol (SYMBICORT) 80-4.5 MCG/ACT inhaler Inhale 2 puffs into the lungs daily as needed. For shortness of breath 01/13/16   Rosalita Chessman Chase, DO  diclofenac sodium (VOLTAREN) 1 % GEL Apply 4 g topically 4 (four) times daily. 01/13/16   Alferd Apa Lowne Chase, DO  diphenhydramine-acetaminophen (TYLENOL PM) 25-500 MG TABS tablet Take 1 tablet by mouth at bedtime as needed. For sleep    Historical Provider, MD  famotidine (PEPCID) 20 MG tablet Take 1 tablet (20 mg total) by mouth 2 (two) times daily. 08/07/14   Robbie Lis, MD  feeding supplement, ENSURE COMPLETE, (ENSURE COMPLETE) LIQD Take 237 mLs by mouth 2 (two) times daily between meals. 08/06/14   Robbie Lis, MD  fenofibrate 160 MG tablet TAKE 1 TABLET BY MOUTH ONCE DAILY 01/14/16   Rosalita Chessman Chase, DO  ferrous sulfate 325 (65 FE) MG tablet TAKE 1 TABLET BY MOUTH TWICE A DAY BEFORE LUNCH AND SUPPER 07/20/15   Alferd Apa Lowne Chase, DO  FLUoxetine (PROZAC) 20 MG capsule TAKE 1 CAPSULE BY MOUTH ONCE DAILY 08/20/15   Alferd Apa Lowne Chase, DO  hyoscyamine (LEVSIN SL) 0.125 MG SL tablet PLACE 1 TABLET UNDER THE TONGUE EVERY 4 HOURS AS NEEDED. 09/27/15   Rosalita Chessman Chase, DO  levothyroxine (SYNTHROID, LEVOTHROID) 25 MCG tablet Take 1 tablet (25 mcg total) by mouth daily before breakfast. 08/06/14   Robbie Lis, MD  lisinopril (PRINIVIL,ZESTRIL) 2.5 MG tablet Take 1 tablet (2.5 mg total) by mouth daily. 01/08/16   Arbutus Leas, NP  NICOTROL 10 MG inhaler INHALE 1 CARTRIDGE (1 CONTINUOUS PUFFING TOTAL) INTO THE LUNGS AS NEEDED  FOR SMOKING CESSATION UP TO MAX 16 CARTRIDGES PER DAY. 03/03/16   Yvonne R Lowne Chase, DO  pantoprazole (PROTONIX) 40 MG tablet Take 1 tablet (40 mg total) by mouth daily. 01/08/16   Arbutus Leas, NP  predniSONE (DELTASONE) 5 MG tablet Take 1 tablet (5 mg total) by mouth daily. 10/12/15   Renato Shin, MD  ticagrelor (BRILINTA) 90 MG TABS tablet Take 1 tablet (90 mg total) by mouth 2 (two) times daily. 01/08/16   Arbutus Leas, NP    Family History Family History  Problem Relation Age of Onset  . Hypertension Mother   . Heart attack Mother   . Diabetes Father   . Asthma Father   . Lung cancer Father   . Hypertension Brother   . Asthma Son   .  Esophageal cancer Brother   . Liver cancer Brother     mets from esophagus  . Colon cancer Neg Hx     Social History Social History  Substance Use Topics  . Smoking status: Current Some Day Smoker    Packs/day: 0.00    Types: Cigarettes  . Smokeless tobacco: Never Used     Comment: Started smoking at age 43.form given 04-02-13  . Alcohol use No     Allergies   Tomato   Review of Systems Review of Systems  Constitutional: Negative for chills and fever.  HENT: Negative for ear pain and sore throat.   Eyes: Negative for pain and visual disturbance.  Respiratory: Negative for cough and shortness of breath.   Cardiovascular: Positive for chest pain. Negative for palpitations.  Gastrointestinal: Positive for abdominal pain and vomiting.  Genitourinary: Negative for dysuria and hematuria.  Musculoskeletal: Negative for arthralgias and back pain.  Skin: Negative for color change and rash.  Neurological: Positive for speech difficulty. Negative for dizziness, seizures, syncope and numbness.  All other systems reviewed and are negative.  Physical Exam Updated Vital Signs BP 124/59   Pulse 76   Temp 99.1 F (37.3 C) (Rectal)   Resp 17   Ht 5\' 3"  (1.6 m)   Wt 59 kg   SpO2 100%   BMI 23.03 kg/m   Physical Exam  Constitutional: She  appears distressed.  Emesis on exam.    HENT:  Head: Normocephalic and atraumatic.  Eyes: Pupils are equal, round, and reactive to light.  Neck: Normal range of motion.  Cardiovascular: Normal rate and regular rhythm.   Pulmonary/Chest: Effort normal.  Abdominal: There is tenderness in the right lower quadrant and periumbilical area. There is no rigidity, no rebound, no guarding, no CVA tenderness, no tenderness at McBurney's point and negative Murphy's sign.  Neurological: She is alert. No cranial nerve deficit. GCS eye subscore is 4. GCS verbal subscore is 5. GCS motor subscore is 6.  Slurred speech.   Normal finger to nose bilaterally.   No pronator drift bilaterally.    Skin: Skin is warm. Capillary refill takes less than 2 seconds. She is diaphoretic.  Psychiatric: She has a normal mood and affect.     ED Treatments / Results  Labs (all labs ordered are listed, but only abnormal results are displayed) Labs Reviewed  COMPREHENSIVE METABOLIC PANEL - Abnormal; Notable for the following:       Result Value   CO2 21 (*)    Creatinine, Ser 1.15 (*)    Calcium 8.6 (*)    Total Protein 5.9 (*)    Albumin 3.0 (*)    GFR calc non Af Amer 45 (*)    GFR calc Af Amer 53 (*)    All other components within normal limits  TROPONIN I - Abnormal; Notable for the following:    Troponin I 0.10 (*)    All other components within normal limits  URINALYSIS, ROUTINE W REFLEX MICROSCOPIC (NOT AT [facility]) - Abnormal; Notable for the following:    Color, Urine AMBER (*)    APPearance CLOUDY (*)    Specific Gravity, Urine 1.038 (*)    Bilirubin Urine SMALL (*)    Leukocytes, UA TRACE (*)    All other components within normal limits  URINE MICROSCOPIC-ADD ON - Abnormal; Notable for the following:    Squamous Epithelial / LPF 0-5 (*)    Bacteria, UA FEW (*)    Casts HYALINE CASTS (*)  All other components within normal limits  I-STAT CG4 LACTIC ACID, ED - Abnormal; Notable for the following:      Lactic Acid, Venous 2.16 (*)    All other components within normal limits  I-STAT TROPOININ, ED - Abnormal; Notable for the following:    Troponin i, poc 0.10 (*)    All other components within normal limits  I-STAT TROPOININ, ED - Abnormal; Notable for the following:    Troponin i, poc 0.09 (*)    All other components within normal limits  CBC  LIPASE, BLOOD  URINALYSIS, ROUTINE W REFLEX MICROSCOPIC (NOT AT [facility])  BRAIN NATRIURETIC PEPTIDE  CBG MONITORING, ED  I-STAT CG4 LACTIC ACID, ED    EKG  EKG Interpretation  Date/Time:  Thursday March 23 2016 23:39:39 EDT Ventricular Rate:  80 PR Interval:  170 QRS Duration: 97 QT Interval:  459 QTC Calculation: 530 R Axis:   25 Text Interpretation:  Sinus rhythm Low voltage, extremity leads Prolonged QT interval No significant change since last tracing Confirmed by KOHUT  MD, STEPHEN ([id number]) on 03/23/2016 11:44:09 PM       Radiology Ct Head Wo Contrast  Result Date: 03/24/2016 CLINICAL DATA:  Altered mental status EXAM: CT HEAD WITHOUT CONTRAST TECHNIQUE: Contiguous axial images were obtained from the base of the skull through the vertex without intravenous contrast. COMPARISON:  08/04/2014; 04/24/2012; brain MRI - 08/05/2014. FINDINGS: Brain: Similar findings of advanced atrophy with sulcal prominence centralized volume loss with commensurate ex vacuo dilatation of the ventricular system. Scattered periventricular hypodensities compatible microvascular ischemic disease. Lacunar infarct versus prominent VR space within the caudal aspect the left basal ganglia image 10 series 2). Right-sided basal ganglial calcifications. No CT evidence of acute large territory infarct. No intraparenchymal or extra-axial mass or hemorrhage. Unchanged size a configuration of the ventricles and basilar cisterns. No midline shift Vascular: Intracranial atherosclerosis. Skull: No displaced calvarial fracture. Sinuses/Orbits: There is underpneumatization of the  right frontal sinus. The remaining paranasal sinuses and mastoid air cells are normally aerated. No air-fluid levels. Post bilateral cataract surgery. Other: Regional soft tissues appear normal. IMPRESSION: Similar findings of advanced atrophy and mild microvascular ischemic disease without acute intracranial process. Electronically Signed   By: Sandi Mariscal M.D.   On: 03/24/2016 00:50  Ct Abdomen Pelvis W Contrast  Result Date: 03/24/2016 CLINICAL DATA:  75 year old female with abdominal pain. History of ulcer, and IBS. EXAM: CT ABDOMEN AND PELVIS WITH CONTRAST TECHNIQUE: Multidetector CT imaging of the abdomen and pelvis was performed using the standard protocol following bolus administration of intravenous contrast. CONTRAST:  1 ISOVUE-300 IOPAMIDOL (ISOVUE-300) INJECTION 61% COMPARISON:  Abdominal CT dated 10/11/2015 FINDINGS: Minimal bibasilar dependent atelectatic changes of the lungs. The visualized lung bases are otherwise clear. There is coronary vascular calcification. No intra-abdominal free air or free fluid. Mild apparent fatty infiltration of the liver. Focal high attenuating area at the gallbladder fundus may represent stone or focal adenomyomatosis. Ultrasound may provide better evaluation of the gallbladder. No pericholecystic fluid. The pancreas, spleen, adrenal glands, left kidney appear unremarkable. Subcentimeter right renal hypodense lesions are too small to characterize but appears stable compared to prior study and likely represent cysts. Ultrasound may provide better characterization. No hydronephrosis. The visualized ureters and urinary bladder appear unremarkable. Hysterectomy. There is postsurgical changes of partial colon resection with anastomotic suture line in the sigmoid colon. There is no evidence of bowel obstruction or active inflammation. R The appendix is not visualized with certainty. No inflammatory changes identified in the  right lower quadrant. There is moderate  aortoiliac atherosclerotic disease. The origins of the celiac axis, SMA, IMA as well as the origins of the renal arteries appear patent. The SMV, splenic vein, and main portal veins are patent. No portal venous gas identified. There is no adenopathy. There is a midline vertical anterior pelvic wall incisional scar. The abdominal wall soft tissues are otherwise unremarkable. There is osteopenia with degenerative changes of the spine. No acute fracture. IMPRESSION: Focal adenomyomatosis of the gallbladder fundus versus gallstone. Ultrasound may provide better evaluation of the gallbladder. No other acute intra-abdominal or pelvic pathology identified. Electronically Signed   By: Anner Crete M.D.   On: 03/24/2016 01:01  Dg Chest Portable 1 View  Result Date: 03/23/2016 CLINICAL DATA:  Vomiting and chest pain for 1 day. EXAM: PORTABLE CHEST 1 VIEW COMPARISON:  01/05/2016; 06/23/2015; 08/04/2014 FINDINGS: Grossly unchanged cardiac silhouette and mediastinal contours with slight differences likely total to decreased lung volumes. Worsening bilateral infrahilar heterogeneous opacities, favored to represent atelectasis. No discrete focal airspace opacities. No pleural effusion pneumothorax. No evidence of edema. No acute osseus abnormalities. IMPRESSION: Perihilar atelectasis without definitive acute cardiopulmonary disease on this hypoventilated AP portable examination. Further evaluation with a PA and lateral chest radiograph may be obtained as clinically indicated. Electronically Signed   By: Sandi Mariscal M.D.   On: 03/23/2016 21:45   Procedures Procedures (including critical care time)  Medications Ordered in ED Medications  sodium chloride 0.9 % bolus 500 mL (not administered)  aspirin EC tablet 325 mg (not administered)  ticagrelor (BRILINTA) tablet 90 mg (not administered)  predniSONE (DELTASONE) tablet 5 mg (not administered)  pantoprazole (PROTONIX) EC tablet 40 mg (not administered)    lisinopril (PRINIVIL,ZESTRIL) tablet 2.5 mg (not administered)  levothyroxine (SYNTHROID, LEVOTHROID) tablet 25 mcg (not administered)  FLUoxetine (PROZAC) capsule 20 mg (not administered)  fenofibrate tablet 160 mg (not administered)  ferrous sulfate tablet 325 mg (not administered)  feeding supplement (ENSURE COMPLETE) (ENSURE COMPLETE) liquid 237 mL (not administered)  famotidine (PEPCID) tablet 20 mg (not administered)  mometasone-formoterol (DULERA) 100-5 MCG/ACT inhaler 2 puff (not administered)  atorvastatin (LIPITOR) tablet 80 mg (not administered)  aspirin EC tablet 81 mg (not administered)  albuterol (PROVENTIL HFA;VENTOLIN HFA) 108 (90 Base) MCG/ACT inhaler 1-2 puff (not administered)  ALPRAZolam (XANAX) tablet 0.25-0.5 mg (not administered)  nicotine (NICOTROL) 10 MG inhaler 1 continuous puffing (not administered)  0.9 %  sodium chloride infusion (not administered)  iopamidol (ISOVUE-300) 61 % injection (75 mLs  Contrast Given 03/24/16 0006)  metoCLOPramide (REGLAN) injection 5 mg (5 mg Intravenous Given 03/23/16 2208)  sodium chloride 0.9 % bolus 500 mL (500 mLs Intravenous New Bag/Given 03/23/16 2335)     Initial Impression / Assessment and Plan / ED Course  I have reviewed the triage vital signs and the nursing notes.  Pertinent labs & imaging results that were available during my care of the patient were reviewed by me and considered in my medical decision making (see chart for details).  Clinical Course    Pt is a 75 yo female with a hx of recent MIPresenting to the emergency department for increasing slurred speech, episode of chest pain, abdominal pain, and nausea and vomiting. Family members report that at 6 PM patient began having uncontrollable emesis associated with nausea and vomiting. Mild episode of chest pain during that time. Chest pain-free on arrival to the emergency department for continued emesis and abdominal pain.  On exam patient with borderline  hypotension and given  IV fluids. Continued emesis and given antiemetics in the ED. Afebrile however with no leukocytosis. Chest x-ray and UA without signs of infection. CT abdomen without acute intra-abdominal pathology. The lactic acid mildly elevated and repeat within normal limits. Low suspicion for infectious etiology.  Patient had mild slurred speech but no acute focal neurologic deficits. CT head without acute findings. Low suspicion for stroke or TIA at this time. Possibly associated with episodes of hypotension.  ECG and repeat ECG without acute ischemic changes. Troponin mildly elevated at 0.1. Wait until after CT scan to rule out acute abdominal bleed or pathology to give aspirin. Repeat troponin of 0.09. Discussed with Dr. Susy Manor with cardiology who feels patient does not need acute intervention at this time.  On reassessment of the patient no further emesis or abdominal pain. Reports no chest pain. Blood pressures normalized. Discussed with hospitalist and recommended admission for further observation and monitoring.  Labs, ECG and images were viewed by myself and incorporated into medical decision making.  Discussed pertinent finding with patient or caregiver prior to admission with no further questions.  Pt care supervised by my attending Dr. Wilson Singer.   Geronimo Boot, MD PGY-3 Emergency Medicine   Final Clinical Impressions(s) / ED Diagnoses   Final diagnoses:  None    New Prescriptions New Prescriptions   No medications on file     Geronimo Boot, MD 03/24/16 [place], MD 04/03/16 1149

## 2016-03-23 NOTE — ED Notes (Signed)
Pt unable to void at this time. 

## 2016-03-23 NOTE — ED Triage Notes (Signed)
Pt comes from home via [facility], c/o ALOC, and abd pain, both neighbor and daughter report the patient was just not acting normal. Pt has some slurred speech. No other neuro deficits. Pt has nausea and vomited twice today. PTA received 4mg  of zofran. RLQ pain and tender on palpation. EMS reports initial BP of 88 systolic.

## 2016-03-23 NOTE — ED Notes (Signed)
cbg [id number] notified nurse

## 2016-03-23 NOTE — ED Notes (Signed)
Pt's urine was collected at 23:05, not 22:06.

## 2016-03-24 ENCOUNTER — Emergency Department (HOSPITAL_COMMUNITY): Payer: Medicare Other

## 2016-03-24 ENCOUNTER — Encounter (HOSPITAL_COMMUNITY): Payer: Self-pay | Admitting: Radiology

## 2016-03-24 ENCOUNTER — Ambulatory Visit: Payer: Medicare Other | Admitting: Family Medicine

## 2016-03-24 DIAGNOSIS — R1110 Vomiting, unspecified: Secondary | ICD-10-CM | POA: Diagnosis present

## 2016-03-24 DIAGNOSIS — R4182 Altered mental status, unspecified: Secondary | ICD-10-CM | POA: Diagnosis not present

## 2016-03-24 DIAGNOSIS — R112 Nausea with vomiting, unspecified: Secondary | ICD-10-CM

## 2016-03-24 DIAGNOSIS — R1031 Right lower quadrant pain: Secondary | ICD-10-CM

## 2016-03-24 DIAGNOSIS — R7989 Other specified abnormal findings of blood chemistry: Secondary | ICD-10-CM

## 2016-03-24 DIAGNOSIS — R109 Unspecified abdominal pain: Secondary | ICD-10-CM | POA: Diagnosis not present

## 2016-03-24 DIAGNOSIS — R778 Other specified abnormalities of plasma proteins: Secondary | ICD-10-CM

## 2016-03-24 LAB — TROPONIN I
TROPONIN I: 0.12 ng/mL — AB (ref <0.03)
Troponin I: 0.11 ng/mL (ref <0.03)
Troponin I: 0.14 ng/mL (ref <0.03)

## 2016-03-24 LAB — URINE MICROSCOPIC-ADD ON

## 2016-03-24 LAB — URINALYSIS, ROUTINE W REFLEX MICROSCOPIC
Glucose, UA: NEGATIVE mg/dL
Hgb urine dipstick: NEGATIVE
Ketones, ur: NEGATIVE mg/dL
Nitrite: NEGATIVE
Protein, ur: NEGATIVE mg/dL
Specific Gravity, Urine: 1.038 — ABNORMAL HIGH (ref 1.005–1.030)
pH: 5 (ref 5.0–8.0)

## 2016-03-24 LAB — I-STAT TROPONIN, ED: Troponin i, poc: 0.09 ng/mL (ref 0.00–0.08)

## 2016-03-24 LAB — I-STAT CG4 LACTIC ACID, ED: Lactic Acid, Venous: 1.38 mmol/L (ref 0.5–1.9)

## 2016-03-24 MED ORDER — ENSURE ENLIVE PO LIQD
237.0000 mL | Freq: Two times a day (BID) | ORAL | Status: DC
  Administered 2016-03-25 – 2016-03-26 (×4): 237 mL via ORAL

## 2016-03-24 MED ORDER — ALPRAZOLAM 0.25 MG PO TABS
0.2500 mg | ORAL_TABLET | Freq: Two times a day (BID) | ORAL | Status: DC | PRN
  Administered 2016-03-24: 0.5 mg via ORAL
  Filled 2016-03-24: qty 2

## 2016-03-24 MED ORDER — TICAGRELOR 90 MG PO TABS
90.0000 mg | ORAL_TABLET | Freq: Two times a day (BID) | ORAL | Status: DC
  Administered 2016-03-24 – 2016-03-26 (×5): 90 mg via ORAL
  Filled 2016-03-24 (×6): qty 1

## 2016-03-24 MED ORDER — PANTOPRAZOLE SODIUM 40 MG PO TBEC
40.0000 mg | DELAYED_RELEASE_TABLET | Freq: Every day | ORAL | Status: DC
  Administered 2016-03-24 – 2016-03-26 (×3): 40 mg via ORAL
  Filled 2016-03-24 (×3): qty 1

## 2016-03-24 MED ORDER — SODIUM CHLORIDE 0.9 % IV BOLUS (SEPSIS)
500.0000 mL | Freq: Once | INTRAVENOUS | Status: AC
  Administered 2016-03-24: 500 mL via INTRAVENOUS

## 2016-03-24 MED ORDER — NICOTINE 10 MG IN INHA: 1.0000 | RESPIRATORY_TRACT | Status: DC | PRN

## 2016-03-24 MED ORDER — LORAZEPAM 2 MG/ML IJ SOLN
0.5000 mg | Freq: Once | INTRAMUSCULAR | Status: AC
  Administered 2016-03-24: 0.5 mg via INTRAVENOUS
  Filled 2016-03-24: qty 1

## 2016-03-24 MED ORDER — ALBUTEROL SULFATE (2.5 MG/3ML) 0.083% IN NEBU: 3.0000 mL | INHALATION_SOLUTION | Freq: Four times a day (QID) | RESPIRATORY_TRACT | Status: DC | PRN

## 2016-03-24 MED ORDER — ASPIRIN EC 325 MG PO TBEC
325.0000 mg | DELAYED_RELEASE_TABLET | Freq: Once | ORAL | Status: AC
  Administered 2016-03-24: 325 mg via ORAL
  Filled 2016-03-24: qty 1

## 2016-03-24 MED ORDER — LEVOTHYROXINE SODIUM 25 MCG PO TABS
25.0000 ug | ORAL_TABLET | Freq: Every day | ORAL | Status: DC
  Administered 2016-03-24 – 2016-03-26 (×3): 25 ug via ORAL
  Filled 2016-03-24 (×3): qty 1

## 2016-03-24 MED ORDER — FLUOXETINE HCL 20 MG PO CAPS
20.0000 mg | ORAL_CAPSULE | Freq: Every day | ORAL | Status: DC
  Administered 2016-03-24 – 2016-03-26 (×3): 20 mg via ORAL
  Filled 2016-03-24 (×3): qty 1

## 2016-03-24 MED ORDER — PREDNISONE 5 MG PO TABS
5.0000 mg | ORAL_TABLET | Freq: Every day | ORAL | Status: DC
  Administered 2016-03-24 – 2016-03-26 (×3): 5 mg via ORAL
  Filled 2016-03-24 (×3): qty 1

## 2016-03-24 MED ORDER — ASPIRIN EC 81 MG PO TBEC
81.0000 mg | DELAYED_RELEASE_TABLET | Freq: Every day | ORAL | Status: DC
  Administered 2016-03-24 – 2016-03-26 (×3): 81 mg via ORAL
  Filled 2016-03-24 (×3): qty 1

## 2016-03-24 MED ORDER — ONDANSETRON HCL 4 MG/2ML IJ SOLN
4.0000 mg | Freq: Four times a day (QID) | INTRAMUSCULAR | Status: DC | PRN
  Administered 2016-03-24 – 2016-03-25 (×3): 4 mg via INTRAVENOUS
  Filled 2016-03-24 (×4): qty 2

## 2016-03-24 MED ORDER — ATORVASTATIN CALCIUM 80 MG PO TABS
80.0000 mg | ORAL_TABLET | Freq: Every day | ORAL | Status: DC
  Administered 2016-03-24 – 2016-03-25 (×2): 80 mg via ORAL
  Filled 2016-03-24 (×2): qty 1

## 2016-03-24 MED ORDER — SODIUM CHLORIDE 0.9 % IV SOLN
INTRAVENOUS | Status: DC
  Administered 2016-03-24 (×2): via INTRAVENOUS

## 2016-03-24 MED ORDER — LISINOPRIL 2.5 MG PO TABS
2.5000 mg | ORAL_TABLET | Freq: Every day | ORAL | Status: DC
  Administered 2016-03-24 – 2016-03-26 (×3): 2.5 mg via ORAL
  Filled 2016-03-24 (×3): qty 1

## 2016-03-24 MED ORDER — ACETAMINOPHEN 325 MG PO TABS
650.0000 mg | ORAL_TABLET | ORAL | Status: DC | PRN
  Administered 2016-03-25 – 2016-03-26 (×2): 650 mg via ORAL
  Filled 2016-03-24 (×2): qty 2

## 2016-03-24 MED ORDER — FAMOTIDINE 20 MG PO TABS
20.0000 mg | ORAL_TABLET | Freq: Two times a day (BID) | ORAL | Status: DC
  Administered 2016-03-24 – 2016-03-26 (×6): 20 mg via ORAL
  Filled 2016-03-24 (×7): qty 1

## 2016-03-24 MED ORDER — FENOFIBRATE 160 MG PO TABS
160.0000 mg | ORAL_TABLET | Freq: Every day | ORAL | Status: DC
  Administered 2016-03-24 – 2016-03-26 (×3): 160 mg via ORAL
  Filled 2016-03-24 (×3): qty 1

## 2016-03-24 MED ORDER — MOMETASONE FURO-FORMOTEROL FUM 100-5 MCG/ACT IN AERO
2.0000 | INHALATION_SPRAY | Freq: Two times a day (BID) | RESPIRATORY_TRACT | Status: DC
  Administered 2016-03-24 – 2016-03-26 (×5): 2 via RESPIRATORY_TRACT
  Filled 2016-03-24: qty 8.8

## 2016-03-24 MED ORDER — ENOXAPARIN SODIUM 40 MG/0.4ML ~~LOC~~ SOLN
40.0000 mg | SUBCUTANEOUS | Status: DC
  Administered 2016-03-24 – 2016-03-26 (×3): 40 mg via SUBCUTANEOUS
  Filled 2016-03-24 (×3): qty 0.4

## 2016-03-24 MED ORDER — FERROUS SULFATE 325 (65 FE) MG PO TABS
325.0000 mg | ORAL_TABLET | Freq: Two times a day (BID) | ORAL | Status: DC
  Administered 2016-03-24 – 2016-03-26 (×4): 325 mg via ORAL
  Filled 2016-03-24 (×4): qty 1

## 2016-03-24 NOTE — Care Management Obs Status (Signed)
[facility] NOTIFICATION   Patient Details  Name: GRETTA WOMBLES MRN: [id number] Date of Birth: Apr 24, 1941   Medicare Observation Status Notification Given:  Yes    Dawayne Patricia, RN 03/24/2016, 11:41 AM

## 2016-03-24 NOTE — Progress Notes (Signed)
Pt arrived from ED accompanied by RN and family. She is alert and oriented with intermittent confusion. Vitals stable, call bell within reach. Family has left for the evening.

## 2016-03-24 NOTE — Progress Notes (Signed)
Patient seen and examined, admitted after midnight secondary to abd pain, nausea and vomiting. Feeling better overall. Still with some nausea and complaining of RLQ. Her vital signs are stable and despite flat elevation of troponin remains CP free. Discussed with cardiology and no further work up anticipated, continue current medication regimen. Please refer to H&P written by Dr. Alcario Drought for further info/details on admission.  Plan: -will continue IVF resuscitation and electrolytes repletion -will ask PT to evaluate -if remains stable, will discharge home in am  Barton Dubois [id number]

## 2016-03-24 NOTE — ED Notes (Signed)
Attempted report 

## 2016-03-24 NOTE — ED Notes (Signed)
Admitting at bedside 

## 2016-03-24 NOTE — H&P (Addendum)
History and Physical    Monica Copeland [id number] DOB: 1941-05-06 DOA: 03/23/2016   PCP: Ann Held, DO Chief Complaint:  Chief Complaint  Patient presents with  . Altered Mental Status  . Abdominal Pain  . Chest Pain    HPI: Monica Copeland is a 75 y.o. female with medical history significant of STEMI in May of this year, PCI stent to RCA.  Other coronaries are widely patent and disease free except RPDA which is 100% occluded.  Patient presents to the ED tonight with c/o N/V, abdominal pain, chest pain.  Symptoms have completely resolved with zofran at this point, she is pain free.  Pain was worst in her RLQ.  Symptoms were constant, lasted some 4 hours before resolution.  Vomit was NBNB.  No fever.    ED Course: Troponin 0.10 and 3 hours later was 0.09.  Due to recent STEMI, EDP dosent want to take a chance and requests admission.  Review of Systems: As per HPI otherwise 10 point review of systems negative.    Past Medical History:  Diagnosis Date  . Anxiety   . Asthma   . Asthma   . Back pain   . 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 Diabetes mellitus without complication ([place])   . Emphysema of lung ([place])   . Gastric AVM   . GERD (gastroesophageal reflux disease) 01/24/2012  . Hemorrhoid   . Hyperlipemia   . Hyperlipidemia   . Hypertension   . IBS (irritable bowel syndrome)   . [place] deficiency anemia   . Seizures ([place])   . Ulcer     Past Surgical History:  Procedure Laterality Date  . ABD[place]CTOMY     [place]  . APPENDECTOMY    . 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 candida esophagitis    . CARDIAC CATHETERIZATION N/A 01/05/2016   Procedure: Left Heart Cath and Coronary Angiography;  Surgeon: Troy Sine, MD;  Location: [facility];  Service: Cardiovascular;  Laterality: N/A;  . CARDIAC CATHETERIZATION N/A 01/05/2016   Procedure: Coronary Stent Intervention;  Surgeon: Troy Sine, MD;  Location: [facility];  Service:  Cardiovascular;  Laterality: N/A;  . CARDIAC CATHETERIZATION N/A 01/05/2016   Procedure: Temporary Pacemaker;  Surgeon: Troy Sine, MD;  Location: [facility];  Service: Cardiovascular;  Laterality: N/A;  . COLON SURGERY     colon perforation  . pylorus ulcer       reports that she has been smoking Cigarettes.  She has been smoking about 0.00 packs per day. She has never used smokeless tobacco. She reports that she does not drink alcohol or use drugs.  Allergies  Allergen Reactions  . Tomato Rash    Family History  Problem Relation Age of Onset  . Hypertension Mother   . Heart attack Mother   . Diabetes Father   . Asthma Father   . Lung cancer Father   . Hypertension Brother   . Asthma Son   . Esophageal cancer Brother   . Liver cancer Brother     mets from esophagus  . Colon cancer Neg Hx       Prior to Admission medications   Medication Sig Start Date End Date Taking? Authorizing Provider  albuterol (PROAIR HFA) 108 (90 Base) MCG/ACT inhaler Inhale 1-2 puffs into the lungs every 6 (six) hours as needed for wheezing or shortness of breath. 01/13/16   Ann Held,  DO  ALPRAZolam (XANAX) 0.5 MG tablet TAKE 1/2 TO 1 TABLET BY MOUTH 2 TIMES A DAY AS NEEDED FOR ANXIETY. 03/09/16   Rosalita Chessman Chase, DO  aspirin EC 81 MG EC tablet Take 1 tablet (81 mg total) by mouth daily. 01/08/16   Arbutus Leas, NP  atorvastatin (LIPITOR) 80 MG tablet Take 80 mg by mouth daily at 6 PM.  01/08/16   Historical Provider, MD  budesonide-formoterol (SYMBICORT) 80-4.5 MCG/ACT inhaler Inhale 2 puffs into the lungs daily as needed. For shortness of breath 01/13/16   Rosalita Chessman Chase, DO  diclofenac sodium (VOLTAREN) 1 % GEL Apply 4 g topically 4 (four) times daily. 01/13/16   Alferd Apa Lowne Chase, DO  diphenhydramine-acetaminophen (TYLENOL PM) 25-500 MG TABS tablet Take 1 tablet by mouth at bedtime as needed. For sleep    Historical Provider, MD  famotidine (PEPCID) 20 MG tablet Take  1 tablet (20 mg total) by mouth 2 (two) times daily. 08/07/14   Robbie Lis, MD  feeding supplement, ENSURE COMPLETE, (ENSURE COMPLETE) LIQD Take 237 mLs by mouth 2 (two) times daily between meals. 08/06/14   Robbie Lis, MD  fenofibrate 160 MG tablet TAKE 1 TABLET BY MOUTH ONCE DAILY 01/14/16   Rosalita Chessman Chase, DO  ferrous sulfate 325 (65 FE) MG tablet TAKE 1 TABLET BY MOUTH TWICE A DAY BEFORE LUNCH AND SUPPER 07/20/15   Alferd Apa Lowne Chase, DO  FLUoxetine (PROZAC) 20 MG capsule TAKE 1 CAPSULE BY MOUTH ONCE DAILY 08/20/15   Alferd Apa Lowne Chase, DO  hyoscyamine (LEVSIN SL) 0.125 MG SL tablet PLACE 1 TABLET UNDER THE TONGUE EVERY 4 HOURS AS NEEDED. 09/27/15   Rosalita Chessman Chase, DO  levothyroxine (SYNTHROID, LEVOTHROID) 25 MCG tablet Take 1 tablet (25 mcg total) by mouth daily before breakfast. 08/06/14   Robbie Lis, MD  lisinopril (PRINIVIL,ZESTRIL) 2.5 MG tablet Take 1 tablet (2.5 mg total) by mouth daily. 01/08/16   Arbutus Leas, NP  NICOTROL 10 MG inhaler INHALE 1 CARTRIDGE (1 CONTINUOUS PUFFING TOTAL) INTO THE LUNGS AS NEEDED FOR SMOKING CESSATION UP TO MAX 16 CARTRIDGES PER DAY. 03/03/16   Yvonne R Lowne Chase, DO  pantoprazole (PROTONIX) 40 MG tablet Take 1 tablet (40 mg total) by mouth daily. 01/08/16   Arbutus Leas, NP  predniSONE (DELTASONE) 5 MG tablet Take 1 tablet (5 mg total) by mouth daily. 10/12/15   Renato Shin, MD  ticagrelor (BRILINTA) 90 MG TABS tablet Take 1 tablet (90 mg total) by mouth 2 (two) times daily. 01/08/16   Arbutus Leas, NP    Physical Exam: Vitals:   03/23/16 2300 03/23/16 2330 03/24/16 0045 03/24/16 0145  BP: 103/57 (!) 95/50 124/59 111/63  Pulse: 82 76  76  Resp: 18 17  18   Temp:      TempSrc:      SpO2: 98% 100%  100%  Weight:      Height:          Constitutional: NAD, calm, comfortable Eyes: PERRL, lids and conjunctivae normal ENMT: Mucous membranes are moist. Posterior pharynx clear of any exudate or lesions.Normal dentition.  Neck:  normal, supple, no masses, no thyromegaly Respiratory: clear to auscultation bilaterally, no wheezing, no crackles. Normal respiratory effort. No accessory muscle use.  Cardiovascular: Regular rate and rhythm, no murmurs / rubs / gallops. No extremity edema. 2+ pedal pulses. No carotid bruits.  Abdomen: no tenderness, no masses palpated. No hepatosplenomegaly. Bowel sounds  positive.  Musculoskeletal: no clubbing / cyanosis. No joint deformity upper and lower extremities. Good ROM, no contractures. Normal muscle tone.  Skin: no rashes, lesions, ulcers. No induration Neurologic: CN 2-12 grossly intact. Sensation intact, DTR normal. Strength 5/5 in all 4.  Psychiatric: Normal judgment and insight. Alert and oriented x 3. Normal mood.    Labs on Admission: I have personally reviewed following labs and imaging studies  CBC:  Recent Labs Lab 03/23/16 2119  WBC 9.9  HGB 14.1  HCT 42.9  MCV 93.7  PLT [id number]   Basic Metabolic Panel:  Recent Labs Lab 03/23/16 2119  NA 136  K 3.5  CL 107  CO2 21*  GLUCOSE 88  BUN 18  CREATININE 1.15*  CALCIUM 8.6*   GFR: Estimated Creatinine Clearance: 35 mL/min (by C-G formula based on SCr of 1.15 mg/dL). Liver Function Tests:  Recent Labs Lab 03/23/16 2119  AST 41  ALT 22  ALKPHOS 66  BILITOT 0.4  PROT 5.9*  ALBUMIN 3.0*    Recent Labs Lab 03/23/16 2206  LIPASE 21   No results for input(s): AMMONIA in the last 168 hours. Coagulation Profile: No results for input(s): INR, PROTIME in the last 168 hours. Cardiac Enzymes:  Recent Labs Lab 03/23/16 2139  TROPONINI 0.10*   BNP (last 3 results) No results for input(s): PROBNP in the last 8760 hours. HbA1C: No results for input(s): HGBA1C in the last 72 hours. CBG:  Recent Labs Lab 03/23/16 2113  GLUCAP 86   Lipid Profile: No results for input(s): CHOL, HDL, LDLCALC, TRIG, CHOLHDL, LDLDIRECT in the last 72 hours. Thyroid Function Tests: No results for input(s): TSH,  T4TOTAL, FREET4, T3FREE, THYROIDAB in the last 72 hours. Anemia Panel: No results for input(s): VITAMINB12, FOLATE, FERRITIN, TIBC, IRON, RETICCTPCT in the last 72 hours. Urine analysis:    Component Value Date/Time   COLORURINE AMBER (A) 03/23/2016 2305   APPEARANCEUR CLOUDY (A) 03/23/2016 2305   LABSPEC 1.038 (H) 03/23/2016 2305   PHURINE 5.0 03/23/2016 2305   GLUCOSEU NEGATIVE 03/23/2016 2305   HGBUR NEGATIVE 03/23/2016 2305   BILIRUBINUR SMALL (A) 03/23/2016 2305   BILIRUBINUR neg 09/21/2015 1640   KETONESUR NEGATIVE 03/23/2016 2305   PROTEINUR NEGATIVE 03/23/2016 2305   UROBILINOGEN 0.2 09/21/2015 1640   UROBILINOGEN 0.2 08/04/2014 1306   NITRITE NEGATIVE 03/23/2016 2305   LEUKOCYTESUR TRACE (A) 03/23/2016 2305   Sepsis Labs: @LABRCNTIP (procalcitonin:4,lacticidven:4) )No results found for this or any previous visit (from the past 240 hour(s)).   Radiological Exams on Admission: Ct Head Wo Contrast  Result Date: 03/24/2016 CLINICAL DATA:  Altered mental status EXAM: CT HEAD WITHOUT CONTRAST TECHNIQUE: Contiguous axial images were obtained from the base of the skull through the vertex without intravenous contrast. COMPARISON:  08/04/2014; 04/24/2012; brain MRI - 08/05/2014. FINDINGS: Brain: Similar findings of advanced atrophy with sulcal prominence centralized volume loss with commensurate ex vacuo dilatation of the ventricular system. Scattered periventricular hypodensities compatible microvascular ischemic disease. Lacunar infarct versus prominent VR space within the caudal aspect the left basal ganglia image 10 series 2). Right-sided basal ganglial calcifications. No CT evidence of acute large territory infarct. No intraparenchymal or extra-axial mass or hemorrhage. Unchanged size a configuration of the ventricles and basilar cisterns. No midline shift Vascular: Intracranial atherosclerosis. Skull: No displaced calvarial fracture. Sinuses/Orbits: There is underpneumatization of the  right frontal sinus. The remaining paranasal sinuses and mastoid air cells are normally aerated. No air-fluid levels. Post bilateral cataract surgery. Other: Regional soft tissues appear normal. IMPRESSION:  Similar findings of advanced atrophy and mild microvascular ischemic disease without acute intracranial process. Electronically Signed   By: Sandi Mariscal M.D.   On: 03/24/2016 00:50  Ct Abdomen Pelvis W Contrast  Result Date: 03/24/2016 CLINICAL DATA:  75 year old female with abdominal pain. History of ulcer, and IBS. EXAM: CT ABDOMEN AND PELVIS WITH CONTRAST TECHNIQUE: Multidetector CT imaging of the abdomen and pelvis was performed using the standard protocol following bolus administration of intravenous contrast. CONTRAST:  1 ISOVUE-300 IOPAMIDOL (ISOVUE-300) INJECTION 61% COMPARISON:  Abdominal CT dated 10/11/2015 FINDINGS: Minimal bibasilar dependent atelectatic changes of the lungs. The visualized lung bases are otherwise clear. There is coronary vascular calcification. No intra-abdominal free air or free fluid. Mild apparent fatty infiltration of the liver. Focal high attenuating area at the gallbladder fundus may represent stone or focal adenomyomatosis. Ultrasound may provide better evaluation of the gallbladder. No pericholecystic fluid. The pancreas, spleen, adrenal glands, left kidney appear unremarkable. Subcentimeter right renal hypodense lesions are too small to characterize but appears stable compared to prior study and likely represent cysts. Ultrasound may provide better characterization. No hydronephrosis. The visualized ureters and urinary bladder appear unremarkable. Hysterectomy. There is postsurgical changes of partial colon resection with anastomotic suture line in the sigmoid colon. There is no evidence of bowel obstruction or active inflammation. R The appendix is not visualized with certainty. No inflammatory changes identified in the right lower quadrant. There is moderate  aortoiliac atherosclerotic disease. The origins of the celiac axis, SMA, IMA as well as the origins of the renal arteries appear patent. The SMV, splenic vein, and main portal veins are patent. No portal venous gas identified. There is no adenopathy. There is a midline vertical anterior pelvic wall incisional scar. The abdominal wall soft tissues are otherwise unremarkable. There is osteopenia with degenerative changes of the spine. No acute fracture. IMPRESSION: Focal adenomyomatosis of the gallbladder fundus versus gallstone. Ultrasound may provide better evaluation of the gallbladder. No other acute intra-abdominal or pelvic pathology identified. Electronically Signed   By: Anner Crete M.D.   On: 03/24/2016 01:01  Dg Chest Portable 1 View  Result Date: 03/23/2016 CLINICAL DATA:  Vomiting and chest pain for 1 day. EXAM: PORTABLE CHEST 1 VIEW COMPARISON:  01/05/2016; 06/23/2015; 08/04/2014 FINDINGS: Grossly unchanged cardiac silhouette and mediastinal contours with slight differences likely total to decreased lung volumes. Worsening bilateral infrahilar heterogeneous opacities, favored to represent atelectasis. No discrete focal airspace opacities. No pleural effusion pneumothorax. No evidence of edema. No acute osseus abnormalities. IMPRESSION: Perihilar atelectasis without definitive acute cardiopulmonary disease on this hypoventilated AP portable examination. Further evaluation with a PA and lateral chest radiograph may be obtained as clinically indicated. Electronically Signed   By: Sandi Mariscal M.D.   On: 03/23/2016 21:45   EKG: Independently reviewed.  Assessment/Plan Principal Problem:   Right lower quadrant abdominal pain Active Problems:   Emesis    RLQ abdominal pain with emesis -  Resolved  Likely was a gastroenteritis of some sort, doubt ACS but as I explained to patient and family, no one wants to take a chance on that a mere 2 months out from a STEMI  Will put patient on CP obs  pathway  Tele monitor  Serial trops  Cardiology suspects that the 0.10 may be her baseline since the STEMI (not known what it is).  Will admit for overnight obs  If trops negative, probably okay for DC in AM if remains asymptomatic.   DVT prophylaxis: Lovenox Code Status: Full Family  Communication: Family at bedside Consults called: None Admission status: Admit to obs   Etta Quill DO [facility] Pager [phone] from 7PM-7AM  If 7AM-7PM, please contact the day physician for the patient [email or web address] Password [email or web address]  03/24/2016, 1:58 AM

## 2016-03-25 DIAGNOSIS — R748 Abnormal levels of other serum enzymes: Secondary | ICD-10-CM | POA: Diagnosis not present

## 2016-03-25 DIAGNOSIS — K219 Gastro-esophageal reflux disease without esophagitis: Secondary | ICD-10-CM

## 2016-03-25 DIAGNOSIS — J438 Other emphysema: Secondary | ICD-10-CM

## 2016-03-25 DIAGNOSIS — I10 Essential (primary) hypertension: Secondary | ICD-10-CM

## 2016-03-25 DIAGNOSIS — R4182 Altered mental status, unspecified: Secondary | ICD-10-CM | POA: Diagnosis not present

## 2016-03-25 DIAGNOSIS — R1031 Right lower quadrant pain: Secondary | ICD-10-CM | POA: Diagnosis not present

## 2016-03-25 DIAGNOSIS — R7989 Other specified abnormal findings of blood chemistry: Secondary | ICD-10-CM | POA: Diagnosis not present

## 2016-03-25 DIAGNOSIS — N179 Acute kidney failure, unspecified: Secondary | ICD-10-CM | POA: Diagnosis not present

## 2016-03-25 DIAGNOSIS — E785 Hyperlipidemia, unspecified: Secondary | ICD-10-CM

## 2016-03-25 DIAGNOSIS — R112 Nausea with vomiting, unspecified: Secondary | ICD-10-CM | POA: Diagnosis not present

## 2016-03-25 DIAGNOSIS — R5381 Other malaise: Secondary | ICD-10-CM

## 2016-03-25 LAB — BASIC METABOLIC PANEL
Anion gap: 7 (ref 5–15)
BUN: 6 mg/dL (ref 6–20)
CHLORIDE: 108 mmol/L (ref 101–111)
CO2: 22 mmol/L (ref 22–32)
Calcium: 7.7 mg/dL — ABNORMAL LOW (ref 8.9–10.3)
Creatinine, Ser: 0.84 mg/dL (ref 0.44–1.00)
GFR calc Af Amer: >60 mL/min (ref >60)
GFR calc non Af Amer: >60 mL/min (ref >60)
GLUCOSE: 95 mg/dL (ref 65–99)
POTASSIUM: 3.4 mmol/L — AB (ref 3.5–5.1)
Sodium: 137 mmol/L (ref 135–145)

## 2016-03-25 LAB — CBC
HEMATOCRIT: 34.1 % — AB (ref 36.0–46.0)
HEMOGLOBIN: 11.1 g/dL — AB (ref 12.0–15.0)
MCH: 30.3 pg (ref 26.0–34.0)
MCHC: 32.6 g/dL (ref 30.0–36.0)
MCV: 93.2 fL (ref 78.0–100.0)
Platelets: 288 10*3/uL (ref 150–400)
RBC: 3.66 MIL/uL — AB (ref 3.87–5.11)
RDW: 13.5 % (ref 11.5–15.5)
WBC: 9 10*3/uL (ref 4.0–10.5)

## 2016-03-25 LAB — VITAMIN B12: VITAMIN B 12: 1321 pg/mL — AB (ref 180–914)

## 2016-03-25 LAB — TSH: TSH: 0.342 u[IU]/mL — ABNORMAL LOW (ref 0.350–4.500)

## 2016-03-25 MED ORDER — NICOTINE 21 MG/24HR TD PT24
21.0000 mg | MEDICATED_PATCH | Freq: Every day | TRANSDERMAL | Status: DC
  Administered 2016-03-25 – 2016-03-26 (×2): 21 mg via TRANSDERMAL
  Filled 2016-03-25 (×2): qty 1

## 2016-03-25 MED ORDER — GUAIFENESIN-DM 100-10 MG/5ML PO SYRP
5.0000 mL | ORAL_SOLUTION | ORAL | Status: DC | PRN
  Administered 2016-03-25 (×2): 5 mL via ORAL
  Filled 2016-03-25 (×2): qty 5

## 2016-03-25 MED ORDER — POTASSIUM CHLORIDE CRYS ER 20 MEQ PO TBCR
40.0000 meq | EXTENDED_RELEASE_TABLET | Freq: Once | ORAL | Status: AC
  Administered 2016-03-25: 40 meq via ORAL
  Filled 2016-03-25: qty 2

## 2016-03-25 MED ORDER — HALOPERIDOL LACTATE 5 MG/ML IJ SOLN: 0.2500 mg | Freq: Three times a day (TID) | INTRAMUSCULAR | Status: DC | PRN

## 2016-03-25 MED ORDER — MORPHINE SULFATE (PF) 2 MG/ML IV SOLN
2.0000 mg | Freq: Once | INTRAVENOUS | Status: AC
  Administered 2016-03-25: 2 mg via INTRAVENOUS
  Filled 2016-03-25: qty 1

## 2016-03-25 MED ORDER — ALPRAZOLAM 0.25 MG PO TABS: 0.2500 mg | ORAL_TABLET | Freq: Two times a day (BID) | ORAL | Status: DC | PRN

## 2016-03-25 NOTE — Progress Notes (Signed)
[facility] PROGRESS NOTE  TRISTACA DADDIO [id number] DOB: Jan 15, 1941 DOA: 03/23/2016 PCP: Ann Held, DO  Interim summary and HPI 75 y.o. female with medical history significant of STEMI in May of this year, PCI stent to RCA. Other coronaries are widely patent and disease free except RPDA which is 100% occluded. Patient presents to the ED tonight with c/o N/V, abdominal pain, chest pain.  Symptoms have completely resolved with zofran at this point, she is pain free.  Pain was worst in her RLQ. Symptoms were constant, lasted some 4 hours before resolution.  Vomit was NBNB.  No fever.  Admitted for further evaluation of elevated troponin, abd pain and AMS.  Assessment/Plan: 1-altered mental status: -per daughter intermittent and ongoing for the last couple of months. -with concerns for dementia  -will check B12 and TSH -CT scan w/o acute intracranial abnormalities -will minimize sedation medications  -provide supportive care  2-elevated troponin  -no frank CP described; but given recent MI and stenting cardiology was curbside -her troponin were flatly elevated and despite her stented artery (RA) and a completely occluded RPDA, the rest of her cath was clean -cardiology recommended continue medical management and no further work up  3-AKI: -secondary to dehydration and hypotensive episode -essentially resolved with IVF's and holding antihypertensive agents -will monitor and resume home meds again  4-nausea/vomiting apparently has been chronically intermittent -per daughter patient with IBS -will continue symptomatic management  -no abnormalities seen on CT abd/pelvis  5-anxiety and depression -will continue PRN low dose xanax -continue lexapro  6-HTN -BP soft to low on presentation -rising now -ok to continue very low dose of lisinopril  7-hypothyroidism  -will check TSH -continue synthroid  8-GERD -will continue PPI  9-HLD -continue statins    10-hx of COPD -no wheezing on exam -will continue Dulera, PRN albuterol and chronic low dose prednisone   11-physical deconditioning  -PT recommended HH services and supervision 24/7 -24/7 assistance and care not available and unsafe for patient to be discharge straight home -will pursuit rehabilitation at SNF   Code Status: Full Family Communication: daughter at bedside  Disposition Plan: after discussing with daughter and given inability of 24/7 supervision, will pursuit placement for rehabilitation in a SNF.   Consultants:  Cardiology curbside (recommending no further work up at this point; continue medical management)   Physical Therapy   Procedures:  See below for x-ray reports   Antibiotics:  None   HPI/Subjective: Afebrile, no CP or SOB. Patient pleasantly confused and with poor insight. Overall weak/frail and with off balance as per PT reports.  Objective: Vitals:   03/25/16 1124 03/25/16 1810  BP: (!) 115/53 110/65  Pulse:  74  Resp:  18  Temp:  97.5 F (36.4 C)    Intake/Output Summary (Last 24 hours) at 03/25/16 1906 Last data filed at 03/25/16 0801  Gross per 24 hour  Intake              240 ml  Output              401 ml  Net             -161 ml   Filed Weights   03/23/16 2053 03/24/16 0251  Weight: 59 kg (130 lb) 56.3 kg (124 lb 1.9 oz)    Exam:   General:  Afebrile, oriented to person and intermittently to place; pleasantly confused and with poor insight. Denies CP and SOB.  Cardiovascular: S1 and S2, no rubs  or gallops; no JVD  Respiratory: good air movement, no wheezing, no crackles  Abdomen: soft, mild tenderness to deep palpation on RLQ. Positive BS  Musculoskeletal: no edema or cyanosis   Data Reviewed: Basic Metabolic Panel:  Recent Labs Lab 03/23/16 2119 03/25/16 0322  NA 136 137  K 3.5 3.4*  CL 107 108  CO2 21* 22  GLUCOSE 88 95  BUN 18 6  CREATININE 1.15* 0.84  CALCIUM 8.6* 7.7*   Liver Function  Tests:  Recent Labs Lab 03/23/16 2119  AST 41  ALT 22  ALKPHOS 66  BILITOT 0.4  PROT 5.9*  ALBUMIN 3.0*    Recent Labs Lab 03/23/16 2206  LIPASE 21   CBC:  Recent Labs Lab 03/23/16 2119 03/25/16 0322  WBC 9.9 9.0  HGB 14.1 11.1*  HCT 42.9 34.1*  MCV 93.7 93.2  PLT 313 288   Cardiac Enzymes:  Recent Labs Lab 03/23/16 2139 03/24/16 0156 03/24/16 0330 03/24/16 0650  TROPONINI 0.10* 0.11* 0.12* 0.14*   BNP (last 3 results)  Recent Labs  03/23/16 1941  BNP 41.5   CBG:  Recent Labs Lab 03/23/16 2113  GLUCAP 86   Studies: Ct Head Wo Contrast  Result Date: 03/24/2016 CLINICAL DATA:  Altered mental status EXAM: CT HEAD WITHOUT CONTRAST TECHNIQUE: Contiguous axial images were obtained from the base of the skull through the vertex without intravenous contrast. COMPARISON:  08/04/2014; 04/24/2012; brain MRI - 08/05/2014. FINDINGS: Brain: Similar findings of advanced atrophy with sulcal prominence centralized volume loss with commensurate ex vacuo dilatation of the ventricular system. Scattered periventricular hypodensities compatible microvascular ischemic disease. Lacunar infarct versus prominent VR space within the caudal aspect the left basal ganglia image 10 series 2). Right-sided basal ganglial calcifications. No CT evidence of acute large territory infarct. No intraparenchymal or extra-axial mass or hemorrhage. Unchanged size a configuration of the ventricles and basilar cisterns. No midline shift Vascular: Intracranial atherosclerosis. Skull: No displaced calvarial fracture. Sinuses/Orbits: There is underpneumatization of the right frontal sinus. The remaining paranasal sinuses and mastoid air cells are normally aerated. No air-fluid levels. Post bilateral cataract surgery. Other: Regional soft tissues appear normal. IMPRESSION: Similar findings of advanced atrophy and mild microvascular ischemic disease without acute intracranial process. Electronically Signed    By: Sandi Mariscal M.D.   On: 03/24/2016 00:50  Ct Abdomen Pelvis W Contrast  Result Date: 03/24/2016 CLINICAL DATA:  75 year old female with abdominal pain. History of ulcer, and IBS. EXAM: CT ABDOMEN AND PELVIS WITH CONTRAST TECHNIQUE: Multidetector CT imaging of the abdomen and pelvis was performed using the standard protocol following bolus administration of intravenous contrast. CONTRAST:  1 ISOVUE-300 IOPAMIDOL (ISOVUE-300) INJECTION 61% COMPARISON:  Abdominal CT dated 10/11/2015 FINDINGS: Minimal bibasilar dependent atelectatic changes of the lungs. The visualized lung bases are otherwise clear. There is coronary vascular calcification. No intra-abdominal free air or free fluid. Mild apparent fatty infiltration of the liver. Focal high attenuating area at the gallbladder fundus may represent stone or focal adenomyomatosis. Ultrasound may provide better evaluation of the gallbladder. No pericholecystic fluid. The pancreas, spleen, adrenal glands, left kidney appear unremarkable. Subcentimeter right renal hypodense lesions are too small to characterize but appears stable compared to prior study and likely represent cysts. Ultrasound may provide better characterization. No hydronephrosis. The visualized ureters and urinary bladder appear unremarkable. Hysterectomy. There is postsurgical changes of partial colon resection with anastomotic suture line in the sigmoid colon. There is no evidence of bowel obstruction or active inflammation. R The appendix is not visualized  with certainty. No inflammatory changes identified in the right lower quadrant. 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 The origins of the celiac axis, SMA, IMA as well as the origins of the renal arteries appear patent. The SMV, splenic vein, and main portal veins are patent. No portal venous gas identified. There is no adenopathy. There is a midline vertical anterior pelvic wall incisional scar. The abdominal wall soft tissues are  otherwise unremarkable. There is osteopenia with degenerative changes of the spine. No acute fracture. IMPRESSION: Focal adenomyomatosis of the gallbladder fundus versus gallstone. Ultrasound may provide better evaluation of the gallbladder. No other acute intra-abdominal or pelvic pathology identified. Electronically Signed   By: Anner Crete M.D.   On: 03/24/2016 01:01  Dg Chest Portable 1 View  Result Date: 03/23/2016 CLINICAL DATA:  Vomiting and chest pain for 1 day. EXAM: PORTABLE CHEST 1 VIEW COMPARISON:  01/05/2016; 06/23/2015; 08/04/2014 FINDINGS: Grossly unchanged cardiac silhouette and mediastinal contours with slight differences likely total to decreased lung volumes. Worsening bilateral infrahilar heterogeneous opacities, favored to represent atelectasis. No discrete focal airspace opacities. No pleural effusion pneumothorax. No evidence of edema. No acute osseus abnormalities. IMPRESSION: Perihilar atelectasis without definitive acute cardiopulmonary disease on this hypoventilated AP portable examination. Further evaluation with a PA and lateral chest radiograph may be obtained as clinically indicated. Electronically Signed   By: Sandi Mariscal M.D.   On: 03/23/2016 21:45   Scheduled Meds: . aspirin EC  81 mg Oral Daily  . atorvastatin  80 mg Oral q1800  . enoxaparin (LOVENOX) injection  40 mg Subcutaneous Q24H  . famotidine  20 mg Oral BID  . feeding supplement (ENSURE ENLIVE)  237 mL Oral BID BM  . fenofibrate  160 mg Oral Daily  . ferrous sulfate  325 mg Oral BID WC  . FLUoxetine  20 mg Oral Daily  . levothyroxine  25 mcg Oral QAC breakfast  . lisinopril  2.5 mg Oral Daily  . mometasone-formoterol  2 puff Inhalation BID  . nicotine  21 mg Transdermal Daily  . pantoprazole  40 mg Oral Daily  . predniSONE  5 mg Oral Daily  . ticagrelor  90 mg Oral BID   Continuous Infusions:   Principal Problem:   Right lower quadrant abdominal pain Active Problems:   Emesis   Elevated  troponin    Time spent: 30 minutes    Barton Dubois  [facility] Pager [phone]. If 7PM-7AM, please contact night-coverage at [email or web address], password [email or web address] 03/25/2016, 7:06 PM  LOS: 0 days

## 2016-03-25 NOTE — Evaluation (Addendum)
Physical Therapy Evaluation Patient Details Name: Monica Copeland MRN: [id number] DOB: 1940/09/13 Today's Date: 03/25/2016   History of Present Illness  75 y.o. female admitted to [facility] on 03/23/16 for AMS, abdominal pain, and chest pain.  Cardiac workup in progress.  Serial triponins ordered.  Pt with recent, significant PMHx of STEMI (2 months PTA).  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  Clinical Impression  Pt is lethargic, and took some time to wake up for our session.  She did walk a short distance around the room with min assist, but given her balance deficits and her confusion, I would not send her home without 24/7 assist.  Per RN she was given quite a bit of medication to calm her down to keep her from crawling out of the bed last night.  She thinks she is at home this morning as she keeps asking me "where is the dog" even after I told her twice that she is at [facility].  Today may not be the best picture of her baseline, so PT to come by tomorrow and re-assess.     Follow Up Recommendations Home health PT;Supervision/Assistance - 24 hour    Equipment Recommendations  None recommended by PT    Recommendations for Other Services   NA    Precautions / Restrictions Precautions Precautions: Fall Precaution Comments: pt is mildly unsteady on her feet Restrictions Weight Bearing Restrictions: No      Mobility  Bed Mobility Overal bed mobility: Modified Independent                Transfers Overall transfer level: Needs assistance Equipment used: None Transfers: Sit to/from Stand Sit to Stand: Min guard         General transfer comment: min guard assist for safety and balance, pt is often on her feet before therapist is ready for her to be up.   Ambulation/Gait Ambulation/Gait assistance: Min assist Ambulation Distance (Feet): 25 Feet Assistive  device: None Gait Pattern/deviations: Step-through pattern;Leaning posteriorly;Staggering left;Staggering right     General Gait Details: Pt with sway back posture and, at times, leans posteriorly.  She also staggers when turning her head during gait.  At this time she is a very high fall risk of not supervised.                      Balance Overall balance assessment: Needs assistance Sitting-balance support: Feet supported;No upper extremity supported Sitting balance-Leahy Scale: Good     Standing balance support: No upper extremity supported Standing balance-Leahy Scale: Fair                               Pertinent Vitals/Pain Pain Assessment: No/denies pain    Home Living Family/patient expects to be discharged to:: Private residence Living Arrangements: Children (son) Available Help at Discharge: Family;Other (Comment) (per pt son works)           Actor: Environmental consultant - 2 wheels Additional Comments: Pt is a poor historian on eval, will need to confirm information gathered    Prior Function Level of Independence: Independent         Comments: per pt report she doesn't use an assistive device        Extremity/Trunk Assessment   Upper Extremity Assessment: Overall WFL for tasks assessed  Lower Extremity Assessment: Generalized weakness      Cervical / Trunk Assessment: Kyphotic  Communication   Communication: Other (comment) (mumbling a lot today)  Cognition Arousal/Alertness: Lethargic;Suspect due to medications Behavior During Therapy: Impulsive Overall Cognitive Status: No family/caregiver present to determine baseline cognitive functioning Area of Impairment: Orientation;Attention;Safety/judgement;Awareness;Problem solving;Memory Orientation Level: Disoriented to;Place;Situation Current Attention Level: Sustained Memory: Decreased short-term memory   Safety/Judgement: Decreased awareness of safety;Decreased  awareness of deficits Awareness: Intellectual Problem Solving: Slow processing;Difficulty sequencing;Requires verbal cues;Requires tactile cues General Comments: Pt is quick to move, and keeps asking, "where's the dog" even after repeated orientation to the fact that she is in the hospital.               Assessment/Plan    PT Assessment Patient needs continued PT services  PT Diagnosis Difficulty walking;Abnormality of gait;Generalized weakness;Altered mental status   PT Problem List Decreased strength;Decreased activity tolerance;Decreased balance;Decreased mobility;Decreased cognition;Decreased knowledge of use of DME;Decreased safety awareness  PT Treatment Interventions DME instruction;Gait training;Stair training;Functional mobility training;Therapeutic activities;Therapeutic exercise;Balance training;Neuromuscular re-education;Patient/family education;Cognitive remediation   PT Goals (Current goals can be found in the Care Plan section) Acute Rehab PT Goals Patient Stated Goal: to go home PT Goal Formulation: With patient Time For Goal Achievement: 04/08/16 Potential to Achieve Goals: Good    Frequency Min 3X/week   Barriers to discharge Decreased caregiver support son works?       End of Session   Activity Tolerance: Patient tolerated treatment well Patient left: in chair;with call bell/phone within reach;with nursing/sitter in room           Time: 0844-0906 PT Time Calculation (min) (ACUTE ONLY): 22 min   Charges:   PT Evaluation $PT Eval Moderate Complexity: 1 Procedure       29-Mar-2016 0913  PT G-Codes **NOT FOR INPATIENT CLASS**  Functional Assessment Tool Used assist level  Functional Limitation Mobility: Walking and moving around  Mobility: Walking and Moving Around Current Status [id number]) CJ  Mobility: Walking and Moving Around Goal Status [id number]) CI        Wells Guiles B. [place], [place], DPT [phone]   March 29, 2016, 9:22 AM

## 2016-03-25 NOTE — NC FL2 (Signed)
[place] LEVEL OF CARE SCREENING TOOL     IDENTIFICATION  Patient Name: Monica Copeland Birthdate: Jul 17, 1941 Sex: female Admission Date (Current Location): 03/23/2016  [facility] and [place] Number:  Herbalist and Address:  [facility] [place] [address], [place], [place] [address]      Provider Number: [id number]  Attending Physician Name and Address:  Barton Dubois, MD  Relative Name and Phone Number:       Current Level of Care: Hospital Recommended Level of Care: [place] Prior Approval Number:    Date Approved/Denied:   PASRR Number: [id number]  Discharge Plan: SNF    Current Diagnoses: Patient Active Problem List   Diagnosis Date Noted  . Emesis 03/24/2016  . Right lower quadrant abdominal pain 03/24/2016  . Elevated troponin   . Right arm pain 02/22/2016  . Symptomatic bradycardia 01/19/2016  . ST elevation (STEMI) myocardial infarction involving right coronary artery ([place]) 01/05/2016  . HTN (hypertension) 01/05/2016  . VF (ventricular fibrillation) ([place])   . Bradycardia   . Balance problem 04/19/2015  . Knee pain 01/18/2015  . Breast pain in female 11/16/2014  . Malnutrition of moderate degree ([place]) 08/05/2014  . Abdominal pain 08/04/2014  . Altered mental status 08/04/2014  . Acute encephalopathy 08/04/2014  . UTI (urinary tract infection) 07/15/2014  . Diverticulitis of colon without hemorrhage 07/15/2014  . Diarrhea 07/15/2014  . Tobacco use disorder 10/28/2013  . Plantar fasciitis, bilateral 05/08/2013  . Memory loss 04/10/2013  . 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 Renal insufficiency 12/12/2012  . Anxiety and depression 11/29/2012  . Bronchitis with asthma, acute 10/26/2012  . Hip pain, bilateral 09/12/2012  . Hallucination 09/12/2012  . TIA (transient ischemic attack) 04/25/2012  . Hemiplegia, unspecified, affecting nondominant side 04/25/2012  . COPD with asthma ([place])  04/15/2012  . Cushing syndrome ([place]) 03/31/2012  . Gastric ulcer with hemorrhage 01/24/2012  . Candida esophagitis ([place]) 01/24/2012  . Esophageal candidiasis ([place]) 01/24/2012  . Pyloric ulcer 01/24/2012  . Blood in stool 01/23/2012  . Acute posthemorrhagic anemia 01/23/2012  . Dysphagia, unspecified(787.20) 01/23/2012  . Tachycardia 01/21/2012  . Chronic back pain 01/21/2012  . H/O brain surgery 01/21/2012  . Hyperlipidemia 01/21/2012    Orientation RESPIRATION BLADDER Height & Weight     Self  Normal Continent Weight: 124 lb 1.9 oz (56.3 kg) Height:  4\' 11"  (149.9 cm)  BEHAVIORAL SYMPTOMS/MOOD NEUROLOGICAL BOWEL NUTRITION STATUS   (none)  (None) Continent Diet (Diet Heart)  AMBULATORY STATUS COMMUNICATION OF NEEDS Skin   Limited Assist Verbally Normal                       Personal Care Assistance Level of Assistance  Bathing, Feeding, Dressing Bathing Assistance: Limited assistance Feeding assistance: Independent Dressing Assistance: Limited assistance     Functional Limitations Info  Sight, Hearing, Speech Sight Info: Adequate Hearing Info: Adequate Speech Info: Adequate    SPECIAL CARE FACTORS FREQUENCY  PT (By licensed PT), OT (By licensed OT)     PT Frequency: 5/ week OT Frequency: 5/ week            Contractures Contractures Info: Not present    Additional Factors Info  Code Status, Allergies, Psychotropic Code Status Info: Full Allergies Info: Tomato Psychotropic Info: FLUoxetine (PROZAC) capsule 20 mg Dose: 20 mg Freq: Daily Route: PO         Current Medications (03/25/2016):  This is the current hospital  active medication list Current Facility-Administered Medications  Medication Dose Route Frequency Provider Last Rate Last Dose  . 0.9 %  sodium chloride infusion   Intravenous Continuous Etta Quill, DO 75 mL/hr at 03/24/16 1934    . acetaminophen (TYLENOL) tablet 650 mg  650 mg Oral Q4H PRN Etta Quill, DO      . albuterol  (PROVENTIL) (2.5 MG/3ML) 0.083% nebulizer solution 3 mL  3 mL Inhalation Q6H PRN Etta Quill, DO      . ALPRAZolam Duanne Moron) tablet 0.25-0.5 mg  0.25-0.5 mg Oral BID PRN Etta Quill, DO   0.5 mg at 03/24/16 1955  . aspirin EC tablet 81 mg  81 mg Oral Daily Etta Quill, DO   81 mg at 03/25/16 1125  . atorvastatin (LIPITOR) tablet 80 mg  80 mg Oral q1800 Etta Quill, DO   80 mg at 03/24/16 1802  . enoxaparin (LOVENOX) injection 40 mg  40 mg Subcutaneous Q24H Etta Quill, DO   40 mg at 03/25/16 0517  . famotidine (PEPCID) tablet 20 mg  20 mg Oral BID Etta Quill, DO   20 mg at 03/25/16 1126  . feeding supplement (ENSURE ENLIVE) (ENSURE ENLIVE) liquid 237 mL  237 mL Oral BID BM Etta Quill, DO   237 mL at 03/25/16 1057  . fenofibrate tablet 160 mg  160 mg Oral Daily Etta Quill, DO   160 mg at 03/25/16 1125  . ferrous sulfate tablet 325 mg  325 mg Oral BID WC Etta Quill, DO   325 mg at 03/25/16 0857  . FLUoxetine (PROZAC) capsule 20 mg  20 mg Oral Daily Etta Quill, DO   20 mg at 03/25/16 1124  . levothyroxine (SYNTHROID, LEVOTHROID) tablet 25 mcg  25 mcg Oral QAC breakfast Etta Quill, DO   25 mcg at 03/25/16 0857  . lisinopril (PRINIVIL,ZESTRIL) tablet 2.5 mg  2.5 mg Oral Daily Etta Quill, DO   2.5 mg at 03/25/16 1124  . mometasone-formoterol (DULERA) 100-5 MCG/ACT inhaler 2 puff  2 puff Inhalation BID Etta Quill, DO   2 puff at 03/25/16 1004  . nicotine (NICODERM CQ - dosed in mg/24 hours) patch 21 mg  21 mg Transdermal Daily Barton Dubois, MD   21 mg at 03/25/16 1009  . ondansetron (ZOFRAN) injection 4 mg  4 mg Intravenous Q6H PRN Etta Quill, DO   4 mg at 03/25/16 1011  . pantoprazole (PROTONIX) EC tablet 40 mg  40 mg Oral Daily Etta Quill, DO   40 mg at 03/25/16 1126  . predniSONE (DELTASONE) tablet 5 mg  5 mg Oral Daily Etta Quill, DO   5 mg at 03/25/16 1123  . ticagrelor (BRILINTA) tablet 90 mg  90 mg Oral BID Etta Quill, DO    90 mg at 03/25/16 1125     Discharge Medications: Please see discharge summary for a list of discharge medications.  Relevant Imaging Results:  Relevant Lab Results:   Additional Information SSN: [id number]   Samule Dry, LCSW

## 2016-03-26 DIAGNOSIS — R112 Nausea with vomiting, unspecified: Secondary | ICD-10-CM | POA: Diagnosis not present

## 2016-03-26 DIAGNOSIS — N179 Acute kidney failure, unspecified: Secondary | ICD-10-CM

## 2016-03-26 DIAGNOSIS — R1031 Right lower quadrant pain: Secondary | ICD-10-CM | POA: Diagnosis not present

## 2016-03-26 DIAGNOSIS — R7989 Other specified abnormal findings of blood chemistry: Secondary | ICD-10-CM | POA: Diagnosis not present

## 2016-03-26 DIAGNOSIS — E876 Hypokalemia: Secondary | ICD-10-CM

## 2016-03-26 LAB — BASIC METABOLIC PANEL
Anion gap: 5 (ref 5–15)
BUN: 7 mg/dL (ref 6–20)
CHLORIDE: 110 mmol/L (ref 101–111)
CO2: 24 mmol/L (ref 22–32)
CREATININE: 0.84 mg/dL (ref 0.44–1.00)
Calcium: 8.1 mg/dL — ABNORMAL LOW (ref 8.9–10.3)
GFR calc non Af Amer: >60 mL/min (ref >60)
Glucose, Bld: 109 mg/dL — ABNORMAL HIGH (ref 65–99)
POTASSIUM: 4.4 mmol/L (ref 3.5–5.1)
Sodium: 139 mmol/L (ref 135–145)

## 2016-03-26 MED ORDER — GUAIFENESIN-DM 100-10 MG/5ML PO SYRP: 5.0000 mL | ORAL_SOLUTION | Freq: Four times a day (QID) | ORAL | 0 refills | Status: AC | PRN

## 2016-03-26 MED ORDER — PANTOPRAZOLE SODIUM 40 MG PO TBEC: 40.0000 mg | DELAYED_RELEASE_TABLET | Freq: Two times a day (BID) | ORAL | 1 refills | Status: AC

## 2016-03-26 MED ORDER — NICOTINE 21 MG/24HR TD PT24: 21.0000 mg | MEDICATED_PATCH | Freq: Every day | TRANSDERMAL | 0 refills | Status: AC

## 2016-03-26 MED ORDER — FAMOTIDINE 20 MG PO TABS: 20.0000 mg | ORAL_TABLET | Freq: Every day | ORAL | 1 refills | Status: AC

## 2016-03-26 MED ORDER — FERROUS SULFATE 325 (65 FE) MG PO TABS: 325.0000 mg | ORAL_TABLET | Freq: Two times a day (BID) | ORAL | 1 refills | Status: AC

## 2016-03-26 MED ORDER — LEVOTHYROXINE SODIUM 25 MCG PO TABS
25.0000 ug | ORAL_TABLET | Freq: Every day | ORAL | 1 refills | Status: DC
Start: 2016-03-26 — End: 2016-05-25

## 2016-03-26 NOTE — Progress Notes (Signed)
Order received to discharge Pt.  Telemetry already DC'ed and CCMD notified.  IV removed with catheter intact.  Discharge education given to Pt's son.  Discussed all current Pt medications.  All questions answered.  Pt denies chest pain or sob at this time.  Pt stable to discharge.

## 2016-03-26 NOTE — Discharge Summary (Signed)
Physician Discharge Summary  JONISHA LEPPO [id number] DOB: 1941/02/08 DOA: 03/23/2016  PCP: Ann Held, DO  Admit date: 03/23/2016 Discharge date: 03/26/2016  Time spent: 30 minutes  Recommendations for Outpatient Follow-up:  1. Repeat BMET to follow electrolytes and renal function 2. Please reassess mental status and perform MMSE; patient might be a candidate for Namend and/or Aricept 3. Repeat thyroid function in 1 month   Discharge Diagnoses:  AMS Right lower quadrant abdominal pain Emesis Elevated troponin AKI (acute kidney injury) ([place]) Hypokalemia Anxiety/depression  Physical deconditioning GERD HTN HLD  Discharge Condition: stable and in no acute distress. Patient discharge home with [facility] services and instructions to follow up with PCP in 10 days.  Diet recommendation: heart healthy diet   Filed Weights   03/23/16 2053 03/24/16 0251  Weight: 59 kg (130 lb) 56.3 kg (124 lb 1.9 oz)    History of present illness:  75 y.o.femalewith medical history significant of STEMI in May of this year, PCI stent to RCA. Other coronaries are widely patent and disease free except RPDA which is 100% occluded. Patient presents to the ED tonight with c/o N/V, abdominal pain, chest pain. Symptoms have completely resolved with zofran at this point, she is pain free. Pain was worst in her RLQ.Symptoms were constant, lasted some 4 hours before resolution. Vomit was NBNB. No fever.  Admitted for further evaluation of elevated troponin, abd pain and AMS.  Hospital Course:  1-altered mental status: -per daughter intermittent and ongoing for the last couple of months. -with concerns for dementia  -B12 and TSH essentially WNL -CT scan w/o acute intracranial abnormalities -will minimize sedative medications  -provide supportive care and assistance -follow up with PCP to reassess and determine use of aricept or Namenda if appropriate.  2-elevated troponin  -no frank CP  described; but given recent MI and stenting cardiology was curbside -her troponin were flatly elevated and despite her stented artery (RA) and a completely occluded RPDA, the rest of her cath was clean -cardiology recommended continue medical management and no further work up  3-AKI: -secondary to dehydration and hypotensive episode -essentially resolved with IVF's and holding antihypertensive agents for one night on admission  -resume home meds again at discharge -recommend BMET to follow renal function trend   4-nausea/vomiting apparently has been chronically intermittent -per daughter patient with IBS -will continue symptomatic management  -no acute abnormalities seen on CT abd/pelvis  5-anxiety and depression -will continue PRN low dose xanax -continue lexapro -patient with characteristic findings and changes in mentation that suggest dementia  6-HTN -BP soft to low on presentation -stable and well controlled at discharge  -ok to continue very low dose of lisinopril -advise to follow heart healthy diet   7-hypothyroidism  -continue synthroid -TSH slightly low -will need repeat thyroid profile in 1 month   8-GERD -will continue PPI  9-HLD -continue statins   10-hx of COPD -no wheezing on exam -will continue Dulera, PRN albuterol and chronic low dose prednisone   11-physical deconditioning  -PT recommended HH services and supervision 24/7 -24/7 assistance and care not available initially; but patient able to ambulate 175 ft (reason why insurance decline SNF placement). -patient discharge to home with [facility] services to further assist in her rehabilitation; especially balance and safety with surroundings.    Procedures:  See below for x-ray reports   Consultations:  Cardiology curbside (recommending no further work up at this point; continue medical management)   Physical Therapy   Discharge Exam: Vitals:  03/25/16 2121 03/26/16 0400  BP: (!) 111/53  (!) 113/57  Pulse: 78 78  Resp: 18 18  Temp: 97.7 F (36.5 C) 97.5 F (36.4 C)    General:  Afebrile, oriented to person and intermittently to place; pleasantly confused and with poor insight. Denies CP and SOB.  Cardiovascular: S1 and S2, no rubs or gallops; no JVD  Respiratory: good air movement, no wheezing, no crackles  Abdomen: soft, mild tenderness to deep palpation on RLQ. Positive BS  Musculoskeletal: no edema or cyanosis    Discharge Instructions   Discharge Instructions    Diet - low sodium heart healthy    Complete by:  As directed   Discharge instructions    Complete by:  As directed   Take medications as prescribed  Follow up with PCP in 10 days Maintain adequate hydration Follow a heart healthy diet   Increase activity slowly    Complete by:  As directed     Current Discharge Medication List    START taking these medications   Details  guaiFENesin-dextromethorphan (ROBITUSSIN DM) 100-10 MG/5ML syrup Take 5 mLs by mouth every 6 (six) hours as needed for cough. Qty: 118 mL, Refills: 0    nicotine (NICODERM CQ - DOSED IN MG/24 HOURS) 21 mg/24hr patch Place 1 patch (21 mg total) onto the skin daily. Qty: 28 patch, Refills: 0      CONTINUE these medications which have CHANGED   Details  famotidine (PEPCID) 20 MG tablet Take 1 tablet (20 mg total) by mouth at bedtime. Qty: 60 tablet, Refills: 1    ferrous sulfate 325 (65 FE) MG tablet Take 1 tablet (325 mg total) by mouth 2 (two) times daily with a meal. Qty: 60 tablet, Refills: 1    levothyroxine (SYNTHROID, LEVOTHROID) 25 MCG tablet Take 1 tablet (25 mcg total) by mouth daily before breakfast. Qty: 30 tablet, Refills: 1    pantoprazole (PROTONIX) 40 MG tablet Take 1 tablet (40 mg total) by mouth 2 (two) times daily. Qty: 60 tablet, Refills: 1      CONTINUE these medications which have NOT CHANGED   Details  albuterol (PROAIR HFA) 108 (90 Base) MCG/ACT inhaler Inhale 1-2 puffs into the lungs every  6 (six) hours as needed for wheezing or shortness of breath. Qty: 1 Inhaler, Refills: 3   Associated Diagnoses: COPD exacerbation (HCC)    ALPRAZolam (XANAX) 0.5 MG tablet TAKE 1/2 TO 1 TABLET BY MOUTH 2 TIMES A DAY AS NEEDED FOR ANXIETY. Qty: 60 tablet, Refills: 0    atorvastatin (LIPITOR) 80 MG tablet Take 80 mg by mouth daily at 6 PM.    Associated Diagnoses: Symptomatic bradycardia; Hyperlipidemia; ST elevation myocardial infarction (STEMI), unspecified artery (HCC)    budesonide-formoterol (SYMBICORT) 80-4.5 MCG/ACT inhaler Inhale 2 puffs into the lungs daily as needed. For shortness of breath Qty: 1 Inhaler, Refills: 3   Associated Diagnoses: COPD exacerbation (HCC)    diclofenac sodium (VOLTAREN) 1 % GEL Apply 4 g topically 4 (four) times daily. Qty: 100 g, Refills: 3   Associated Diagnoses: Primary osteoarthritis of right knee    fenofibrate 160 MG tablet TAKE 1 TABLET BY MOUTH ONCE DAILY Qty: 30 tablet, Refills: 5    lisinopril (PRINIVIL,ZESTRIL) 2.5 MG tablet Take 1 tablet (2.5 mg total) by mouth daily. Qty: 30 tablet, Refills: 12    predniSONE (DELTASONE) 5 MG tablet Take 1 tablet (5 mg total) by mouth daily. Qty: 90 tablet, Refills: 3    ticagrelor (BRILINTA) 90 MG  TABS tablet Take 1 tablet (90 mg total) by mouth 2 (two) times daily. Qty: 60 tablet, Refills: 12    aspirin EC 81 MG EC tablet Take 1 tablet (81 mg total) by mouth daily.    diphenhydramine-acetaminophen (TYLENOL PM) 25-500 MG TABS tablet Take 1 tablet by mouth at bedtime as needed. For sleep    feeding supplement, ENSURE COMPLETE, (ENSURE COMPLETE) LIQD Take 237 mLs by mouth 2 (two) times daily between meals. Qty: 237 mL, Refills: 0    FLUoxetine (PROZAC) 20 MG capsule TAKE 1 CAPSULE BY MOUTH ONCE DAILY Qty: 30 capsule, Refills: 5    hyoscyamine (LEVSIN SL) 0.125 MG SL tablet PLACE 1 TABLET UNDER THE TONGUE EVERY 4 HOURS AS NEEDED. Qty: 30 tablet, Refills: 0      STOP taking these medications      NICOTROL 10 MG inhaler        Allergies  Allergen Reactions  . Tomato Rash   Follow-up Information    [facility] .   Why:  home health physical therapy, nurse, and aide. Contact information: [place] [address] [phone]        Yvonne R Lowne Chase, DO. Schedule an appointment as soon as possible for a visit in 10 day(s).   Specialty:  Family Medicine Contact information: [address] [place] [address] [phone]           The results of significant diagnostics from this hospitalization (including imaging, microbiology, ancillary and laboratory) are listed below for reference.    Significant Diagnostic Studies: Ct Head Wo Contrast  Result Date: 03/24/2016 CLINICAL DATA:  Altered mental status EXAM: CT HEAD WITHOUT CONTRAST TECHNIQUE: Contiguous axial images were obtained from the base of the skull through the vertex without intravenous contrast. COMPARISON:  08/04/2014; 04/24/2012; brain MRI - 08/05/2014. FINDINGS: Brain: Similar findings of advanced atrophy with sulcal prominence centralized volume loss with commensurate ex vacuo dilatation of the ventricular system. Scattered periventricular hypodensities compatible microvascular ischemic disease. Lacunar infarct versus prominent VR space within the caudal aspect the left basal ganglia image 10 series 2). Right-sided basal ganglial calcifications. No CT evidence of acute large territory infarct. No intraparenchymal or extra-axial mass or hemorrhage. Unchanged size a configuration of the ventricles and basilar cisterns. No midline shift Vascular: Intracranial atherosclerosis. Skull: No displaced calvarial fracture. Sinuses/Orbits: There is underpneumatization of the right frontal sinus. The remaining paranasal sinuses and mastoid air cells are normally aerated. No air-fluid levels. Post bilateral cataract surgery. Other: Regional soft tissues appear normal.  IMPRESSION: Similar findings of advanced atrophy and mild microvascular ischemic disease without acute intracranial process. Electronically Signed   By: Sandi Mariscal M.D.   On: 03/24/2016 00:50  Ct Abdomen Pelvis W Contrast  Result Date: 03/24/2016 CLINICAL DATA:  75 year old female with abdominal pain. History of ulcer, and IBS. EXAM: CT ABDOMEN AND PELVIS WITH CONTRAST TECHNIQUE: Multidetector CT imaging of the abdomen and pelvis was performed using the standard protocol following bolus administration of intravenous contrast. CONTRAST:  1 ISOVUE-300 IOPAMIDOL (ISOVUE-300) INJECTION 61% COMPARISON:  Abdominal CT dated 10/11/2015 FINDINGS: Minimal bibasilar dependent atelectatic changes of the lungs. The visualized lung bases are otherwise clear. There is coronary vascular calcification. No intra-abdominal free air or free fluid. Mild apparent fatty infiltration of the liver. Focal high attenuating area at the gallbladder fundus may represent stone or focal adenomyomatosis. Ultrasound may provide better evaluation of the gallbladder. No pericholecystic fluid. The pancreas, spleen, adrenal glands,  left kidney appear unremarkable. Subcentimeter right renal hypodense lesions are too small to characterize but appears stable compared to prior study and likely represent cysts. Ultrasound may provide better characterization. No hydronephrosis. The visualized ureters and urinary bladder appear unremarkable. Hysterectomy. There is postsurgical changes of partial colon resection with anastomotic suture line in the sigmoid colon. There is no evidence of bowel obstruction or active inflammation. R The appendix is not visualized with certainty. No inflammatory changes identified in the right lower quadrant. 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 The origins of the celiac axis, SMA, IMA as well as the origins of the renal arteries appear patent. The SMV, splenic vein, and main portal veins are patent. No portal  venous gas identified. There is no adenopathy. There is a midline vertical anterior pelvic wall incisional scar. The abdominal wall soft tissues are otherwise unremarkable. There is osteopenia with degenerative changes of the spine. No acute fracture. IMPRESSION: Focal adenomyomatosis of the gallbladder fundus versus gallstone. Ultrasound may provide better evaluation of the gallbladder. No other acute intra-abdominal or pelvic pathology identified. Electronically Signed   By: Anner Crete M.D.   On: 03/24/2016 01:01  Dg Chest Portable 1 View  Result Date: 03/23/2016 CLINICAL DATA:  Vomiting and chest pain for 1 day. EXAM: PORTABLE CHEST 1 VIEW COMPARISON:  01/05/2016; 06/23/2015; 08/04/2014 FINDINGS: Grossly unchanged cardiac silhouette and mediastinal contours with slight differences likely total to decreased lung volumes. Worsening bilateral infrahilar heterogeneous opacities, favored to represent atelectasis. No discrete focal airspace opacities. No pleural effusion pneumothorax. No evidence of edema. No acute osseus abnormalities. IMPRESSION: Perihilar atelectasis without definitive acute cardiopulmonary disease on this hypoventilated AP portable examination. Further evaluation with a PA and lateral chest radiograph may be obtained as clinically indicated. Electronically Signed   By: Sandi Mariscal M.D.   On: 03/23/2016 21:45   Microbiology: No results found for this or any previous visit (from the past 240 hour(s)).   Labs: Basic Metabolic Panel:  Recent Labs Lab 03/23/16 2119 03/25/16 0322 03/26/16 0312  NA 136 137 139  K 3.5 3.4* 4.4  CL 107 108 110  CO2 21* 22 24  GLUCOSE 88 95 109*  BUN 18 6 7   CREATININE 1.15* 0.84 0.84  CALCIUM 8.6* 7.7* 8.1*   Liver Function Tests:  Recent Labs Lab 03/23/16 2119  AST 41  ALT 22  ALKPHOS 66  BILITOT 0.4  PROT 5.9*  ALBUMIN 3.0*    Recent Labs Lab 03/23/16 2206  LIPASE 21   CBC:  Recent Labs Lab 03/23/16 2119  03/25/16 0322  WBC 9.9 9.0  HGB 14.1 11.1*  HCT 42.9 34.1*  MCV 93.7 93.2  PLT 313 288   Cardiac Enzymes:  Recent Labs Lab 03/23/16 2139 03/24/16 0156 03/24/16 0330 03/24/16 0650  TROPONINI 0.10* 0.11* 0.12* 0.14*   BNP: BNP (last 3 results)  Recent Labs  03/23/16 1941  BNP 41.5   CBG:  Recent Labs Lab 03/23/16 2113  GLUCAP 86    Signed:  Barton Dubois MD.  [facility] 03/26/2016, 1:30 PM

## 2016-03-26 NOTE — Plan of Care (Signed)
Problem: Safety: Goal: Ability to remain free from injury will improve Outcome: Progressing Pt with specialty low bed.  Cont to closely monitor Pt.

## 2016-03-26 NOTE — Progress Notes (Signed)
CM met with pt and son, Monica Copeland [phone] in room to offer choice of home health agency.  Family choose [facility] to render HHPT/RN/aide.  Referral called to [facility] rep, Tiffany with [facility] as contact.  Pt has rolling walker at home and denies need for additional DME.  No other CM needs were communicated.

## 2016-03-26 NOTE — Progress Notes (Signed)
LCSW following for discharge to SNF. Bed at [facility] in [facility], however unable to obtain DC summary before 10:30am and pharmacy at facility is closed per [facility].  LCSW communicated this with RN as well as CM with regards to barriers.  LCSW spoke with patient family regarding other choices and offers as documented second choice is [facility].  Call placed to family member: Daughter Caren Griffins to discuss other options, give bed offers and follow up as [facility] will not be able to take patient.  Family in room and agreeable to discuss.  Family was agreeable to [facility]. Call placed to [facility] and due to patient walking 138ft patient will not qualify for SNF. Family made aware, voice disappointment but understand. Agreeable to home health and will DC to home today via private vehicle.  MD and RN made aware.  Home to be arranged with [place].     LCSW signing off. Must DC today per Dr. Reynaldo Minium.  Lane Hacker, MSW Clinical Social Work: [employer] [phone]

## 2016-03-26 NOTE — Progress Notes (Signed)
Physical Therapy Treatment Patient Details Name: Monica Copeland MRN: [id number] DOB: September 09, 1940 Today's Date: 03/26/2016    History of Present Illness 75 y.o. female admitted to [facility] on 03/23/16 for AMS, abdominal pain, and chest pain.  Cardiac workup in progress.  Serial triponins ordered.  Pt with recent, significant PMHx of STEMI (2 months PTA).  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    PT Comments    Patient is making improvement with mobility and gait.  However continues to have decreased balance and is a fall risk.  Patient does not have 24 hour assist at home.  Recommend SNF at d/c for continued therapy for mobility and balance.   Follow Up Recommendations  SNF;Supervision/Assistance - 24 hour (Patient does not have 24 hour assist at home)     Equipment Recommendations  None recommended by PT    Recommendations for Other Services       Precautions / Restrictions Precautions Precautions: Fall Precaution Comments: Unsteady and decreased cognition Restrictions Weight Bearing Restrictions: No    Mobility  Bed Mobility Overal bed mobility: Modified Independent                Transfers Overall transfer level: Needs assistance Equipment used: None Transfers: Sit to/from Stand Sit to Stand: Min guard         General transfer comment: Assist for safety.  Patient requires min assist in stance for balance while changing pants and shirt, and performing pericare  Ambulation/Gait Ambulation/Gait assistance: Min assist Ambulation Distance (Feet): 175 Feet Assistive device: None Gait Pattern/deviations: Step-through pattern;Decreased stride length;Ataxic;Staggering left;Staggering right   Gait velocity interpretation: at or above normal speed for age/gender General Gait Details: Patient with quick, short steps.  Encouraged slower, safe pace.  Patient  staggering to both sides with occasional min assist to maintain balance.     Stairs            Wheelchair Mobility    Modified Rankin (Stroke Patients Only)       Balance   Sitting-balance support: Feet supported;No upper extremity supported Sitting balance-Leahy Scale: Good     Standing balance support: No upper extremity supported Standing balance-Leahy Scale: Fair                      Cognition Arousal/Alertness: Awake/alert Behavior During Therapy: Impulsive;Restless Overall Cognitive Status: Impaired/Different from baseline Area of Impairment: Orientation;Memory;Following commands;Safety/judgement;Awareness;Problem solving Orientation Level: Disoriented to;Situation   Memory: Decreased short-term memory Following Commands: Follows multi-step commands inconsistently Safety/Judgement: Decreased awareness of safety;Decreased awareness of deficits   Problem Solving: Slow processing;Difficulty sequencing;Requires verbal cues;Requires tactile cues General Comments: Very impulsive with decreased safety awareness    Exercises      General Comments        Pertinent Vitals/Pain Pain Assessment: No/denies pain    Home Living                      Prior Function            PT Goals (current goals can now be found in the care plan section) Progress towards PT goals: Progressing toward goals    Frequency  Min 3X/week    PT Plan Discharge plan needs to be updated    Co-evaluation             End of Session   Activity Tolerance: Patient tolerated  treatment well Patient left: in bed;with call bell/phone within reach;with bed alarm set;with family/visitor present     Time: 1042-1101 PT Time Calculation (min) (ACUTE ONLY): 19 min  Charges:  $Gait Training: 8-22 mins                    G Codes:  Functional Assessment Tool Used: Clinical judgement Functional Limitation: Mobility: Walking and moving around Mobility: Walking and  Moving Around Goal Status [id number]): At least 1 percent but less than 20 percent impaired, limited or restricted Mobility: Walking and Moving Around Discharge Status [id number]): At least 1 percent but less than 20 percent impaired, limited or restricted   Despina Pole 03/26/2016, 11:10 AM Carita Pian. Sanjuana Kava, [place] Pager [phone]

## 2016-03-26 NOTE — Progress Notes (Signed)
At 20:00 pt was coughing and throwing up. Given PRN zofran and mucinex.   At 21:00 pt was complaining of chest pain that radiated to both arms, abdomen, and back. EKG done with a critical long QTC value. Schorr, NP paged. Received verbal order for 2mg  morphine. Morphine administered and pt went to sleep afterwards. Will continue to monitor closely

## 2016-03-27 ENCOUNTER — Telehealth: Payer: Self-pay | Admitting: Behavioral Health

## 2016-03-27 DIAGNOSIS — E249 Cushing's syndrome, unspecified: Secondary | ICD-10-CM | POA: Diagnosis not present

## 2016-03-27 DIAGNOSIS — I10 Essential (primary) hypertension: Secondary | ICD-10-CM | POA: Diagnosis not present

## 2016-03-27 DIAGNOSIS — I252 Old myocardial infarction: Secondary | ICD-10-CM | POA: Diagnosis not present

## 2016-03-27 DIAGNOSIS — E119 Type 2 diabetes mellitus without complications: Secondary | ICD-10-CM | POA: Diagnosis not present

## 2016-03-27 DIAGNOSIS — J439 Emphysema, unspecified: Secondary | ICD-10-CM | POA: Diagnosis not present

## 2016-03-27 DIAGNOSIS — F419 Anxiety disorder, unspecified: Secondary | ICD-10-CM | POA: Diagnosis not present

## 2016-03-27 DIAGNOSIS — K219 Gastro-esophageal reflux disease without esophagitis: Secondary | ICD-10-CM | POA: Diagnosis not present

## 2016-03-27 NOTE — Telephone Encounter (Signed)
Transition Care Management Follow-up Telephone Call  PCP: Ann Held, DO  Admit date: 03/23/2016 Discharge date: 03/26/2016  Recommendations for Outpatient Follow-up:  1. Repeat BMET to follow electrolytes and renal function 2. Please reassess mental status and perform MMSE; patient might be a candidate for Namend and/or Aricept 3. Repeat thyroid function in 1 month   Discharge Diagnoses:  AMS Right lower quadrant abdominal pain Emesis Elevated troponin AKI (acute kidney injury) ([place]) Hypokalemia Anxiety/depression  Physical deconditioning GERD HTN HLD  Discharge Condition: stable and in no acute distress. Patient discharge home with [facility] services and instructions to follow up with PCP in 10 days.   How have you been since you were released from the hospital? Patient stated, "I'm doing good".   Do you understand why you were in the hospital? yes   Do you understand the discharge instructions? yes   Where were you discharged to? Home   Items Reviewed:  Medications reviewed: yes  Allergies reviewed: yes  Dietary changes reviewed: yes, heart healthy diet Referrals reviewed: yes, patient discharge home with [facility] services and instructions to follow up with PCP in 10 days.   Functional Questionnaire:   Activities of Daily Living (ADLs):   She states they are independent in the following: ambulation, bathing and hygiene, feeding, continence, grooming, toileting and dressing States they require assistance with the following: None   Any transportation issues/concerns?: no   Any patient concerns? no   Confirmed importance and date/time of follow-up visits scheduled yes, 04/03/16 at 11:30 AM.  Provider Appointment booked with Dr. Carollee Herter.  Confirmed with patient if condition begins to worsen call PCP or go to the ER.  Patient was given the office number and encouraged to call back with question or concerns.  : yes

## 2016-03-28 DIAGNOSIS — E119 Type 2 diabetes mellitus without complications: Secondary | ICD-10-CM | POA: Diagnosis not present

## 2016-03-28 DIAGNOSIS — J439 Emphysema, unspecified: Secondary | ICD-10-CM | POA: Diagnosis not present

## 2016-03-28 DIAGNOSIS — K219 Gastro-esophageal reflux disease without esophagitis: Secondary | ICD-10-CM | POA: Diagnosis not present

## 2016-03-28 DIAGNOSIS — I252 Old myocardial infarction: Secondary | ICD-10-CM | POA: Diagnosis not present

## 2016-03-28 DIAGNOSIS — F419 Anxiety disorder, unspecified: Secondary | ICD-10-CM | POA: Diagnosis not present

## 2016-03-28 DIAGNOSIS — E249 Cushing's syndrome, unspecified: Secondary | ICD-10-CM | POA: Diagnosis not present

## 2016-03-28 DIAGNOSIS — I10 Essential (primary) hypertension: Secondary | ICD-10-CM | POA: Diagnosis not present

## 2016-03-29 DIAGNOSIS — J439 Emphysema, unspecified: Secondary | ICD-10-CM | POA: Diagnosis not present

## 2016-03-29 DIAGNOSIS — I252 Old myocardial infarction: Secondary | ICD-10-CM | POA: Diagnosis not present

## 2016-03-29 DIAGNOSIS — I10 Essential (primary) hypertension: Secondary | ICD-10-CM | POA: Diagnosis not present

## 2016-03-29 DIAGNOSIS — F419 Anxiety disorder, unspecified: Secondary | ICD-10-CM | POA: Diagnosis not present

## 2016-03-29 DIAGNOSIS — E119 Type 2 diabetes mellitus without complications: Secondary | ICD-10-CM | POA: Diagnosis not present

## 2016-03-29 DIAGNOSIS — K219 Gastro-esophageal reflux disease without esophagitis: Secondary | ICD-10-CM | POA: Diagnosis not present

## 2016-03-29 DIAGNOSIS — E249 Cushing's syndrome, unspecified: Secondary | ICD-10-CM | POA: Diagnosis not present

## 2016-03-31 ENCOUNTER — Telehealth: Payer: Self-pay | Admitting: *Deleted

## 2016-03-31 NOTE — Telephone Encounter (Signed)
Received Orders paperwork via fax from [place].  Placed in folder for Dr. Carollee Herter to review and sign.//AB/CMA

## 2016-04-03 ENCOUNTER — Inpatient Hospital Stay: Payer: Self-pay | Admitting: Family Medicine

## 2016-04-03 ENCOUNTER — Telehealth: Payer: Self-pay | Admitting: *Deleted

## 2016-04-03 DIAGNOSIS — Z0289 Encounter for other administrative examinations: Secondary | ICD-10-CM
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
## 2016-04-04 DIAGNOSIS — K219 Gastro-esophageal reflux disease without esophagitis: Secondary | ICD-10-CM | POA: Diagnosis not present

## 2016-04-04 DIAGNOSIS — F419 Anxiety disorder, unspecified: Secondary | ICD-10-CM | POA: Diagnosis not present

## 2016-04-04 DIAGNOSIS — I252 Old myocardial infarction: Secondary | ICD-10-CM | POA: Diagnosis not present

## 2016-04-04 DIAGNOSIS — J439 Emphysema, unspecified: Secondary | ICD-10-CM | POA: Diagnosis not present

## 2016-04-04 DIAGNOSIS — E119 Type 2 diabetes mellitus without complications: Secondary | ICD-10-CM | POA: Diagnosis not present

## 2016-04-04 DIAGNOSIS — E249 Cushing's syndrome, unspecified: Secondary | ICD-10-CM | POA: Diagnosis not present

## 2016-04-04 DIAGNOSIS — I10 Essential (primary) hypertension: Secondary | ICD-10-CM | POA: Diagnosis not present

## 2016-04-06 DIAGNOSIS — I10 Essential (primary) hypertension: Secondary | ICD-10-CM | POA: Diagnosis not present

## 2016-04-06 DIAGNOSIS — E119 Type 2 diabetes mellitus without complications: Secondary | ICD-10-CM | POA: Diagnosis not present

## 2016-04-06 DIAGNOSIS — I252 Old myocardial infarction: Secondary | ICD-10-CM | POA: Diagnosis not present

## 2016-04-06 DIAGNOSIS — F419 Anxiety disorder, unspecified: Secondary | ICD-10-CM | POA: Diagnosis not present

## 2016-04-06 DIAGNOSIS — E249 Cushing's syndrome, unspecified: Secondary | ICD-10-CM | POA: Diagnosis not present

## 2016-04-06 DIAGNOSIS — K219 Gastro-esophageal reflux disease without esophagitis: Secondary | ICD-10-CM | POA: Diagnosis not present

## 2016-04-06 DIAGNOSIS — J439 Emphysema, unspecified: Secondary | ICD-10-CM | POA: Diagnosis not present

## 2016-04-07 ENCOUNTER — Telehealth: Payer: Self-pay | Admitting: Endocrinology

## 2016-04-10 ENCOUNTER — Encounter: Payer: Self-pay | Admitting: Endocrinology

## 2016-04-10 ENCOUNTER — Ambulatory Visit (INDEPENDENT_AMBULATORY_CARE_PROVIDER_SITE_OTHER): Payer: Medicare Other | Admitting: Endocrinology

## 2016-04-10 VITALS — BP 104/62 | HR 71 | Temp 97.6°F | Ht 63.0 in | Wt 120.4 lb

## 2016-04-10 DIAGNOSIS — E230 Hypopituitarism: Secondary | ICD-10-CM | POA: Diagnosis not present

## 2016-04-10 LAB — T4, FREE: Free T4: 1.27 ng/dL (ref 0.60–1.60)

## 2016-04-10 LAB — TSH: TSH: 0.33 u[IU]/mL — ABNORMAL LOW (ref 0.35–4.50)

## 2016-04-10 NOTE — Progress Notes (Signed)
Pre visit review using our clinic tool,if applicable. No additional management support is needed unless otherwise documented below in the visit note.  

## 2016-04-10 NOTE — Progress Notes (Signed)
Subjective:    Patient ID: Monica Copeland, female    DOB: [birth year], 75 y.o.   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  Last MRI of the brain was in 2015, and made no mention of the pituitary).  denies headache.   Past Medical History:  Diagnosis Date  . Anxiety   . Asthma   . Asthma   . Back pain   . Cushing disease ([place])   . Diabetes mellitus without complication ([place])   . Emphysema of lung ([place])   . Gastric AVM   . GERD (gastroesophageal reflux disease) 01/24/2012  . Hemorrhoid   . Hyperlipemia   . Hyperlipidemia   . Hypertension   . IBS (irritable bowel syndrome)   . Iron deficiency anemia   . Seizures ([place])   . Ulcer     Past Surgical History:  Procedure Laterality Date  . [place]  . [place]ENDECTOMY    . 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 candida esophagitis    . CARDIAC CATHETERIZATION N/A 01/05/2016   Procedure: Left Heart Cath and Coronary Angiography;  Surgeon: Troy Sine, MD;  Location: [facility];  Service: Cardiovascular;  Laterality: N/A;  . CARDIAC CATHETERIZATION N/A 01/05/2016   Procedure: Coronary Stent Intervention;  Surgeon: Troy Sine, MD;  Location: [place] [facility];  Service: Cardiovascular;  Laterality: N/A;  . CARDIAC CATHETERIZATION N/A 01/05/2016   Procedure: Temporary Pacemaker;  Surgeon: Troy Sine, MD;  Location: [place] [facility];  Service: Cardiovascular;  Laterality: N/A;  . COLON SURGERY     colon perforation  . pylorus ulcer      Social History   Social History  . Marital status: Widowed   Spouse name: N/A  . Number of children: N/A  . Years of education: N/A   Occupational History  . Not on file.   Social History Main Topics  . Smoking status: Current Some Day Smoker    Packs/day: 0.00    Types: Cigarettes  . Smokeless tobacco: Never Used     Comment: Started smoking at age 19.form given 04-02-13  . Alcohol use No  . Drug use: No  . Sexual activity: Not on file   Other Topics Concern  . Not on file   Social History Narrative   ** Merged History Encounter **        Current Outpatient Prescriptions on File Prior to Visit  Medication Sig Dispense Refill  . albuterol (PROAIR HFA) 108 (90 Base) MCG/ACT inhaler Inhale 1-2 puffs into the lungs every 6 (six) hours as needed for wheezing or shortness of breath. 1 Inhaler 3  . ALPRAZolam (XANAX) 0.5 MG tablet TAKE 1/2 TO 1 TABLET BY MOUTH 2 TIMES A DAY AS NEEDED FOR ANXIETY. 60 tablet 0  . aspirin EC 81 MG EC tablet Take 1 tablet (81 mg total) by mouth daily.    Marland Kitchen atorvastatin (LIPITOR) 80 MG tablet Take 80 mg by mouth daily at 6 PM.     . budesonide-formoterol (SYMBICORT) 80-4.5 MCG/ACT inhaler Inhale  2 puffs into the lungs daily as needed. For shortness of breath 1 Inhaler 3  . diclofenac sodium (VOLTAREN) 1 % GEL Apply 4 g topically 4 (four) times daily. 100 g 3  . diphenhydramine-acetaminophen (TYLENOL PM) 25-500 MG TABS tablet Take 1 tablet by mouth at bedtime as needed. For sleep    . famotidine (PEPCID) 20 MG tablet Take 1 tablet (20 mg total) by mouth at bedtime. 60 tablet 1  . feeding supplement, ENSURE COMPLETE, (ENSURE COMPLETE) LIQD Take 237 mLs by mouth 2 (two) times daily between meals. 237 mL 0  . fenofibrate 160 MG tablet TAKE 1 TABLET BY MOUTH ONCE DAILY 30 tablet 5  . ferrous sulfate 325 (65 FE) MG tablet Take 1 tablet (325 mg total) by mouth 2 (two) times daily with a meal. 60 tablet 1  . FLUoxetine (PROZAC) 20 MG capsule TAKE 1 CAPSULE BY MOUTH ONCE DAILY 30 capsule 5  . hyoscyamine (LEVSIN SL) 0.125 MG  SL tablet PLACE 1 TABLET UNDER THE TONGUE EVERY 4 HOURS AS NEEDED. 30 tablet 0  . levothyroxine (SYNTHROID, LEVOTHROID) 25 MCG tablet Take 1 tablet (25 mcg total) by mouth daily before breakfast. 30 tablet 1  . lisinopril (PRINIVIL,ZESTRIL) 2.5 MG tablet Take 1 tablet (2.5 mg total) by mouth daily. 30 tablet 12  . pantoprazole (PROTONIX) 40 MG tablet Take 1 tablet (40 mg total) by mouth 2 (two) times daily. 60 tablet 1  . predniSONE (DELTASONE) 5 MG tablet Take 1 tablet (5 mg total) by mouth daily. 90 tablet 3  . ticagrelor (BRILINTA) 90 MG TABS tablet Take 1 tablet (90 mg total) by mouth 2 (two) times daily. 60 tablet 12  . guaiFENesin-dextromethorphan (ROBITUSSIN DM) 100-10 MG/5ML syrup Take 5 mLs by mouth every 6 (six) hours as needed for cough. (Patient not taking: Reported on 03/27/2016) 118 mL 0  . nicotine (NICODERM CQ - DOSED IN MG/24 HOURS) 21 mg/24hr patch Place 1 patch (21 mg total) onto the skin daily. (Patient not taking: Reported on 03/27/2016) 28 patch 0   No current facility-administered medications on file prior to visit.     Allergies  Allergen Reactions  . Tomato Rash    Family History  Problem Relation Age of Onset  . Hypertension Mother   . Heart attack Mother   . Diabetes Father   . Asthma Father   . Lung cancer Father   . Hypertension Brother   . Asthma Son   . Esophageal cancer Brother   . Liver cancer Brother     mets from esophagus  . Colon cancer Neg Hx     BP 104/62   Pulse 71   Temp 97.6 F (36.4 C) (Oral)   Ht 5\' 3"  (1.6 m)   Wt 120 lb 6.4 oz (54.6 kg)   SpO2 96%   BMI 21.33 kg/m    Review of Systems Denies polyuria    Objective:   Physical Exam VITAL SIGNS:  See vs page GENERAL: no distress NECK: There is no palpable thyroid enlargement.  No thyroid nodule is palpable.  No palpable lymphadenopathy at the anterior neck. Gait: normal and steady.    Lab Results  Component Value Date   CREATININE 0.84 03/26/2016   BUN 7 03/26/2016   NA  139 03/26/2016   K 4.4 03/26/2016   CL 110 03/26/2016   CO2 24 03/26/2016   Lab Results  Component Value Date   TSH 0.33 (L) 04/10/2016   T4TOTAL 4.6 12/07/2014  Assessment & Plan:  Postsurgical hypopituitarism: well-replaced.  Please continue the same medications Pituitary adenoma: we discussed.  Pt declines repeat MRI, and I agree.

## 2016-04-10 NOTE — Patient Instructions (Signed)
blood tests are requested for you today.  We'll let you know about the results. Please continue the same medications. Please return in 1 year.  

## 2016-04-11 ENCOUNTER — Encounter: Payer: Self-pay | Admitting: Family Medicine

## 2016-04-12 ENCOUNTER — Telehealth: Payer: Self-pay | Admitting: Family Medicine

## 2016-04-12 ENCOUNTER — Telehealth: Payer: Self-pay | Admitting: *Deleted

## 2016-04-12 DIAGNOSIS — K219 Gastro-esophageal reflux disease without esophagitis: Secondary | ICD-10-CM | POA: Diagnosis not present

## 2016-04-12 DIAGNOSIS — I10 Essential (primary) hypertension: Secondary | ICD-10-CM | POA: Diagnosis not present

## 2016-04-12 DIAGNOSIS — I252 Old myocardial infarction: Secondary | ICD-10-CM | POA: Diagnosis not present

## 2016-04-12 DIAGNOSIS — J439 Emphysema, unspecified: Secondary | ICD-10-CM | POA: Diagnosis not present

## 2016-04-12 DIAGNOSIS — F419 Anxiety disorder, unspecified: Secondary | ICD-10-CM | POA: Diagnosis not present

## 2016-04-12 DIAGNOSIS — E119 Type 2 diabetes mellitus without complications: Secondary | ICD-10-CM | POA: Diagnosis not present

## 2016-04-12 DIAGNOSIS — E249 Cushing's syndrome, unspecified: Secondary | ICD-10-CM | POA: Diagnosis not present

## 2016-04-12 NOTE — Telephone Encounter (Signed)
Received Home Health Certification and Plan of Care via fax from [facility].  Billing sheet attached and placed in folder for Dr. Carollee Herter to review and sign.//AB/CMA

## 2016-04-12 NOTE — Telephone Encounter (Signed)
[name] name: Jinny Blossom Relationship to patient: [place] Can be reached: [phone]   Reason for call: Nurse for [facility] called to inform provider that patient is being d/c'd from services as of today. States patient has met all of her goals.

## 2016-04-13 NOTE — Telephone Encounter (Signed)
FYI to MD.      KP 

## 2016-04-14 ENCOUNTER — Telehealth: Payer: Self-pay | Admitting: Family Medicine

## 2016-04-14 ENCOUNTER — Ambulatory Visit: Payer: Self-pay | Admitting: Family Medicine

## 2016-04-14 NOTE — Telephone Encounter (Signed)
Patient's son called at 8:30 stating that he could not bring patient to 4:15 appointment today. Charge or No Charge?

## 2016-04-14 NOTE — Telephone Encounter (Signed)
charge 

## 2016-04-21 ENCOUNTER — Ambulatory Visit: Payer: Self-pay | Admitting: Family Medicine

## 2016-04-27 ENCOUNTER — Telehealth: Payer: Self-pay | Admitting: Family Medicine

## 2016-04-27 NOTE — Telephone Encounter (Signed)
Left message on voicemail to return call to schedule annual medicare wellness with nurse

## 2016-05-17 NOTE — Telephone Encounter (Signed)
Received fax. Completed signature and date. Sent for scanning. 

## 2016-05-25 ENCOUNTER — Other Ambulatory Visit: Payer: Self-pay

## 2016-05-25 MED ORDER — LEVOTHYROXINE SODIUM 25 MCG PO TABS: 25.0000 ug | ORAL_TABLET | Freq: Every day | ORAL | 2 refills | Status: AC

## 2016-06-17 ENCOUNTER — Other Ambulatory Visit: Payer: Self-pay | Admitting: Family Medicine

## 2016-07-12 ENCOUNTER — Observation Stay (HOSPITAL_COMMUNITY)
Admission: EM | Admit: 2016-07-12 | Discharge: 2016-07-28 | Disposition: E | Payer: Medicare Other | Attending: Internal Medicine | Admitting: Internal Medicine

## 2016-07-12 ENCOUNTER — Telehealth: Payer: Self-pay | Admitting: Internal Medicine

## 2016-07-12 ENCOUNTER — Emergency Department (HOSPITAL_COMMUNITY): Payer: Medicare Other

## 2016-07-12 ENCOUNTER — Encounter (HOSPITAL_COMMUNITY): Payer: Self-pay | Admitting: Emergency Medicine

## 2016-07-12 DIAGNOSIS — I10 Essential (primary) hypertension: Secondary | ICD-10-CM | POA: Diagnosis not present

## 2016-07-12 DIAGNOSIS — F17210 Nicotine dependence, cigarettes, uncomplicated: Secondary | ICD-10-CM | POA: Insufficient documentation

## 2016-07-12 DIAGNOSIS — J9600 Acute respiratory failure, unspecified whether with hypoxia or hypercapnia: Secondary | ICD-10-CM | POA: Diagnosis not present

## 2016-07-12 DIAGNOSIS — R4781 Slurred speech: Secondary | ICD-10-CM | POA: Insufficient documentation

## 2016-07-12 DIAGNOSIS — E785 Hyperlipidemia, unspecified: Secondary | ICD-10-CM | POA: Insufficient documentation

## 2016-07-12 DIAGNOSIS — E119 Type 2 diabetes mellitus without complications: Secondary | ICD-10-CM | POA: Insufficient documentation

## 2016-07-12 DIAGNOSIS — F419 Anxiety disorder, unspecified: Secondary | ICD-10-CM | POA: Diagnosis not present

## 2016-07-12 DIAGNOSIS — F329 Major depressive disorder, single episode, unspecified: Secondary | ICD-10-CM | POA: Insufficient documentation

## 2016-07-12 DIAGNOSIS — D509 Iron deficiency anemia, unspecified: Secondary | ICD-10-CM | POA: Diagnosis not present

## 2016-07-12 DIAGNOSIS — R402431 Glasgow coma scale score 3-8, in the field [EMT or ambulance]: Secondary | ICD-10-CM | POA: Diagnosis not present

## 2016-07-12 DIAGNOSIS — R404 Transient alteration of awareness: Secondary | ICD-10-CM | POA: Diagnosis not present

## 2016-07-12 DIAGNOSIS — Z66 Do not resuscitate: Secondary | ICD-10-CM | POA: Insufficient documentation

## 2016-07-12 DIAGNOSIS — E039 Hypothyroidism, unspecified: Secondary | ICD-10-CM | POA: Diagnosis not present

## 2016-07-12 DIAGNOSIS — S065XAA Traumatic subdural hemorrhage with loss of consciousness status unknown, initial encounter: Secondary | ICD-10-CM | POA: Diagnosis present

## 2016-07-12 DIAGNOSIS — W19XXXA Unspecified fall, initial encounter: Secondary | ICD-10-CM | POA: Insufficient documentation

## 2016-07-12 DIAGNOSIS — Z7982 Long term (current) use of aspirin: Secondary | ICD-10-CM | POA: Diagnosis not present

## 2016-07-12 DIAGNOSIS — K219 Gastro-esophageal reflux disease without esophagitis: Secondary | ICD-10-CM | POA: Insufficient documentation

## 2016-07-12 DIAGNOSIS — I6200 Nontraumatic subdural hemorrhage, unspecified: Secondary | ICD-10-CM | POA: Diagnosis not present

## 2016-07-12 DIAGNOSIS — R42 Dizziness and giddiness: Secondary | ICD-10-CM | POA: Diagnosis not present

## 2016-07-12 DIAGNOSIS — S299XXA Unspecified injury of thorax, initial encounter: Secondary | ICD-10-CM | POA: Diagnosis not present

## 2016-07-12 DIAGNOSIS — S199XXA Unspecified injury of neck, initial encounter: Secondary | ICD-10-CM | POA: Diagnosis not present

## 2016-07-12 DIAGNOSIS — S065X9A Traumatic subdural hemorrhage with loss of consciousness of unspecified duration, initial encounter: Principal | ICD-10-CM | POA: Insufficient documentation

## 2016-07-12 DIAGNOSIS — R40243 Glasgow coma scale score 3-8, unspecified time: Secondary | ICD-10-CM | POA: Insufficient documentation

## 2016-07-12 DIAGNOSIS — S0990XA Unspecified injury of head, initial encounter: Secondary | ICD-10-CM | POA: Diagnosis not present

## 2016-07-12 DIAGNOSIS — I6201 Nontraumatic acute subdural hemorrhage: Secondary | ICD-10-CM | POA: Diagnosis not present

## 2016-07-12 DIAGNOSIS — S065X0A Traumatic subdural hemorrhage without loss of consciousness, initial encounter: Secondary | ICD-10-CM | POA: Diagnosis not present

## 2016-07-12 LAB — DIFFERENTIAL
BASOS ABS: 0 10*3/uL (ref 0.0–0.1)
Basophils Relative: 0 %
EOS ABS: 0 10*3/uL (ref 0.0–0.7)
Eosinophils Relative: 0 %
LYMPHS ABS: 3 10*3/uL (ref 0.7–4.0)
LYMPHS PCT: 12 %
Monocytes Absolute: 1.8 10*3/uL — ABNORMAL HIGH (ref 0.1–1.0)
Monocytes Relative: 7 %
NEUTROS ABS: 20.6 10*3/uL — AB (ref 1.7–7.7)
NEUTROS PCT: 81 %

## 2016-07-12 LAB — COMPREHENSIVE METABOLIC PANEL
ALBUMIN: 2.9 g/dL — AB (ref 3.5–5.0)
ALT: 80 U/L — ABNORMAL HIGH (ref 14–54)
ANION GAP: 15 (ref 5–15)
AST: 92 U/L — ABNORMAL HIGH (ref 15–41)
Alkaline Phosphatase: 69 U/L (ref 38–126)
BUN: 13 mg/dL (ref 6–20)
CHLORIDE: 99 mmol/L — AB (ref 101–111)
CO2: 18 mmol/L — AB (ref 22–32)
Calcium: 8.5 mg/dL — ABNORMAL LOW (ref 8.9–10.3)
Creatinine, Ser: 0.97 mg/dL (ref 0.44–1.00)
GFR calc non Af Amer: 56 mL/min — ABNORMAL LOW (ref >60)
Glucose, Bld: 154 mg/dL — ABNORMAL HIGH (ref 65–99)
POTASSIUM: 3.1 mmol/L — AB (ref 3.5–5.1)
SODIUM: 132 mmol/L — AB (ref 135–145)
Total Bilirubin: 1.4 mg/dL — ABNORMAL HIGH (ref 0.3–1.2)
Total Protein: 6.6 g/dL (ref 6.5–8.1)

## 2016-07-12 LAB — I-STAT CHEM 8, ED
BUN: 14 mg/dL (ref 6–20)
CREATININE: 0.8 mg/dL (ref 0.44–1.00)
Calcium, Ion: 0.94 mmol/L — ABNORMAL LOW (ref 1.15–1.40)
Chloride: 99 mmol/L — ABNORMAL LOW (ref 101–111)
Glucose, Bld: 154 mg/dL — ABNORMAL HIGH (ref 65–99)
HEMATOCRIT: 37 % (ref 36.0–46.0)
Hemoglobin: 12.6 g/dL (ref 12.0–15.0)
POTASSIUM: 3.2 mmol/L — AB (ref 3.5–5.1)
SODIUM: 133 mmol/L — AB (ref 135–145)
TCO2: 22 mmol/L (ref 0–100)

## 2016-07-12 LAB — CBC
HCT: 33.8 % — ABNORMAL LOW (ref 36.0–46.0)
Hemoglobin: 11.2 g/dL — ABNORMAL LOW (ref 12.0–15.0)
MCH: 29.7 pg (ref 26.0–34.0)
MCHC: 33.1 g/dL (ref 30.0–36.0)
MCV: 89.7 fL (ref 78.0–100.0)
PLATELETS: 406 10*3/uL — AB (ref 150–400)
RBC: 3.77 MIL/uL — AB (ref 3.87–5.11)
RDW: 15.2 % (ref 11.5–15.5)
WBC: 25.4 10*3/uL — ABNORMAL HIGH (ref 4.0–10.5)

## 2016-07-12 LAB — I-STAT TROPONIN, ED: Troponin i, poc: 0.04 ng/mL (ref 0.00–0.08)

## 2016-07-12 LAB — PROTIME-INR
INR: 0.96
PROTHROMBIN TIME: 12.8 s (ref 11.4–15.2)

## 2016-07-12 LAB — APTT: APTT: 31 s (ref 24–36)

## 2016-07-12 LAB — ETHANOL: Alcohol, Ethyl (B): <5 mg/dL (ref <5)

## 2016-07-12 MED ORDER — FENTANYL CITRATE (PF) 100 MCG/2ML IJ SOLN: 50.0000 ug | INTRAMUSCULAR | Status: DC | PRN

## 2016-07-12 MED ORDER — LORAZEPAM 2 MG/ML IJ SOLN
1.0000 mg/h | INTRAVENOUS | Status: DC
  Administered 2016-07-12: 1 mg/h via INTRAVENOUS
  Filled 2016-07-12: qty 25

## 2016-07-12 MED ORDER — SODIUM CHLORIDE 0.9 % IV SOLN
10.0000 mg/h | INTRAVENOUS | Status: DC
  Administered 2016-07-12: 5 mg/h via INTRAVENOUS

## 2016-07-12 MED ORDER — FENTANYL CITRATE (PF) 100 MCG/2ML IJ SOLN
50.0000 ug | INTRAMUSCULAR | Status: DC | PRN
  Administered 2016-07-12: 50 ug via INTRAVENOUS

## 2016-07-12 MED ORDER — PROPOFOL 1000 MG/100ML IV EMUL
INTRAVENOUS | Status: DC
Start: 2016-07-12 — End: 2016-07-12
  Filled 2016-07-12: qty 100

## 2016-07-12 MED ORDER — SODIUM CHLORIDE 0.9 % IV BOLUS (SEPSIS)
500.0000 mL | Freq: Once | INTRAVENOUS | Status: AC
  Administered 2016-07-12: 500 mL via INTRAVENOUS

## 2016-07-12 MED ORDER — FENTANYL CITRATE (PF) 100 MCG/2ML IJ SOLN
INTRAMUSCULAR | Status: AC
  Filled 2016-07-12: qty 2

## 2016-07-12 MED ORDER — SUCCINYLCHOLINE CHLORIDE 20 MG/ML IJ SOLN
INTRAMUSCULAR | Status: AC | PRN
  Administered 2016-07-12: 100 mg via INTRAVENOUS

## 2016-07-12 MED ORDER — ETOMIDATE 2 MG/ML IV SOLN
INTRAVENOUS | Status: AC | PRN
  Administered 2016-07-12: 20 mg via INTRAVENOUS

## 2016-07-12 MED ORDER — SODIUM CHLORIDE 0.9 % IV SOLN
5.0000 mg/h | INTRAVENOUS | Status: DC
  Administered 2016-07-12: 5 mg/h via INTRAVENOUS
  Filled 2016-07-12: qty 10

## 2016-07-12 MED ORDER — PROPOFOL 1000 MG/100ML IV EMUL
0.0000 ug/kg/min | INTRAVENOUS | Status: DC
  Administered 2016-07-12: 5 ug/kg/min via INTRAVENOUS

## 2016-07-17 ENCOUNTER — Telehealth: Payer: Self-pay | Admitting: Family Medicine

## 2016-07-17 NOTE — Telephone Encounter (Signed)
I filled it out last week and gave it to girls in front

## 2016-07-17 NOTE — Telephone Encounter (Signed)
Pat with [facility] is calling regarding patient's death certificate. She would like a call back. Please advise.   Phone: [phone] or cell: [phone]

## 2016-07-17 NOTE — Telephone Encounter (Signed)
Pat with [facility] is calling regarding patient's death certificate. She would like a call back. Please advise.   Phone: [phone] or cell: [phone]

## 2016-07-18 NOTE — Telephone Encounter (Signed)
Per Monica Copeland, death certificate has been picked up [facility].

## 2016-07-28 NOTE — Progress Notes (Signed)
Provided emotional/spiritual support to other arriving family members, and information on patient placement (w/ new no.) to son and his wife. Pt is still pended in 3MW at change of shift. Information provided to night chaplain Legrand Como.   06-Aug-2016 1700  Clinical Encounter Type  Visited With Patient and family together  Visit Type Follow-up  Referral From Chaplain;Nurse  Spiritual Encounters  Spiritual Needs Emotional;Grief support;Other (Comment) (end of life)  Stress Factors  Patient Stress Factors Other (Comment) (end of life)  Family Stress Factors Loss

## 2016-07-28 NOTE — ED Notes (Signed)
[facility] notified , pt. not suitable for donation , referral no. [id number]

## 2016-07-28 NOTE — ED Notes (Addendum)
ED Provider at bedside discussing further care

## 2016-07-28 NOTE — Code Documentation (Signed)
75yo female arriving to [facility] via [facility] at 1416.  Patient from home where she called 911 d/t a fall.  EMS arrived to the home and patient let them in.  Patient found to have bruising that appeared to be old and no focal deficits at that time other than slurred speech.  Patient reported that she fell but was not a good historian as to when the fall occurred.  Son called per patient request and reports patient with slurred speech and confusion at times at baseline.  Patient's son reports patient fell on Saturday and requested that EMS not transport patient until he arrived.  LKW 0800 per son.  While EMS was waiting the patient became progressively less responsive.  Code stroke activated.  Stroke team at the bedside on patient arrival.  Labs drawn and patient to Trauma C for airway management.  EMS reports patient posturing on exam.  NIHSS 28, see documentation for details and code stroke times.  Patient unresponsive and posturing to noxious stimuli on exam.  Pupils - right 26mm nonreactive, left 13mm nonreactive.  Patient intubated by EDP.  Patient to CT with team.  CT showing large acute subdural hematoma around the left cerebral convexity with subfalcine and uncal herniation.  Midline shift measures 2.6cm.  Right lateral ventricular entrapment.  Patient to be admitted to ICU.  Handoff with ED RN.

## 2016-07-28 NOTE — Consult Note (Signed)
Reason for Consult:SDH Referring Physician: E DP  Monica Copeland is an 75 y.o. female.   HPI:  75 year old female transferred to the emergency department with sudden rapid decline in mental status. She fell on Saturday. It appears she may have fallen again today. She had rapid decline in mental status and row. She was intubated and CT scan showed a 3 cm acute left subdural hematoma with 26 mm of midline shift. She is unable to cooperate with history and physical exam  Past Medical History:  Diagnosis Date  . Anxiety   . Asthma   . Asthma   . Back pain   . Cushing disease ([place])   . Diabetes mellitus without complication ([place])   . Emphysema of lung ([place])   . Gastric AVM   . GERD (gastroesophageal reflux disease) 01/24/2012  . Hemorrhoid   . Hyperlipemia   . Hyperlipidemia   . Hypertension   . IBS (irritable bowel syndrome)   . Iron deficiency anemia   . Seizures ([place])   . Ulcer [facility])     Past Surgical History:  Procedure Laterality Date  . [place]  . APPENDECTOMY    . 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 candida esophagitis    . CARDIAC CATHETERIZATION N/A 01/05/2016   Procedure: Left Heart Cath and Coronary Angiography;  Surgeon: Troy Sine, MD;  Location: [place] [facility];  Service: Cardiovascular;  Laterality: N/A;  . CARDIAC CATHETERIZATION N/A 01/05/2016   Procedure: Coronary Stent Intervention;  Surgeon: Troy Sine, MD;  Location: [place] [facility];  Service: Cardiovascular;  Laterality: N/A;  . CARDIAC CATHETERIZATION N/A 01/05/2016   Procedure: Temporary Pacemaker;  Surgeon: Troy Sine, MD;  Location: [place] [facility];  Service: Cardiovascular;  Laterality: N/A;  . COLON SURGERY     colon perforation  . pylorus ulcer      Allergies  Allergen Reactions  . Tomato Rash    Social History  Substance Use Topics  . Smoking status: Current Some Day Smoker    Packs/day: 0.00    Types: Cigarettes  . Smokeless tobacco:  Never Used     Comment: Started smoking at age 81.form given 04-02-13  . Alcohol use No    Family History  Problem Relation Age of Onset  . Hypertension Mother   . Heart attack Mother   . Diabetes Father   . Asthma Father   . Lung cancer Father   . Hypertension Brother   . Asthma Son   . Esophageal cancer Brother   . Liver cancer Brother     mets from esophagus  . Colon cancer Neg Hx      Review of Systems  Positive ROS: Unable to obtain  All other systems have been reviewed and were otherwise negative with the exception of those mentioned in the HPI and as above.  Objective: Vital signs in last 24 hours: Temp:  [98 F (36.7 C)] 98 F (36.7 C) (11/15 1422) Pulse Rate:  [67] 67 (11/15 1422) Resp:  [20] 20 (11/15 1422) BP: (121)/(87) 121/87 (11/15 1422) SpO2:  [100 %] 100 % (11/15 1422) FiO2 (%):  [100 %] 100 % (11/15 1425) Weight:  [54.4 kg (120 lb)] 54.4 kg (120 lb) (11/15 1457)  General Appearance: Thin elderly white female lying in a gurney, periorbital ecchymoses Head: As above Eyes: Pupils 5 mm and unreactive     Throat: Intubated Neck: Supple  Lungs: Intubated  Heart: RRR Abdomen: Soft   NEUROLOGIC:   Mental status: GCS 4 Motor Exam - extensor posturing Sensory Exam - unable to examine Reflexes: Absent  Coordination - unable to examine Gait - unable to examine Balance - unable to examine Cranial Nerves: I: smell Not tested  II: visual acuity  OS: na    OD: na  II: visual fields   II: pupils 5 mm and fixed   III,VII: ptosis   III,IV,VI: extraocular muscles    V: mastication   V: facial light touch sensation    V,VII: corneal reflex  Absent   VII: facial muscle function - upper    VII: facial muscle function - lower   VIII: hearing   IX: soft palate elevation    IX,X: gag reflex Present   XI: trapezius strength    XI: sternocleidomastoid strength   XI: neck flexion strength    XII: tongue strength      Data Review Lab Results  Component  Value Date   WBC 25.4 (H) Aug 08, 2016   HGB 12.6 August 08, 2016   HCT 37.0 2016-08-08   MCV 89.7 August 08, 2016   PLT 406 (H) 2016/08/08   Lab Results  Component Value Date   NA 133 (L) 08-08-2016   K 3.2 (L) 2016/08/08   CL 99 (L) 2016-08-08   CO2 18 (L) 08-08-2016   BUN 14 08/08/16   CREATININE 0.80 August 08, 2016   GLUCOSE 154 (H) 08-08-16   Lab Results  Component Value Date   INR 0.96 08/08/16    Radiology: Ct Cervical Spine Wo Contrast  Result Date: 08-Aug-2016 CLINICAL DATA:  Posturing.  Fall.  Code stroke. EXAM: CT HEAD WITHOUT CONTRAST CT CERVICAL SPINE WITHOUT CONTRAST TECHNIQUE: Multidetector CT imaging of the head and cervical spine was performed following the standard protocol without intravenous contrast. Multiplanar CT image reconstructions of the cervical spine were also generated. COMPARISON:  03/24/2016 FINDINGS: CT HEAD FINDINGS Brain: Large mixed density subdural hematoma around the right cerebral convexity measuring up to 3 cm in thickness. Marked mass effect with left uncal and subfalcine herniation. Midline shift measures up to 26 mm. The right lateral ventricle is entrapped with temporal horn dilatation. No ACA or other infarct is noted. No contralateral hemorrhage. No visible brain edema. Vascular: Atherosclerotic calcification.  No hyperdense vessel. Skull: Left forehead swelling without underlying fracture Sinuses/Orbits: No acute finding.  Bilateral cataract resection. Other: Critical Value/emergent results were called by telephone at the time of interpretation on 2016-08-08 at 2:52 pm to Dr. Julianne Rice , who verbally acknowledged these results. CT CERVICAL SPINE FINDINGS Alignment: Normal Skull base and vertebrae: Negative for fracture Soft tissues and spinal canal: No prevertebral fluid or swelling. No visible canal hematoma. Visible portions of endotracheal orogastric tubes are in good position. Disc levels: Right paracentral disc protrusion at C3-4 without  suspected cord effect. Upper chest: No acute finding. IMPRESSION: 1. Large acute subdural hematoma around the left cerebral convexity with subfalcine and uncal herniation. Midline shift measures 2.6 cm. Right lateral ventricular entrapment. 2. No evidence of cervical spine injury. Electronically Signed   By: Monte Fantasia M.D.   On: 2016-08-08 14:57   Dg Chest Portable 1 View  Result Date: Aug 08, 2016 CLINICAL DATA:  Unresponsive.  Fall. EXAM: PORTABLE CHEST 1 VIEW COMPARISON:  03/23/2016 FINDINGS: ET tube tip is above the carina. There is a nasogastric tube with tip in the stomach. Heart size is normal. Aortic atherosclerosis. No pleural effusion or edema. No airspace opacities. IMPRESSION: 1. Support apparatus positioned  as above. Electronically Signed   By: Kerby Moors M.D.   On: 07/25/16 14:57   Ct Head Code Stroke W/o Cm  Result Date: 07-25-16 CLINICAL DATA:  Posturing.  Fall.  Code stroke. EXAM: CT HEAD WITHOUT CONTRAST CT CERVICAL SPINE WITHOUT CONTRAST TECHNIQUE: Multidetector CT imaging of the head and cervical spine was performed following the standard protocol without intravenous contrast. Multiplanar CT image reconstructions of the cervical spine were also generated. COMPARISON:  03/24/2016 FINDINGS: CT HEAD FINDINGS Brain: Large mixed density subdural hematoma around the right cerebral convexity measuring up to 3 cm in thickness. Marked mass effect with left uncal and subfalcine herniation. Midline shift measures up to 26 mm. The right lateral ventricle is entrapped with temporal horn dilatation. No ACA or other infarct is noted. No contralateral hemorrhage. No visible brain edema. Vascular: Atherosclerotic calcification.  No hyperdense vessel. Skull: Left forehead swelling without underlying fracture Sinuses/Orbits: No acute finding.  Bilateral cataract resection. Other: Critical Value/emergent results were called by telephone at the time of interpretation on 07/25/2016 at 2:52 pm to  Dr. Julianne Rice , who verbally acknowledged these results. CT CERVICAL SPINE FINDINGS Alignment: Normal Skull base and vertebrae: Negative for fracture Soft tissues and spinal canal: No prevertebral fluid or swelling. No visible canal hematoma. Visible portions of endotracheal orogastric tubes are in good position. Disc levels: Right paracentral disc protrusion at C3-4 without suspected cord effect. Upper chest: No acute finding. IMPRESSION: 1. Large acute subdural hematoma around the left cerebral convexity with subfalcine and uncal herniation. Midline shift measures 2.6 cm. Right lateral ventricular entrapment. 2. No evidence of cervical spine injury. Electronically Signed   By: Monte Fantasia M.D.   On: 25-Jul-2016 14:57     Assessment/Plan: 75 year old female with a large left-sided acute subdural hematoma with significant shift and mass effect to his Glasgow Coma Scale 4 with rapidly declining neurologic exam. I have had a long discussion with the family. It appears she would not want life prolonged "on machines," and I think any treatment here would be futile. I do not think an emergent craniotomy with evacuation of subdural hematoma would necessarily change her functional outcome. I think the prognosis for survival is grim given her age greater than 60 and her premorbid status. I have spoken to the patient's family about DO NOT RESUSCITATE status and a agree with making her DO NOT RESUSCITATE. This should not change her care up to the point of death at this point.   Shawnice Tilmon S 2016/07/25 3:48 PM

## 2016-07-28 NOTE — Discharge Summary (Signed)
Death Summary  [name] [id number] DOB: March 31, 1941 DOA: 07-20-2016  PCP: Ann Held, DO PCP/Office notified:   Admit date: 07-20-16 Date of Death: 2016/07/21  Final Diagnoses:  Acute SDH  History of present illness:  Monica Copeland is a 75 y.o. female with a medical history of asthma, diabetes mellitus, hypertension, who presented to the emergency department after falling today (she also had a fall on Saturday). It appears that patient was noted to have slurred speech. Upon arrival of EMS, patient was noted to be lethargic. She was interacting at that time however slowed reaction. Patient was brought to the emergency department and was noted to have posturing. She was also unresponsive.  Hospital Course:  Acute SDH -s/p fall -CT head: Large acute subdural hematoma around the left cerebral convexity with subfalcine and uncal herniation. Midline shift measures 2.6 cm. Right lateral ventricular entrapment. -Neurology and Neurosurgery consulted and appreciated, no intervention -Patient currently intubated but will be terminally extubated. -Patient's family opted for patient to be DNR. -Patient admitted strictly for comfort care/palliative -Placed on morphine and ativan drips -Patient expired at 2040 on 20-Jul-2016 (after extubation in the ER)  Iron deficiency Anemia Asthma GERD Essential Hypertension Diabetes mellitus  Depression /Anxiety Hypothyroidism    Time: 5 minutes  Signed:  Cristal Ford  [facility] 2016-07-21, 11:54 AM

## 2016-07-28 NOTE — ED Notes (Signed)
Dr. Kathrynn Humble made aware of pts bp. NS given.

## 2016-07-28 NOTE — H&P (Signed)
[facility] History and Physical  Monica Copeland [id number] DOB: 21-Mar-1941 DOA: Aug 01, 2016  PCP: Ann Held, DO  Patient coming from: home  Chief Complaint: Unresponsive  HPI: Monica Copeland is a 75 y.o. female with a medical history of asthma, diabetes mellitus, hypertension, who presented to the emergency department after falling today (she also had a fall on Saturday). It appears that patient was noted to have slurred speech. Upon arrival of EMS, patient was noted to be lethargic. She was interacting at that time however slowed reaction. Patient was brought to the emergency department and was noted to have posturing. She was also unresponsive.  ED Course: CT head noted subdural hematoma. Neurology and neurosurgery consulted. The patient's CODE STATUS changed to DO NOT RESUSCITATE. Patient currently intubated. Will move forward to comfort care  Review of Systems:  All other systems reviewed and are negative.   Past Medical History:  Diagnosis Date  . Anxiety   . Asthma   . Asthma   . Back pain   . 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 Diabetes mellitus without complication ([place])   . Emphysema of lung ([place])   . Gastric AVM   . GERD (gastroesophageal reflux disease) 01/24/2012  . Hemorrhoid   . Hyperlipemia   . Hyperlipidemia   . Hypertension   . IBS (irritable bowel syndrome)   . Iron deficiency anemia   . Seizures ([place])   . Ulcer [facility])     Past Surgical History:  Procedure Laterality Date  . [place]  . APPENDECTOMY    . 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 candida esophagitis    . CARDIAC CATHETERIZATION N/A 01/05/2016   Procedure: Left Heart Cath and Coronary Angiography;  Surgeon: Troy Sine, MD;  Location: [place] [facility];  Service: Cardiovascular;  Laterality: N/A;  . CARDIAC CATHETERIZATION N/A 01/05/2016   Procedure: Coronary Stent Intervention;  Surgeon: Troy Sine, MD;  Location: [place] [facility];   Service: Cardiovascular;  Laterality: N/A;  . CARDIAC CATHETERIZATION N/A 01/05/2016   Procedure: Temporary Pacemaker;  Surgeon: Troy Sine, MD;  Location: [place] [facility];  Service: Cardiovascular;  Laterality: N/A;  . COLON SURGERY     colon perforation  . pylorus ulcer      Social History:  reports that she has been smoking Cigarettes.  She has been smoking about 0.00 packs per day. She has never used smokeless tobacco. She reports that she does not drink alcohol or use drugs.   Allergies  Allergen Reactions  . Tomato Rash    Family History  Problem Relation Age of Onset  . Hypertension Mother   . Heart attack Mother   . Diabetes Father   . Asthma Father   . Lung cancer Father   . Hypertension Brother   . Asthma Son   . Esophageal cancer Brother   . Liver cancer Brother     mets from esophagus  . Colon cancer Neg Hx     Prior to Admission medications   Medication Sig Start Date End Date Taking? Authorizing Provider  albuterol (PROAIR HFA) 108 (90 Base) MCG/ACT inhaler Inhale 1-2 puffs into the lungs every 6 (six) hours as needed for wheezing or shortness of breath. 01/13/16   Yvonne R Lowne Chase, DO  ALPRAZolam (XANAX) 0.5 MG tablet TAKE 1/2 TO 1 TABLET BY MOUTH 2 TIMES A DAY AS NEEDED FOR ANXIETY. 03/09/16   Kendrick Fries  R Lowne Chase, DO  aspirin EC 81 MG EC tablet Take 1 tablet (81 mg total) by mouth daily. 01/08/16   Arbutus Leas, NP  atorvastatin (LIPITOR) 80 MG tablet Take 80 mg by mouth daily at 6 PM.  01/08/16   Historical Provider, MD  budesonide-formoterol (SYMBICORT) 80-4.5 MCG/ACT inhaler Inhale 2 puffs into the lungs daily as needed. For shortness of breath 01/13/16   Rosalita Chessman Chase, DO  diclofenac sodium (VOLTAREN) 1 % GEL Apply 4 g topically 4 (four) times daily. 01/13/16   Alferd Apa Lowne Chase, DO  diphenhydramine-acetaminophen (TYLENOL PM) 25-500 MG TABS tablet Take 1 tablet by mouth at bedtime as needed. For sleep    Historical Provider, MD  famotidine  (PEPCID) 20 MG tablet Take 1 tablet (20 mg total) by mouth at bedtime. 03/26/16   Barton Dubois, MD  feeding supplement, ENSURE COMPLETE, (ENSURE COMPLETE) LIQD Take 237 mLs by mouth 2 (two) times daily between meals. 08/06/14   Robbie Lis, MD  fenofibrate 160 MG tablet TAKE 1 TABLET BY MOUTH ONCE DAILY 01/14/16   Rosalita Chessman Chase, DO  ferrous sulfate 325 (65 FE) MG tablet Take 1 tablet (325 mg total) by mouth 2 (two) times daily with a meal. 03/26/16   Barton Dubois, MD  FLUoxetine (PROZAC) 20 MG capsule TAKE 1 CAPSULE BY MOUTH ONCE DAILY 06/19/16   Alferd Apa Lowne Chase, DO  guaiFENesin-dextromethorphan (ROBITUSSIN DM) 100-10 MG/5ML syrup Take 5 mLs by mouth every 6 (six) hours as needed for cough. Patient not taking: Reported on 03/27/2016 03/26/16   Barton Dubois, MD  hyoscyamine (LEVSIN SL) 0.125 MG SL tablet PLACE 1 TABLET UNDER THE TONGUE EVERY 4 HOURS AS NEEDED. 09/27/15   Rosalita Chessman Chase, DO  levothyroxine (SYNTHROID, LEVOTHROID) 25 MCG tablet Take 1 tablet (25 mcg total) by mouth daily before breakfast. 05/25/16   Rosalita Chessman Chase, DO  lisinopril (PRINIVIL,ZESTRIL) 2.5 MG tablet Take 1 tablet (2.5 mg total) by mouth daily. 01/08/16   Arbutus Leas, NP  nicotine (NICODERM CQ - DOSED IN MG/24 HOURS) 21 mg/24hr patch Place 1 patch (21 mg total) onto the skin daily. Patient not taking: Reported on 03/27/2016 03/26/16   Barton Dubois, MD  pantoprazole (PROTONIX) 40 MG tablet Take 1 tablet (40 mg total) by mouth 2 (two) times daily. 03/26/16   Barton Dubois, MD  predniSONE (DELTASONE) 5 MG tablet Take 1 tablet (5 mg total) by mouth daily. 10/12/15   Renato Shin, MD  ticagrelor (BRILINTA) 90 MG TABS tablet Take 1 tablet (90 mg total) by mouth 2 (two) times daily. 01/08/16   Arbutus Leas, NP    Physical Exam: Vitals:   07/28/16 1705 07-28-2016 1710  BP: (!) 88/35 (!) 95/39  Pulse: (!) 59 63  Resp: 20 21  Temp:       General: Well developed, well nourished, NAD, appears stated  age  32: [place], or ecchymosis, pupils fixed and dilated 5 mm  Neck: Supple, no JVD, no masses  Cardiovascular: S1 S2 auscultated, no rubs, murmurs or gallops. Regular rate and rhythm.  Respiratory: Currently intubated  Abdomen: Soft, nontender, nondistended, + bowel sounds  Extremities: warm dry without cyanosis clubbing or edema  Neuro: Unable to assess due to patient's current state. Patient currently posturing  Skin: Without rashes exudates or nodules  Psych: Unable to assess  Labs on Admission: I have personally reviewed following labs and imaging studies CBC:  Recent Labs Lab 2016-07-28 1425 07-28-2016 1433  WBC 25.4*  --   NEUTROABS 20.6*  --   HGB 11.2* 12.6  HCT 33.8* 37.0  MCV 89.7  --   PLT 406*  --    Basic Metabolic Panel:  Recent Labs Lab 01-Aug-2016 1425 2016-08-01 1433  NA 132* 133*  K 3.1* 3.2*  CL 99* 99*  CO2 18*  --   GLUCOSE 154* 154*  BUN 13 14  CREATININE 0.97 0.80  CALCIUM 8.5*  --    GFR: Estimated Creatinine Clearance: 45.8 mL/min (by C-G formula based on SCr of 0.8 mg/dL). Liver Function Tests:  Recent Labs Lab 2016-08-01 1425  AST 92*  ALT 80*  ALKPHOS 69  BILITOT 1.4*  PROT 6.6  ALBUMIN 2.9*   No results for input(s): LIPASE, AMYLASE in the last 168 hours. No results for input(s): AMMONIA in the last 168 hours. Coagulation Profile:  Recent Labs Lab 08-01-16 1425  INR 0.96   Cardiac Enzymes: No results for input(s): CKTOTAL, CKMB, CKMBINDEX, TROPONINI in the last 168 hours. BNP (last 3 results) No results for input(s): PROBNP in the last 8760 hours. HbA1C: No results for input(s): HGBA1C in the last 72 hours. CBG: No results for input(s): GLUCAP in the last 168 hours. Lipid Profile: No results for input(s): CHOL, HDL, LDLCALC, TRIG, CHOLHDL, LDLDIRECT in the last 72 hours. Thyroid Function Tests: No results for input(s): TSH, T4TOTAL, FREET4, T3FREE, THYROIDAB in the last 72 hours. Anemia Panel: No results for  input(s): VITAMINB12, FOLATE, FERRITIN, TIBC, IRON, RETICCTPCT in the last 72 hours. Urine analysis:    Component Value Date/Time   COLORURINE AMBER (A) 03/23/2016 2305   APPEARANCEUR CLOUDY (A) 03/23/2016 2305   LABSPEC 1.038 (H) 03/23/2016 2305   PHURINE 5.0 03/23/2016 2305   GLUCOSEU NEGATIVE 03/23/2016 2305   HGBUR NEGATIVE 03/23/2016 2305   BILIRUBINUR SMALL (A) 03/23/2016 2305   BILIRUBINUR neg 09/21/2015 1640   KETONESUR NEGATIVE 03/23/2016 2305   PROTEINUR NEGATIVE 03/23/2016 2305   UROBILINOGEN 0.2 09/21/2015 1640   UROBILINOGEN 0.2 08/04/2014 1306   NITRITE NEGATIVE 03/23/2016 2305   LEUKOCYTESUR TRACE (A) 03/23/2016 2305   Sepsis Labs: @LABRCNTIP (procalcitonin:4,lacticidven:4) )No results found for this or any previous visit (from the past 240 hour(s)).   Radiological Exams on Admission: Ct Cervical Spine Wo Contrast  Result Date: 08-01-16 CLINICAL DATA:  Posturing.  Fall.  Code stroke. EXAM: CT HEAD WITHOUT CONTRAST CT CERVICAL SPINE WITHOUT CONTRAST TECHNIQUE: Multidetector CT imaging of the head and cervical spine was performed following the standard protocol without intravenous contrast. Multiplanar CT image reconstructions of the cervical spine were also generated. COMPARISON:  03/24/2016 FINDINGS: CT HEAD FINDINGS Brain: Large mixed density subdural hematoma around the right cerebral convexity measuring up to 3 cm in thickness. Marked mass effect with left uncal and subfalcine herniation. Midline shift measures up to 26 mm. The right lateral ventricle is entrapped with temporal horn dilatation. No ACA or other infarct is noted. No contralateral hemorrhage. No visible brain edema. Vascular: Atherosclerotic calcification.  No hyperdense vessel. Skull: Left forehead swelling without underlying fracture Sinuses/Orbits: No acute finding.  Bilateral cataract resection. Other: Critical Value/emergent results were called by telephone at the time of interpretation on 01-Aug-2016  at 2:52 pm to Dr. Julianne Rice , who verbally acknowledged these results. CT CERVICAL SPINE FINDINGS Alignment: Normal Skull base and vertebrae: Negative for fracture Soft tissues and spinal canal: No prevertebral fluid or swelling. No visible canal hematoma. Visible portions of endotracheal orogastric tubes are in good position. Disc levels: Right paracentral  disc protrusion at C3-4 without suspected cord effect. Upper chest: No acute finding. IMPRESSION: 1. Large acute subdural hematoma around the left cerebral convexity with subfalcine and uncal herniation. Midline shift measures 2.6 cm. Right lateral ventricular entrapment. 2. No evidence of cervical spine injury. Electronically Signed   By: Monte Fantasia M.D.   On: 2016-07-28 14:57   Dg Chest Portable 1 View  Result Date: 07/28/2016 CLINICAL DATA:  Unresponsive.  Fall. EXAM: PORTABLE CHEST 1 VIEW COMPARISON:  03/23/2016 FINDINGS: ET tube tip is above the carina. There is a nasogastric tube with tip in the stomach. Heart size is normal. Aortic atherosclerosis. No pleural effusion or edema. No airspace opacities. IMPRESSION: 1. Support apparatus positioned as above. Electronically Signed   By: Kerby Moors M.D.   On: 07/28/16 14:57   Ct Head Code Stroke W/o Cm  Result Date: Jul 28, 2016 CLINICAL DATA:  Posturing.  Fall.  Code stroke. EXAM: CT HEAD WITHOUT CONTRAST CT CERVICAL SPINE WITHOUT CONTRAST TECHNIQUE: Multidetector CT imaging of the head and cervical spine was performed following the standard protocol without intravenous contrast. Multiplanar CT image reconstructions of the cervical spine were also generated. COMPARISON:  03/24/2016 FINDINGS: CT HEAD FINDINGS Brain: Large mixed density subdural hematoma around the right cerebral convexity measuring up to 3 cm in thickness. Marked mass effect with left uncal and subfalcine herniation. Midline shift measures up to 26 mm. The right lateral ventricle is entrapped with temporal horn  dilatation. No ACA or other infarct is noted. No contralateral hemorrhage. No visible brain edema. Vascular: Atherosclerotic calcification.  No hyperdense vessel. Skull: Left forehead swelling without underlying fracture Sinuses/Orbits: No acute finding.  Bilateral cataract resection. Other: Critical Value/emergent results were called by telephone at the time of interpretation on 2016/07/28 at 2:52 pm to Dr. Julianne Rice , who verbally acknowledged these results. CT CERVICAL SPINE FINDINGS Alignment: Normal Skull base and vertebrae: Negative for fracture Soft tissues and spinal canal: No prevertebral fluid or swelling. No visible canal hematoma. Visible portions of endotracheal orogastric tubes are in good position. Disc levels: Right paracentral disc protrusion at C3-4 without suspected cord effect. Upper chest: No acute finding. IMPRESSION: 1. Large acute subdural hematoma around the left cerebral convexity with subfalcine and uncal herniation. Midline shift measures 2.6 cm. Right lateral ventricular entrapment. 2. No evidence of cervical spine injury. Electronically Signed   By: Monte Fantasia M.D.   On: July 28, 2016 14:57    EKG: Independently reviewed. Sinus, rate 78  Assessment/Plan  Acute SDH -s/p fall -CT head: Large acute subdural hematoma around the left cerebral convexity with subfalcine and uncal herniation. Midline shift measures 2.6 cm. Right lateral ventricular entrapment. -Neurology and Neurosurgery consulted and appreciated, no intervention -Patient currently intubated but will be terminally extubated. -Patient's family opted for patient to be DNR. -Patient admitted strictly for comfort care/palliative -Placed on morphine and ativan drips  Iron deficiency Anemia Asthma GERD Essential Hypertension Diabetes mellitus  Depression /Anxiety Hypothyroidism   DVT prophylaxis: None, comfort care  Code Status: DNR  Family Communication: Family at bedside. Admission, patients  condition and plan of care including tests being ordered have been discussed with the family who indicate understanding and agree with the plan and Code Status.  Disposition Plan: Likely will expire in the hospital.   Consults called: Neurology, neurosurgery  Admission status: observation   Time spent: 60 minutes  Jazon Jipson D.O. [facility] Pager [phone]  If 7PM-7AM, please contact night-coverage [email or web address] Password [facility] 07-28-16, 5:56 PM

## 2016-07-28 NOTE — Progress Notes (Signed)
Responded to nurse's message that pt's family was in waiting rm while already in ED with another pt's family. Son and daughter were devastated when doctor informed them of mom's status. Son agreed DNR is what his mom would have wanted. Provided spiritual/emotional support and prayer as they called family members and sat by bedside of pt in Trauma C. She will be transported to 3 MW for maintenance to await arrival of other family members.   2016-07-20 1500  Clinical Encounter Type  Visited With Patient and family together  Visit Type Initial  Referral From Nurse  Spiritual Encounters  Spiritual Needs Prayer;Emotional;Grief support  Stress Factors  Patient Stress Factors Loss of control;Other (Comment) (end of life)  Family Stress Factors Family relationships;Health changes;Loss

## 2016-07-28 NOTE — ED Notes (Signed)
Pt transported to morgue 

## 2016-07-28 NOTE — Telephone Encounter (Signed)
FYI.  Received call from on call nurse.  Spoke to [facility] ER.  Per ER provider, pt had hemorrhagic stroke and passed away in ER.  [facility] was called to see pt and felt not a candidate for further intervention.  Patient passed in ER.

## 2016-07-28 NOTE — ED Triage Notes (Signed)
Pt to ED via [facility] with c/o stroke symptoms -- LSN 0800. On ems arrival, pt was on couch , had slurred speech, lethargic, but was able to follow commands. EMS spoke eto son, who wanted them to wait for him to get to house-- to check if this was pt's normal. Son stated that mother was different. Pt became nonverbal in ambulance, began posturing-- decorticate, on arrival to Ed-- PT is decerebrate posturing.

## 2016-07-28 NOTE — Progress Notes (Signed)
   08/08/2016 1800  Clinical Encounter Type  Visited With Family  Visit Type Patient actively dying;ED;Critical Care  Referral From Nurse  Spiritual Encounters  Spiritual Needs Grief support;Emotional  [place] spoke with family at bedside; [facility] will remain in ED for extubation and prayer at family request; [place] support as needed. Gwynn Burly 6:08 PM

## 2016-07-28 NOTE — ED Provider Notes (Signed)
[facility] DEPT Provider Note   CSN: [id number] Arrival date & time: Jul 17, 2016  1416   An emergency department physician performed an initial assessment on this suspected stroke patient at 1416 (Reve Crocket).  History   Chief Complaint Chief Complaint  Patient presents with  . Code Stroke    HPI Monica Copeland is a 75 y.o. female.  HPI Patient presents as code stroke by EMS. She is unresponsive and unable to contribute history. Level V caveat applies. Per EMS patient fell on Saturday striking her head. Refused transport to the emergency department. She was seen by her son at 8:00 and was at her baseline mental status. She called EMS for increasing weakness and lethargy. He was able to get up off the couch and open the door for EMS. Had rapid decline in transport becoming unresponsive and posturing. Past Medical History:  Diagnosis Date  . Anxiety   . Asthma   . Asthma   . Back pain   . 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 Diabetes mellitus without complication ([place])   . Emphysema of lung ([place])   . Gastric AVM   . GERD (gastroesophageal reflux disease) 01/24/2012  . Hemorrhoid   . Hyperlipemia   . Hyperlipidemia   . Hypertension   . IBS (irritable bowel syndrome)   . Iron deficiency anemia   . Seizures ([place])   . Ulcer [facility])     Patient Active Problem List   Diagnosis Date Noted  . AKI (acute kidney injury) ([place])   . Hypokalemia   . Emesis 03/24/2016  . Right lower quadrant abdominal pain 03/24/2016  . Elevated troponin   . Right arm pain 02/22/2016  . Symptomatic bradycardia 01/19/2016  . ST elevation (STEMI) myocardial infarction involving right coronary artery ([place]) 01/05/2016  . HTN (hypertension) 01/05/2016  . VF (ventricular fibrillation) ([place])   . Bradycardia   . Balance problem 04/19/2015  . Knee pain 01/18/2015  . Breast pain in female 11/16/2014  . Malnutrition of moderate degree ([place]) 08/05/2014  . Abdominal pain 08/04/2014  . Altered mental status  08/04/2014  . Acute encephalopathy 08/04/2014  . UTI (urinary tract infection) 07/15/2014  . Diverticulitis of colon without hemorrhage 07/15/2014  . Diarrhea 07/15/2014  . Tobacco use disorder 10/28/2013  . Plantar fasciitis, bilateral 05/08/2013  . Memory loss 04/10/2013  . 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 Renal insufficiency 12/12/2012  . Anxiety and depression 11/29/2012  . Bronchitis with asthma, acute 10/26/2012  . Hip pain, bilateral 09/12/2012  . Hallucination 09/12/2012  . TIA (transient ischemic attack) 04/25/2012  . Hemiplegia, unspecified, affecting nondominant side 04/25/2012  . 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 Gastric ulcer with hemorrhage 01/24/2012  . Candida esophagitis ([place]) 01/24/2012  . Esophageal candidiasis ([place]) 01/24/2012  . Pyloric ulcer 01/24/2012  . Blood in stool 01/23/2012  . Acute posthemorrhagic anemia 01/23/2012  . Dysphagia, unspecified(787.20) 01/23/2012  . Tachycardia 01/21/2012  . Chronic back pain 01/21/2012  . H/O brain surgery 01/21/2012  . Hyperlipidemia 01/21/2012    Past Surgical History:  Procedure Laterality Date  . [place]     [place]  . APPENDECTOMY    . 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 candida esophagitis    . CARDIAC CATHETERIZATION N/A 01/05/2016   Procedure: Left Heart Cath and Coronary Angiography;  Surgeon: Troy Sine, MD;  Location: [facility] CV LAB;  Service: Cardiovascular;  Laterality: N/A;  . CARDIAC CATHETERIZATION N/A 01/05/2016  Procedure: Coronary Stent Intervention;  Surgeon: Troy Sine, MD;  Location: [facility] CV LAB;  Service: Cardiovascular;  Laterality: N/A;  . CARDIAC CATHETERIZATION N/A 01/05/2016   Procedure: Temporary Pacemaker;  Surgeon: Troy Sine, MD;  Location: [facility] LAB;  Service: Cardiovascular;  Laterality: N/A;  . COLON SURGERY     colon perforation  . pylorus ulcer      OB History    No data  available       Home Medications    Prior to Admission medications   Medication Sig Start Date End Date Taking? Authorizing Provider  albuterol (PROAIR HFA) 108 (90 Base) MCG/ACT inhaler Inhale 1-2 puffs into the lungs every 6 (six) hours as needed for wheezing or shortness of breath. 01/13/16   Yvonne R Lowne Chase, DO  ALPRAZolam (XANAX) 0.5 MG tablet TAKE 1/2 TO 1 TABLET BY MOUTH 2 TIMES A DAY AS NEEDED FOR ANXIETY. 03/09/16   Rosalita Chessman Chase, DO  aspirin EC 81 MG EC tablet Take 1 tablet (81 mg total) by mouth daily. 01/08/16   Arbutus Leas, NP  atorvastatin (LIPITOR) 80 MG tablet Take 80 mg by mouth daily at 6 PM.  01/08/16   Historical Provider, MD  budesonide-formoterol (SYMBICORT) 80-4.5 MCG/ACT inhaler Inhale 2 puffs into the lungs daily as needed. For shortness of breath 01/13/16   Rosalita Chessman Chase, DO  diclofenac sodium (VOLTAREN) 1 % GEL Apply 4 g topically 4 (four) times daily. 01/13/16   Alferd Apa Lowne Chase, DO  diphenhydramine-acetaminophen (TYLENOL PM) 25-500 MG TABS tablet Take 1 tablet by mouth at bedtime as needed. For sleep    Historical Provider, MD  famotidine (PEPCID) 20 MG tablet Take 1 tablet (20 mg total) by mouth at bedtime. 03/26/16   Barton Dubois, MD  feeding supplement, ENSURE COMPLETE, (ENSURE COMPLETE) LIQD Take 237 mLs by mouth 2 (two) times daily between meals. 08/06/14   Robbie Lis, MD  fenofibrate 160 MG tablet TAKE 1 TABLET BY MOUTH ONCE DAILY 01/14/16   Rosalita Chessman Chase, DO  ferrous sulfate 325 (65 FE) MG tablet Take 1 tablet (325 mg total) by mouth 2 (two) times daily with a meal. 03/26/16   Barton Dubois, MD  FLUoxetine (PROZAC) 20 MG capsule TAKE 1 CAPSULE BY MOUTH ONCE DAILY 06/19/16   Alferd Apa Lowne Chase, DO  guaiFENesin-dextromethorphan (ROBITUSSIN DM) 100-10 MG/5ML syrup Take 5 mLs by mouth every 6 (six) hours as needed for cough. Patient not taking: Reported on 03/27/2016 03/26/16   Barton Dubois, MD  hyoscyamine (LEVSIN SL) 0.125 MG SL  tablet PLACE 1 TABLET UNDER THE TONGUE EVERY 4 HOURS AS NEEDED. 09/27/15   Rosalita Chessman Chase, DO  levothyroxine (SYNTHROID, LEVOTHROID) 25 MCG tablet Take 1 tablet (25 mcg total) by mouth daily before breakfast. 05/25/16   Rosalita Chessman Chase, DO  lisinopril (PRINIVIL,ZESTRIL) 2.5 MG tablet Take 1 tablet (2.5 mg total) by mouth daily. 01/08/16   Arbutus Leas, NP  nicotine (NICODERM CQ - DOSED IN MG/24 HOURS) 21 mg/24hr patch Place 1 patch (21 mg total) onto the skin daily. Patient not taking: Reported on 03/27/2016 03/26/16   Barton Dubois, MD  pantoprazole (PROTONIX) 40 MG tablet Take 1 tablet (40 mg total) by mouth 2 (two) times daily. 03/26/16   Barton Dubois, MD  predniSONE (DELTASONE) 5 MG tablet Take 1 tablet (5 mg total) by mouth daily. 10/12/15   Renato Shin, MD  ticagrelor (BRILINTA) 90 MG TABS  tablet Take 1 tablet (90 mg total) by mouth 2 (two) times daily. 01/08/16   Arbutus Leas, NP    Family History Family History  Problem Relation Age of Onset  . Hypertension Mother   . Heart attack Mother   . Diabetes Father   . Asthma Father   . Lung cancer Father   . Hypertension Brother   . Asthma Son   . Esophageal cancer Brother   . Liver cancer Brother     mets from esophagus  . Colon cancer Neg Hx     Social History Social History  Substance Use Topics  . Smoking status: Current Some Day Smoker    Packs/day: 0.00    Types: Cigarettes  . Smokeless tobacco: Never Used     Comment: Started smoking at age 16.form given 04-02-13  . Alcohol use No     Allergies   Tomato   Review of Systems Review of Systems  Unable to perform ROS: Mental status change     Physical Exam Updated Vital Signs BP 121/87   Pulse 67   Temp 98 F (36.7 C) (Oral)   Resp 20   Ht 5\' 1"  (1.549 m)   Wt 120 lb (54.4 kg)   SpO2 100%   BMI 22.67 kg/m   Physical Exam  Constitutional: She appears well-developed and well-nourished.  HENT:  Head: Normocephalic.  Mouth/Throat: Oropharynx is  clear and moist.  Left frontal and periorbital ecchymosis.   Eyes: EOM are normal.  Pupils are not reactive to light. Left pupil is 5 mm and right pupil is 3 mm.  Neck: No JVD present.  Cardiovascular: Normal rate and regular rhythm.   Pulmonary/Chest: Effort normal and breath sounds normal. No respiratory distress. She has no wheezes. She has no rales. She exhibits no tenderness.  Abdominal: Soft. Bowel sounds are normal. There is no tenderness. There is no rebound and no guarding.  Musculoskeletal: Normal range of motion. She exhibits no edema or tenderness.  Neurological:  GCS of 3. Patient is actively posturing. Upgoing bilateral Babinski's  Skin: Skin is warm and dry. Capillary refill takes less than 2 seconds. No rash noted. No erythema.  Nursing note and vitals reviewed.    ED Treatments / Results  Labs (all labs ordered are listed, but only abnormal results are displayed) Labs Reviewed  CBC - Abnormal; Notable for the following:       Result Value   WBC 25.4 (*)    RBC 3.77 (*)    Hemoglobin 11.2 (*)    HCT 33.8 (*)    Platelets 406 (*)    All other components within normal limits  DIFFERENTIAL - Abnormal; Notable for the following:    Neutro Abs 20.6 (*)    Monocytes Absolute 1.8 (*)    All other components within normal limits  COMPREHENSIVE METABOLIC PANEL - Abnormal; Notable for the following:    Sodium 132 (*)    Potassium 3.1 (*)    Chloride 99 (*)    CO2 18 (*)    Glucose, Bld 154 (*)    Calcium 8.5 (*)    Albumin 2.9 (*)    AST 92 (*)    ALT 80 (*)    Total Bilirubin 1.4 (*)    GFR calc non Af Amer 56 (*)    All other components within normal limits  I-STAT CHEM 8, ED - Abnormal; Notable for the following:    Sodium 133 (*)    Potassium 3.2 (*)  Chloride 99 (*)    Glucose, Bld 154 (*)    Calcium, Ion 0.94 (*)    All other components within normal limits  ETHANOL  PROTIME-INR  APTT  RAPID URINE DRUG SCREEN, HOSP PERFORMED  URINALYSIS, ROUTINE W  REFLEX MICROSCOPIC (NOT AT [facility])  I-STAT TROPOININ, ED    EKG  EKG Interpretation None       Radiology Ct Cervical Spine Wo Contrast  Result Date: 2016-07-21 CLINICAL DATA:  Posturing.  Fall.  Code stroke. EXAM: CT HEAD WITHOUT CONTRAST CT CERVICAL SPINE WITHOUT CONTRAST TECHNIQUE: Multidetector CT imaging of the head and cervical spine was performed following the standard protocol without intravenous contrast. Multiplanar CT image reconstructions of the cervical spine were also generated. COMPARISON:  03/24/2016 FINDINGS: CT HEAD FINDINGS Brain: Large mixed density subdural hematoma around the right cerebral convexity measuring up to 3 cm in thickness. Marked mass effect with left uncal and subfalcine herniation. Midline shift measures up to 26 mm. The right lateral ventricle is entrapped with temporal horn dilatation. No ACA or other infarct is noted. No contralateral hemorrhage. No visible brain edema. Vascular: Atherosclerotic calcification.  No hyperdense vessel. Skull: Left forehead swelling without underlying fracture Sinuses/Orbits: No acute finding.  Bilateral cataract resection. Other: Critical Value/emergent results were called by telephone at the time of interpretation on 07/21/2016 at 2:52 pm to Dr. Julianne Rice , who verbally acknowledged these results. CT CERVICAL SPINE FINDINGS Alignment: Normal Skull base and vertebrae: Negative for fracture Soft tissues and spinal canal: No prevertebral fluid or swelling. No visible canal hematoma. Visible portions of endotracheal orogastric tubes are in good position. Disc levels: Right paracentral disc protrusion at C3-4 without suspected cord effect. Upper chest: No acute finding. IMPRESSION: 1. Large acute subdural hematoma around the left cerebral convexity with subfalcine and uncal herniation. Midline shift measures 2.6 cm. Right lateral ventricular entrapment. 2. No evidence of cervical spine injury. Electronically Signed   By: Monte Fantasia M.D.   On: 2016/07/21 14:57   Dg Chest Portable 1 View  Result Date: 07/21/16 CLINICAL DATA:  Unresponsive.  Fall. EXAM: PORTABLE CHEST 1 VIEW COMPARISON:  03/23/2016 FINDINGS: ET tube tip is above the carina. There is a nasogastric tube with tip in the stomach. Heart size is normal. Aortic atherosclerosis. No pleural effusion or edema. No airspace opacities. IMPRESSION: 1. Support apparatus positioned as above. Electronically Signed   By: Kerby Moors M.D.   On: Jul 21, 2016 14:57   Ct Head Code Stroke W/o Cm  Result Date: 07-21-16 CLINICAL DATA:  Posturing.  Fall.  Code stroke. EXAM: CT HEAD WITHOUT CONTRAST CT CERVICAL SPINE WITHOUT CONTRAST TECHNIQUE: Multidetector CT imaging of the head and cervical spine was performed following the standard protocol without intravenous contrast. Multiplanar CT image reconstructions of the cervical spine were also generated. COMPARISON:  03/24/2016 FINDINGS: CT HEAD FINDINGS Brain: Large mixed density subdural hematoma around the right cerebral convexity measuring up to 3 cm in thickness. Marked mass effect with left uncal and subfalcine herniation. Midline shift measures up to 26 mm. The right lateral ventricle is entrapped with temporal horn dilatation. No ACA or other infarct is noted. No contralateral hemorrhage. No visible brain edema. Vascular: Atherosclerotic calcification.  No hyperdense vessel. Skull: Left forehead swelling without underlying fracture Sinuses/Orbits: No acute finding.  Bilateral cataract resection. Other: Critical Value/emergent results were called by telephone at the time of interpretation on July 21, 2016 at 2:52 pm to Dr. Julianne Rice , who verbally acknowledged these results. CT CERVICAL SPINE FINDINGS  Alignment: Normal Skull base and vertebrae: Negative for fracture Soft tissues and spinal canal: No prevertebral fluid or swelling. No visible canal hematoma. Visible portions of endotracheal orogastric tubes are in good position.  Disc levels: Right paracentral disc protrusion at C3-4 without suspected cord effect. Upper chest: No acute finding. IMPRESSION: 1. Large acute subdural hematoma around the left cerebral convexity with subfalcine and uncal herniation. Midline shift measures 2.6 cm. Right lateral ventricular entrapment. 2. No evidence of cervical spine injury. Electronically Signed   By: Monte Fantasia M.D.   On: 07/19/2016 14:57    Procedures Procedure Name: Intubation Date/Time: 07-19-16 3:05 PM Performed by: Lita Mains, Tavin Vernet Pre-anesthesia Checklist: Patient identified Oxygen Delivery Method: Ambu bag Preoxygenation: Pre-oxygenation with 100% oxygen Intubation Type: Rapid sequence and Cricoid Pressure applied Ventilation: Mask ventilation without difficulty Laryngoscope Size: Glidescope and 3 Tube size: 7.5 mm Number of attempts: 1 Airway Equipment and Method: Video-laryngoscopy Placement Confirmation: ETT inserted through vocal cords under direct vision,  Positive ETCO2 and Breath sounds checked- equal and bilateral Secured at: 20 cm      (including critical care time) CRITICAL CARE Performed by: Lita Mains, Darnell Jeschke Total critical care time: 40 minutes Critical care time was exclusive of separately billable procedures and treating other patients. Critical care was necessary to treat or prevent imminent or life-threatening deterioration. Critical care was time spent personally by me on the following activities: development of treatment plan with patient and/or surrogate as well as nursing, discussions with consultants, evaluation of patient's response to treatment, examination of patient, obtaining history from patient or surrogate, ordering and performing treatments and interventions, ordering and review of laboratory studies, ordering and review of radiographic studies, pulse oximetry and re-evaluation of patient's condition. Medications Ordered in ED Medications  etomidate (AMIDATE) injection (20  mg Intravenous Given Jul 19, 2016 1424)  succinylcholine (ANECTINE) injection (100 mg Intravenous Given 19-Jul-2016 1426)     Initial Impression / Assessment and Plan / ED Course  I have reviewed the triage vital signs and the nursing notes.  Pertinent labs & imaging results that were available during my care of the patient were reviewed by me and considered in my medical decision making (see chart for details).  Clinical Course    Patient intubated emergently. Taken to CT scanner where images demonstrated large left subdural hematoma with herniation. Discussed with Dr. Ronnald Ramp who is at bedside. Does not believe patient is a candidate for surgery given severity of symptoms. He will discuss with family and disposition.   Final Clinical Impressions(s) / ED Diagnoses   Final diagnoses:  Subdural hematoma ([place])  Acute respiratory failure, unspecified whether with hypoxia or hypercapnia [facility])    New Prescriptions New Prescriptions   No medications on file     Julianne Rice, MD 2016-07-19 1539

## 2016-07-28 NOTE — ED Notes (Signed)
EDP advised RN that pt. will be extubated once pt. arrive at her room .

## 2016-07-28 NOTE — ED Notes (Signed)
Dr. Shon Hale made aware of pts tremors with extensions of arms.

## 2016-07-28 NOTE — Procedures (Signed)
Intubation Procedure Note SAFIRE BOLASH [id number] 1940/10/09  Procedure: Intubation Indications: Airway protection and maintenance  Procedure Details Consent: Risks of procedure as well as the alternatives and risks of each were explained to the (patient/caregiver).  Consent for procedure obtained. Time Out: Verified patient identification, verified procedure, site/side was marked, verified correct patient position, special equipment/implants available, medications/allergies/relevent history reviewed, required imaging and test results available.  Performed  Maximum sterile technique was used including cap.  MAC    Evaluation Hemodynamic Status: BP stable throughout; O2 sats: stable throughout Patient's Current Condition: stable Complications: No apparent complications Patient did tolerate procedure well. Chest X-ray ordered to verify placement.  CXR: pending.   Monica Copeland 08-04-2016

## 2016-07-28 NOTE — ED Notes (Signed)
Paged admitting Dr. Cleopatra Cedar @[phone] 

## 2016-07-28 NOTE — ED Provider Notes (Signed)
Pt comes in as code stroke. Noted to have SDH, acute with midline shift and likely mass effect. Neurology and Neurosurgery have assessed - no further intervention recommended. Pt is DNR/DNI. She was intubated prior to the code declaration. Family discussing further care -comfort care only vs. Expectant management. Results from the ER workup discussed with the patient face to face and all questions answered to the best of my ability.  CRITICAL CARE Performed by: Varney Biles   Total critical care time: 35 minutes  Critical care time was exclusive of separately billable procedures and treating other patients.  Critical care was necessary to treat or prevent imminent or life-threatening deterioration.  Critical care was time spent personally by me on the following activities: development of treatment plan with patient and/or surrogate as well as nursing, discussions with consultants, evaluation of patient's response to treatment, examination of patient, obtaining history from patient or surrogate, ordering and performing treatments and interventions, ordering and review of laboratory studies, ordering and review of radiographic studies, pulse oximetry and re-evaluation of patient's condition.   Varney Biles, MD 07/13/16 [phone]

## 2016-07-28 NOTE — ED Notes (Signed)
Bed placement notified on pt.'s demise.

## 2016-07-28 NOTE — ED Notes (Signed)
Family requested to extubate pt. at ER , EDP explained delay on room assignment , RT notified to extubate pt.

## 2016-07-28 NOTE — ED Notes (Signed)
Paged Government social research officer to [place], Therapist, sports

## 2016-07-28 NOTE — ED Notes (Signed)
Pt. expired , family at bedside , post mortem care provided , [facility] notified.

## 2016-07-28 NOTE — Consult Note (Signed)
Neurology Consult Note  Reason for Consultation: CODE STROKE  Requesting provider: EMS activated in field; ED MD Julianne Rice  CC: patient is unresponsive and unable to provide  HPI: This is a 75-yo woman who is brought to the ED by EMS as a CODE STROKE. History is per d/w EMS and ED staff as the patient is unable to provide any history and no family is present.   EMS reported they were called to the patient's residence to evaluate her for slurred speech. It sounds as if her son spoke with her at 8:00 and she seemed normal at that time. However, she was subsequently noted to have some slurred speech which prompted the phone call to EMS. On their arrival, they noted the patient appeared to be lethargic with slurred speech. They did not appreciate any focal neurological deficits. She was able to follow commands and was interacting, though slow. They spoke with the patient's son who asked him to wait for him to get to his mother's house so he could check on her because she has some slurred speech and possible sedation related to her medications. He wanted to see if this was her normal state but on his arrival realized that it was not. EMS reports that when they tried to get the patient off her couch, she had difficulty standing up. She gradually became less responsive and during transport began demonstrating decorticate posturing. By the time she arrived in the emergency department she was showing decerebrate posturing. She was unresponsive. On arrival in the emergency department she was emergently intubated via rapid sequence intubation. She was noted to have decerebrate posturing. Her pupils were unequal with the left slightly larger than the right, neither reactive. She was taken for an emergent CT scan of the head which revealed a large right sided subdural hematoma with herniation.  Neurosurgery was consulted and evaluated the patient. Due to her age, loss of brainstem reflexes, and decerebrate  posturing and was felt that craniotomy was not likely to be beneficial, particularly given involvement of her dominant hemisphere. He was going to discuss this with the family to formulate a definitive plan.  Last known well: 0800 NIHSS 28 tPA given?: No, subdural hematoma    PMH:  Past Medical History:  Diagnosis Date  . Anxiety   . Asthma   . Asthma   . Back pain   . 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 Diabetes mellitus without complication ([place])   . Emphysema of lung ([place])   . Gastric AVM   . GERD (gastroesophageal reflux disease) 01/24/2012  . Hemorrhoid   . Hyperlipemia   . Hyperlipidemia   . Hypertension   . IBS (irritable bowel syndrome)   . Iron deficiency anemia   . Seizures ([place])   . Ulcer ([place])     PSH:  Past Surgical History:  Procedure Laterality Date  . [place]  . APPENDE[place]OM[place]    . 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 candida esophagitis    . CARDIAC CATHETERIZATION N/A 01/05/2016   Procedure: Left Heart Cath and Coronary Angiography;  Surgeon: Troy Sine, MD;  Location: [place] [facility];  Service: Cardiovascular;  Laterality: N/A;  . CARDIAC CATHETERIZATION N/A 01/05/2016   Procedure: Coronary Stent Intervention;  Surgeon: Troy Sine, MD;  Location: [place] [facility];  Service: Cardiovascular;  Laterality: N/A;  . CARDIAC CATHETERIZATION N/A 01/05/2016   Procedure: Temporary Pacemaker;  Surgeon: Troy Sine,  MD;  Location: [place] [facility];  Service: Cardiovascular;  Laterality: N/A;  . COLON SURGERY     colon perforation  . pylorus ulcer      Family history: Family History  Problem Relation Age of Onset  . Hypertension Mother   . Heart attack Mother   . Diabetes Father   . Asthma Father   . Lung cancer Father   . Hypertension Brother   . Asthma Son   . Esophageal cancer Brother   . Liver cancer Brother     mets from esophagus  . Colon cancer Neg Hx     Social history:  Social History   Social History   . Marital status: Widowed    Spouse name: N/A  . Number of children: N/A  . Years of education: N/A   Occupational History  . Not on file.   Social History Main Topics  . Smoking status: Current Some Day Smoker    Packs/day: 0.00    Types: Cigarettes  . Smokeless tobacco: Never Used     Comment: Started smoking at age 13.form given 04-02-13  . Alcohol use No  . Drug use: No  . Sexual activity: Not on file   Other Topics Concern  . Not on file   Social History Narrative   ** Merged History Encounter **        Current outpatient meds: No outpatient prescriptions have been marked as taking for the 2016-07-24 encounter [facility] Encounter).    Current inpatient meds:  No current facility-administered medications for this encounter.    Current Outpatient Prescriptions  Medication Sig Dispense Refill  . albuterol (PROAIR HFA) 108 (90 Base) MCG/ACT inhaler Inhale 1-2 puffs into the lungs every 6 (six) hours as needed for wheezing or shortness of breath. 1 Inhaler 3  . ALPRAZolam (XANAX) 0.5 MG tablet TAKE 1/2 TO 1 TABLET BY MOUTH 2 TIMES A DAY AS NEEDED FOR ANXIETY. 60 tablet 0  . aspirin EC 81 MG EC tablet Take 1 tablet (81 mg total) by mouth daily.    Marland Kitchen atorvastatin (LIPITOR) 80 MG tablet Take 80 mg by mouth daily at 6 PM.     . budesonide-formoterol (SYMBICORT) 80-4.5 MCG/ACT inhaler Inhale 2 puffs into the lungs daily as needed. For shortness of breath 1 Inhaler 3  . diclofenac sodium (VOLTAREN) 1 % GEL Apply 4 g topically 4 (four) times daily. 100 g 3  . diphenhydramine-acetaminophen (TYLENOL PM) 25-500 MG TABS tablet Take 1 tablet by mouth at bedtime as needed. For sleep    . famotidine (PEPCID) 20 MG tablet Take 1 tablet (20 mg total) by mouth at bedtime. 60 tablet 1  . feeding supplement, ENSURE COMPLETE, (ENSURE COMPLETE) LIQD Take 237 mLs by mouth 2 (two) times daily between meals. 237 mL 0  . fenofibrate 160 MG tablet TAKE 1 TABLET BY MOUTH ONCE DAILY 30 tablet 5  .  ferrous sulfate 325 (65 FE) MG tablet Take 1 tablet (325 mg total) by mouth 2 (two) times daily with a meal. 60 tablet 1  . FLUoxetine (PROZAC) 20 MG capsule TAKE 1 CAPSULE BY MOUTH ONCE DAILY 30 capsule 6  . guaiFENesin-dextromethorphan (ROBITUSSIN DM) 100-10 MG/5ML syrup Take 5 mLs by mouth every 6 (six) hours as needed for cough. (Patient not taking: Reported on 03/27/2016) 118 mL 0  . hyoscyamine (LEVSIN SL) 0.125 MG SL tablet PLACE 1 TABLET UNDER THE TONGUE EVERY 4 HOURS AS NEEDED. 30 tablet 0  . levothyroxine (SYNTHROID, LEVOTHROID) 25  MCG tablet Take 1 tablet (25 mcg total) by mouth daily before breakfast. 30 tablet 2  . lisinopril (PRINIVIL,ZESTRIL) 2.5 MG tablet Take 1 tablet (2.5 mg total) by mouth daily. 30 tablet 12  . nicotine (NICODERM CQ - DOSED IN MG/24 HOURS) 21 mg/24hr patch Place 1 patch (21 mg total) onto the skin daily. (Patient not taking: Reported on 03/27/2016) 28 patch 0  . pantoprazole (PROTONIX) 40 MG tablet Take 1 tablet (40 mg total) by mouth 2 (two) times daily. 60 tablet 1  . predniSONE (DELTASONE) 5 MG tablet Take 1 tablet (5 mg total) by mouth daily. 90 tablet 3  . ticagrelor (BRILINTA) 90 MG TABS tablet Take 1 tablet (90 mg total) by mouth 2 (two) times daily. 60 tablet 12    Allergies: Allergies  Allergen Reactions  . Tomato Rash    ROS: As per HPI. A full 14-point review of systems cannot be obtained as the patient is unable to participate.   PE:  BP 121/87   Pulse 67   Temp 98 F (36.7 C) (Oral)   Resp 20   Ht 5\' 1"  (1.549 m)   Wt 54.4 kg (120 lb)   SpO2 100%   BMI 22.67 kg/m   General: Elderly Caucasian woman lying on ED gurney. She is intubated and on a propofol drip. She is unresponsive to verbal, tactile, and noxious stimuli. There is no eye opening, either spontaneously or with stimulation.  HEENT: She has an abrasion over the left eye brow with ecchymosis surrounding the left eye. Neck supple without LAD. ETT in place. Sclerae anicteric. No  conjunctival injection.  CV: Regular, no murmur. Carotid pulses full and symmetric, no bruits. Distal pulses 2+ and symmetric.  Lungs: Coarse breath sounds noted bilaterally on anterior exam. She is ventilated.  Abdomen: Soft, non-distended, no rebound or guarding. Bowel sounds present x4.  Extremities: No C/C/E. She has areas of contusion on the left shoulder and around the left axilla. Neuro:  CN: Pupils are unequal with the L 5 mm and the R 4 mm. Neither reacts to light. She does not blink to visual threat. She has absent oculcephalics. She has initially had brisk corneals that became less pronounced on serial exams. Her face appears grossly symmetric but is partly obscured by tubes and tape. She has intact cough with ETT manipulation. The remainder of her cranial nerves cannot be accurately assessed as she is unable to participate with the exam.   Motor: Normal bulk for age. Tone is increased throughout. She has occasional spontaneous extensor posturing. Sensation: She has extensor posturing to any noxious stimulation.  DTRs: 3+, symmetric. She has sustained clonus at the ankles. Toes upgoing bilaterally.  Coordination and gait: These cannot be assessed as the patient is unable to participate with the exam.   Labs:  Lab Results  Component Value Date   WBC 25.4 (H) 07-25-2016   HGB 12.6 Jul 25, 2016   HCT 37.0 July 25, 2016   PLT 406 (H) 25-Jul-2016   GLUCOSE 154 (H) 07/25/16   CHOL 174 01/19/2016   TRIG 159 (H) 01/19/2016   HDL 52 01/19/2016   LDLDIRECT 128.6 05/28/2014   LDLCALC 90 01/19/2016   ALT 80 (H) 07/25/2016   AST 92 (H) 25-Jul-2016   NA 133 (L) 07/25/16   K 3.2 (L) Jul 25, 2016   CL 99 (L) 07-25-16   CREATININE 0.80 07/25/16   BUN 14 July 25, 2016   CO2 18 (L) Jul 25, 2016   TSH 0.33 (L) 04/10/2016   INR 0.96 25-Jul-2016  HGBA1C 6.1 (H) 01/05/2016   MICROALBUR 0.9 12/07/2014    Imaging: I have personally and independently reviewed the [facility] without contrast from today.  This shows a large left-sided subdural hematoma that measures 29.5 mm at its widest point. This essentially surrounds the left cerebral hemisphere. This results in 26.3 mm of left-to-right midline shift with compression of the upper brainstem.   Assessment and Plan:  1. Subdural hematoma: She has a large left-sided subdural hematoma with resultant midline shift and compression of the upper brainstem. This is likely traumatic in etiology. Her clinical exam is notable for loss of upper brainstem reflexes and decerebrate posturing. Neurosurgery has evaluated the scan and the patient and feel that craniotomy is not likely to result in significant recovery to an acceptable quality of life given her current examination, her age, and involvement of the dominant cerebral hemisphere. They favor comfort measures. Family has made the patient DO NOT RESUSCITATE.  2. Coma: This is due to subdural hematoma with herniation. Prognosis is poor. Recommend supportive care.  This was discussed with the ED attending and the consulting neurosurgeon, Dr. Ronnald Ramp. At this point I have no additional recommendations and neurology will sign off. Please call with any additional questions or concerns.  This patient is critically ill and at significant risk of neurological worsening, death and care requires constant monitoring of vital signs, hemodynamics,respiratory and cardiac monitoring, neurological assessment, discussion with family, other specialists and medical decision making of high complexity. A total of 70 minutes of critical care time was spent on this case.

## 2016-07-28 NOTE — ED Notes (Signed)
Family at bedside. Monitors placed on comfort care.

## 2016-07-28 NOTE — ED Notes (Signed)
Pt. extubated by RT , family at bedside , Morphine and Ativan drip infusing .

## 2016-07-28 DEATH — deceased

## 2016-11-29 IMAGING — CT CT ABD-PELV W/ CM
2 of 5 series · 16 of 46 positions shown, 18 images · IV contrast (APPLIED)
Comparison: Abdominal CT dated 10/11/2015

CLINICAL DATA: 74-year-old female with abdominal pain. History of
ulcer, and IBS.

EXAM:
CT ABDOMEN AND PELVIS WITH CONTRAST
TECHNIQUE: Multidetector CT imaging of the abdomen and pelvis was performed
using the standard protocol following bolus administration of
intravenous contrast.
CONTRAST:  1 Q6LXJZ-NYY IOPAMIDOL (Q6LXJZ-NYY) INJECTION 61%

[Series 2: abd/ pelvis 5.0 i30f 1 · axial · 0.70mm/px · z∈[-814,-444]mm · 13 of 84 slices shown, 15 images]
[im 5/84  soft-tissue]
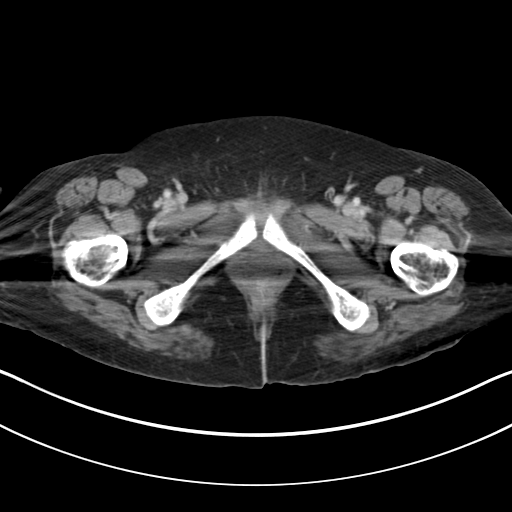
[im 5/84  bone]
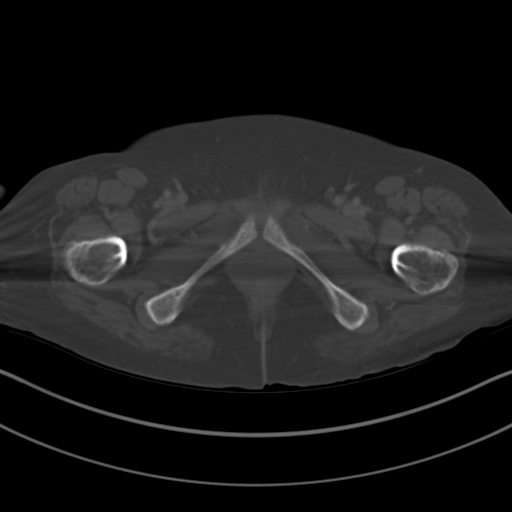
[im 13/84  soft-tissue]
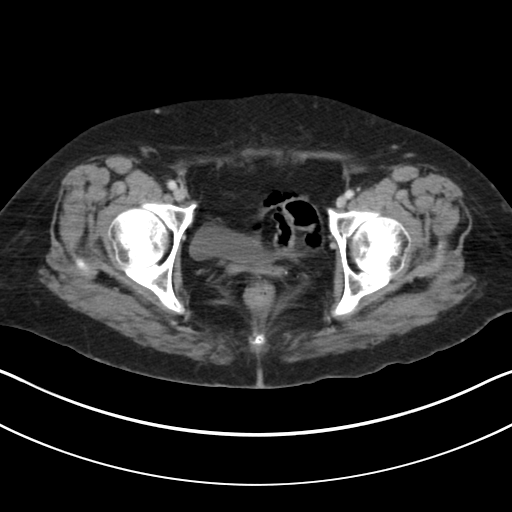
[im 17/84  soft-tissue]
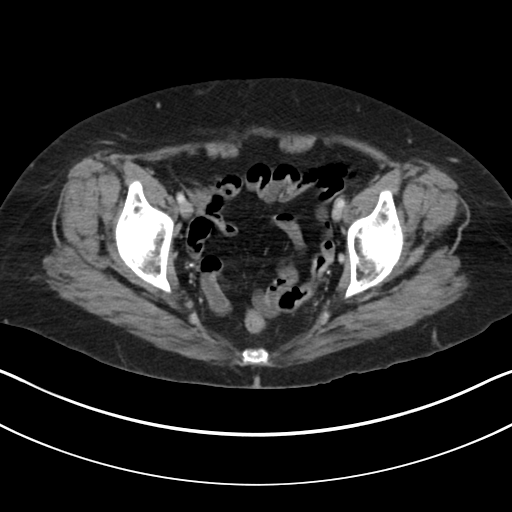
[im 25/84  soft-tissue]
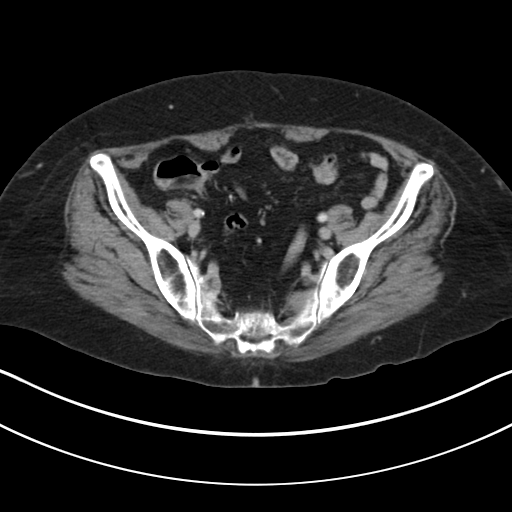
[im 30/84  soft-tissue]
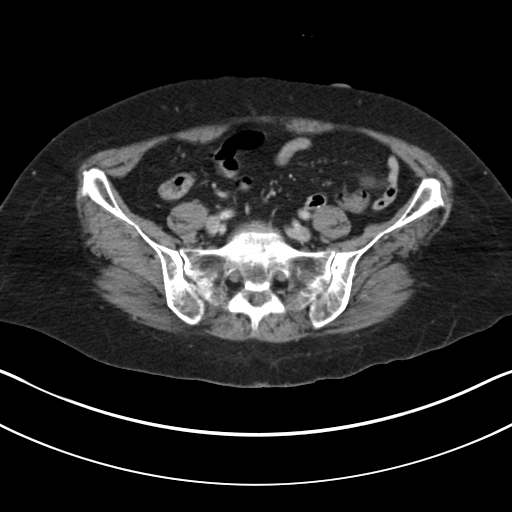
[im 38/84  soft-tissue]
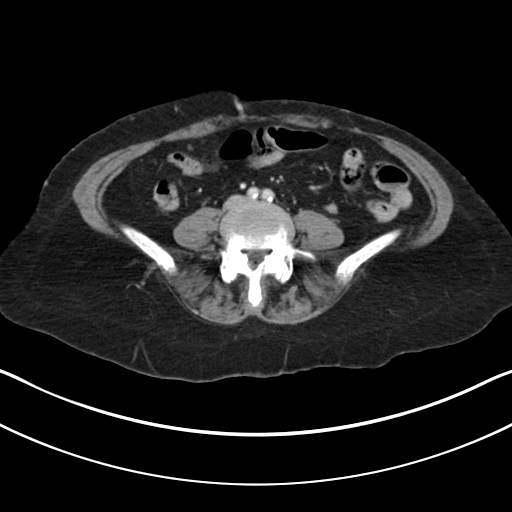
[im 42/84  soft-tissue]
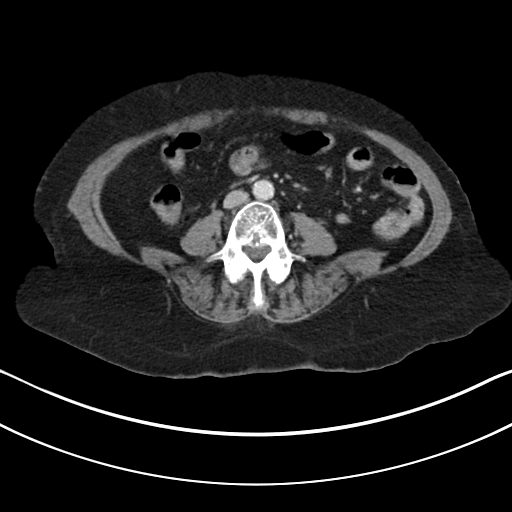
[im 46/84  soft-tissue]
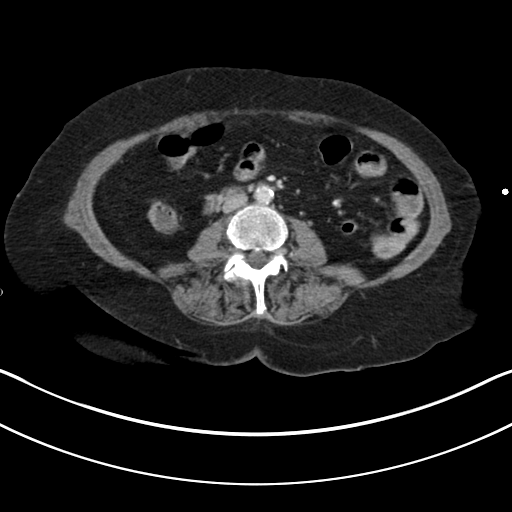
[im 54/84  soft-tissue]
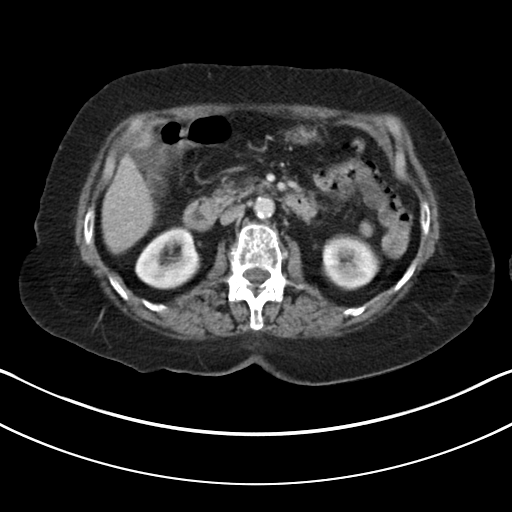
[im 54/84  bone]
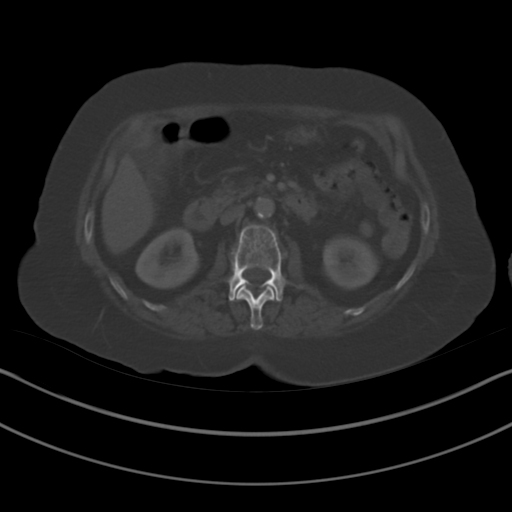
[im 59/84  soft-tissue]
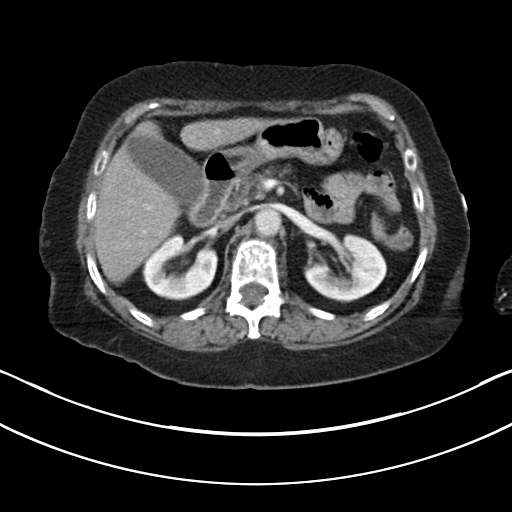
[im 67/84  soft-tissue]
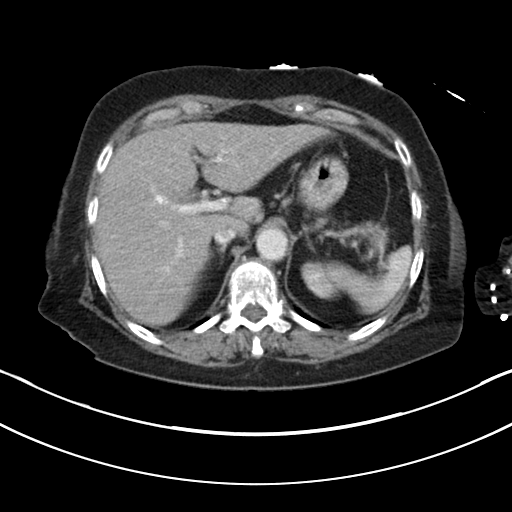
[im 71/84  soft-tissue]
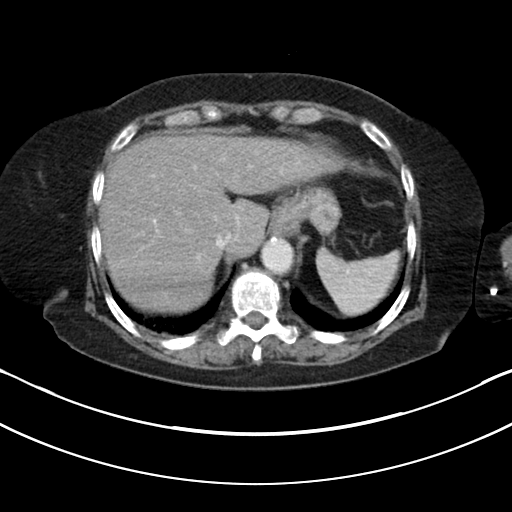
[im 79/84  soft-tissue]
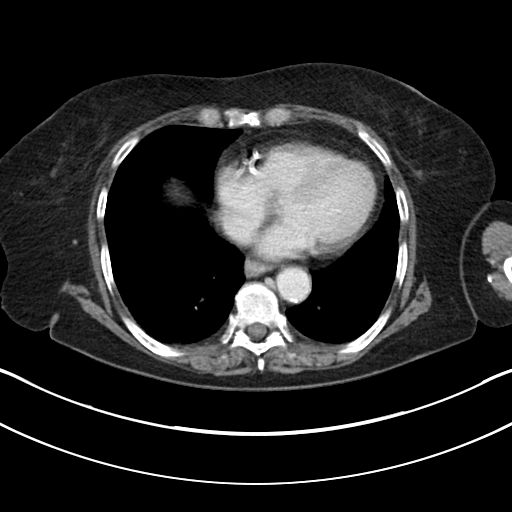

[Series 5: coronal soft tissue · coronal · 0.77mm/px · 3 of 71 slices shown]
[im 24/71  soft-tissue]
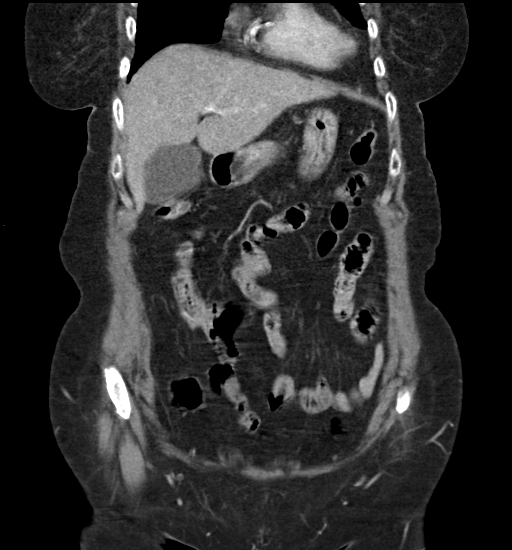
[im 32/71  soft-tissue]
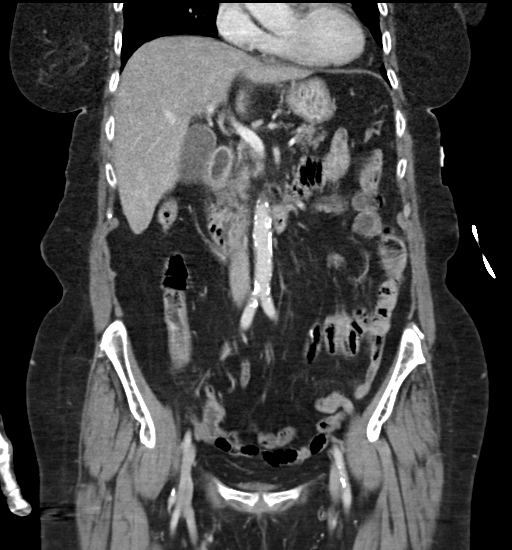
[im 39/71  soft-tissue]
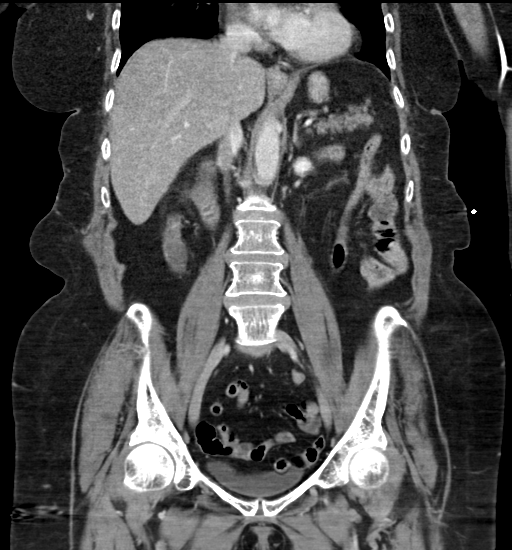

[16 of 46 positions shown; findings below may reference images not displayed]

FINDINGS: Minimal bibasilar dependent atelectatic changes of the lungs. The
visualized lung bases are otherwise clear. There is coronary
vascular calcification. No intra-abdominal free air or free fluid.

Mild apparent fatty infiltration of the liver. Focal high
attenuating area at the gallbladder fundus may represent stone or
focal adenomyomatosis. Ultrasound may provide better evaluation of
the gallbladder. No pericholecystic fluid. The pancreas, spleen,
adrenal glands, left kidney appear unremarkable. Subcentimeter right
renal hypodense lesions are too small to characterize but appears
stable compared to prior study and likely represent cysts.
Ultrasound may provide better characterization. No hydronephrosis.
The visualized ureters and urinary bladder appear unremarkable.
Hysterectomy.

There is postsurgical changes of partial colon resection with
anastomotic suture line in the sigmoid colon. There is no evidence
of bowel obstruction or active inflammation. R The appendix is not
visualized with certainty. No inflammatory changes identified in the
right lower quadrant.

There is moderate aortoiliac atherosclerotic disease. The origins of
the celiac axis, SMA, IMA as well as the origins of the renal
arteries appear patent. The SMV, splenic vein, and main portal veins
are patent. No portal venous gas identified. There is no adenopathy.
There is a midline vertical anterior pelvic wall incisional scar.
The abdominal wall soft tissues are otherwise unremarkable. There is
osteopenia with degenerative changes of the spine. No acute
fracture.
IMPRESSION: Focal adenomyomatosis of the gallbladder fundus versus gallstone.
Ultrasound may provide better evaluation of the gallbladder. No
other acute intra-abdominal or pelvic pathology identified.

## 2017-04-10 ENCOUNTER — Ambulatory Visit: Payer: Self-pay | Admitting: Endocrinology
